# Patient Record
Sex: Female | Born: 1970
Health system: Southern US, Community
[De-identification: ages and names within clinical notes are randomized; demographics above are authoritative.]

## PROBLEM LIST (undated history)

## (undated) DIAGNOSIS — T7840XA Allergy, unspecified, initial encounter: Secondary | ICD-10-CM

## (undated) DIAGNOSIS — N3944 Nocturnal enuresis: Secondary | ICD-10-CM

## (undated) DIAGNOSIS — F99 Mental disorder, not otherwise specified: Secondary | ICD-10-CM

## (undated) DIAGNOSIS — Z5189 Encounter for other specified aftercare: Secondary | ICD-10-CM

## (undated) DIAGNOSIS — IMO0001 Reserved for inherently not codable concepts without codable children: Secondary | ICD-10-CM

## (undated) DIAGNOSIS — F319 Bipolar disorder, unspecified: Secondary | ICD-10-CM

## (undated) DIAGNOSIS — J45909 Unspecified asthma, uncomplicated: Secondary | ICD-10-CM

## (undated) DIAGNOSIS — M1990 Unspecified osteoarthritis, unspecified site: Secondary | ICD-10-CM

## (undated) DIAGNOSIS — F419 Anxiety disorder, unspecified: Secondary | ICD-10-CM

## (undated) DIAGNOSIS — K859 Acute pancreatitis without necrosis or infection, unspecified: Secondary | ICD-10-CM

## (undated) DIAGNOSIS — N390 Urinary tract infection, site not specified: Secondary | ICD-10-CM

## (undated) DIAGNOSIS — R51 Headache: Secondary | ICD-10-CM

## (undated) DIAGNOSIS — J40 Bronchitis, not specified as acute or chronic: Secondary | ICD-10-CM

## (undated) DIAGNOSIS — F32A Depression, unspecified: Secondary | ICD-10-CM

## (undated) DIAGNOSIS — G4730 Sleep apnea, unspecified: Secondary | ICD-10-CM

## (undated) DIAGNOSIS — A4902 Methicillin resistant Staphylococcus aureus infection, unspecified site: Secondary | ICD-10-CM

## (undated) DIAGNOSIS — J069 Acute upper respiratory infection, unspecified: Secondary | ICD-10-CM

## (undated) DIAGNOSIS — L509 Urticaria, unspecified: Secondary | ICD-10-CM

## (undated) DIAGNOSIS — K219 Gastro-esophageal reflux disease without esophagitis: Secondary | ICD-10-CM

## (undated) DIAGNOSIS — N12 Tubulo-interstitial nephritis, not specified as acute or chronic: Secondary | ICD-10-CM

## (undated) DIAGNOSIS — L309 Dermatitis, unspecified: Secondary | ICD-10-CM

## (undated) DIAGNOSIS — N189 Chronic kidney disease, unspecified: Secondary | ICD-10-CM

## (undated) DIAGNOSIS — R0602 Shortness of breath: Secondary | ICD-10-CM

## (undated) DIAGNOSIS — D571 Sickle-cell disease without crisis: Secondary | ICD-10-CM

## (undated) DIAGNOSIS — F329 Major depressive disorder, single episode, unspecified: Secondary | ICD-10-CM

## (undated) DIAGNOSIS — I10 Essential (primary) hypertension: Secondary | ICD-10-CM

## (undated) DIAGNOSIS — F1910 Other psychoactive substance abuse, uncomplicated: Secondary | ICD-10-CM

## (undated) DIAGNOSIS — F1020 Alcohol dependence, uncomplicated: Secondary | ICD-10-CM

## (undated) DIAGNOSIS — D573 Sickle-cell trait: Secondary | ICD-10-CM

## (undated) HISTORY — PX: TUBAL LIGATION: SHX77

## (undated) HISTORY — DX: Unspecified osteoarthritis, unspecified site: M19.90

## (undated) HISTORY — DX: Allergy, unspecified, initial encounter: T78.40XA

## (undated) HISTORY — DX: Essential (primary) hypertension: I10

## (undated) HISTORY — DX: Nocturnal enuresis: N39.44

## (undated) HISTORY — DX: Urticaria, unspecified: L50.9

## (undated) HISTORY — PX: UPPER GASTROINTESTINAL ENDOSCOPY: SHX188

## (undated) HISTORY — DX: Acute pancreatitis without necrosis or infection, unspecified: K85.90

## (undated) HISTORY — PX: COLONOSCOPY: SHX174

## (undated) HISTORY — DX: Sickle-cell disease without crisis: D57.1

## (undated) HISTORY — DX: Unspecified asthma, uncomplicated: J45.909

## (undated) HISTORY — DX: Alcohol dependence, uncomplicated: F10.20

## (undated) HISTORY — DX: Other psychoactive substance abuse, uncomplicated: F19.10

## (undated) HISTORY — DX: Dermatitis, unspecified: L30.9

---

## 1998-02-06 ENCOUNTER — Emergency Department (HOSPITAL_COMMUNITY): Admission: EM | Admit: 1998-02-06 | Discharge: 1998-02-06 | Payer: Self-pay | Admitting: Emergency Medicine

## 2000-05-30 ENCOUNTER — Encounter: Payer: Self-pay | Admitting: Emergency Medicine

## 2000-05-30 ENCOUNTER — Emergency Department (HOSPITAL_COMMUNITY): Admission: EM | Admit: 2000-05-30 | Discharge: 2000-05-30 | Payer: Self-pay | Admitting: Emergency Medicine

## 2002-03-10 ENCOUNTER — Emergency Department (HOSPITAL_COMMUNITY): Admission: EM | Admit: 2002-03-10 | Discharge: 2002-03-10 | Payer: Self-pay | Admitting: Emergency Medicine

## 2002-03-15 ENCOUNTER — Inpatient Hospital Stay (HOSPITAL_COMMUNITY): Admission: EM | Admit: 2002-03-15 | Discharge: 2002-03-18 | Payer: Self-pay | Admitting: Psychiatry

## 2002-11-19 ENCOUNTER — Emergency Department (HOSPITAL_COMMUNITY): Admission: EM | Admit: 2002-11-19 | Discharge: 2002-11-20 | Payer: Self-pay | Admitting: Emergency Medicine

## 2002-12-10 ENCOUNTER — Emergency Department (HOSPITAL_COMMUNITY): Admission: EM | Admit: 2002-12-10 | Discharge: 2002-12-10 | Payer: Self-pay | Admitting: Emergency Medicine

## 2003-02-13 ENCOUNTER — Emergency Department (HOSPITAL_COMMUNITY): Admission: EM | Admit: 2003-02-13 | Discharge: 2003-02-13 | Payer: Self-pay | Admitting: Emergency Medicine

## 2003-03-06 ENCOUNTER — Emergency Department (HOSPITAL_COMMUNITY): Admission: AC | Admit: 2003-03-06 | Discharge: 2003-03-06 | Payer: Self-pay

## 2004-05-16 ENCOUNTER — Emergency Department (HOSPITAL_COMMUNITY): Admission: EM | Admit: 2004-05-16 | Discharge: 2004-05-16 | Payer: Self-pay | Admitting: Emergency Medicine

## 2008-07-14 ENCOUNTER — Emergency Department (HOSPITAL_COMMUNITY): Admission: EM | Admit: 2008-07-14 | Discharge: 2008-07-15 | Payer: Self-pay | Admitting: Emergency Medicine

## 2008-07-29 ENCOUNTER — Emergency Department (HOSPITAL_COMMUNITY): Admission: EM | Admit: 2008-07-29 | Discharge: 2008-07-29 | Payer: Self-pay | Admitting: Emergency Medicine

## 2008-08-23 ENCOUNTER — Emergency Department (HOSPITAL_COMMUNITY): Admission: EM | Admit: 2008-08-23 | Discharge: 2008-08-23 | Payer: Self-pay | Admitting: Emergency Medicine

## 2008-09-25 ENCOUNTER — Emergency Department (HOSPITAL_COMMUNITY): Admission: EM | Admit: 2008-09-25 | Discharge: 2008-09-25 | Payer: Self-pay | Admitting: Emergency Medicine

## 2009-03-05 DIAGNOSIS — A4902 Methicillin resistant Staphylococcus aureus infection, unspecified site: Secondary | ICD-10-CM

## 2009-03-05 HISTORY — DX: Methicillin resistant Staphylococcus aureus infection, unspecified site: A49.02

## 2009-05-20 ENCOUNTER — Ambulatory Visit (HOSPITAL_BASED_OUTPATIENT_CLINIC_OR_DEPARTMENT_OTHER): Admission: RE | Admit: 2009-05-20 | Discharge: 2009-05-20 | Payer: Self-pay | Admitting: Internal Medicine

## 2009-05-31 ENCOUNTER — Ambulatory Visit: Payer: Self-pay | Admitting: Internal Medicine

## 2009-08-19 ENCOUNTER — Emergency Department (HOSPITAL_COMMUNITY): Admission: EM | Admit: 2009-08-19 | Discharge: 2009-08-19 | Payer: Self-pay | Admitting: Emergency Medicine

## 2010-02-08 ENCOUNTER — Encounter
Admission: RE | Admit: 2010-02-08 | Discharge: 2010-02-08 | Payer: Self-pay | Source: Home / Self Care | Admitting: Internal Medicine

## 2010-03-30 ENCOUNTER — Emergency Department (HOSPITAL_COMMUNITY)
Admission: EM | Admit: 2010-03-30 | Discharge: 2010-03-30 | Payer: Self-pay | Source: Home / Self Care | Admitting: Emergency Medicine

## 2010-03-30 LAB — RAPID STREP SCREEN (MED CTR MEBANE ONLY): Streptococcus, Group A Screen (Direct): NEGATIVE

## 2010-05-21 LAB — URINE MICROSCOPIC-ADD ON

## 2010-05-21 LAB — DIFFERENTIAL
Basophils Absolute: 0 10*3/uL (ref 0.0–0.1)
Basophils Relative: 0 % (ref 0–1)
Eosinophils Absolute: 0 10*3/uL (ref 0.0–0.7)
Eosinophils Relative: 0 % (ref 0–5)
Lymphocytes Relative: 10 % — ABNORMAL LOW (ref 12–46)
Lymphs Abs: 1.5 10*3/uL (ref 0.7–4.0)
Monocytes Absolute: 1.3 10*3/uL — ABNORMAL HIGH (ref 0.1–1.0)
Monocytes Relative: 9 % (ref 3–12)
Neutro Abs: 11.8 10*3/uL — ABNORMAL HIGH (ref 1.7–7.7)
Neutrophils Relative %: 81 % — ABNORMAL HIGH (ref 43–77)

## 2010-05-21 LAB — POCT I-STAT, CHEM 8
BUN: 5 mg/dL — ABNORMAL LOW (ref 6–23)
Calcium, Ion: 1.11 mmol/L — ABNORMAL LOW (ref 1.12–1.32)
Chloride: 104 mEq/L (ref 96–112)
Creatinine, Ser: 0.9 mg/dL (ref 0.4–1.2)
Glucose, Bld: 98 mg/dL (ref 70–99)
HCT: 42 % (ref 36.0–46.0)
Hemoglobin: 14.3 g/dL (ref 12.0–15.0)
Potassium: 3.6 mEq/L (ref 3.5–5.1)
Sodium: 136 mEq/L (ref 135–145)
TCO2: 24 mmol/L (ref 0–100)

## 2010-05-21 LAB — WET PREP, GENITAL
Trich, Wet Prep: NONE SEEN
Yeast Wet Prep HPF POC: NONE SEEN

## 2010-05-21 LAB — CBC
HCT: 40.2 % (ref 36.0–46.0)
Hemoglobin: 13.5 g/dL (ref 12.0–15.0)
MCHC: 33.5 g/dL (ref 30.0–36.0)
MCV: 89.2 fL (ref 78.0–100.0)
Platelets: 246 10*3/uL (ref 150–400)
RBC: 4.51 MIL/uL (ref 3.87–5.11)
RDW: 14.5 % (ref 11.5–15.5)
WBC: 14.6 10*3/uL — ABNORMAL HIGH (ref 4.0–10.5)

## 2010-05-21 LAB — URINALYSIS, ROUTINE W REFLEX MICROSCOPIC
Glucose, UA: NEGATIVE mg/dL
Ketones, ur: 15 mg/dL — AB
Nitrite: POSITIVE — AB
Protein, ur: 30 mg/dL — AB
Specific Gravity, Urine: 1.02 (ref 1.005–1.030)
Urobilinogen, UA: 4 mg/dL — ABNORMAL HIGH (ref 0.0–1.0)
pH: 6 (ref 5.0–8.0)

## 2010-05-21 LAB — GC/CHLAMYDIA PROBE AMP, GENITAL
Chlamydia, DNA Probe: NEGATIVE
GC Probe Amp, Genital: NEGATIVE

## 2010-05-21 LAB — POCT PREGNANCY, URINE: Preg Test, Ur: NEGATIVE

## 2010-06-11 LAB — WOUND CULTURE

## 2010-06-13 LAB — URINALYSIS, ROUTINE W REFLEX MICROSCOPIC
Bilirubin Urine: NEGATIVE
Glucose, UA: NEGATIVE mg/dL
Hgb urine dipstick: NEGATIVE
Ketones, ur: 15 mg/dL — AB
Nitrite: NEGATIVE
Protein, ur: NEGATIVE mg/dL
Specific Gravity, Urine: 1.03 (ref 1.005–1.030)
Urobilinogen, UA: 1 mg/dL (ref 0.0–1.0)
pH: 7 (ref 5.0–8.0)

## 2010-06-13 LAB — URINE CULTURE: Colony Count: 35000

## 2010-06-13 LAB — PREGNANCY, URINE: Preg Test, Ur: NEGATIVE

## 2010-06-13 LAB — URINE MICROSCOPIC-ADD ON

## 2010-07-21 NOTE — Discharge Summary (Signed)
NAME:  [name], [name] NO.:  [id number]   MEDICAL RECORD NO.:  [id number]                   PATIENT TYPE:  IPS   LOCATION:  0503                                 FACILITY:  [facility]   PHYSICIAN:  Geoffery Lyons, M.D.                   DATE OF BIRTH:  [birth year]   DATE OF ADMISSION:  03/15/2002  DATE OF DISCHARGE:  03/18/2002                                 DISCHARGE SUMMARY   HISTORY OF PRESENT ILLNESS:  This was the first admission to the [facility] for this 40 year old single [place]  American female. She was voluntarily admitted with a history of intentional  overdose taking 10 Strattera which were prescribed for her son. She  apparently was by herself and then changed her mind and called her cousin  who called her mother and took her to the emergency room. Her stressors were  having to move, pain, in debt and decreased sleep, irritability, she said  she was unable to cope.   PAST PSYCHIATRIC HISTORY:  First time in at [facility].  Currently at [facility] for outpatient treatment.   ALCOHOL AND DRUG HISTORY:  Denies the use or abuse of any substances. 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   PAST MEDICAL HISTORY:  Folliculitis.   MEDICATIONS:  Keflex 250 q.i.d.   PHYSICAL EXAMINATION:  No acute findings.   MENTAL STATUS EXAM:  This is an alert, disheveled, overweight African  American female, cooperative. Good eye contact. Speech clear. Goal directed.  Mood depressed, affect flat. Thought process coherent. No evidence of  delusional idea. No auditory or visual hallucinations. No suicidal or  homicidal ideations.   DIAGNOSES:   AXIS I:  Depressive disorder, not otherwise specified.   AXIS II:  No diagnosis.   AXIS III:  Folliculitis.   AXIS IV:  Moderate.   AXIS V:  Global assessment of functioning on admission 60.   HOSPITAL COURSE:  She admitted to me that she had been  feeling increasingly  more depressed and stressed out, and she had made an intake appointment with  the [facility]. Latest stressors, she was recently  released from prison and got her kids back. She says that she had intended  to  die. There was also something having to do with relations with a fiance.  Depressed, unemployed, little support, family conflict.   She was started on Lexapro and worked on Optician, dispensing. There was a family session with the boyfriend. It went well.   She continued to improve as we increased the medication and she continued to  talk and work on Pharmacologist. On January 14, she was in good contact with  reality, was better with the stressors, better coping skills, stress  management. The antidepressant was helping and she was willing and motivated  to pursue further outpatient treatment upon discharge. Her mood improved,  affect brighter, no suicidal or homicidal ideations.   DISCHARGE DIAGNOSES:   AXIS I:  Depressive disorder, not otherwise specified.   AXIS II:  No diagnosis.   AXIS III:  Folliculitis.   AXIS IV:  Moderate.   AXIS V:  Upon discharge 55, 60.   DISCHARGE MEDICATIONS:  1. Lexapro 10 mg q.d.  2. Ambien 10 q.h.s. for sleep.   FOLLOW UP:  She will follow up with [facility].                                               Geoffery Lyons, M.D.    IL/MEDQ  D:  04/16/2002  T:  04/16/2002  Job:  [id number]

## 2010-07-21 NOTE — H&P (Signed)
NAME:  [name], [name]                        ACCOUNT NO.:  [id number]   MEDICAL RECORD NO.:  [id number]                   PATIENT TYPE:  IPS   LOCATION:  0503                                 FACILITY:  [facility]   PHYSICIAN:  Geoffery Lyons, M.D.                   DATE OF BIRTH:  1971/02/03   DATE OF ADMISSION:  03/15/2002  DATE OF DISCHARGE:                         PSYCHIATRIC ADMISSION ASSESSMENT   IDENTIFYING INFORMATION:  A 41 year old single African-American female  voluntarily admitted on March 15, 2002.   HISTORY OF PRESENT ILLNESS:  The patient presents with a history of  intentional overdose, taking approximately 10 Strattera tablets.  It was her  son's medications.  Took it at home.  She states no one was there.  She did  it to make herself less stressed.  She states that it was not a suicide  attempt.  She did call her cousins who then called her mother, then the  patient was taken to the emergency department.  The patient stressed over  having to move.  She is now evicted and concerned about paying deposits for  upcoming housing.  Her sleep has been decreased.  Her appetite has been  fair.  She reports mood swings described as irritability, no evidence of  hypomania episodes.  Continues to feel very depressed.  Has an upcoming  appointment with mental health.  She currently denies any psychotic  symptoms.   PAST PSYCHIATRIC HISTORY:  First hospitalization [facility],  no history of a suicide attempt.  Has an appointment at [facility] on January 28.   SOCIAL HISTORY:  A 40 year old single African-American female with 4  children, 16, 15, 55, and 64.  Children are currently living with a cousin  while she is hospitalized.  The patient was released from prison on October 14, 2001, for forgery, was in prison for 1 year.  She is on probation for 3  years.   FAMILY HISTORY:  None.   ALCOHOL DRUG HISTORY:  The patient smokes.  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   PAST MEDICAL HISTORY:  Primary care Relena Ivancic is none.  Medical problems  folliculitis.   MEDICATIONS:  Keflex 250 mg q.i.d., on a 7 day course.   DRUG ALLERGIES:  PENICILLIN.   PHYSICAL EXAMINATION:  Performed at [facility].  The patient appears as an  overweight female in no acute distress.  Her vital signs 97.3, 95 heart  rate, blood pressure is 135/77.  She is 5 feet 7 inches tall, she is 214  pounds.   LABORATORY DATA:  Urine drug screen was negative.  Hemoglobin 16, hematocrit  47.  Her CMET is within normal limits.  Her acetaminophen level less than  10, salicylate level is less than 4.   MENTAL STATUS EXAM:  She is an alert,  disheveled, overweight African-  American female.  She is cooperative, with good eye contact.  Speech is  clear, mood is depressed, affect is flat.  Thought processes are coherent.  There is no evidence of psychosis, no auditory or visual hallucinations,  suicidal or homicidal ideation.  Cognitive function intact.  Memory is fair,  judgment and insight is fair.    ADMISSION DIAGNOSES:   AXIS I:  Depressive disorder not otherwise specified.   AXIS II:  Deferred.   AXIS III:  Folliculitis.   AXIS IV:  Problems with primary support group, housing, and other  psychosocial problems.   AXIS V:  Current is 30, this past year 67.   PLAN:  Voluntary admission for intentional overdose.  Contract for safety,  check every 15 minutes.  Will initiate an antidepressant.  Medication  compliance was discussed with the patient.  The patient is to increase  coping skills by attending groups.  Stabilize mood and thinking so the  patient can be safe, to follow up with mental health.   TENTATIVE LENGTH OF CARE:  3-4 days.      Landry Corporal, N.P.                       Geoffery Lyons, M.D.    JO/MEDQ  D:  03/18/2002  T:  03/18/2002  Job:  [id number]

## 2010-08-03 ENCOUNTER — Emergency Department (HOSPITAL_COMMUNITY)
Admission: EM | Admit: 2010-08-03 | Discharge: 2010-08-03 | Disposition: A | Discharge status: Home / Self Care | Payer: Self-pay | Attending: Emergency Medicine | Admitting: Emergency Medicine

## 2010-08-03 DIAGNOSIS — F17200 Nicotine dependence, unspecified, uncomplicated: Secondary | ICD-10-CM | POA: Insufficient documentation

## 2010-08-03 DIAGNOSIS — R599 Enlarged lymph nodes, unspecified: Secondary | ICD-10-CM | POA: Insufficient documentation

## 2010-08-03 DIAGNOSIS — IMO0001 Reserved for inherently not codable concepts without codable children: Secondary | ICD-10-CM | POA: Insufficient documentation

## 2010-08-03 DIAGNOSIS — R070 Pain in throat: Secondary | ICD-10-CM | POA: Insufficient documentation

## 2010-08-03 DIAGNOSIS — R6889 Other general symptoms and signs: Secondary | ICD-10-CM | POA: Insufficient documentation

## 2010-08-03 DIAGNOSIS — J40 Bronchitis, not specified as acute or chronic: Secondary | ICD-10-CM | POA: Insufficient documentation

## 2010-08-03 DIAGNOSIS — J3489 Other specified disorders of nose and nasal sinuses: Secondary | ICD-10-CM | POA: Insufficient documentation

## 2010-08-03 DIAGNOSIS — R05 Cough: Secondary | ICD-10-CM | POA: Insufficient documentation

## 2010-08-03 DIAGNOSIS — F319 Bipolar disorder, unspecified: Secondary | ICD-10-CM | POA: Insufficient documentation

## 2010-08-03 DIAGNOSIS — R059 Cough, unspecified: Secondary | ICD-10-CM | POA: Insufficient documentation

## 2010-08-13 ENCOUNTER — Emergency Department (HOSPITAL_COMMUNITY)
Admission: EM | Admit: 2010-08-13 | Discharge: 2010-08-14 | Discharge status: Against Medical Advice | Payer: Self-pay | Attending: Emergency Medicine | Admitting: Emergency Medicine

## 2010-08-13 DIAGNOSIS — R109 Unspecified abdominal pain: Secondary | ICD-10-CM | POA: Insufficient documentation

## 2010-08-13 DIAGNOSIS — M549 Dorsalgia, unspecified: Secondary | ICD-10-CM | POA: Insufficient documentation

## 2010-08-14 ENCOUNTER — Emergency Department (HOSPITAL_COMMUNITY)
Admission: EM | Admit: 2010-08-14 | Discharge: 2010-08-14 | Disposition: A | Discharge status: Home / Self Care | Payer: Self-pay | Attending: Emergency Medicine | Admitting: Emergency Medicine

## 2010-08-14 ENCOUNTER — Emergency Department (HOSPITAL_COMMUNITY): Payer: Self-pay

## 2010-08-14 DIAGNOSIS — N898 Other specified noninflammatory disorders of vagina: Secondary | ICD-10-CM | POA: Insufficient documentation

## 2010-08-14 DIAGNOSIS — Z79899 Other long term (current) drug therapy: Secondary | ICD-10-CM | POA: Insufficient documentation

## 2010-08-14 DIAGNOSIS — R1915 Other abnormal bowel sounds: Secondary | ICD-10-CM | POA: Insufficient documentation

## 2010-08-14 DIAGNOSIS — F3130 Bipolar disorder, current episode depressed, mild or moderate severity, unspecified: Secondary | ICD-10-CM | POA: Insufficient documentation

## 2010-08-14 DIAGNOSIS — R109 Unspecified abdominal pain: Secondary | ICD-10-CM | POA: Insufficient documentation

## 2010-08-14 DIAGNOSIS — R110 Nausea: Secondary | ICD-10-CM | POA: Insufficient documentation

## 2010-08-14 LAB — DIFFERENTIAL
Basophils Absolute: 0 10*3/uL (ref 0.0–0.1)
Basophils Relative: 1 % (ref 0–1)
Eosinophils Absolute: 0.2 10*3/uL (ref 0.0–0.7)
Eosinophils Relative: 2 % (ref 0–5)
Lymphocytes Relative: 48 % — ABNORMAL HIGH (ref 12–46)
Lymphs Abs: 3.3 10*3/uL (ref 0.7–4.0)
Monocytes Absolute: 0.4 10*3/uL (ref 0.1–1.0)
Monocytes Relative: 5 % (ref 3–12)
Neutro Abs: 3 10*3/uL (ref 1.7–7.7)
Neutrophils Relative %: 44 % (ref 43–77)

## 2010-08-14 LAB — CBC
HCT: 39.8 % (ref 36.0–46.0)
Hemoglobin: 13.6 g/dL (ref 12.0–15.0)
MCH: 28.9 pg (ref 26.0–34.0)
MCHC: 34.2 g/dL (ref 30.0–36.0)
MCV: 84.7 fL (ref 78.0–100.0)
Platelets: 302 10*3/uL (ref 150–400)
RBC: 4.7 MIL/uL (ref 3.87–5.11)
RDW: 13.1 % (ref 11.5–15.5)
WBC: 6.8 10*3/uL (ref 4.0–10.5)

## 2010-08-14 LAB — URINALYSIS, ROUTINE W REFLEX MICROSCOPIC
Bilirubin Urine: NEGATIVE
Glucose, UA: NEGATIVE mg/dL
Hgb urine dipstick: NEGATIVE
Ketones, ur: NEGATIVE mg/dL
Leukocytes, UA: NEGATIVE
Nitrite: NEGATIVE
Protein, ur: NEGATIVE mg/dL
Specific Gravity, Urine: 1.025 (ref 1.005–1.030)
Urobilinogen, UA: 1 mg/dL (ref 0.0–1.0)
pH: 5.5 (ref 5.0–8.0)

## 2010-08-14 LAB — BASIC METABOLIC PANEL
BUN: 7 mg/dL (ref 6–23)
CO2: 26 mEq/L (ref 19–32)
Calcium: 8.8 mg/dL (ref 8.4–10.5)
Chloride: 108 mEq/L (ref 96–112)
Creatinine, Ser: 0.76 mg/dL (ref 0.4–1.2)
GFR calc Af Amer: >60 mL/min (ref >60)
GFR calc non Af Amer: >60 mL/min (ref >60)
Glucose, Bld: 104 mg/dL — ABNORMAL HIGH (ref 70–99)
Potassium: 3.5 mEq/L (ref 3.5–5.1)
Sodium: 141 mEq/L (ref 135–145)

## 2010-08-14 LAB — WET PREP, GENITAL
Trich, Wet Prep: NONE SEEN
Yeast Wet Prep HPF POC: NONE SEEN

## 2010-08-14 LAB — POCT PREGNANCY, URINE: Preg Test, Ur: NEGATIVE

## 2010-08-15 LAB — GC/CHLAMYDIA PROBE AMP, GENITAL
Chlamydia, DNA Probe: NEGATIVE
GC Probe Amp, Genital: NEGATIVE

## 2010-10-03 ENCOUNTER — Emergency Department (HOSPITAL_COMMUNITY)
Admission: EM | Admit: 2010-10-03 | Discharge: 2010-10-03 | Disposition: A | Discharge status: Home / Self Care | Payer: Self-pay | Attending: Emergency Medicine | Admitting: Emergency Medicine

## 2010-10-03 DIAGNOSIS — F3130 Bipolar disorder, current episode depressed, mild or moderate severity, unspecified: Secondary | ICD-10-CM | POA: Insufficient documentation

## 2010-10-03 DIAGNOSIS — R3911 Hesitancy of micturition: Secondary | ICD-10-CM | POA: Insufficient documentation

## 2010-10-03 DIAGNOSIS — M545 Low back pain, unspecified: Secondary | ICD-10-CM | POA: Insufficient documentation

## 2010-10-03 DIAGNOSIS — R300 Dysuria: Secondary | ICD-10-CM | POA: Insufficient documentation

## 2010-10-03 DIAGNOSIS — Z79899 Other long term (current) drug therapy: Secondary | ICD-10-CM | POA: Insufficient documentation

## 2010-10-03 DIAGNOSIS — R3915 Urgency of urination: Secondary | ICD-10-CM | POA: Insufficient documentation

## 2010-10-03 DIAGNOSIS — R350 Frequency of micturition: Secondary | ICD-10-CM | POA: Insufficient documentation

## 2010-10-03 LAB — URINALYSIS, ROUTINE W REFLEX MICROSCOPIC
Bilirubin Urine: NEGATIVE
Glucose, UA: NEGATIVE mg/dL
Hgb urine dipstick: NEGATIVE
Ketones, ur: NEGATIVE mg/dL
Nitrite: NEGATIVE
Protein, ur: NEGATIVE mg/dL
Specific Gravity, Urine: 1.02 (ref 1.005–1.030)
Urobilinogen, UA: 0.2 mg/dL (ref 0.0–1.0)
pH: 5.5 (ref 5.0–8.0)

## 2010-10-03 LAB — URINE MICROSCOPIC-ADD ON

## 2010-10-03 LAB — PREGNANCY, URINE: Preg Test, Ur: NEGATIVE

## 2010-10-04 DIAGNOSIS — N12 Tubulo-interstitial nephritis, not specified as acute or chronic: Secondary | ICD-10-CM

## 2010-10-04 DIAGNOSIS — N390 Urinary tract infection, site not specified: Secondary | ICD-10-CM

## 2010-10-04 HISTORY — DX: Urinary tract infection, site not specified: N39.0

## 2010-10-04 HISTORY — DX: Tubulo-interstitial nephritis, not specified as acute or chronic: N12

## 2010-10-04 LAB — URINE CULTURE
Colony Count: NO GROWTH
Culture  Setup Time: 201207311434
Culture: NO GROWTH

## 2011-01-19 ENCOUNTER — Emergency Department (HOSPITAL_COMMUNITY)
Admission: EM | Admit: 2011-01-19 | Discharge: 2011-01-19 | Disposition: A | Discharge status: Home / Self Care | Payer: Self-pay | Attending: Emergency Medicine | Admitting: Emergency Medicine

## 2011-01-19 DIAGNOSIS — X58XXXA Exposure to other specified factors, initial encounter: Secondary | ICD-10-CM | POA: Insufficient documentation

## 2011-01-19 DIAGNOSIS — S0502XA Injury of conjunctiva and corneal abrasion without foreign body, left eye, initial encounter: Secondary | ICD-10-CM

## 2011-01-19 DIAGNOSIS — H5789 Other specified disorders of eye and adnexa: Secondary | ICD-10-CM | POA: Insufficient documentation

## 2011-01-19 DIAGNOSIS — S058X9A Other injuries of unspecified eye and orbit, initial encounter: Secondary | ICD-10-CM | POA: Insufficient documentation

## 2011-01-19 DIAGNOSIS — H5710 Ocular pain, unspecified eye: Secondary | ICD-10-CM | POA: Insufficient documentation

## 2011-01-19 HISTORY — DX: Bronchitis, not specified as acute or chronic: J40

## 2011-01-19 MED ORDER — ERYTHROMYCIN 2 % EX OINT: TOPICAL_OINTMENT | CUTANEOUS | Status: DC

## 2011-01-19 MED ORDER — HYDROCODONE-ACETAMINOPHEN 5-325 MG PO TABS: 1.0000 | ORAL_TABLET | ORAL | Status: AC | PRN

## 2011-01-19 MED ORDER — TETRACAINE HCL 0.5 % OP SOLN
1.0000 [drp] | Freq: Once | OPHTHALMIC | Status: AC
  Administered 2011-01-19: 1 [drp] via OPHTHALMIC
  Filled 2011-01-19: qty 2

## 2011-01-19 MED ORDER — FLUORESCEIN SODIUM 1 MG OP STRP
1.0000 | ORAL_STRIP | Freq: Once | OPHTHALMIC | Status: AC
  Administered 2011-01-19: 1 via OPHTHALMIC
  Filled 2011-01-19: qty 1

## 2011-01-19 NOTE — ED Notes (Signed)
Pt verbalized understanding of written  discharge instructions and follow up appointment. Given prescriptions. Left before e signature obtained.

## 2011-01-19 NOTE — ED Notes (Signed)
Pt discharged at 1445; discharge notes written later.

## 2011-01-19 NOTE — ED Provider Notes (Signed)
History     CSN: [id number] Arrival date & time: 01/19/2011  9:50 AM   First MD Initiated Contact with Patient 01/19/11 1109      Chief Complaint  Patient presents with  . Eye Pain   HPI Patient presents to emergency room with complaint of left eye redness and pain that started this morning. Patient reports that she does not wear contact lenses. Denies any trauma or injury to the eye. Patient denies any headaches. Denies any diplopia. Denies any other complaints.   Past Medical History  Diagnosis Date  . Bronchitis     Past Surgical History  Procedure Date  . Cesarean section     History reviewed. No pertinent family history.  History  Substance Use Topics  . Smoking status: Current Everyday Smoker  . Smokeless tobacco: Not on file  . Alcohol Use: Yes    OB History    Grav Para Term Preterm Abortions TAB SAB Ect Mult Living                  Review of Systems  Constitutional: Negative for fever, chills, diaphoresis and appetite change.  HENT: Negative for neck pain.   Eyes: Positive for photophobia, pain and redness. Negative for discharge, itching and visual disturbance.  Respiratory: Negative for cough, chest tightness and shortness of breath.   Cardiovascular: Negative for chest pain.  Gastrointestinal: Negative for nausea, vomiting and abdominal pain.  Genitourinary: Negative for flank pain.  Musculoskeletal: Negative for back pain.  Skin: Negative for rash.  Neurological: Negative for weakness and numbness.  All other systems reviewed and are negative.    Allergies  Aspirin; Penicillins; and Septra  Home Medications   Current Outpatient Rx  Name Route Sig Dispense Refill  . LAMOTRIGINE 100 MG PO TABS Oral Take 100 mg by mouth daily.      . TRAZODONE HCL 100 MG PO TABS Oral Take 50 mg by mouth at bedtime.      Marland Kitchen ZOLPIDEM TARTRATE 10 MG PO TABS Oral Take 10 mg by mouth at bedtime.        BP 123/60  Pulse 74  Temp(Src) 98.4 F (36.9 C) (Oral)   Resp 22  SpO2 98%  LMP 12/20/2010  Physical Exam  Nursing note and vitals reviewed. Constitutional: She appears well-developed and well-nourished. No distress.  HENT:  Head: Normocephalic and atraumatic.  Eyes: EOM are normal. Pupils are equal, round, and reactive to light. Right eye exhibits no discharge. Left eye exhibits no discharge. Right conjunctiva is not injected. Left conjunctiva is injected. Left conjunctiva has no hemorrhage. No scleral icterus. Right eye exhibits normal extraocular motion and no nystagmus. Left eye exhibits normal extraocular motion and no nystagmus. Right pupil is round and reactive. Left pupil is round and reactive. Pupils are equal.    Skin: She is not diaphoretic.    ED Course  Procedures (including critical care time)  Patient seen and evaluated.  VSS reviewed. . Nursing notes reviewed.  Initial testing ordered. Will monitor the patient closely. They agree with the treatment plan and diagnosis. Fluorescein and propocaine applied, with woods lamp. Showed corneal abrasion. No foreign body seen.  2:03 PM patient to be discharged. Stressed importance of following up with an opthalmologic and warning signs to return. Stated agreement and understanding. No consensual eye pain.  MDM  Left corneal abrasion        Demetrius Charity, PA 01/19/11 1404

## 2011-01-19 NOTE — ED Provider Notes (Signed)
Medical screening examination/treatment/procedure(s) were performed by non-physician practitioner and as supervising physician I was immediately available for consultation/collaboration.  Ethelda Chick, MD 01/19/11 [id number]

## 2011-01-19 NOTE — ED Provider Notes (Signed)
Patient is a 40 year old female who presents for evaluation of left eye redness and irritation and reporting that "it feels like something is in my". She will cut this morning with a red and injected left eye with some drainage, but no known trauma to the. She denies any other symptoms such as severe pain, nasal congestion, sore throat, cough, or fever. She denies any known sick contacts. On examination her left eye shows injected conjunctiva with some tearing. Grossly examining the eye there is no apparent foreign body. The patient will however need to be moved back to the eye exam for more detailed examination and evaluation of the.  Felisa Bonier, MD 01/19/11 1121

## 2011-01-19 NOTE — ED Notes (Signed)
Patient presents with redness and pain to left eye since this AM with swelling. Patient also reporting blurred vision.

## 2011-02-25 ENCOUNTER — Emergency Department (HOSPITAL_COMMUNITY)
Admission: EM | Admit: 2011-02-25 | Discharge: 2011-02-26 | Disposition: A | Discharge status: Home / Self Care | Payer: Self-pay | Attending: Emergency Medicine | Admitting: Emergency Medicine

## 2011-02-25 DIAGNOSIS — M25469 Effusion, unspecified knee: Secondary | ICD-10-CM | POA: Insufficient documentation

## 2011-02-25 DIAGNOSIS — Z79899 Other long term (current) drug therapy: Secondary | ICD-10-CM | POA: Insufficient documentation

## 2011-02-25 DIAGNOSIS — M25569 Pain in unspecified knee: Secondary | ICD-10-CM | POA: Insufficient documentation

## 2011-02-25 DIAGNOSIS — R000 Tachycardia, unspecified: Secondary | ICD-10-CM | POA: Insufficient documentation

## 2011-02-25 DIAGNOSIS — R269 Unspecified abnormalities of gait and mobility: Secondary | ICD-10-CM | POA: Insufficient documentation

## 2011-02-25 DIAGNOSIS — Y92009 Unspecified place in unspecified non-institutional (private) residence as the place of occurrence of the external cause: Secondary | ICD-10-CM | POA: Insufficient documentation

## 2011-02-25 DIAGNOSIS — W010XXA Fall on same level from slipping, tripping and stumbling without subsequent striking against object, initial encounter: Secondary | ICD-10-CM | POA: Insufficient documentation

## 2011-02-25 DIAGNOSIS — K219 Gastro-esophageal reflux disease without esophagitis: Secondary | ICD-10-CM | POA: Insufficient documentation

## 2011-02-25 DIAGNOSIS — F411 Generalized anxiety disorder: Secondary | ICD-10-CM | POA: Insufficient documentation

## 2011-02-25 DIAGNOSIS — S82143A Displaced bicondylar fracture of unspecified tibia, initial encounter for closed fracture: Secondary | ICD-10-CM

## 2011-02-25 DIAGNOSIS — S82109A Unspecified fracture of upper end of unspecified tibia, initial encounter for closed fracture: Secondary | ICD-10-CM | POA: Insufficient documentation

## 2011-02-25 DIAGNOSIS — F319 Bipolar disorder, unspecified: Secondary | ICD-10-CM | POA: Insufficient documentation

## 2011-02-25 HISTORY — DX: Bipolar disorder, unspecified: F31.9

## 2011-02-25 HISTORY — DX: Sleep apnea, unspecified: G47.30

## 2011-02-25 HISTORY — DX: Gastro-esophageal reflux disease without esophagitis: K21.9

## 2011-02-26 ENCOUNTER — Emergency Department (HOSPITAL_COMMUNITY): Payer: Self-pay

## 2011-02-26 ENCOUNTER — Encounter (HOSPITAL_COMMUNITY): Payer: Self-pay | Admitting: *Deleted

## 2011-02-26 MED ORDER — ONDANSETRON 4 MG PO TBDP
4.0000 mg | ORAL_TABLET | Freq: Once | ORAL | Status: AC
Start: 2011-02-26 — End: 2011-02-26
  Administered 2011-02-26: 4 mg via ORAL
  Filled 2011-02-26: qty 1

## 2011-02-26 MED ORDER — OXYCODONE-ACETAMINOPHEN 5-325 MG PO TABS: 1.0000 | ORAL_TABLET | ORAL | Status: AC | PRN

## 2011-02-26 MED ORDER — OXYCODONE-ACETAMINOPHEN 5-325 MG PO TABS
2.0000 | ORAL_TABLET | Freq: Once | ORAL | Status: AC
  Administered 2011-02-26: 2 via ORAL
  Filled 2011-02-26: qty 2

## 2011-02-26 MED ORDER — HYDROMORPHONE HCL PF 2 MG/ML IJ SOLN
2.0000 mg | Freq: Once | INTRAMUSCULAR | Status: AC
  Administered 2011-02-26: 2 mg via INTRAMUSCULAR
  Filled 2011-02-26 (×2): qty 1

## 2011-02-26 NOTE — ED Provider Notes (Signed)
Medical screening examination/treatment/procedure(s) were performed by non-physician practitioner and as supervising physician I was immediately available for consultation/collaboration.   Ruairi Stutsman, MD 02/26/11 0656 

## 2011-02-26 NOTE — ED Notes (Addendum)
C/o leg pain, fell at ~ 2330, "just slipped, leg went behind her", pinpoints pain to just above L knee and down to mid tib./fib, pt crying & guarding. Pain rated at 10/10. No meds PTA. CMS intact. No obvious deformity.

## 2011-02-26 NOTE — ED Provider Notes (Signed)
History     CSN: [id number]  Arrival date & time 02/25/11  2334   First MD Initiated Contact with Patient 02/26/11 0057      Chief Complaint  Patient presents with  . Leg Pain  . Fall    (Consider location/radiation/quality/duration/timing/severity/associated sxs/prior treatment) HPI Comments: Patient states she was wearing her bedroom slippers walking in her home.  That has hardwood floors slipped left leg twisted behind her, now she's having pain from mid tib-fib through the knee to distal femur.  States she's unable to bear weight or to move her legs on her own.  This happened just prior to arrival, she's taken no home medications  Patient is a 40 y.o. female presenting with leg pain and fall. The history is provided by the patient.  Leg Pain  The incident occurred less than 1 hour ago. The incident occurred at home. The injury mechanism was a fall. The pain is present in the left leg. The quality of the pain is described as throbbing. The pain is at a severity of 10/10. The pain is severe. The pain has been constant since onset. Associated symptoms include inability to bear weight. Pertinent negatives include no numbness. The symptoms are aggravated by activity. She has tried nothing for the symptoms.  Fall Pertinent negatives include no numbness.    Past Medical History  Diagnosis Date  . Bronchitis   . Chronic bipolar disorder   . GERD (gastroesophageal reflux disease)   . Sleep apnea     Past Surgical History  Procedure Date  . Cesarean section   . Cesarean section   . Tubal ligation     Family History  Problem Relation Age of Onset  . Thyroid disease Father   . Diabetes Other   . Cancer Other     History  Substance Use Topics  . Smoking status: Current Everyday Smoker -- 0.5 packs/day  . Smokeless tobacco: Not on file  . Alcohol Use: Yes    OB History    Grav Para Term Preterm Abortions TAB SAB Ect Mult Living                  Review of Systems    HENT: Negative.   Eyes: Negative.   Respiratory: Negative.   Cardiovascular: Negative.   Gastrointestinal: Negative.   Genitourinary: Negative.   Musculoskeletal: Positive for joint swelling and gait problem. Negative for back pain.  Neurological: Negative for numbness.  Psychiatric/Behavioral: Negative.     Allergies  Aspirin; Penicillins; and Septra  Home Medications   Current Outpatient Rx  Name Route Sig Dispense Refill  . LAMOTRIGINE 100 MG PO TABS Oral Take 100 mg by mouth daily.      . TRAZODONE HCL 100 MG PO TABS Oral Take 50 mg by mouth at bedtime.      Marland Kitchen ZOLPIDEM TARTRATE 10 MG PO TABS Oral Take 10 mg by mouth at bedtime.      . OXYCODONE-ACETAMINOPHEN 5-325 MG PO TABS Oral Take 1 tablet by mouth every 4 (four) hours as needed for pain. 30 tablet 0    BP 111/54  Pulse 77  Temp 97.3 F (36.3 C)  Resp 20  SpO2 100%  LMP 01/21/2011  Physical Exam  Constitutional: She is oriented to person, place, and time. She appears well-developed and well-nourished.  HENT:  Head: Normocephalic.  Neck: Normal range of motion.  Cardiovascular: Tachycardia present.   Musculoskeletal:       Left knee: She exhibits decreased range  of motion and swelling. She exhibits no erythema. tenderness found.  Neurological: She is oriented to person, place, and time.  Skin: Skin is warm and dry.  Psychiatric: Her mood appears anxious.    ED Course  Procedures (including critical care time)  Labs Reviewed - No data to display Dg Tibia/fibula Left  02/26/2011  *RADIOLOGY REPORT*  Clinical Data: Trauma, fall, pain and swelling at knee  LEFT TIBIA AND FIBULA - 2 VIEW  Comparison: None  Findings: Ankle joint alignment normal. Depressed intra-articular fracture lateral tibial plateau. No additional fracture or dislocation identified.  IMPRESSION: Depressed left lateral tibial plateau fracture.  Original Report Authenticated By: Lollie Marrow, M.D.   Dg Knee Complete 4 Views  Left  02/26/2011  *RADIOLOGY REPORT*  Clinical Data: Pain and swelling, fall  LEFT KNEE - COMPLETE 4+ VIEW  Comparison: None.  Findings: Bones appear mildly demineralized. Comminuted depressed and displaced fracture of lateral tibial plateau. Widening of lateral compartment. No additional fracture, dislocation or bone destruction. Joint effusion with fat fluid level.  IMPRESSION: Depressed and displaced lateral tibial plateau fracture. Associated lipohemarthrosis.  Original Report Authenticated By: Lollie Marrow, M.D.     1. Tibial plateau fracture      I spoke with Dr. Genella Mech, who reviewed.  Ms. Tozer x-ray, he is requesting a CT scan through the knee pain medication, immobilization and office followup on December 26 MDM  We'll x-ray to evaluate for potential fracture provided pain medication        Arman Filter, NP 02/26/11 0114  Arman Filter, NP 02/26/11 0304  Arman Filter, NP 02/26/11 [phone]

## 2011-03-02 ENCOUNTER — Emergency Department (HOSPITAL_COMMUNITY)
Admission: EM | Admit: 2011-03-02 | Discharge: 2011-03-02 | Disposition: A | Discharge status: Home / Self Care | Payer: Self-pay | Attending: Emergency Medicine | Admitting: Emergency Medicine

## 2011-03-02 ENCOUNTER — Encounter (HOSPITAL_COMMUNITY): Payer: Self-pay

## 2011-03-02 DIAGNOSIS — M79609 Pain in unspecified limb: Secondary | ICD-10-CM | POA: Insufficient documentation

## 2011-03-02 DIAGNOSIS — F17200 Nicotine dependence, unspecified, uncomplicated: Secondary | ICD-10-CM | POA: Insufficient documentation

## 2011-03-02 DIAGNOSIS — Z4789 Encounter for other orthopedic aftercare: Secondary | ICD-10-CM | POA: Insufficient documentation

## 2011-03-02 DIAGNOSIS — S82143A Displaced bicondylar fracture of unspecified tibia, initial encounter for closed fracture: Secondary | ICD-10-CM

## 2011-03-02 DIAGNOSIS — R609 Edema, unspecified: Secondary | ICD-10-CM | POA: Insufficient documentation

## 2011-03-02 DIAGNOSIS — R110 Nausea: Secondary | ICD-10-CM

## 2011-03-02 MED ORDER — ONDANSETRON 4 MG PO TBDP
8.0000 mg | ORAL_TABLET | Freq: Once | ORAL | Status: AC
  Administered 2011-03-02: 8 mg via ORAL
  Filled 2011-03-02: qty 2

## 2011-03-02 MED ORDER — OXYCODONE-ACETAMINOPHEN 5-325 MG PO TABS
2.0000 | ORAL_TABLET | Freq: Once | ORAL | Status: AC
  Administered 2011-03-02: 2 via ORAL
  Filled 2011-03-02: qty 2

## 2011-03-02 MED ORDER — ONDANSETRON HCL 4 MG PO TABS: 4.0000 mg | ORAL_TABLET | Freq: Three times a day (TID) | ORAL | Status: AC | PRN

## 2011-03-02 MED ORDER — OXYCODONE-ACETAMINOPHEN 7.5-325 MG PO TABS: ORAL_TABLET | ORAL | Status: DC

## 2011-03-02 NOTE — Discharge Instructions (Signed)
Narcotic and benzodiazepine use may cause drowsiness, slowed breathing or dependence.  Please use with caution and do not drive, operate machinery or watch young children alone while taking them.  Taking combinations of these medications or drinking alcohol will potentiate these effects.    

## 2011-03-02 NOTE — ED Notes (Signed)
Patient here for continued pain in left leg, dx with fx here on Sunday. sts has persistant pain from knee radiaiting to left hip, sts has bad sweats last several mins from time to time and also has problems using the crutches. Pt has immobilizer on left knee at present. Attempted to follow Elisha Headland but sts that his office called back and told her there was nothing they could do and she did not need to follow upwith them.

## 2011-03-02 NOTE — Progress Notes (Signed)
Paged by Marland Mcalpine to provide patient assistance in obtaining a wheelchair. I contacted Darion of [facility] who provided the patient with the wheelchair and instructions.

## 2011-03-02 NOTE — ED Notes (Signed)
Ortho Tech at bedside.  

## 2011-03-02 NOTE — ED Notes (Signed)
Pt. Was here on Tuesday and diagnosed with a rt. Broken tibula.  Pt. Has not been able to follow-up with orthopedic and she is having nausea, pain has not changed and also periods of sweating.

## 2011-03-02 NOTE — ED Provider Notes (Signed)
History     CSN: [id number]  Arrival date & time 03/02/11  4098   First MD Initiated Contact with Patient 03/02/11 1011      Chief Complaint  Patient presents with  . Extremity Pain    (Consider location/radiation/quality/duration/timing/severity/associated sxs/prior treatment) HPI Comments: Pt was seen last Sunday and was found to have a left knee fracture.  She was given crutches, knee immobilizer and told to follow up with Dr. Lestine Box with [facility].  She did call and then was told to cancel her appt by ortho and to contact Dr. Carola Frost instead.  She has called Dr. Carola Frost and has appt this Tuesday, in 4 days.  She reports continued intermittent nausea since her injury, also she is taking percocet, but pain is only partially helped.  She denies constipation.  She also has questions about her knee brace that she feels it is not fitting completely correctly.  And with her crutches, she endorses difficulty getting around attributing to generlized weakness to right leg which is not a new problem, but due to this, she is banging her left knee into things.  No new discoloration, numbness, or new injury.  She has some pain in left hip that seems to radiate up from her knee.  Denies new weakness or back pain.    Patient is a 40 y.o. female presenting with extremity pain. The history is provided by the patient.  Extremity Pain Pertinent negatives include no shortness of breath.    Past Medical History  Diagnosis Date  . Bronchitis   . Chronic bipolar disorder   . GERD (gastroesophageal reflux disease)   . Sleep apnea     Past Surgical History  Procedure Date  . Cesarean section   . Cesarean section   . Tubal ligation     Family History  Problem Relation Age of Onset  . Thyroid disease Father   . Diabetes Other   . Cancer Other     History  Substance Use Topics  . Smoking status: Current Everyday Smoker -- 0.5 packs/day  . Smokeless tobacco: Not on file  . Alcohol Use: Yes     OB History    Grav Para Term Preterm Abortions TAB SAB Ect Mult Living                  Review of Systems  Constitutional: Negative.   Respiratory: Negative for shortness of breath.   Gastrointestinal: Positive for nausea. Negative for vomiting.  Musculoskeletal: Positive for joint swelling and arthralgias.  Skin: Negative for rash and wound.  Neurological: Negative for weakness and numbness.    Allergies  Aspirin; Penicillins; and Septra  Home Medications   Current Outpatient Rx  Name Route Sig Dispense Refill  . LAMOTRIGINE 100 MG PO TABS Oral Take 100 mg by mouth daily.      . OXYCODONE-ACETAMINOPHEN 5-325 MG PO TABS Oral Take 1 tablet by mouth every 4 (four) hours as needed for pain. 30 tablet 0  . TRAZODONE HCL 100 MG PO TABS Oral Take 50 mg by mouth at bedtime.      Marland Kitchen ZOLPIDEM TARTRATE 10 MG PO TABS Oral Take 10 mg by mouth at bedtime.        BP 155/76  Pulse 81  Temp(Src) 98.4 F (36.9 C) (Oral)  Resp 22  Ht 5\' 7"  (1.702 m)  Wt 215 lb (97.523 kg)  BMI 33.67 kg/m2  SpO2 100%  LMP 01/21/2011  Physical Exam  Nursing note and vitals reviewed.  Constitutional: She appears well-developed and well-nourished.  HENT:  Head: Normocephalic and atraumatic.  Pulmonary/Chest: Effort normal.  Abdominal: Soft.  Musculoskeletal: She exhibits edema and tenderness.       Legs:      Pt is in sitting position with no sig discomfort noted to left hip - likely some referred pain to hip from knee fracture  Neurological: She is alert.       5/5 plantar flexion of left ankle  Skin: Skin is warm and dry. No rash noted.    ED Course  Procedures (including critical care time)  Labs Reviewed - No data to display No results found.   No diagnosis found.    MDM  Will prescribe nausea meds along with higher dose oxycodone.  Will prescribe wheelchair for pt.  Pt has appt with Dr. Carola Frost on Tuesday according to patient.  Ortho tech to see pt and re-address size of knee  immobilizer with patient.  I reviewed knee films from last week.  Reinforced need for ice and elevation while at home.         Gavin Pound. Suzi Hernan, MD 03/02/11 1027

## 2011-03-02 NOTE — ED Notes (Signed)
Returned a call to SW about pt request with help for receiving a wheelchair.

## 2011-03-05 ENCOUNTER — Encounter (HOSPITAL_COMMUNITY): Payer: Self-pay | Admitting: Pharmacy Technician

## 2011-03-07 ENCOUNTER — Encounter (HOSPITAL_COMMUNITY): Payer: Self-pay | Admitting: *Deleted

## 2011-03-07 MED ORDER — CHLORHEXIDINE GLUCONATE 4 % EX LIQD: 60.0000 mL | Freq: Once | CUTANEOUS | Status: DC

## 2011-03-07 MED ORDER — VANCOMYCIN HCL IN DEXTROSE 1-5 GM/200ML-% IV SOLN
1000.0000 mg | INTRAVENOUS | Status: AC
  Administered 2011-03-08: 1000 mg via INTRAVENOUS
  Filled 2011-03-07: qty 200

## 2011-03-07 NOTE — H&P (Signed)
Orthopaedic Trauma Service H&P Chief Complaint: L knee pain s/p fall HPI: Ms. [name] is a 41 y/o AA female who sustained a ground level fall on 02/26/2011 in her kitchen.  Pt reportedly slipped on water. Immediate onset of pain and inability to bear weight.  Went to ER for evaluation and was found to have a L tibial plateau fracture.  Pt was instructed to f/u with orthopaedics as an outpatient but was unable to get an appointment with the on call group.  As such pt followed up with OTS for evaluation.  She was found to have a fracture requiring operative intervention.  Past Medical History  Diagnosis Date  . Bronchitis   . Chronic bipolar disorder   . GERD (gastroesophageal reflux disease)   . Sleep apnea     Past Surgical History  Procedure Date  . Cesarean section   . Cesarean section   . Tubal ligation     Family History  Problem Relation Age of Onset  . Thyroid disease Father   . Diabetes Other   . Cancer Other    Social History:  reports that she has been smoking.  She does not have any smokeless tobacco history on file. She reports that she drinks alcohol. She reports that she does not use illicit drugs.  Allergies:  Allergies  Allergen Reactions  . Aspirin     "chilhood allergy"  . Penicillins Swelling  . Septra (Bactrim) Itching and Swelling    No current facility-administered medications on file as of .   Medications Prior to Admission  Medication Sig Dispense Refill  . lamoTRIgine (LAMICTAL) 100 MG tablet Take 100 mg by mouth daily.        . ondansetron (ZOFRAN) 4 MG tablet Take 1 tablet (4 mg total) by mouth every 8 (eight) hours as needed for nausea.  15 tablet  0  . oxyCODONE-acetaminophen (PERCOCET) 5-325 MG per tablet Take 1 tablet by mouth every 4 (four) hours as needed for pain.  30 tablet  0  . traZODone (DESYREL) 100 MG tablet Take 50 mg by mouth at bedtime.        Marland Kitchen zolpidem (AMBIEN) 10 MG tablet Take 10 mg by mouth at bedtime.          No results  found for this or any previous visit (from the past 48 hour(s)). No results found.  Review of Systems  Constitutional: Negative.   HENT: Negative.   Eyes: Negative.   Respiratory: Negative for cough and shortness of breath.   Cardiovascular: Negative for chest pain and palpitations.  Gastrointestinal: Negative for nausea, vomiting and abdominal pain.  Genitourinary: Negative for dysuria.  Skin: Negative.   Neurological: Positive for tingling.       Dorsum of L foot  Endo/Heme/Allergies: Negative.     Last menstrual period 01/21/2011. Physical Exam  Constitutional: She is oriented to person, place, and time. She appears well-developed and well-nourished. No distress.  HENT:  Head: Normocephalic and atraumatic.  Mouth/Throat: Oropharynx is clear and moist.  Eyes: EOM are normal.  Neck: Normal range of motion. Neck supple.  Cardiovascular: Normal rate and regular rhythm.   No murmur heard. Respiratory: Effort normal and breath sounds normal. She has no wheezes. She has no rales.  GI: Soft. Bowel sounds are normal. She exhibits no distension. There is no tenderness.  Musculoskeletal:       Left Lower Extremity       Hip and ankle without acute findings  TTP proximal L tibia       Stability not assessed for L knee secondary to known fracture       Distal motor and sensory functions intact        Extremity is warm       + DP pulse        Compartments are soft and nontender       No pain with passive stretch       Swelling well controlled  Neurological: She is alert and oriented to person, place, and time.  Skin: Skin is warm and dry.    Xray an CT scan      Split depressed fracture of L lateral tibial plateau  Assessment/Plan  41 y/o AA female s/p fall with shatzker II L tibial plateau fracture  OR for ORIF L tibial plateau Will admit after surgery for pain control and therapy Would expect 2-3 hospitalization post op NWB x 6 weeks, begin ROM L knee post op day 2 in  hinged brace   Nancy Latin, PA-C 03/07/2011, 12:03 PM

## 2011-03-08 ENCOUNTER — Encounter (HOSPITAL_COMMUNITY): Payer: Self-pay | Admitting: Anesthesiology

## 2011-03-08 ENCOUNTER — Inpatient Hospital Stay (HOSPITAL_COMMUNITY)
Admission: RE | Admit: 2011-03-08 | Discharge: 2011-03-10 | DRG: 488 | Disposition: A | Discharge status: Home Health | Payer: Self-pay | Source: Ambulatory Visit | Attending: Orthopedic Surgery | Admitting: Orthopedic Surgery

## 2011-03-08 ENCOUNTER — Ambulatory Visit (HOSPITAL_COMMUNITY): Payer: Self-pay | Admitting: Anesthesiology

## 2011-03-08 ENCOUNTER — Ambulatory Visit (HOSPITAL_COMMUNITY): Payer: Self-pay

## 2011-03-08 ENCOUNTER — Encounter (HOSPITAL_COMMUNITY)
Admission: RE | Disposition: A | Discharge status: Home / Self Care | Payer: Self-pay | Source: Ambulatory Visit | Attending: Orthopedic Surgery

## 2011-03-08 ENCOUNTER — Encounter (HOSPITAL_COMMUNITY): Payer: Self-pay | Admitting: *Deleted

## 2011-03-08 DIAGNOSIS — Z6833 Body mass index (BMI) 33.0-33.9, adult: Secondary | ICD-10-CM

## 2011-03-08 DIAGNOSIS — F17200 Nicotine dependence, unspecified, uncomplicated: Secondary | ICD-10-CM

## 2011-03-08 DIAGNOSIS — Y998 Other external cause status: Secondary | ICD-10-CM

## 2011-03-08 DIAGNOSIS — Z01818 Encounter for other preprocedural examination: Secondary | ICD-10-CM

## 2011-03-08 DIAGNOSIS — Y92009 Unspecified place in unspecified non-institutional (private) residence as the place of occurrence of the external cause: Secondary | ICD-10-CM

## 2011-03-08 DIAGNOSIS — F319 Bipolar disorder, unspecified: Secondary | ICD-10-CM

## 2011-03-08 DIAGNOSIS — G4730 Sleep apnea, unspecified: Secondary | ICD-10-CM | POA: Diagnosis present

## 2011-03-08 DIAGNOSIS — G4733 Obstructive sleep apnea (adult) (pediatric): Secondary | ICD-10-CM

## 2011-03-08 DIAGNOSIS — S82109A Unspecified fracture of upper end of unspecified tibia, initial encounter for closed fracture: Principal | ICD-10-CM | POA: Diagnosis present

## 2011-03-08 DIAGNOSIS — Z01810 Encounter for preprocedural cardiovascular examination: Secondary | ICD-10-CM

## 2011-03-08 DIAGNOSIS — Z23 Encounter for immunization: Secondary | ICD-10-CM

## 2011-03-08 DIAGNOSIS — S82143A Displaced bicondylar fracture of unspecified tibia, initial encounter for closed fracture: Secondary | ICD-10-CM

## 2011-03-08 DIAGNOSIS — K219 Gastro-esophageal reflux disease without esophagitis: Secondary | ICD-10-CM

## 2011-03-08 DIAGNOSIS — E669 Obesity, unspecified: Secondary | ICD-10-CM

## 2011-03-08 DIAGNOSIS — W010XXA Fall on same level from slipping, tripping and stumbling without subsequent striking against object, initial encounter: Secondary | ICD-10-CM | POA: Diagnosis present

## 2011-03-08 DIAGNOSIS — E871 Hypo-osmolality and hyponatremia: Secondary | ICD-10-CM | POA: Diagnosis not present

## 2011-03-08 DIAGNOSIS — S83289A Other tear of lateral meniscus, current injury, unspecified knee, initial encounter: Secondary | ICD-10-CM | POA: Diagnosis present

## 2011-03-08 HISTORY — DX: Chronic kidney disease, unspecified: N18.9

## 2011-03-08 HISTORY — PX: ORIF TIBIA PLATEAU: SHX2132

## 2011-03-08 HISTORY — DX: Sickle-cell trait: D57.3

## 2011-03-08 HISTORY — DX: Depression, unspecified: F32.A

## 2011-03-08 HISTORY — DX: Tubulo-interstitial nephritis, not specified as acute or chronic: N12

## 2011-03-08 HISTORY — DX: Urinary tract infection, site not specified: N39.0

## 2011-03-08 HISTORY — DX: Major depressive disorder, single episode, unspecified: F32.9

## 2011-03-08 HISTORY — DX: Mental disorder, not otherwise specified: F99

## 2011-03-08 HISTORY — DX: Anxiety disorder, unspecified: F41.9

## 2011-03-08 LAB — CBC
HCT: 37.8 % (ref 36.0–46.0)
Hemoglobin: 13 g/dL (ref 12.0–15.0)
MCH: 29.5 pg (ref 26.0–34.0)
MCHC: 34.4 g/dL (ref 30.0–36.0)
MCV: 85.9 fL (ref 78.0–100.0)
Platelets: 337 10*3/uL (ref 150–400)
RBC: 4.4 MIL/uL (ref 3.87–5.11)
RDW: 13.8 % (ref 11.5–15.5)
WBC: 6.7 10*3/uL (ref 4.0–10.5)

## 2011-03-08 LAB — DIFFERENTIAL
Basophils Absolute: 0 10*3/uL (ref 0.0–0.1)
Basophils Relative: 1 % (ref 0–1)
Eosinophils Absolute: 0.1 10*3/uL (ref 0.0–0.7)
Eosinophils Relative: 2 % (ref 0–5)
Lymphocytes Relative: 34 % (ref 12–46)
Lymphs Abs: 2.3 10*3/uL (ref 0.7–4.0)
Monocytes Absolute: 0.5 10*3/uL (ref 0.1–1.0)
Monocytes Relative: 7 % (ref 3–12)
Neutro Abs: 3.8 10*3/uL (ref 1.7–7.7)
Neutrophils Relative %: 57 % (ref 43–77)

## 2011-03-08 LAB — COMPREHENSIVE METABOLIC PANEL
ALT: 7 U/L (ref 0–35)
AST: 13 U/L (ref 0–37)
Albumin: 3.4 g/dL — ABNORMAL LOW (ref 3.5–5.2)
Alkaline Phosphatase: 86 U/L (ref 39–117)
BUN: 9 mg/dL (ref 6–23)
CO2: 22 mEq/L (ref 19–32)
Calcium: 9.2 mg/dL (ref 8.4–10.5)
Chloride: 104 mEq/L (ref 96–112)
Creatinine, Ser: 0.7 mg/dL (ref 0.50–1.10)
GFR calc Af Amer: >90 mL/min (ref >90)
GFR calc non Af Amer: >90 mL/min (ref >90)
Glucose, Bld: 93 mg/dL (ref 70–99)
Potassium: 4.2 mEq/L (ref 3.5–5.1)
Sodium: 136 mEq/L (ref 135–145)
Total Bilirubin: 0.3 mg/dL (ref 0.3–1.2)
Total Protein: 7.3 g/dL (ref 6.0–8.3)

## 2011-03-08 LAB — APTT: aPTT: 32 seconds (ref 24–37)

## 2011-03-08 LAB — SURGICAL PCR SCREEN
MRSA, PCR: POSITIVE — AB
Staphylococcus aureus: POSITIVE — AB

## 2011-03-08 LAB — PROTIME-INR
INR: 1.09 (ref 0.00–1.49)
Prothrombin Time: 14.3 seconds (ref 11.6–15.2)

## 2011-03-08 LAB — TYPE AND SCREEN
ABO/RH(D): O POS
Antibody Screen: NEGATIVE

## 2011-03-08 LAB — ABO/RH: ABO/RH(D): O POS

## 2011-03-08 SURGERY — OPEN REDUCTION INTERNAL FIXATION (ORIF) TIBIAL PLATEAU
Anesthesia: General | Site: Leg Lower | Laterality: Left | Wound class: Clean

## 2011-03-08 MED ORDER — ACETAMINOPHEN 10 MG/ML IV SOLN
1000.0000 mg | Freq: Four times a day (QID) | INTRAVENOUS | Status: DC
  Administered 2011-03-08 – 2011-03-09 (×3): 1000 mg via INTRAVENOUS
  Filled 2011-03-08 (×7): qty 100

## 2011-03-08 MED ORDER — ONDANSETRON HCL 4 MG/2ML IJ SOLN
INTRAMUSCULAR | Status: DC | PRN
  Administered 2011-03-08: 4 mg via INTRAVENOUS

## 2011-03-08 MED ORDER — HYDROMORPHONE HCL PF 1 MG/ML IJ SOLN
0.5000 mg | INTRAMUSCULAR | Status: DC | PRN
  Administered 2011-03-09 – 2011-03-10 (×4): 1 mg via INTRAVENOUS
  Filled 2011-03-08 (×4): qty 1

## 2011-03-08 MED ORDER — FENTANYL CITRATE 0.05 MG/ML IJ SOLN
100.0000 ug | Freq: Once | INTRAMUSCULAR | Status: AC
  Administered 2011-03-08: 100 ug via INTRAVENOUS

## 2011-03-08 MED ORDER — ONDANSETRON HCL 4 MG/2ML IJ SOLN: 4.0000 mg | Freq: Once | INTRAMUSCULAR | Status: DC | PRN

## 2011-03-08 MED ORDER — LACTATED RINGERS IV SOLN
INTRAVENOUS | Status: DC
  Administered 2011-03-08: 09:00:00 via INTRAVENOUS

## 2011-03-08 MED ORDER — DOCUSATE SODIUM 100 MG PO CAPS
100.0000 mg | ORAL_CAPSULE | Freq: Two times a day (BID) | ORAL | Status: DC
  Administered 2011-03-08 – 2011-03-10 (×4): 100 mg via ORAL
  Filled 2011-03-08 (×6): qty 1

## 2011-03-08 MED ORDER — MAGNESIUM CITRATE PO SOLN
1.0000 | Freq: Once | ORAL | Status: AC | PRN
  Filled 2011-03-08: qty 296

## 2011-03-08 MED ORDER — MIDAZOLAM HCL 2 MG/2ML IJ SOLN: 0.5000 mg | Freq: Once | INTRAMUSCULAR | Status: DC | PRN

## 2011-03-08 MED ORDER — CEFAZOLIN SODIUM 1-5 GM-% IV SOLN
1.0000 g | Freq: Four times a day (QID) | INTRAVENOUS | Status: AC
  Administered 2011-03-08 – 2011-03-09 (×3): 1 g via INTRAVENOUS
  Filled 2011-03-08 (×4): qty 50

## 2011-03-08 MED ORDER — TRAZODONE HCL 50 MG PO TABS
50.0000 mg | ORAL_TABLET | Freq: Every day | ORAL | Status: DC
  Administered 2011-03-08 – 2011-03-09 (×2): 50 mg via ORAL
  Filled 2011-03-08 (×4): qty 1

## 2011-03-08 MED ORDER — DIPHENHYDRAMINE HCL 12.5 MG/5ML PO ELIX
12.5000 mg | ORAL_SOLUTION | Freq: Four times a day (QID) | ORAL | Status: DC | PRN
  Filled 2011-03-08: qty 5

## 2011-03-08 MED ORDER — NALOXONE HCL 0.4 MG/ML IJ SOLN: 0.4000 mg | INTRAMUSCULAR | Status: DC | PRN

## 2011-03-08 MED ORDER — SODIUM CHLORIDE 0.9 % IJ SOLN: 9.0000 mL | INTRAMUSCULAR | Status: DC | PRN

## 2011-03-08 MED ORDER — LAMOTRIGINE 100 MG PO TABS
100.0000 mg | ORAL_TABLET | Freq: Every day | ORAL | Status: DC
  Administered 2011-03-09 – 2011-03-10 (×2): 100 mg via ORAL
  Filled 2011-03-08 (×3): qty 1

## 2011-03-08 MED ORDER — PROPOFOL 10 MG/ML IV EMUL
INTRAVENOUS | Status: DC | PRN
  Administered 2011-03-08: 160 mg via INTRAVENOUS

## 2011-03-08 MED ORDER — DIPHENHYDRAMINE HCL 50 MG/ML IJ SOLN: 12.5000 mg | Freq: Four times a day (QID) | INTRAMUSCULAR | Status: DC | PRN

## 2011-03-08 MED ORDER — ONDANSETRON HCL 4 MG PO TABS: 4.0000 mg | ORAL_TABLET | Freq: Four times a day (QID) | ORAL | Status: DC | PRN

## 2011-03-08 MED ORDER — SODIUM CHLORIDE 0.9 % IV SOLN
INTRAVENOUS | Status: DC | PRN
  Administered 2011-03-08: 11:00:00 via INTRAVENOUS

## 2011-03-08 MED ORDER — GLYCOPYRROLATE 0.2 MG/ML IJ SOLN
INTRAMUSCULAR | Status: DC | PRN
  Administered 2011-03-08: .4 mg via INTRAVENOUS

## 2011-03-08 MED ORDER — METHOCARBAMOL 100 MG/ML IJ SOLN
500.0000 mg | Freq: Once | INTRAVENOUS | Status: AC
  Administered 2011-03-08: 500 mg via INTRAVENOUS
  Filled 2011-03-08: qty 5

## 2011-03-08 MED ORDER — METHOCARBAMOL 500 MG PO TABS
500.0000 mg | ORAL_TABLET | Freq: Four times a day (QID) | ORAL | Status: DC
  Administered 2011-03-08 – 2011-03-09 (×2): 500 mg via ORAL
  Administered 2011-03-09 – 2011-03-10 (×3): 1000 mg via ORAL
  Administered 2011-03-10 (×2): 500 mg via ORAL
  Filled 2011-03-08 (×3): qty 2
  Filled 2011-03-08: qty 1
  Filled 2011-03-08 (×3): qty 2
  Filled 2011-03-08: qty 1
  Filled 2011-03-08 (×2): qty 2
  Filled 2011-03-08: qty 1
  Filled 2011-03-08 (×2): qty 2
  Filled 2011-03-08: qty 1
  Filled 2011-03-08 (×2): qty 2

## 2011-03-08 MED ORDER — ZOLPIDEM TARTRATE 10 MG PO TABS
10.0000 mg | ORAL_TABLET | Freq: Every day | ORAL | Status: DC
  Administered 2011-03-08 – 2011-03-09 (×2): 10 mg via ORAL
  Filled 2011-03-08 (×2): qty 1

## 2011-03-08 MED ORDER — ONDANSETRON HCL 4 MG/2ML IJ SOLN: 4.0000 mg | Freq: Four times a day (QID) | INTRAMUSCULAR | Status: DC | PRN

## 2011-03-08 MED ORDER — MIDAZOLAM HCL 2 MG/2ML IJ SOLN
INTRAMUSCULAR | Status: AC
  Filled 2011-03-08: qty 2

## 2011-03-08 MED ORDER — NEOSTIGMINE METHYLSULFATE 1 MG/ML IJ SOLN
INTRAMUSCULAR | Status: DC | PRN
  Administered 2011-03-08: 2 mg via INTRAVENOUS

## 2011-03-08 MED ORDER — ENOXAPARIN SODIUM 40 MG/0.4ML ~~LOC~~ SOLN
40.0000 mg | SUBCUTANEOUS | Status: DC
Start: 2011-03-08 — End: 2011-03-10
  Administered 2011-03-08 – 2011-03-09 (×2): 40 mg via SUBCUTANEOUS
  Filled 2011-03-08 (×3): qty 0.4

## 2011-03-08 MED ORDER — MIDAZOLAM HCL 2 MG/2ML IJ SOLN
2.0000 mg | Freq: Once | INTRAMUSCULAR | Status: AC
  Administered 2011-03-08: 2 mg via INTRAVENOUS

## 2011-03-08 MED ORDER — MUPIROCIN 2 % EX OINT
TOPICAL_OINTMENT | CUTANEOUS | Status: AC
  Administered 2011-03-08: 08:00:00 via NASAL
  Filled 2011-03-08: qty 22

## 2011-03-08 MED ORDER — POTASSIUM CHLORIDE IN NACL 20-0.9 MEQ/L-% IV SOLN
INTRAVENOUS | Status: DC
  Administered 2011-03-08 – 2011-03-09 (×2): via INTRAVENOUS
  Filled 2011-03-08 (×4): qty 1000

## 2011-03-08 MED ORDER — OXYCODONE HCL 5 MG PO TABS
5.0000 mg | ORAL_TABLET | ORAL | Status: DC | PRN
  Administered 2011-03-09 (×2): 20 mg via ORAL
  Administered 2011-03-09: 10 mg via ORAL
  Administered 2011-03-09: 20 mg via ORAL
  Administered 2011-03-10: 15 mg via ORAL
  Administered 2011-03-10: 20 mg via ORAL
  Filled 2011-03-08: qty 2
  Filled 2011-03-08: qty 3
  Filled 2011-03-08: qty 2
  Filled 2011-03-08 (×3): qty 4
  Filled 2011-03-08: qty 2

## 2011-03-08 MED ORDER — MAGNESIUM HYDROXIDE 400 MG/5ML PO SUSP: 30.0000 mL | Freq: Every day | ORAL | Status: DC | PRN

## 2011-03-08 MED ORDER — HYDROMORPHONE 0.3 MG/ML IV SOLN
INTRAVENOUS | Status: DC
  Administered 2011-03-08: 0.6 mg via INTRAVENOUS
  Administered 2011-03-08: 15:00:00 via INTRAVENOUS
  Administered 2011-03-08: 3 mg via INTRAVENOUS
  Administered 2011-03-09: 4.8 mg via INTRAVENOUS
  Administered 2011-03-09: 02:00:00 via INTRAVENOUS
  Administered 2011-03-09: 2.4 mg via INTRAVENOUS
  Administered 2011-03-09: 2.1 mg via INTRAVENOUS
  Filled 2011-03-08 (×2): qty 25

## 2011-03-08 MED ORDER — FENTANYL CITRATE 0.05 MG/ML IJ SOLN
INTRAMUSCULAR | Status: AC
  Filled 2011-03-08: qty 2

## 2011-03-08 MED ORDER — METOCLOPRAMIDE HCL 10 MG PO TABS: 5.0000 mg | ORAL_TABLET | Freq: Three times a day (TID) | ORAL | Status: DC | PRN

## 2011-03-08 MED ORDER — ROCURONIUM BROMIDE 100 MG/10ML IV SOLN
INTRAVENOUS | Status: DC | PRN
  Administered 2011-03-08: 50 mg via INTRAVENOUS

## 2011-03-08 MED ORDER — HYDROMORPHONE HCL PF 1 MG/ML IJ SOLN
0.2500 mg | INTRAMUSCULAR | Status: DC | PRN
  Administered 2011-03-08 (×2): 0.5 mg via INTRAVENOUS

## 2011-03-08 MED ORDER — HYDROXYZINE HCL 25 MG PO TABS
25.0000 mg | ORAL_TABLET | Freq: Three times a day (TID) | ORAL | Status: DC | PRN
  Filled 2011-03-08: qty 1

## 2011-03-08 MED ORDER — LACTATED RINGERS IV SOLN
INTRAVENOUS | Status: DC | PRN
  Administered 2011-03-08 (×2): via INTRAVENOUS

## 2011-03-08 MED ORDER — 0.9 % SODIUM CHLORIDE (POUR BTL) OPTIME
TOPICAL | Status: DC | PRN
  Administered 2011-03-08: 1000 mL

## 2011-03-08 MED ORDER — METHOCARBAMOL 100 MG/ML IJ SOLN
500.0000 mg | Freq: Four times a day (QID) | INTRAVENOUS | Status: DC
  Administered 2011-03-08: 500 mg via INTRAVENOUS
  Filled 2011-03-08 (×6): qty 5

## 2011-03-08 MED ORDER — METOCLOPRAMIDE HCL 5 MG/ML IJ SOLN
5.0000 mg | Freq: Three times a day (TID) | INTRAMUSCULAR | Status: DC | PRN
  Filled 2011-03-08: qty 2

## 2011-03-08 MED ORDER — FENTANYL CITRATE 0.05 MG/ML IJ SOLN
INTRAMUSCULAR | Status: DC | PRN
  Administered 2011-03-08 (×3): 100 ug via INTRAVENOUS
  Administered 2011-03-08: 200 ug via INTRAVENOUS
  Administered 2011-03-08: 100 ug via INTRAVENOUS

## 2011-03-08 MED ORDER — BISACODYL 10 MG RE SUPP: 10.0000 mg | Freq: Every day | RECTAL | Status: DC | PRN

## 2011-03-08 SURGICAL SUPPLY — 84 items
7hole left proximal tibial plate ×2 IMPLANT
BANDAGE ELASTIC 4 VELCRO ST LF (GAUZE/BANDAGES/DRESSINGS) ×2 IMPLANT
BANDAGE ELASTIC 6 VELCRO ST LF (GAUZE/BANDAGES/DRESSINGS) ×2 IMPLANT
BANDAGE GAUZE ELAST BULKY 4 IN (GAUZE/BANDAGES/DRESSINGS) ×2 IMPLANT
BIT DRILL 100X2.5XANTM LCK (BIT) ×1 IMPLANT
BIT DRILL CAL (BIT) ×1 IMPLANT
BIT DRL 100X2.5XANTM LCK (BIT) ×1
BLADE SURG 10 STRL SS (BLADE) IMPLANT
BLADE SURG 15 STRL LF DISP TIS (BLADE) ×1 IMPLANT
BLADE SURG 15 STRL SS (BLADE) ×1
BLADE SURG ROTATE 9660 (MISCELLANEOUS) IMPLANT
BRUSH SCRUB DISP (MISCELLANEOUS) ×4 IMPLANT
CEMENT CAL PHOSPHATE 5CC (Cement) ×2 IMPLANT
CLEANER TIP ELECTROSURG 2X2 (MISCELLANEOUS) ×2 IMPLANT
CLOTH BEACON ORANGE TIMEOUT ST (SAFETY) ×2 IMPLANT
COVER MAYO STAND STRL (DRAPES) ×2 IMPLANT
DRAPE C-ARM 42X72 X-RAY (DRAPES) ×2 IMPLANT
DRAPE C-ARMOR (DRAPES) ×2 IMPLANT
DRAPE INCISE IOBAN 66X45 STRL (DRAPES) IMPLANT
DRAPE ORTHO SPLIT 77X108 STRL (DRAPES)
DRAPE SURG ORHT 6 SPLT 77X108 (DRAPES) IMPLANT
DRAPE U-SHAPE 47X51 STRL (DRAPES) ×2 IMPLANT
DRILL BIT 2.5MM (BIT) ×1
DRILL BIT CAL (BIT) ×2
DRSG ADAPTIC 3X8 NADH LF (GAUZE/BANDAGES/DRESSINGS) ×2 IMPLANT
DRSG PAD ABDOMINAL 8X10 ST (GAUZE/BANDAGES/DRESSINGS) ×8 IMPLANT
ELECT REM PT RETURN 9FT ADLT (ELECTROSURGICAL) ×2
ELECTRODE REM PT RTRN 9FT ADLT (ELECTROSURGICAL) ×1 IMPLANT
EVACUATOR 1/8 PVC DRAIN (DRAIN) IMPLANT
EVACUATOR 3/16  PVC DRAIN (DRAIN)
EVACUATOR 3/16 PVC DRAIN (DRAIN) IMPLANT
GLOVE BIO SURGEON STRL SZ7.5 (GLOVE) ×2 IMPLANT
GLOVE BIO SURGEON STRL SZ8 (GLOVE) IMPLANT
GLOVE BIOGEL PI IND STRL 7.0 (GLOVE) ×1 IMPLANT
GLOVE BIOGEL PI IND STRL 7.5 (GLOVE) ×1 IMPLANT
GLOVE BIOGEL PI IND STRL 8 (GLOVE) ×1 IMPLANT
GLOVE BIOGEL PI INDICATOR 7.0 (GLOVE) ×1
GLOVE BIOGEL PI INDICATOR 7.5 (GLOVE) ×1
GLOVE BIOGEL PI INDICATOR 8 (GLOVE) ×1
GLOVE SURG SS PI 6.5 STRL IVOR (GLOVE) ×4 IMPLANT
GOWN PREVENTION PLUS XLARGE (GOWN DISPOSABLE) ×2 IMPLANT
GOWN STRL NON-REIN LRG LVL3 (GOWN DISPOSABLE) ×4 IMPLANT
IMMOBILIZER KNEE 22 UNIV (SOFTGOODS) ×2 IMPLANT
K-WIRE ACE 1.6X6 (WIRE) ×12
KIT BASIN OR (CUSTOM PROCEDURE TRAY) ×2 IMPLANT
KIT ROOM TURNOVER OR (KITS) ×2 IMPLANT
KWIRE ACE 1.6X6 (WIRE) ×6 IMPLANT
MANIFOLD NEPTUNE II (INSTRUMENTS) ×2 IMPLANT
NEEDLE 22X1 1/2 (OR ONLY) (NEEDLE) IMPLANT
NS IRRIG 1000ML POUR BTL (IV SOLUTION) ×2 IMPLANT
PACK ORTHO EXTREMITY (CUSTOM PROCEDURE TRAY) ×2 IMPLANT
PAD ARMBOARD 7.5X6 YLW CONV (MISCELLANEOUS) ×4 IMPLANT
PAD CAST 4YDX4 CTTN HI CHSV (CAST SUPPLIES) ×1 IMPLANT
PADDING CAST COTTON 4X4 STRL (CAST SUPPLIES) ×1
PADDING CAST COTTON 6X4 STRL (CAST SUPPLIES) ×2 IMPLANT
SCREW CORTICAL 3.5MM 36MM (Screw) ×2 IMPLANT
SCREW CORTICAL 3.5MM 38MM (Screw) ×4 IMPLANT
SCREW LOCK CORT STAR 3.5X34 (Screw) ×2 IMPLANT
SCREW LOCK CORT STAR 3.5X65 (Screw) ×6 IMPLANT
SCREW LOCK CORT STAR 3.5X70 (Screw) ×2 IMPLANT
SCREW LOCK CORT STAR 3.5X75 (Screw) ×2 IMPLANT
SCREW LOW PROF CORTICAL 3.5X80 (Screw) ×2 IMPLANT
SCREW LP 3.5X70MM (Screw) ×2 IMPLANT
SPONGE GAUZE 4X4 12PLY (GAUZE/BANDAGES/DRESSINGS) ×2 IMPLANT
SPONGE LAP 18X18 X RAY DECT (DISPOSABLE) ×4 IMPLANT
STAPLER VISISTAT 35W (STAPLE) ×2 IMPLANT
STOCKINETTE IMPERVIOUS LG (DRAPES) ×2 IMPLANT
SUCTION FRAZIER TIP 10 FR DISP (SUCTIONS) ×2 IMPLANT
SUT ETHILON 3 0 PS 1 (SUTURE) IMPLANT
SUT PROLENE 0 CT 2 (SUTURE) ×2 IMPLANT
SUT VIC AB 0 CT1 27 (SUTURE) ×1
SUT VIC AB 0 CT1 27XBRD ANBCTR (SUTURE) ×1 IMPLANT
SUT VIC AB 1 CT1 27 (SUTURE) ×1
SUT VIC AB 1 CT1 27XBRD ANBCTR (SUTURE) ×1 IMPLANT
SUT VIC AB 2-0 CT1 27 (SUTURE) ×2
SUT VIC AB 2-0 CT1 TAPERPNT 27 (SUTURE) ×2 IMPLANT
SYR 20ML ECCENTRIC (SYRINGE) IMPLANT
TOWEL OR 17X24 6PK STRL BLUE (TOWEL DISPOSABLE) ×2 IMPLANT
TOWEL OR 17X26 10 PK STRL BLUE (TOWEL DISPOSABLE) ×4 IMPLANT
TRAY FOLEY BAG SILVER LF 14FR (CATHETERS) IMPLANT
TRAY FOLEY CATH 14FR (SET/KITS/TRAYS/PACK) IMPLANT
TUBE CONNECTING 12X1/4 (SUCTIONS) ×2 IMPLANT
WATER STERILE IRR 1000ML POUR (IV SOLUTION) ×4 IMPLANT
YANKAUER SUCT BULB TIP NO VENT (SUCTIONS) ×4 IMPLANT

## 2011-03-08 NOTE — Anesthesia Postprocedure Evaluation (Signed)
  Anesthesia Post-op Note  Patient: [name]  Procedure(s) Performed:  OPEN REDUCTION INTERNAL FIXATION (ORIF) TIBIAL PLATEAU  Patient Location: PACU  Anesthesia Type: General  Level of Consciousness: awake, alert  and oriented  Airway and Oxygen Therapy: Patient Spontanous Breathing and Patient connected to nasal cannula oxygen  Post-op Pain: mild  Post-op Assessment: Post-op Vital signs reviewed, Patient's Cardiovascular Status Stable, Respiratory Function Stable, Patent Airway, No signs of Nausea or vomiting and Pain level controlled  Post-op Vital Signs: Reviewed and stable  Complications: No apparent anesthesia complications

## 2011-03-08 NOTE — H&P (Signed)
I have seen and examined the patient. I agree with the findings noted in [place] [facility]'s H&P on 03-07-11. I discussed with the patient the risks and benefits of surgery, including the possibility of infection, nerve injury, vessel injury, wound breakdown, malunion, nonunion, arthritis, symptomatic hardware, DVT/ PE, loss of motion, and need for further surgery among others. She understands these risks and wishes to proceed.  Richie Vadala H 03/08/2011 10:04 AM

## 2011-03-08 NOTE — Preoperative (Signed)
Beta Blockers   Reason not to administer Beta Blockers:Not Applicable 

## 2011-03-08 NOTE — Anesthesia Preprocedure Evaluation (Addendum)
Anesthesia Evaluation  Patient identified by MRN, date of birth, ID band Patient awake    Reviewed: Allergy & Precautions, H&P , NPO status   Airway Mallampati: II TM Distance: >3 FB Neck ROM: Full    Dental  (+) Teeth Intact   Pulmonary Current Smoker,  clear to auscultation        Cardiovascular Regular Normal    Neuro/Psych    GI/Hepatic   Endo/Other    Renal/GU      Musculoskeletal   Abdominal   Peds  Hematology   Anesthesia Other Findings   Reproductive/Obstetrics                           Anesthesia Physical Anesthesia Plan  ASA: II  Anesthesia Plan: General   Post-op Pain Management:    Induction: Intravenous  Airway Management Planned: Oral ETT  Additional Equipment:   Intra-op Plan:   Post-operative Plan: Extubation in OR  Informed Consent: I have reviewed the patients History and Physical, chart, labs and discussed the procedure including the risks, benefits and alternatives for the proposed anesthesia with the patient or authorized representative who has indicated his/her understanding and acceptance.   Dental advisory given  Plan Discussed with: CRNA and Surgeon  Anesthesia Plan Comments: (L. Tibial Plateau FX Bipolar disorder Obesity Smoker GERD  Plan GA with Fem Nerve Block.  Kipp Brood, MD)        Anesthesia Quick Evaluation

## 2011-03-08 NOTE — Transfer of Care (Signed)
Immediate Anesthesia Transfer of Care Note  Patient: [name]  Procedure(s) Performed:  OPEN REDUCTION INTERNAL FIXATION (ORIF) TIBIAL PLATEAU  Patient Location: PACU  Anesthesia Type: General  Level of Consciousness: awake and patient cooperative  Airway & Oxygen Therapy: Patient Spontanous Breathing and Patient connected to nasal cannula oxygen  Post-op Assessment: Report given to PACU RN, Post -op Vital signs reviewed and stable and Patient moving all extremities  Post vital signs: Reviewed and stable  Complications: No apparent anesthesia complications

## 2011-03-08 NOTE — Brief Op Note (Signed)
03/08/2011  1:10 PM  PATIENT:  [name]  41 y.o. female  PRE-OPERATIVE DIAGNOSIS:   1. Left Tibial plateau fracture 2. Tibial eminence fracture 3. Suspected lateral mensicus avulsion  POST-OPERATIVE DIAGNOSIS:  1. Left Tibial plateau fracture 2. Tibial eminence fracture 3. Suspected lateral mensicus avulsion   PROCEDURE:  Procedure(s): 1. OPEN REDUCTION INTERNAL FIXATION (ORIF) TIBIAL PLATEAU 2. ORIF tibial eminence 3. Open treatment lateral mensicus tear with partial excision 4. Anterior compartment fasciotomy  SURGEON:  Surgeon(s): Doralee Albino Rayanna Matusik  PHYSICIAN ASSISTANT: Montez Morita, [facility]  ANESTHESIA:   general  EBL:  Total I/O In: 1200 [I.V.:1200] Out: 375 [Urine:325; Blood:50]  BLOOD ADMINISTERED:none  DRAINS: none   LOCAL MEDICATIONS USED:  NONE  SPECIMEN:  No Specimen  DISPOSITION OF SPECIMEN:  N/A  COUNTS:  YES  TOURNIQUET:   Total Tourniquet Time Documented: Thigh (Left) - 118 minutes  DICTATION: .Other Dictation: Dictation Number [id number]  PLAN OF CARE: Admit to inpatient   PATIENT DISPOSITION:  PACU - hemodynamically stable.   Delay start of Pharmacological VTE agent (>24hrs) due to surgical blood loss or risk of bleeding: No

## 2011-03-08 NOTE — Anesthesia Procedure Notes (Addendum)
Anesthesia Regional Block:  Femoral nerve block  Pre-Anesthetic Checklist: ,, timeout performed, Correct Patient, Correct Site, Correct Laterality, Correct Procedure, Correct Position, site marked, Risks and benefits discussed,  Surgical consent,  Pre-op evaluation,  At surgeon's request and post-op pain management  Laterality: Left  Prep: Maximum Sterile Barrier Precautions used and chloraprep       Needles:  Injection technique: Single-shot  Needle Type: Stimulator Needle - 40      Needle Gauge: 22 and 22 G    Additional Needles:  Procedures: nerve stimulator Femoral nerve block  Nerve Stimulator or Paresthesia:  Response: 0.4 mA,   Additional Responses:   Narrative:  Start time: 03/08/2011 9:30 AM End time: 03/08/2011 9:40 AM  Performed by: Personally   Additional Notes: 30 cc 0.5% Bupivicaine injected in 5 cc increments without difficulty.  Kipp Brood, MD   Procedure Name: Intubation Date/Time: 03/08/2011 10:43 AM Performed by: Gwenyth Allegra Pre-anesthesia Checklist: Patient identified, Timeout performed, Emergency Drugs available, Suction available and Patient being monitored Patient Re-evaluated:Patient Re-evaluated prior to inductionOxygen Delivery Method: Circle System Utilized Preoxygenation: Pre-oxygenation with 100% oxygen Intubation Type: IV induction Ventilation: Mask ventilation without difficulty and Oral airway inserted - appropriate to patient size Laryngoscope Size: Mac and 3 Grade View: Grade I Tube type: Oral Tube size: 7.0 mm Number of attempts: 1 Placement Confirmation: ETT inserted through vocal cords under direct vision,  positive ETCO2 and breath sounds checked- equal and bilateral Secured at: 21 cm Tube secured with: Tape Dental Injury: Teeth and Oropharynx as per pre-operative assessment

## 2011-03-08 NOTE — Progress Notes (Signed)
Rn received patient from PACU. Pt alert and oriented to unit 5000. Rn introduced self and talked about her pain medications, diet and activity level. Pt had no questions at this time. Rn will continue to monitor for any needs.

## 2011-03-09 ENCOUNTER — Encounter (HOSPITAL_COMMUNITY): Payer: Self-pay | Admitting: Orthopedic Surgery

## 2011-03-09 LAB — CBC
HCT: 36.2 % (ref 36.0–46.0)
Hemoglobin: 12.1 g/dL (ref 12.0–15.0)
MCH: 28.8 pg (ref 26.0–34.0)
MCHC: 33.4 g/dL (ref 30.0–36.0)
MCV: 86.2 fL (ref 78.0–100.0)
Platelets: 346 10*3/uL (ref 150–400)
RBC: 4.2 MIL/uL (ref 3.87–5.11)
RDW: 13.9 % (ref 11.5–15.5)
WBC: 8.4 10*3/uL (ref 4.0–10.5)

## 2011-03-09 LAB — BASIC METABOLIC PANEL
BUN: 7 mg/dL (ref 6–23)
CO2: 27 mEq/L (ref 19–32)
Calcium: 8.9 mg/dL (ref 8.4–10.5)
Chloride: 101 mEq/L (ref 96–112)
Creatinine, Ser: 0.73 mg/dL (ref 0.50–1.10)
GFR calc Af Amer: >90 mL/min (ref >90)
GFR calc non Af Amer: >90 mL/min (ref >90)
Glucose, Bld: 110 mg/dL — ABNORMAL HIGH (ref 70–99)
Potassium: 3.9 mEq/L (ref 3.5–5.1)
Sodium: 134 mEq/L — ABNORMAL LOW (ref 135–145)

## 2011-03-09 MED ORDER — ENOXAPARIN SODIUM 40 MG/0.4ML ~~LOC~~ SOLN: 40.0000 mg | SUBCUTANEOUS | Status: DC

## 2011-03-09 MED ORDER — CHLORHEXIDINE GLUCONATE CLOTH 2 % EX PADS
6.0000 | MEDICATED_PAD | Freq: Every day | CUTANEOUS | Status: DC
  Administered 2011-03-09 – 2011-03-10 (×2): 6 via TOPICAL

## 2011-03-09 MED ORDER — METHOCARBAMOL 500 MG PO TABS: 500.0000 mg | ORAL_TABLET | Freq: Four times a day (QID) | ORAL | Status: AC | PRN

## 2011-03-09 MED ORDER — OXYCODONE-ACETAMINOPHEN 5-325 MG PO TABS: 1.0000 | ORAL_TABLET | Freq: Four times a day (QID) | ORAL | Status: AC | PRN

## 2011-03-09 MED ORDER — MENTHOL 3 MG MT LOZG
1.0000 | LOZENGE | OROMUCOSAL | Status: DC | PRN
  Filled 2011-03-09: qty 9

## 2011-03-09 MED ORDER — NICOTINE 21 MG/24HR TD PT24
21.0000 mg | MEDICATED_PATCH | Freq: Every day | TRANSDERMAL | Status: DC
  Administered 2011-03-09 – 2011-03-10 (×2): 21 mg via TRANSDERMAL
  Filled 2011-03-09 (×2): qty 1

## 2011-03-09 MED ORDER — OXYCODONE-ACETAMINOPHEN 5-325 MG PO TABS
1.0000 | ORAL_TABLET | Freq: Four times a day (QID) | ORAL | Status: DC | PRN
  Administered 2011-03-09 – 2011-03-10 (×2): 2 via ORAL
  Filled 2011-03-09 (×2): qty 2

## 2011-03-09 MED ORDER — DSS 100 MG PO CAPS: 100.0000 mg | ORAL_CAPSULE | Freq: Two times a day (BID) | ORAL | Status: AC

## 2011-03-09 MED ORDER — MUPIROCIN 2 % EX OINT
TOPICAL_OINTMENT | Freq: Two times a day (BID) | CUTANEOUS | Status: DC
  Administered 2011-03-09 – 2011-03-10 (×3): via NASAL
  Filled 2011-03-09: qty 22

## 2011-03-09 MED ORDER — OXYCODONE HCL 5 MG PO TABS: 5.0000 mg | ORAL_TABLET | ORAL | Status: AC | PRN

## 2011-03-09 MED FILL — Vancomycin HCl For IV Soln 1 GM (Base Equivalent): INTRAVENOUS | Qty: 1000 | Status: AC

## 2011-03-09 NOTE — Progress Notes (Signed)
Case Manager arranged for Home Health, rolling walker and 3in1.. Entered in TLC. [facility] will provide care.

## 2011-03-09 NOTE — Progress Notes (Signed)
Occupational Therapy Evaluation Patient Details Name: [name] MRN: [id number] DOB: 14-Jan-1971 Today's Date: 03/09/2011  Problem List:  Patient Active Problem List  Diagnoses  . Fracture of tibial plateau, closed  . GERD (gastroesophageal reflux disease)  . Bipolar disorder  . Sleep apnea  . Obesity  . Nicotine dependence    Past Medical History:  Past Medical History  Diagnosis Date  . Bronchitis   . Chronic bipolar disorder   . GERD (gastroesophageal reflux disease)   . Sleep apnea     CPAP  . Mental disorder     bipolar  . Anxiety   . Chronic kidney disease   . UTI (lower urinary tract infection) 10/2010  . Sickle cell trait   . Depression     bipolar  . Pyelonephritis 10/2010   Past Surgical History:  Past Surgical History  Procedure Date  . Cesarean section   . Cesarean section   . Tubal ligation   . Orif tibia plateau 03/08/2011    Procedure: OPEN REDUCTION INTERNAL FIXATION (ORIF) TIBIAL PLATEAU;  Surgeon: Budd Palmer;  Location: [facility] OR;  Service: Orthopedics;  Laterality: Left;    OT Assessment/Plan/Recommendation OT Assessment Clinical Impression Statement: Pt s/p ORIF of L tibial plateau. Pt demonstrates decreased I with ADLs and functional transfers.  Will benefit from acute OT to increase ADLs and functional mobility in prep for d/c home. OT Recommendation/Assessment: Patient will need skilled OT in the acute care venue OT Problem List: Decreased knowledge of use of DME or AE;Decreased activity tolerance;Pain OT Therapy Diagnosis : Acute pain OT Plan OT Frequency: Min 2X/week OT Treatment/Interventions: Self-care/ADL training;DME and/or AE instruction;Therapeutic activities;Patient/family education OT Recommendation Follow Up Recommendations: Home health OT Equipment Recommended: 3 in 1 bedside comode (still to determine if tub seat is needed) Individuals Consulted Consulted and Agree with Results and Recommendations: Patient OT Goals Acute  Rehab OT Goals OT Goal Formulation: With patient Time For Goal Achievement: 2 weeks ADL Goals Pt Will Perform Grooming: with modified independence;Standing at sink ADL Goal: Grooming - Progress: Not met Pt Will Perform Lower Body Bathing: with modified independence;Sit to stand from chair;Sit to stand from bed ADL Goal: Lower Body Bathing - Progress: Not met Pt Will Perform Lower Body Dressing: with modified independence;Sit to stand from chair;Sit to stand from bed ADL Goal: Lower Body Dressing - Progress: Not met Pt Will Transfer to Toilet: with modified independence;with DME;3-in-1;Stand pivot transfer ADL Goal: Toilet Transfer - Progress: Not met  OT Evaluation Precautions/Restrictions  Restrictions Weight Bearing Restrictions: Yes LLE Weight Bearing: Non weight bearing Prior Functioning Home Living Lives With: Significant other;Daughter Receives Help From: Family;Friend(s);Neighbor Type of Home: House Home Layout: One level Home Access: Stairs to enter Entrance Stairs-Rails: None Entrance Stairs-Number of Steps: 4 Bathroom Shower/Tub: Counselling psychologist: Yes How Accessible: Accessible via walker Home Adaptive Equipment: None Prior Function Level of Independence: Independent with basic ADLs;Independent with gait;Independent with transfers Able to Take Stairs?: Yes Driving: Yes Vocation: Student ADL ADL Grooming: Simulated;Set up Grooming Details (indicate cue type and reason): setup to gather grooming materials Where Assessed - Grooming: Sitting, chair Upper Body Bathing: Simulated;Set up Upper Body Bathing Details (indicate cue type and reason): Setup to gather bathing materials Where Assessed - Upper Body Bathing: Sitting, chair Lower Body Bathing: Simulated;Moderate assistance Lower Body Bathing Details (indicate cue type and reason): Mod assist for reaching L lower leg and for balance when standing Where  Assessed - Lower Body Bathing: Supported;Sit  to stand from bed Upper Body Dressing: Simulated;Set up Upper Body Dressing Details (indicate cue type and reason): setup to gather dressing materials Where Assessed - Upper Body Dressing: Sitting, chair Lower Body Dressing: Simulated;Moderate assistance Lower Body Dressing Details (indicate cue type and reason): Mod assist for threading LLE and balance when standing Where Assessed - Lower Body Dressing: Sit to stand from bed Toilet Transfer: Performed;Moderate assistance Toilet Transfer Details (indicate cue type and reason): Transferred from bed to 3n1; VC for hand placement and safe technique Toilet Transfer Method: Stand pivot Toilet Transfer Equipment: Bedside commode Toileting - Clothing Manipulation: Performed;Minimal assistance Toileting - Clothing Manipulation Details (indicate cue type and reason): Min assist for steadying when standing to perform clothing manuipulation. Where Assessed - Toileting Clothing Manipulation: Standing Toileting - Hygiene: Performed;Minimal assistance Toileting - Hygiene Details (indicate cue type and reason): Min assist for steadying when standing for hygiene Where Assessed - Toileting Hygiene: Standing Vision/Perception    Cognition Cognition Arousal/Alertness: Awake/alert Overall Cognitive Status: Appears within functional limits for tasks assessed Orientation Level: Oriented X4 Sensation/Coordination Sensation Light Touch: Appears Intact Extremity Assessment RUE Assessment RUE Assessment: Within Functional Limits LUE Assessment LUE Assessment: Within Functional Limits Mobility  Transfers Transfers: Yes Sit to Stand: 4: Min assist;3: Mod assist;From bed;From chair/3-in-1;With upper extremity assist;With armrests Sit to Stand Details (indicate cue type and reason): Mod assist from bed. Min assist from 3n1. VC for hand placement and sequencing. Stand to Sit: 4: Min assist;To chair/3-in-1;With upper  extremity assist;With armrests Stand to Sit Details: VC for hand placement and to control descent. Exercises   End of Session OT - End of Session Equipment Utilized During Treatment: Gait belt (L bledsoe brace) Activity Tolerance: Patient tolerated treatment well Patient left: in chair;with call bell in reach;with family/visitor present General Behavior During Session: [facility] for tasks performed (Pt anxious and labile during transfer)   Cipriano Mile 03/09/2011, 4:52 PM  03/09/2011 Cipriano Mile OTR/L Pager [phone] Office [phone]

## 2011-03-09 NOTE — Op Note (Signed)
[name], [name] NO.:  [id number]  MEDICAL RECORD NO.:  [id number]  LOCATION:  5014                         FACILITY:  [facility]  PHYSICIAN:  [name], M.D. DATE OF BIRTH:  20-Sep-1970  DATE OF PROCEDURE:  03/08/2011 DATE OF DISCHARGE:                              OPERATIVE REPORT   PREOPERATIVE DIAGNOSES: 1. Left tibial plateau fracture. 2. Tibial eminence fracture. 3. Suspected meniscal avulsion.  POSTOPERATIVE DIAGNOSES: 1. Left tibial plateau fracture. 2. Tibial eminence fracture. 3. Lateral meniscus tear.  PROCEDURES: 1. ORIF of lateral tibial plateau. 2. Open treatment of tibial eminence fracture. 3. Arthrotomy with partial lateral meniscectomy. 4. Anterior compartment fasciotomy.  SURGEON:  Nancy Albino. Carola Frost, MD  ASSISTING:  Nancy Latin, PA-C  ANESTHESIA:  General.  COMPLICATIONS:  None.  TOTAL TOURNIQUET TIME:  118 minutes.  DISPOSITION:  To PACU.  CONDITION:  Stable.  BRIEF SUMMARY OF INDICATION FOR PROCEDURE:  [name] is a 41 year old female who sustained a severely depressed left lateral tibial plateau fracture with involvement of the eminence.  I strongly suspected meniscus avulsion with displacement of the meniscus down into the metaphysis.  I discussed with her the risks and benefits of surgery including the possibility of infection, nerve injury, vessel injury, arthritis, loss of motion, DVT, PE, symptomatic hardware, and multiple others.  The patient did wish to proceed with recommended internal fixation and repair of the meniscus if feasible.  BRIEF SUMMARY OF PROCEDURE:  [name] was administered preoperative antibiotics, taken to the operating room where general anesthesia was induced.  Her left lower extremity was prepped and draped in usual sterile fashion.  Tourniquet was placed about the thigh, but not initially elevated.  Standard anterolateral approach over Gerdy tubercle was made.  I did incorporate her  old surgical scar, which was over Gerdy tubercle.  The IT band was split proximal to the joint and then the coronary ligament incised along the edge of the lateral plateau reflecting it proximally.  I was surprised to find that the lateral meniscus was in fact well anchored to the retinaculum; however, it had a large flap tear at the midbody, which was full-thickness and extended from the all white zone to the red-white zone.  It was not repairable. It was quite difficult to access to the limited arthrotomy because of its extensive nature and consequently, I chose to develop the anterior split and book open the fracture such that the meniscus could be addressed from the best angle with the smoothest edge and maintaining as much meniscus as absolutely possible.  My assistant, [name], provided varus distraction as well as held the meniscus proximally and the plateau externally rotated out of the field such that the partial meniscectomy to be performed.  This was tapered back to a smooth edged. The large pieces of articular cartilage were then disimpacted.  The tibial eminence was addressed first where there were several small articular fragments that were not reducible.  Other portions of the eminence were depressed.  These were elevated into an appropriate position, and then some of the surrounding metaphyseal bone was tamped and pushed up  to provide subchondral support for this portion of the  eminence.  Once the medial eminence side was reduced, we then placed the largest articular block of the lateral plateau and secured it with 2 K- wires that were passed through the medial side.  This then allowed the rim of the lateral plateau to be closed over top and secured with a pointed tenaculum initially, followed by K-wires.  A trapdoor was made in the metaphysis distally and used to access and provide further subchondral support, particularly beneath the eminence and then the lateral  cortex was closed.  Plate applied and a [employer] clamp used to secure this and compress across the articular surface with the plate elevated the appropriate level while my assistant held the knee in appropriate alignment for restoration of her anatomy.  Standard screws were used initially across the articular surface, then 1 distally in the shaft followed by additional lock screws at the surface and another standard screw distally with 1 locked and 1 kickstand screw.  Calcium phosphate cement using the [employer] product of beta DSM was then injected into the metaphyseal defect areas.  Final images showed appropriate restoration of alignment with articular congruity.  Again, meniscus was preserved.  There was no significant ligamentous instability on examination.  The vertical mattress sutures placed into the edge of the meniscus were used to repair the coronary ligament after thorough irrigation of the joint.  The IT band was then closed with figure-of-eight #1 Vicryl and 2-0 Vicryl for the subcu and staples for the skin.  Prior to closure, I did also develop the space immediately superficial and deep to the anterior compartment and performed a fasciotomy down to the ankle with the scissors curved away from the superficial peroneal nerve to reduce the potential for postoperative compartment syndrome.  The tourniquet was deflated at 118 minutes with no significant bleeding encountered.  Hemostasis was obtained with electrocautery on the way and the tourniquet was inflated immediately prior to the exposure of the bone to minimize metaphyseal bleeding. Again, Nancy Morita, PA-C, assisted me throughout the procedure from begin to end.  BRIEF SUMMARY OF PROGNOSIS:  The patient will have unrestricted range of motion of the knee to be nonweightbearing for the next 6-8 weeks with graduated weightbearing, most likely beginning at 6 weeks.  She will be on pharmacologic DVT prophylaxis and  remains at increased risk for loss of motion and eventual degenerative change, given the articular injury and partial lateral meniscectomy.     [name], M.D.     MHH/MEDQ  D:  03/08/2011  T:  03/09/2011  Job:  [id number]

## 2011-03-09 NOTE — Progress Notes (Signed)
Subjective: 1 Day Post-Op Procedure(s) (LRB): OPEN REDUCTION INTERNAL FIXATION (ORIF) TIBIAL PLATEAU (Left) Doing well Pain fairly well-controlled with IV pain medication Tolerating diet with good appetite Denies any chest pain or shortness of breath No nausea, vomiting, diarrhea  Objective: Current Vitals Blood pressure 120/58, pulse 81, temperature 98.7 F (37.1 C), temperature source Oral, resp. rate 20, height 5\' 7"  (1.702 m), weight 97.7 kg (215 lb 6.2 oz), last menstrual period 03/04/2011, SpO2 98.00%. Vital signs in last 24 hours: Temp:  [97.8 F (36.6 C)-98.9 F (37.2 C)] 98.7 F (37.1 C) (01/04 0442) Pulse Rate:  [62-90] 81  (01/04 0442) Resp:  [13-20] 20  (01/04 0744) BP: (120-155)/(58-98) 120/58 mmHg (01/04 0442) SpO2:  [97 %-100 %] 98 % (01/04 0744)  Intake/Output from previous day: 01/03 0701 - 01/04 0700 In: 2850 [I.V.:2650; IV Piggyback:200] Out: 2275 [Urine:2225; Blood:50]  LABS  Basename 03/09/11 0612 03/08/11 0758  HGB 12.1 13.0    Basename 03/09/11 0612 03/08/11 0758  WBC 8.4 6.7  RBC 4.20 4.40  HCT 36.2 37.8  PLT 346 337    Basename 03/09/11 0612 03/08/11 0758  NA 134* 136  K 3.9 4.2  CL 101 104  CO2 27 22  BUN 7 9  CREATININE 0.73 0.70  GLUCOSE 110* 93  CALCIUM 8.9 9.2    Basename 03/08/11 0758  LABPT --  INR 1.09     Physical Exam  Gen: No acute distress Lungs: Clear Cardiac: Regular Abd: Nontender with positive bowel sounds Ext: Left lower extremity  Hinged knee brace in place  Dressing clean dry and intact  Deep peroneal nerve, superficial peroneal nerve, tibial nerve sensory functions are intact  EHL, FHL, anterior tibialis, posterior tibialis, peroneals, gastrocsoleus complex are intact  Palpable dorsalis pedis pulse  Extremity is warm  No deep calf tenderness  No pain with passive stretching  Compartments are soft and nontender   Imaging Dg Tibia/fibula Left  03/08/2011  *RADIOLOGY REPORT*  Clinical Data: Left  tibial plateau ORIF  LEFT TIBIA AND FIBULA - 2 VIEW  Comparison: February 26, 2011  Findings: There is side plate and screw fixation of the lateral tibial plateau fracture which is anatomically aligned on the last images.  Fluoroscopy time:  34 seconds.  IMPRESSION: ORIF left tibial plateau fracture with anatomic alignment.  Original Report Authenticated By: Brandon Melnick, M.D.   Dg Knee Left Port  03/08/2011  *RADIOLOGY REPORT*  Clinical Data: Postop left tibial plateau plating  PORTABLE LEFT KNEE - 1-2 VIEW  Comparison: Earlier today at 12:36  Findings: There is side plate and screw fixation of the comminuted lateral tibial plateau fracture which is now anatomically aligned. Postoperative soft tissue gas is noted.  There is no peri hardware lucency or new fracture.  IMPRESSION: Anatomic alignment of left tibial plateau fracture after plating.  Original Report Authenticated By: Brandon Melnick, M.D.    Assessment/Plan: 1 Day Post-Op Procedure(s) (LRB): OPEN REDUCTION INTERNAL FIXATION (ORIF) TIBIAL PLATEAU (Left)  41 year old female status post ground level fall with left tibial plateau fracture  1. Left tibial plateau fracture status post ORIF  Nonweightbearing x6 weeks  Begin gentle knee range of motion  No pillows under knees  Maintain full extension when at rest  Ice and elevation  Dressing change tomorrow  PT/OT today 2. GERD  Prophylaxis 3. bipolar disorder  Home medication 4. sleep apnea  Patient declined CPAP 5. FEN  Diet as tolerated  Normal saline lock IV fluids  DC Foley 6. DVT/PE  prophylaxis  Lovenox x7-10 days 7. Pain  DC PCA today  Oral medication 8. Disposition  PT OT evaluation today  Probable DC home Saturday or Sunday   Mearl Latin, PA-C 03/09/2011, 8:56 AM

## 2011-03-09 NOTE — Progress Notes (Signed)
Utilization review completed. Ziyad Dyar, RN, BSN. 03/09/11  

## 2011-03-09 NOTE — Progress Notes (Signed)
Physical Therapy Evaluation Patient Details Name: [name] MRN: [id number] DOB: April 28, 1970 Today's Date: 03/09/2011  Problem List:  Patient Active Problem List  Diagnoses  . Fracture of tibial plateau, closed  . GERD (gastroesophageal reflux disease)  . Bipolar disorder  . Sleep apnea  . Obesity  . Nicotine dependence    Past Medical History:  Past Medical History  Diagnosis Date  . Bronchitis   . Chronic bipolar disorder   . GERD (gastroesophageal reflux disease)   . Sleep apnea     CPAP  . Mental disorder     bipolar  . Anxiety   . Chronic kidney disease   . UTI (lower urinary tract infection) 10/2010  . Sickle cell trait   . Depression     bipolar  . Pyelonephritis 10/2010   Past Surgical History:  Past Surgical History  Procedure Date  . Cesarean section   . Cesarean section   . Tubal ligation   . Orif tibia plateau 03/08/2011    Procedure: OPEN REDUCTION INTERNAL FIXATION (ORIF) TIBIAL PLATEAU;  Surgeon: Budd Palmer;  Location: [facility] OR;  Service: Orthopedics;  Laterality: Left;    PT Assessment/Plan/Recommendation PT Assessment Clinical Impression Statement: Pt presents with a medical diagnosis of left tibial plateau fx along with the following impairments/deficits and therapy diagnosis listed below. Pt will benefit from skilled PT in the acute care setting in order to maximize functional mobility for a safe d/c home PT Recommendation/Assessment: Patient will need skilled PT in the acute care venue PT Problem List: Decreased strength;Decreased range of motion;Decreased activity tolerance;Decreased mobility;Decreased knowledge of use of DME;Pain;Decreased knowledge of precautions PT Therapy Diagnosis : Difficulty walking;Acute pain PT Plan PT Frequency: Min 5X/week PT Treatment/Interventions: DME instruction;Gait training;Stair training;Functional mobility training;Therapeutic activities;Therapeutic exercise;Patient/family education PT  Recommendation Follow Up Recommendations: Home health PT;24 hour supervision/assistance Equipment Recommended: 3 in 1 bedside comode (still to determine if tub seat is needed) PT Goals  Acute Rehab PT Goals PT Goal Formulation: With patient Time For Goal Achievement: 7 days Pt will go Sit to Supine/Side: with modified independence PT Goal: Sit to Supine/Side - Progress: Progressing toward goal Pt will go Sit to Stand: with modified independence PT Goal: Sit to Stand - Progress: Progressing toward goal Pt will go Stand to Sit: with modified independence PT Goal: Stand to Sit - Progress: Progressing toward goal Pt will Transfer Bed to Chair/Chair to Bed: with supervision PT Transfer Goal: Bed to Chair/Chair to Bed - Progress: Progressing toward goal Pt will Ambulate: 51 - 150 feet;with supervision;with least restrictive assistive device PT Goal: Ambulate - Progress: Progressing toward goal Pt will Go Up / Down Stairs: 3-5 stairs;with min assist;with rolling walker PT Goal: Up/Down Stairs - Progress: Not met Pt will Perform Home Exercise Program: Independently PT Goal: Perform Home Exercise Program - Progress: Progressing toward goal  PT Evaluation Precautions/Restrictions  Restrictions Weight Bearing Restrictions: Yes LLE Weight Bearing: Non weight bearing Prior Functioning  Home Living Lives With: Significant other;Daughter Receives Help From: Family;Friend(s);Neighbor Type of Home: House Home Layout: One level Home Access: Stairs to enter Entrance Stairs-Rails: None Entrance Stairs-Number of Steps: 4 Bathroom Shower/Tub: Counselling psychologist: Yes How Accessible: Accessible via walker Home Adaptive Equipment: None Prior Function Level of Independence: Independent with basic ADLs;Independent with gait;Independent with transfers Able to Take Stairs?: Yes Driving: Yes Vocation:  Student Cognition Cognition Arousal/Alertness: Awake/alert Overall Cognitive Status: Appears within functional limits for tasks assessed Orientation Level: Oriented X4 Sensation/Coordination Sensation  Light Touch: Appears Intact Extremity Assessment RUE Assessment RUE Assessment: Within Functional Limits LUE Assessment LUE Assessment: Within Functional Limits RLE Assessment RLE Assessment: Within Functional Limits LLE Assessment LLE Assessment: Exceptions to WFL LLE AROM (degrees) Overall AROM Left Lower Extremity: Deficits;Due to precautions;Due to pain (Hip and Ankle WFL; Knee 0-35 PROM) LLE Strength LLE Overall Strength: Deficits;Due to pain;Due to precautions (Hip and Ankle WFL; UTA knee secondary to pain) Mobility (including Balance) Bed Mobility Bed Mobility: Yes Supine to Sit: 4: Min assist Supine to Sit Details (indicate cue type and reason): Assist with LLE secondary to pain. VC for sequencing Sitting - Scoot to Edge of Bed: 5: Supervision Sitting - Scoot to [place] of Bed Details (indicate cue type and reason):  VC for weight shifting Transfers Transfers: Yes Sit to Stand: 4: Min assist;3: Mod assist;From bed;From chair/3-in-1;With upper extremity assist;With armrests Sit to Stand Details (indicate cue type and reason): Mod assist from bed. Min assist from 3n1. VC for hand placement and sequencing. Stand to Sit: 4: Min assist;To chair/3-in-1;With upper extremity assist;With armrests Stand to Sit Details: VC for hand placement and to control descent. Ambulation/Gait Ambulation/Gait: Yes Ambulation/Gait Assistance: 4: Min assist Ambulation/Gait Assistance Details (indicate cue type and reason): VC for proper sequencing and technique with NWB on LLE. Pt able to complete hops with min assist for stability. Ambulation Distance (Feet): 20 Feet (distance limited by pain) Assistive device: Rolling walker Gait Pattern: Step-to pattern;Decreased step length - right Gait  velocity: Decreased gait speed    Exercise  General Exercises - Lower Extremity Heel Slides: AAROM;Left;10 reps;Supine End of Session PT - End of Session Equipment Utilized During Treatment: Gait belt Activity Tolerance: Patient limited by pain Patient left: in chair;with call bell in reach;with family/visitor present Nurse Communication: Mobility status for transfers;Mobility status for ambulation General Behavior During Session: [facility] for tasks performed (Pt anxious and labile during transfer) Cognition: WFL for tasks performed  Milana Kidney 03/09/2011, 5:00 PM  03/09/2011 Milana Kidney DPT PAGER: [phone] OFFICE: [phone]

## 2011-03-09 NOTE — Progress Notes (Signed)
I have seen and examined the patient. I agree with the findings above.  [name] H 03/09/2011 12:42 PM   

## 2011-03-10 LAB — CBC
HCT: 34.6 % — ABNORMAL LOW (ref 36.0–46.0)
Hemoglobin: 11.7 g/dL — ABNORMAL LOW (ref 12.0–15.0)
MCH: 29.1 pg (ref 26.0–34.0)
MCHC: 33.8 g/dL (ref 30.0–36.0)
MCV: 86.1 fL (ref 78.0–100.0)
Platelets: 331 10*3/uL (ref 150–400)
RBC: 4.02 MIL/uL (ref 3.87–5.11)
RDW: 13.6 % (ref 11.5–15.5)
WBC: 9.3 10*3/uL (ref 4.0–10.5)

## 2011-03-10 LAB — BASIC METABOLIC PANEL
BUN: 6 mg/dL (ref 6–23)
CO2: 25 mEq/L (ref 19–32)
Calcium: 9 mg/dL (ref 8.4–10.5)
Chloride: 100 mEq/L (ref 96–112)
Creatinine, Ser: 0.69 mg/dL (ref 0.50–1.10)
GFR calc Af Amer: >90 mL/min (ref >90)
GFR calc non Af Amer: >90 mL/min (ref >90)
Glucose, Bld: 128 mg/dL — ABNORMAL HIGH (ref 70–99)
Potassium: 3.7 mEq/L (ref 3.5–5.1)
Sodium: 133 mEq/L — ABNORMAL LOW (ref 135–145)

## 2011-03-10 MED ORDER — NICOTINE 21 MG/24HR TD PT24: 1.0000 | MEDICATED_PATCH | TRANSDERMAL | Status: DC

## 2011-03-10 NOTE — Progress Notes (Signed)
Physical Therapy Treatment Patient Details Name: [name] MRN: [id number] DOB: 03/26/1970 Today's Date: 03/10/2011  PT Assessment/Plan  PT - Assessment/Plan Comments on Treatment Session: Pt able to complete stairs, although still unsure of self. Recommend additional therapy session before d/c, although pt is safe to go home if discharged.  PT Plan: Discharge plan remains appropriate PT Frequency: Min 5X/week Follow Up Recommendations: Home health PT;24 hour supervision/assistance Equipment Recommended: 3 in 1 bedside comode;Rolling walker with 5" wheels PT Goals  Acute Rehab PT Goals PT Goal Formulation: With patient PT Goal: Sit to Supine/Side - Progress: Progressing toward goal PT Goal: Sit to Stand - Progress: Progressing toward goal PT Goal: Stand to Sit - Progress: Progressing toward goal PT Transfer Goal: Bed to Chair/Chair to Bed - Progress: Progressing toward goal PT Goal: Ambulate - Progress: Progressing toward goal PT Goal: Up/Down Stairs - Progress: Met PT Goal: Perform Home Exercise Program - Progress: Not met  PT Treatment Precautions/Restrictions  Restrictions Weight Bearing Restrictions: Yes LLE Weight Bearing: Non weight bearing Mobility (including Balance) Bed Mobility Bed Mobility: Yes Supine to Sit: 4: Min assist Supine to Sit Details (indicate cue type and reason): Min assist with LLE secondary to pain with knee flexion. VC for sequencing Sitting - Scoot to Edge of Bed: 5: Supervision Sitting - Scoot to [place] of Bed Details (indicate cue type and reason): VC for hand placement Transfers Transfers: Yes Sit to Stand: 4: Min assist;With upper extremity assist;From bed Sit to Stand Details (indicate cue type and reason): VC for hand placement for safety. Min assist with stability Stand to Sit: 5: Supervision;With upper extremity assist;To chair/3-in-1 Stand to Sit Details: VC for hand placement. Pt able to control descent Stand Pivot Transfers: 4: Min  assist Stand Pivot Transfer Details (indicate cue type and reason): VC for sequencing and technique from bed to 3-1 Ambulation/Gait Ambulation/Gait: Yes Ambulation/Gait Assistance: 4: Min assist Ambulation/Gait Assistance Details (indicate cue type and reason): VC for hopping sequencing. Ambulation distance limited by fatigue following stairs Ambulation Distance (Feet): 10 Feet (two trials) Assistive device: Rolling walker Gait Pattern: Step-to pattern;Decreased step length - right Gait velocity: Decreased gait speed Stairs: Yes Stairs Assistance: 4: Min assist Stairs Assistance Details (indicate cue type and reason): VC for proper stair sequencing. Min assist for support on stairs. Pt required increased encouragement throughout (Pts daughter educated and participated on technique) Stair Management Technique: No rails;Backwards;With walker Number of Stairs: 3     Exercise    End of Session PT - End of Session Equipment Utilized During Treatment: Gait belt;Other (comment) ([place]soe brace) Activity Tolerance: Patient limited by pain Patient left: in chair;with call bell in reach;with family/visitor present Nurse Communication: Mobility status for transfers;Mobility status for ambulation General Behavior During Session: [facility] for tasks performed Cognition: [facility] for tasks performed  Milana Kidney 03/10/2011, 1:50 PM  03/10/2011 Milana Kidney DPT PAGER: [phone] OFFICE: [phone]

## 2011-03-10 NOTE — Progress Notes (Signed)
Pt. Taught self-injection of Lovenox. Returned demonstration done well. No ques. Lovenox ed. Sheet given.

## 2011-03-10 NOTE — Progress Notes (Signed)
Patient ID: [name], female   DOB: [birth year], 41 y.o.   MRN: [id number] PATIENT ID:      [name]  MRN:     [id number] DOB/AGE:    07/27/1970 / 41 y.o.    PROGRESS NOTE Subjective:  negative for Chest Pain  negative for Shortness of Breath  negative for Nausea/Vomiting   negative for Calf Pain  negative for Bowel Movement   Tolerating Diet: yes         Patient reports pain as 7 on 0-10 scale.    Objective: Vital signs in last 24 hours:  Patient Vitals for the past 24 hrs:  BP Temp Temp src Pulse Resp SpO2  03/10/11 0541 142/68 mmHg 99.2 F (37.3 C) Oral 103  18  100 %  03/09/11 2107 141/71 mmHg 99.4 F (37.4 C) Oral 93  18  98 %  03/09/11 1400 152/79 mmHg 98.4 F (36.9 C) Oral 95  18  100 %      Intake/Output from previous day:   01/04 0701 - 01/05 0700 In: 1320 [P.O.:1320] Out: -    Intake/Output this shift:   01/05 0701 - 01/05 1900 In: 200 [P.O.:200] Out: -    Intake/Output      01/04 0701 - 01/05 0700 01/05 0701 - 01/06 0700   P.O. 1320 200   I.V. (mL/kg)     IV Piggyback     Total Intake(mL/kg) 1320 (13.5) 200 (2)   Urine (mL/kg/hr)     Blood     Total Output     Net +1320 +200        Urine Occurrence 3 x       LABORATORY DATA:  Basename 03/10/11 0600 03/09/11 0612 03/08/11 0758  WBC 9.3 8.4 6.7  HGB 11.7* 12.1 13.0  HCT 34.6* 36.2 37.8  PLT 331 346 337    Basename 03/10/11 0600 03/09/11 0612 03/08/11 0758  NA 133* 134* 136  K 3.7 3.9 4.2  CL 100 101 104  CO2 25 27 22   BUN 6 7 9   CREATININE 0.69 0.73 0.70  GLUCOSE 128* 110* 93  CALCIUM 9.0 8.9 9.2   Lab Results  Component Value Date   INR 1.09 03/08/2011    Examination:  General appearance: alert and mild distress  Wound Exam: clean, dry, intact   Drainage:  None: wound tissue dry  Motor Exam Posterior Interseus, FHL, Anterior Tibial and Posterior Tibial Intact  Sensory Exam Superficial Peroneal, Deep Peroneal and Tibial normal  Assessment:    2 Days Post-Op   Procedure(s) (LRB): OPEN REDUCTION INTERNAL FIXATION (ORIF) TIBIAL PLATEAU (Left)  ADDITIONAL DIAGNOSIS:  Principal Problem:  *Fracture of tibial plateau, closed Active Problems:  GERD (gastroesophageal reflux disease)  Bipolar disorder  Sleep apnea  Obesity  Nicotine dependence  Hyponatremia and Sleep Apnea   Plan: Physical Therapy as ordered Non Weight Bearing (NWB)  DVT Prophylaxis:  Lovenox  DISCHARGE PLAN: Home  DISCHARGE NEEDS: HHPT, Walker and 3-in-1 comode seat         Nakhia Levitan W 03/10/2011, 10:09 AM

## 2011-03-11 NOTE — Progress Notes (Signed)
   CARE MANAGEMENT NOTE 03/11/2011  Patient:  [name], [name]   Account Number:  [id number]  Date Initiated:  03/11/2011  Documentation initiated by:  Tera Mater  Subjective/Objective Assessment:   DX:  Left knee pain. S/P fall.  Same day surgery for left tibial plateau.     Action/Plan:   discharge planning   Anticipated DC Date:  03/10/2011   Anticipated DC Plan:  HOME W HOME HEALTH SERVICES      DC Planning Services  CM consult      Choice offered to / List presented to:     DME arranged  3-N-1  WALKER - Lavone Nian      DME agency  [facility]     HH arranged  HH-2 PT  HH-6 SOCIAL WORKER      HH agency  [facility]   Status of service:  Completed, signed off Medicare Important Message given?  NO (If response is "NO", the following Medicare IM given date fields will be blank) Date Medicare IM given:   Date Additional Medicare IM given:    Discharge Disposition:  HOME W HOME HEALTH SERVICES  Per UR Regulation:    Comments:  03/11/11 1000--TC to pt. at home to inquire of [facility] PT services.  Pt. stated she did receive a 3-N-1 and rolling walker prior to discharge from [facility].  Pt. stated she was interested in [facility] PT and that she needed assistance with obtaining her medications.  TC to [place] with [facility] to make referral for CSW for assistance with community resources for pt. Tera Mater, RN, BSN Nurse Case Manager [phone]

## 2011-03-13 NOTE — Discharge Summary (Signed)
ORTHOPAEDIC TRAUMA SERVICE DISCHARGE SUMMARY  Patient ID: [name] MRN: [id number] DOB/AGE: [birth year] 41 y.o.  Admit date: 03/08/2011 Discharge date: 03/10/2011  Admission Diagnoses: Closed left tibial plateau fracture GERD Bipolar disorder Nicotine dependence Sleep apnea Obesity  Discharge Diagnoses:  Principal Problem:  *Fracture of tibial plateau, closed Active Problems:  GERD (gastroesophageal reflux disease)  Bipolar disorder  Sleep apnea  Obesity  Nicotine dependence  Procedures performed: ORIF left tibial plateau on 03/08/2011  Discharged Condition: stable  Hospital Course: [name] is a 41 year old female who sustained a ground-level fall on Christmas Eve in her kitchen resulting in a left tibial plateau fracture. She had immediate onset of pain and inability to bear weight and went to the emergency room for evaluation. She was found to have a left tibial plateau fracture and the patient was informed to followup with orthopedics as an outpatient. She attempted to do this however was unable to obtain an appointment with the on-call group and was referred to the orthopedic, specialists for evaluation. Patient followup with [place] as an outpatient and she was found to have a fracture that was in need of surgical intervention. We discussed all risks and benefits associated with surgery and the patient was agreeable to proceed with open reduction and internal fixation. She underwent procedure noted above on 03/08/2011. She tolerated the procedure very well did not have any perioperative issues. After surgery she was transported to the PACU for recovery from anesthesia and was transported back to the orthopedic floor for continued observation, pain control as well as physical therapy. Patient's hospital stay again was uncomplicated. On postoperative day #1 patient was doing very well however her pain was still requiring IV narcotics for adequate control. She is tolerating her diet  with a very good appetite. She denies any chest pain or shortness of breath denied any nausea vomiting or diarrhea and was feeling well overall. Vital signs and labs for the patient were unremarkable on postoperative day #1 as well. She started working with physical therapy on postoperative day #1 and was able to mobilize very well. She was also fitted for a hinged knee brace to begin gentle range of motion of her left knee as well. Patient was started on Lovenox for DVT and PE prophylaxis as well. On postoperative day #2 patient was mobilizing well enough, her pain was controlled on oral medications and she was feeling well enough to go home with home health assistance. Therefore on postoperative day #2 patient was discharged to home with home health assistance.  Consults: none  Significant Diagnostic Studies: None  Treatments: IV hydration, antibiotics: Ancef, analgesia: Dilaudid and IV Tylenol, Percocet, oxycodone, anticoagulation: LMW heparin, therapies: PT, OT and RN and surgery: As above  Discharge Exam: (The patient was discharged weekend before her discharge examination was performed by Dr. Cleophas Dunker, the on call orthopedist.)  Subjective:  negative for Chest Pain  negative for Shortness of Breath  negative for Nausea/Vomiting  negative for Calf Pain  negative for Bowel Movement  Tolerating Diet: yes  Patient reports pain as 7 on 0-10 scale.   Objective:  Vital signs in last 24 hours: Patient Vitals for the past 24 hrs:   BP  Temp  Temp src  Pulse  Resp  SpO2   03/10/11 0541  142/68 mmHg  99.2 F (37.3 C)  Oral  103  18  100 %   03/09/11 2107  141/71 mmHg  99.4 F (37.4 C)  Oral  93  18  98 %  03/09/11 1400  152/79 mmHg  98.4 F (36.9 C)  Oral  95  18  100 %    Intake/Output from previous day:  01/04 0701 - 01/05 0700  In: 1320 [P.O.:1320]  Out: -  Intake/Output this shift:  01/05 0701 - 01/05 1900  In: 200 [P.O.:200]  Out: -  Intake/Output  01/04 0701 - 01/05 0700  01/05 0701 - 01/06 0700  P.O. 1320 200  I.V. (mL/kg)  IV Piggyback  Total Intake(mL/kg) 1320 (13.5) 200 (2)  Urine (mL/kg/hr)  Blood  Total Output  Net +1320 +200  Urine Occurrence 3 x   LABORATORY DATA:   Basename  03/10/11 0600  03/09/11 0612  03/08/11 0758   WBC  9.3  8.4  6.7   HGB  11.7*  12.1  13.0   HCT  34.6*  36.2  37.8   PLT  331  346  337     Basename  03/10/11 0600  03/09/11 0612  03/08/11 0758   NA  133*  134*  136   K  3.7  3.9  4.2   CL  100  101  104   CO2  25  27  22    BUN  6  7  9    CREATININE  0.69  0.73  0.70   GLUCOSE  128*  110*  93   CALCIUM  9.0  8.9  9.2    Lab Results   Component  Value  Date    INR  1.09  03/08/2011    Examination:  General appearance: alert and mild distress  Wound Exam: clean, dry, intact  Drainage: None: wound tissue dry  Motor Exam Posterior Interseus, FHL, Anterior Tibial and Posterior Tibial Intact  Sensory Exam Superficial Peroneal, Deep Peroneal and Tibial normal   Assessment:  2 Days Post-Op Procedure(s) (LRB):  OPEN REDUCTION INTERNAL FIXATION (ORIF) TIBIAL PLATEAU (Left)  ADDITIONAL DIAGNOSIS:  Principal Problem:  *Fracture of tibial plateau, closed  Active Problems:  GERD (gastroesophageal reflux disease)  Bipolar disorder  Sleep apnea  Obesity  Nicotine dependence   Hyponatremia and Sleep Apnea   Plan:  Physical Therapy as ordered Non Weight Bearing (NWB)  DVT Prophylaxis: Lovenox  DISCHARGE PLAN: Home  DISCHARGE NEEDS: HHPT, Walker and 3-in-1 comode seat   WHITFIELD, PETER W  03/10/2011, 10:09 AM   Disposition: Home or Self Care  Discharge Orders    Future Orders Please Complete By Expires   Diet - low sodium heart healthy      Call MD / Call 911      Comments:   If you experience chest pain or shortness of breath, CALL 911 and be transported to the hospital emergency room.  If you develope a fever above 101 F, pus (white drainage) or increased drainage or redness at the wound, or calf pain,  call your surgeon's office.   Constipation Prevention      Comments:   Drink plenty of fluids.  Prune juice may be helpful.  You may use a stool softener, such as Colace (over the counter) 100 mg twice a day.  Use MiraLax (over the counter) for constipation as needed.   Increase activity slowly as tolerated      Weight Bearing as taught in Physical Therapy      Comments:   Use a walker or crutches as instructed.   Driving restrictions      Comments:   No driving for 6 weeks   Lifting restrictions  Comments:   No lifting for 6 weeks     Discharge Medication List as of 03/10/2011 11:23 AM    START taking these medications   Details  docusate sodium 100 MG CAPS Take 100 mg by mouth 2 (two) times daily., Starting 03/09/2011, Until Mon 03/19/11, Print    enoxaparin (LOVENOX) 40 MG/0.4ML SOLN Inject 0.4 mLs (40 mg total) into the skin daily., Starting 03/09/2011, Until Discontinued, Print    methocarbamol (ROBAXIN) 500 MG tablet Take 1-2 tablets (500-1,000 mg total) by mouth 4 (four) times daily as needed (spasm)., Starting 03/09/2011, Until Mon 03/19/11, Print         oxyCODONE (OXY IR/ROXICODONE) 5 MG immediate release tablet Take 1-3 tablets (5-15 mg total) by mouth every 3 (three) hours as needed., Starting 03/09/2011, Until Mon 03/19/11, Print      CONTINUE these medications which have CHANGED   Details  oxyCODONE-acetaminophen (PERCOCET) 5-325 MG per tablet Take 1-2 tablets by mouth every 6 (six) hours as needed., Starting 03/09/2011, Until Mon 03/19/11, Print      CONTINUE these medications which have NOT CHANGED   Details  lamoTRIgine (LAMICTAL) 100 MG tablet Take 100 mg by mouth daily.  , Until Discontinued, Historical Med    traZODone (DESYREL) 100 MG tablet Take 50 mg by mouth at bedtime.  , Until Discontinued, Historical Med    zolpidem (AMBIEN) 10 MG tablet Take 10 mg by mouth at bedtime.  , Until Discontinued, Historical Med      STOP taking these medications     traMADol  (ULTRAM) 50 MG tablet      ondansetron (ZOFRAN) 4 MG tablet        Follow-up Information    Follow up with HANDY,MICHAEL H in 10 days. (call for appointment)    Contact information:   [address] [place] [address] [phone]         Discharge instructions and plan: Ms. Hasley has sustained a severe injury to her left tibial plateau. We were able to achieve excellent fixation with plate osteosynthesis and restore alignment, stability, repair her meniscus as well as restore joint surface congruity. We are hopeful that she will go on and heal uneventfully and have full preinjury function left leg. Ms. Eatherly will be nonweightbearing for the next 6-8 weeks however will have unrestricted range of motion of her left knee and should perform physical therapy on a regular basis. Patient can perform active, active assistive and passive range of motion of her left knee as well as have unrestricted range of motion of her left hip and ankle. Patient will be in a hinged knee brace to protect from the varus and valgus stresses while performing range of motion. Patient was started on Lovenox in the hospital for DVT and PE prophylaxis and will continue this for 10 days as well as an outpatient. She can resume a regular diet she was on before hospitalization. I also included and reviewed with the patient and care instructions with respect to her operative site. She should perform daily dressing changes evaluating her soft tissue for any signs of infection should she be concerned she will contact the office immediately. Otherwise patient can wash her wound with soap and water and apply dry dressing and her hinged knee brace. She'll use an Ace wrap or TED hose to help with swelling control as well. She is to not put on any ointments lotions or solution to her wound as these may cause them to breakdown  and need to dehiscent. This can then exposure to the possibility of a deep  infection. We will see patient back in 10-14 days for reevaluation, followup x-rays and removal of her staples. Should the patient have a question she is encouraged that the office prior to followup if need be.  Signed: Mearl Latin, PA-C 03/13/2011, 9:08 AM

## 2011-06-07 ENCOUNTER — Encounter (HOSPITAL_COMMUNITY): Payer: Self-pay | Admitting: Pharmacy Technician

## 2011-06-13 ENCOUNTER — Encounter (HOSPITAL_COMMUNITY): Payer: Self-pay

## 2011-06-13 ENCOUNTER — Encounter (HOSPITAL_COMMUNITY)
Admission: RE | Admit: 2011-06-13 | Discharge: 2011-06-13 | Disposition: A | Discharge status: Home / Self Care | Payer: Self-pay | Source: Ambulatory Visit | Attending: Orthopedic Surgery | Admitting: Orthopedic Surgery

## 2011-06-13 ENCOUNTER — Ambulatory Visit (HOSPITAL_COMMUNITY)
Admission: RE | Admit: 2011-06-13 | Discharge: 2011-06-13 | Disposition: A | Discharge status: Home / Self Care | Payer: Self-pay | Source: Ambulatory Visit | Attending: Anesthesiology | Admitting: Anesthesiology

## 2011-06-13 DIAGNOSIS — M439 Deforming dorsopathy, unspecified: Secondary | ICD-10-CM | POA: Insufficient documentation

## 2011-06-13 DIAGNOSIS — Z01812 Encounter for preprocedural laboratory examination: Secondary | ICD-10-CM | POA: Insufficient documentation

## 2011-06-13 DIAGNOSIS — D573 Sickle-cell trait: Secondary | ICD-10-CM | POA: Insufficient documentation

## 2011-06-13 DIAGNOSIS — Z01818 Encounter for other preprocedural examination: Secondary | ICD-10-CM | POA: Insufficient documentation

## 2011-06-13 DIAGNOSIS — F17200 Nicotine dependence, unspecified, uncomplicated: Secondary | ICD-10-CM | POA: Insufficient documentation

## 2011-06-13 HISTORY — DX: Headache: R51

## 2011-06-13 HISTORY — DX: Reserved for inherently not codable concepts without codable children: IMO0001

## 2011-06-13 HISTORY — DX: Encounter for other specified aftercare: Z51.89

## 2011-06-13 HISTORY — DX: Acute upper respiratory infection, unspecified: J06.9

## 2011-06-13 HISTORY — DX: Shortness of breath: R06.02

## 2011-06-13 LAB — CBC
HCT: 40.1 % (ref 36.0–46.0)
Hemoglobin: 13.5 g/dL (ref 12.0–15.0)
MCH: 28.5 pg (ref 26.0–34.0)
MCHC: 33.7 g/dL (ref 30.0–36.0)
MCV: 84.6 fL (ref 78.0–100.0)
Platelets: 298 10*3/uL (ref 150–400)
RBC: 4.74 MIL/uL (ref 3.87–5.11)
RDW: 13.8 % (ref 11.5–15.5)
WBC: 7 10*3/uL (ref 4.0–10.5)

## 2011-06-13 LAB — BASIC METABOLIC PANEL
BUN: 8 mg/dL (ref 6–23)
CO2: 24 mEq/L (ref 19–32)
Calcium: 9.4 mg/dL (ref 8.4–10.5)
Chloride: 105 mEq/L (ref 96–112)
Creatinine, Ser: 0.66 mg/dL (ref 0.50–1.10)
GFR calc Af Amer: >90 mL/min (ref >90)
GFR calc non Af Amer: >90 mL/min (ref >90)
Glucose, Bld: 81 mg/dL (ref 70–99)
Potassium: 4.2 mEq/L (ref 3.5–5.1)
Sodium: 139 mEq/L (ref 135–145)

## 2011-06-13 LAB — SURGICAL PCR SCREEN
MRSA, PCR: NEGATIVE
Staphylococcus aureus: NEGATIVE

## 2011-06-13 LAB — HCG, SERUM, QUALITATIVE: Preg, Serum: NEGATIVE

## 2011-06-13 NOTE — Pre-Procedure Instructions (Signed)
20 [name]  06/13/2011   Your procedure is scheduled on:  Friday April 12  Report to [facility] at 5:30 AM.  Call this number if you have problems the morning of surgery: [phone]   Remember:   Do not eat food:After Midnight.  May have clear liquids: up to 4 Hours before arrival.  Clear liquids include soda, tea, black coffee, apple or grape juice, broth.  Take these medicines the morning of surgery with A SIP OF WATER: Lamictal, Percocet if needed   Do not wear jewelry, make-up or nail polish.  Do not wear lotions, powders, or perfumes. You may wear deodorant.  Do not shave 48 hours prior to surgery.  Do not bring valuables to the hospital.  Contacts, dentures or bridgework may not be worn into surgery.  Leave suitcase in the car. After surgery it may be brought to your room.  For patients admitted to the hospital, checkout time is 11:00 AM the day of discharge.   Patients discharged the day of surgery will not be allowed to drive home.  Name and phone number of your driver: NA  Special Instructions: CHG Shower Use Special Wash: 1/2 bottle night before surgery and 1/2 bottle morning of surgery.   Please read over the following fact sheets that you were given: Pain Booklet, Coughing and Deep Breathing and Surgical Site Infection Prevention

## 2011-06-14 MED ORDER — VANCOMYCIN HCL IN DEXTROSE 1-5 GM/200ML-% IV SOLN
1000.0000 mg | INTRAVENOUS | Status: DC
  Filled 2011-06-14: qty 200

## 2011-06-15 ENCOUNTER — Ambulatory Visit (HOSPITAL_COMMUNITY): Payer: Self-pay | Admitting: Anesthesiology

## 2011-06-15 ENCOUNTER — Encounter (HOSPITAL_COMMUNITY): Payer: Self-pay | Admitting: *Deleted

## 2011-06-15 ENCOUNTER — Ambulatory Visit (HOSPITAL_COMMUNITY)
Admission: RE | Admit: 2011-06-15 | Discharge: 2011-06-15 | Disposition: A | Discharge status: Home / Self Care | Payer: Self-pay | Source: Ambulatory Visit | Attending: Orthopedic Surgery | Admitting: Orthopedic Surgery

## 2011-06-15 ENCOUNTER — Encounter (HOSPITAL_COMMUNITY): Payer: Self-pay | Admitting: Anesthesiology

## 2011-06-15 ENCOUNTER — Encounter (HOSPITAL_COMMUNITY)
Admission: RE | Disposition: A | Discharge status: Home / Self Care | Payer: Self-pay | Source: Ambulatory Visit | Attending: Orthopedic Surgery

## 2011-06-15 DIAGNOSIS — M24569 Contracture, unspecified knee: Secondary | ICD-10-CM | POA: Insufficient documentation

## 2011-06-15 DIAGNOSIS — R51 Headache: Secondary | ICD-10-CM | POA: Insufficient documentation

## 2011-06-15 DIAGNOSIS — R0602 Shortness of breath: Secondary | ICD-10-CM | POA: Insufficient documentation

## 2011-06-15 DIAGNOSIS — S8290XS Unspecified fracture of unspecified lower leg, sequela: Secondary | ICD-10-CM | POA: Insufficient documentation

## 2011-06-15 DIAGNOSIS — K219 Gastro-esophageal reflux disease without esophagitis: Secondary | ICD-10-CM | POA: Insufficient documentation

## 2011-06-15 DIAGNOSIS — X58XXXS Exposure to other specified factors, sequela: Secondary | ICD-10-CM | POA: Insufficient documentation

## 2011-06-15 DIAGNOSIS — F17200 Nicotine dependence, unspecified, uncomplicated: Secondary | ICD-10-CM | POA: Insufficient documentation

## 2011-06-15 HISTORY — PX: KNEE ARTHROSCOPY: SHX127

## 2011-06-15 SURGERY — ARTHROSCOPY, KNEE
Anesthesia: General | Site: Knee | Laterality: Left | Wound class: Clean

## 2011-06-15 MED ORDER — OXYCODONE-ACETAMINOPHEN 5-325 MG PO TABS: 1.0000 | ORAL_TABLET | Freq: Four times a day (QID) | ORAL | Status: DC | PRN

## 2011-06-15 MED ORDER — ONDANSETRON HCL 4 MG/2ML IJ SOLN
INTRAMUSCULAR | Status: DC | PRN
  Administered 2011-06-15: 4 mg via INTRAVENOUS

## 2011-06-15 MED ORDER — METHOCARBAMOL 500 MG PO TABS
500.0000 mg | ORAL_TABLET | Freq: Four times a day (QID) | ORAL | Status: DC | PRN
  Administered 2011-06-15: 500 mg via ORAL
  Filled 2011-06-15: qty 1

## 2011-06-15 MED ORDER — BUPIVACAINE HCL (PF) 0.25 % IJ SOLN
INTRAMUSCULAR | Status: DC | PRN
  Administered 2011-06-15: 15 mL via INTRA_ARTICULAR

## 2011-06-15 MED ORDER — FENTANYL CITRATE 0.05 MG/ML IJ SOLN
INTRAMUSCULAR | Status: DC | PRN
  Administered 2011-06-15 (×4): 50 ug via INTRAVENOUS
  Administered 2011-06-15: 100 ug via INTRAVENOUS
  Administered 2011-06-15 (×2): 50 ug via INTRAVENOUS

## 2011-06-15 MED ORDER — OXYCODONE HCL 5 MG PO TABS: 5.0000 mg | ORAL_TABLET | ORAL | Status: AC | PRN

## 2011-06-15 MED ORDER — MORPHINE SULFATE 4 MG/ML IJ SOLN
INTRAMUSCULAR | Status: DC | PRN
  Administered 2011-06-15: 4 mg via INTRAMUSCULAR

## 2011-06-15 MED ORDER — OXYCODONE HCL 5 MG PO TABS
5.0000 mg | ORAL_TABLET | ORAL | Status: AC | PRN
Start: 2011-06-15 — End: 2011-06-25

## 2011-06-15 MED ORDER — LACTATED RINGERS IV SOLN
INTRAVENOUS | Status: DC | PRN
  Administered 2011-06-15 (×2): via INTRAVENOUS

## 2011-06-15 MED ORDER — MIDAZOLAM HCL 5 MG/5ML IJ SOLN
INTRAMUSCULAR | Status: DC | PRN
  Administered 2011-06-15: 2 mg via INTRAVENOUS

## 2011-06-15 MED ORDER — LIDOCAINE HCL (CARDIAC) 20 MG/ML IV SOLN
INTRAVENOUS | Status: DC | PRN
  Administered 2011-06-15: 80 mg via INTRAVENOUS

## 2011-06-15 MED ORDER — HYDROMORPHONE HCL PF 1 MG/ML IJ SOLN: 0.2500 mg | INTRAMUSCULAR | Status: DC | PRN

## 2011-06-15 MED ORDER — LACTATED RINGERS IV SOLN: INTRAVENOUS | Status: DC

## 2011-06-15 MED ORDER — OXYCODONE HCL 5 MG PO TABS
5.0000 mg | ORAL_TABLET | ORAL | Status: DC | PRN
  Administered 2011-06-15: 5 mg via ORAL

## 2011-06-15 MED ORDER — PROPOFOL 10 MG/ML IV EMUL
INTRAVENOUS | Status: DC | PRN
  Administered 2011-06-15: 200 mg via INTRAVENOUS

## 2011-06-15 MED ORDER — METHOCARBAMOL 100 MG/ML IJ SOLN
500.0000 mg | Freq: Four times a day (QID) | INTRAVENOUS | Status: DC | PRN
  Filled 2011-06-15: qty 5

## 2011-06-15 MED ORDER — SODIUM CHLORIDE 0.9 % IR SOLN
Status: DC | PRN
  Administered 2011-06-15: 1000 mL

## 2011-06-15 MED ORDER — CHLORHEXIDINE GLUCONATE 4 % EX LIQD: 60.0000 mL | Freq: Once | CUTANEOUS | Status: DC

## 2011-06-15 MED ORDER — VANCOMYCIN HCL 1000 MG IV SOLR
1000.0000 mg | INTRAVENOUS | Status: DC | PRN
  Administered 2011-06-15: 1000 mg via INTRAVENOUS

## 2011-06-15 SURGICAL SUPPLY — 41 items
BANDAGE ELASTIC 6 VELCRO ST LF (GAUZE/BANDAGES/DRESSINGS) ×2 IMPLANT
BANDAGE ESMARK 6X9 LF (GAUZE/BANDAGES/DRESSINGS) ×1 IMPLANT
BANDAGE GAUZE ELAST BULKY 4 IN (GAUZE/BANDAGES/DRESSINGS) ×2 IMPLANT
BLADE CUDA 5.5 (BLADE) IMPLANT
BLADE GREAT WHITE 4.2 (BLADE) ×2 IMPLANT
BLADE SURG 11 STRL SS (BLADE) ×2 IMPLANT
BNDG ESMARK 6X9 LF (GAUZE/BANDAGES/DRESSINGS) ×2
BRUSH SCRUB DISP (MISCELLANEOUS) ×4 IMPLANT
CLOTH BEACON ORANGE TIMEOUT ST (SAFETY) ×2 IMPLANT
COVER SURGICAL LIGHT HANDLE (MISCELLANEOUS) ×4 IMPLANT
CUFF TOURNIQUET SINGLE 34IN LL (TOURNIQUET CUFF) ×2 IMPLANT
CUFF TOURNIQUET SINGLE 44IN (TOURNIQUET CUFF) IMPLANT
DRAPE ARTHROSCOPY W/POUCH 114 (DRAPES) ×2 IMPLANT
DRAPE C-ARMOR (DRAPES) IMPLANT
DRAPE U-SHAPE 47X51 STRL (DRAPES) ×2 IMPLANT
DRSG EMULSION OIL 3X3 NADH (GAUZE/BANDAGES/DRESSINGS) ×2 IMPLANT
DRSG PAD ABDOMINAL 8X10 ST (GAUZE/BANDAGES/DRESSINGS) ×2 IMPLANT
GAUZE SPONGE 4X4 16PLY XRAY LF (GAUZE/BANDAGES/DRESSINGS) IMPLANT
GLOVE BIO SURGEON STRL SZ7.5 (GLOVE) IMPLANT
GLOVE BIO SURGEON STRL SZ8 (GLOVE) IMPLANT
GLOVE BIOGEL PI IND STRL 7.5 (GLOVE) ×1 IMPLANT
GLOVE BIOGEL PI IND STRL 8 (GLOVE) ×1 IMPLANT
GLOVE BIOGEL PI INDICATOR 7.5 (GLOVE) ×1
GLOVE BIOGEL PI INDICATOR 8 (GLOVE) ×1
GOWN PREVENTION PLUS XLARGE (GOWN DISPOSABLE) ×2 IMPLANT
GOWN STRL NON-REIN LRG LVL3 (GOWN DISPOSABLE) ×4 IMPLANT
KIT BASIN OR (CUSTOM PROCEDURE TRAY) ×2 IMPLANT
KIT ROOM TURNOVER OR (KITS) ×2 IMPLANT
MANIFOLD NEPTUNE II (INSTRUMENTS) ×2 IMPLANT
PACK ARTHROSCOPY DSU (CUSTOM PROCEDURE TRAY) ×2 IMPLANT
PAD ARMBOARD 7.5X6 YLW CONV (MISCELLANEOUS) ×4 IMPLANT
PADDING CAST COTTON 6X4 STRL (CAST SUPPLIES) ×4 IMPLANT
SET ARTHROSCOPY TUBING (MISCELLANEOUS) ×1
SET ARTHROSCOPY TUBING LN (MISCELLANEOUS) ×1 IMPLANT
SPONGE GAUZE 4X4 12PLY (GAUZE/BANDAGES/DRESSINGS) ×2 IMPLANT
SPONGE LAP 18X18 X RAY DECT (DISPOSABLE) ×2 IMPLANT
SUT ETHILON 4 0 PS 2 18 (SUTURE) ×2 IMPLANT
TOWEL OR 17X24 6PK STRL BLUE (TOWEL DISPOSABLE) ×4 IMPLANT
TUBE CONNECTING 12X1/4 (SUCTIONS) ×2 IMPLANT
WAND 90 DEG TURBOVAC W/CORD (SURGICAL WAND) ×2 IMPLANT
WATER STERILE IRR 1000ML POUR (IV SOLUTION) ×2 IMPLANT

## 2011-06-15 NOTE — Op Note (Signed)
NAMEJESSENIA, [name]              ACCOUNT NO.:  [id number]  MEDICAL RECORD NO.:  [id number]  LOCATION:  [facility]                         FACILITY:  [facility]  PHYSICIAN:  Doralee Albino. Carola Frost, M.D. DATE OF BIRTH:  August 23, 1970  DATE OF PROCEDURE:  06/15/2011 DATE OF DISCHARGE:  06/15/2011                              OPERATIVE REPORT   PREOPERATIVE DIAGNOSIS:  Left knee posttraumatic contracture.  POSTOPERATIVE DIAGNOSIS:  Left knee posttraumatic contracture.  PROCEDURE: 1. Extensive arthroscopic synovectomy and lysis of adhesions. 2. Chondroplasty patella and trochlea 3. Manipulation under anesthesia.  SURGEON:  Doralee Albino. Carola Frost, MD  ASSISTANT:  None.  ANESTHESIA:  General.  COMPLICATIONS:  None.  ESTIMATED BLOOD LOSS:  Minimal.  TOTAL TOURNIQUET TIME:  52 minutes.  DISPOSITION:  To PACU.  CONDITION:  Stable.  BRIEF SUMMARY OF INDICATION FOR PROCEDURE:  [name] is a very pleasant 41 year old female, status post ORIF of severely depressed lateral plateau fracture that went on to unite, but has had persistent inability to obtain acceptable range of motion.  I did discuss with her the risks and benefits of arthroscopic debridement as well as other conservative measures.  The patient understood those risks to include infection, nerve injury, vessel injury, DVT, PE, multiple others including recurrence of contraction again did wish to proceed.  BRIEF SUMMARY OF PROCEDURE:  [name] was administered preoperative antibiotics and taken to the operating room where general anesthesia was induced.  Her left lower extremity was prepped and draped in usual sterile fashion.  Tourniquet was placed around the thigh and the leg exsanguinated with an Esmarch bandage, and tourniquet inflated to 300 mmHg.  Standard inferolateral and medial working portal were then established.  Diagnostic arthroscopy performed of the knee.  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  The arthroscopic wand was used to ablate much of the pathologic synovium back to a healthy tissue.  The arthroscopic shaver assisted in this. Evaluation of the medial compartment revealed a pristine medial compartment and cartilage.  The evaluation of the lateral compartment similarly revealed pristine articular cartilage and in fact the entire femur including the trochlea appeared to be in outstanding condition. There were some grade 3 changes of the patella, particularly distally. The articular surface of the tibial plateau appeared well reduced without any significant step off.  There were no cartilage changes, however, with some areas appearing very well preserved, transitioning into a few spots of fibrocartilage.  There were no uneven chondral or unstable chondral flaps there.  I did debride the synovitis along the medial and lateral gutters, particular that extending into the anterior aspect of the joint where could be pinched and produced pain and symptoms.  Following the debridement with the wand and shaver, the knee was manipulated after deflation of the tourniquet, going from about 85 degrees to 130 degrees plus.  This was a gentle manipulation that occurred quite easily following the lysis.  A sterile gently compressive dressing was then applied after aspiration and injection of the joint using a 10 mL of Marcaine mixed  with 4 of morphine.  The patient tolerated the procedure with that well and was taken to PACU in stable condition.  PROGNOSIS:  [name] will be in a CPM device with plans for discharge in the morning or later today if pain is well controlled, with ensuing aggressive physical therapy on Monday.  She is at increase risk for recurrence as well as arthritis, given her severely depressed joint surface but at this  time, her alignment looks excellent. Her lateral meniscus has healed and appears to be in good condition and these factors should mitigate against early rapid progression.  She will receive DVT prophylaxis mechanically while in the hospital and discharge on an aspirin daily.     Doralee Albino. Carola Frost, M.D.     [facility]MEDQ  D:  06/15/2011  T:  06/15/2011  Job:  [id number]

## 2011-06-15 NOTE — OR Nursing (Signed)
Plan now is for pt to go home after discussion between pt/family member and dr handy

## 2011-06-15 NOTE — Anesthesia Procedure Notes (Signed)
Procedure Name: LMA Insertion Date/Time: 06/15/2011 8:19 AM Performed by: Glendora Score A Pre-anesthesia Checklist: Patient identified, Emergency Drugs available, Suction available and Patient being monitored Patient Re-evaluated:Patient Re-evaluated prior to inductionOxygen Delivery Method: Circle system utilized Preoxygenation: Pre-oxygenation with 100% oxygen Intubation Type: IV induction LMA: LMA with gastric port inserted LMA Size: 4.0 Number of attempts: 1 Tube secured with: Tape Dental Injury: Teeth and Oropharynx as per pre-operative assessment

## 2011-06-15 NOTE — Anesthesia Postprocedure Evaluation (Signed)
  Anesthesia Post-op Note  Patient: [name]  Procedure(s) Performed: Procedure(s) (LRB): ARTHROSCOPY KNEE (Left)  Patient Location: PACU  Anesthesia Type: General  Level of Consciousness: awake  Airway and Oxygen Therapy: Patient Spontanous Breathing and Patient connected to nasal cannula oxygen  Post-op Pain: mild  Post-op Assessment: Post-op Vital signs reviewed, Patient's Cardiovascular Status Stable, Respiratory Function Stable, Patent Airway and No signs of Nausea or vomiting  Post-op Vital Signs: Reviewed and stable  Complications: No apparent anesthesia complications

## 2011-06-15 NOTE — Preoperative (Signed)
Beta Blockers   Reason not to administer Beta Blockers:Not Applicable 

## 2011-06-15 NOTE — H&P (Addendum)
[name] is an 41 y.o. female.   Chief Complaint: L knee stiffness  HPI: s/p ORIF lat plateau  Past Medical History  Diagnosis Date  . Bronchitis   . Chronic bipolar disorder   . GERD (gastroesophageal reflux disease)   . Sleep apnea     CPAP  . Mental disorder     bipolar  . Anxiety   . Chronic kidney disease   . UTI (lower urinary tract infection) 10/2010  . Sickle cell trait   . Depression     bipolar  . Pyelonephritis 10/2010  . Shortness of breath     sometimes with exertion  . Recurrent upper respiratory infection (URI)     states she has not been to MD and feels like she has  bronchitis now- greenish sputum  . Blood transfusion     1989 at [place]  . Headache     occasional    Past Surgical History  Procedure Date  . Cesarean section   . Cesarean section   . Tubal ligation   . Orif tibia plateau 03/08/2011    Procedure: OPEN REDUCTION INTERNAL FIXATION (ORIF) TIBIAL PLATEAU;  Surgeon: Budd Palmer;  Location: [facility] OR;  Service: Orthopedics;  Laterality: Left;    Family History  Problem Relation Age of Onset  . Thyroid disease Father   . Diabetes Other   . Cancer Other   . Anesthesia problems Neg Hx    Social History:  reports that she has been smoking Cigarettes.  She has a 12 pack-year smoking history. She has never used smokeless tobacco. She reports that she drinks alcohol. She reports that she uses illicit drugs ("Crack" cocaine).  Allergies:  Allergies  Allergen Reactions  . Aspirin     "chilhood allergy"  . Banana Hives  . Latex Hives  . Penicillins Swelling  . Septra (Bactrim) Itching and Swelling  . Shellfish Allergy Other (See Comments)    No "reaction" tested positive  . Strawberry Hives    Medications Prior to Admission  Medication Dose Route Frequency Provider Last Rate Last Dose  . chlorhexidine (HIBICLENS) 4 % liquid 4 application  60 mL Topical Once Mearl Latin, PA      . lactated ringers infusion   Intravenous  Continuous Mearl Latin, PA      . vancomycin (VANCOCIN) IVPB 1000 mg/200 mL premix  1,000 mg Intravenous 60 min Pre-Op Mearl Latin, PA       Medications Prior to Admission  Medication Sig Dispense Refill  . lamoTRIgine (LAMICTAL) 100 MG tablet Take 50 mg by mouth daily.       . traZODone (DESYREL) 100 MG tablet Take 50 mg by mouth at bedtime.         Results for orders placed during the hospital encounter of 06/13/11 (from the past 48 hour(s))  CBC     Status: Normal   Collection Time   06/13/11  1:51 PM      Component Value Range Comment   WBC 7.0  4.0 - 10.5 (K/uL)    RBC 4.74  3.87 - 5.11 (MIL/uL)    Hemoglobin 13.5  12.0 - 15.0 (g/dL)    HCT 16.1  09.6 - 04.5 (%)    MCV 84.6  78.0 - 100.0 (fL)    MCH 28.5  26.0 - 34.0 (pg)    MCHC 33.7  30.0 - 36.0 (g/dL)    RDW 40.9  81.1 - 91.4 (%)    Platelets  298  150 - 400 (K/uL)   HCG, SERUM, QUALITATIVE     Status: Normal   Collection Time   06/13/11  1:51 PM      Component Value Range Comment   Preg, Serum NEGATIVE  NEGATIVE    BASIC METABOLIC PANEL     Status: Normal   Collection Time   06/13/11  1:51 PM      Component Value Range Comment   Sodium 139  135 - 145 (mEq/L)    Potassium 4.2  3.5 - 5.1 (mEq/L)    Chloride 105  96 - 112 (mEq/L)    CO2 24  19 - 32 (mEq/L)    Glucose, Bld 81  70 - 99 (mg/dL)    BUN 8  6 - 23 (mg/dL)    Creatinine, Ser 4.09  0.50 - 1.10 (mg/dL)    Calcium 9.4  8.4 - 10.5 (mg/dL)    GFR calc non Af Amer >90  >90 (mL/min)    GFR calc Af Amer >90  >90 (mL/min)   SURGICAL PCR SCREEN     Status: Normal   Collection Time   06/13/11  1:54 PM      Component Value Range Comment   MRSA, PCR NEGATIVE  NEGATIVE     Staphylococcus aureus NEGATIVE  NEGATIVE     Dg Chest 2 View  06/13/2011  *RADIOLOGY REPORT*  Clinical Data: Preop for left tibia/fibular repair.  Occasional chest pain when coughing.  Smoker.  Sickle cell trait.  CHEST - 2 VIEW  Comparison: 03/30/2010  Findings: Lateral view degraded by patient arm  position.  Minimal S-shaped thoracolumbar spine curvature.  Midline trachea.  Normal heart size and mediastinal contours. No pleural effusion or pneumothorax.  Clear lungs.  Artifact projects over the right suprahilar region on both frontal.  IMPRESSION: Normal chest.  Original Report Authenticated By: Consuello Bossier, M.D.    No recent fever  Blood pressure 112/69, pulse 69, temperature 98.3 F (36.8 C), temperature source Oral, resp. rate 18, SpO2 100.00%. RRR, CTA, S/NT/ND, knee 0-70  Assessment/Plan L knee post traumatic contracture for scope LOA and manipulation  06/15/2011, 8:04 AM  I discussed with the patient the risks and benefits of surgery, including the possibility of infection, nerve injury, vessel injury, wound breakdown, arthritis, symptomatic hardware, DVT/ PE, recurrent loss of motion, and need for further surgery among others.  She understood these risks and wished to proceed.  Budd Palmer, MD 06/15/2011 8:07 AM

## 2011-06-15 NOTE — Transfer of Care (Signed)
Immediate Anesthesia Transfer of Care Note  Patient: [name]  Procedure(s) Performed: Procedure(s) (LRB): ARTHROSCOPY KNEE (Left)  Patient Location: PACU  Anesthesia Type: General  Level of Consciousness: awake, alert  and patient cooperative  Airway & Oxygen Therapy: Patient Spontanous Breathing and Patient connected to face mask oxygen  Post-op Assessment: Report given to PACU RN  Post vital signs: Reviewed and stable  Complications: No apparent anesthesia complications

## 2011-06-15 NOTE — Anesthesia Preprocedure Evaluation (Addendum)
Anesthesia Evaluation  Patient identified by MRN, date of birth, ID band Patient awake    Reviewed: Allergy & Precautions, H&P , NPO status , Patient's Chart, lab work & pertinent test results  Airway Mallampati: I TM Distance: >3 FB Neck ROM: Full    Dental No notable dental hx. (+) Teeth Intact   Pulmonary shortness of breath and at rest, sleep apnea and Continuous Positive Airway Pressure Ventilation , Recent URI , Current Smoker,  breath sounds clear to auscultation  Pulmonary exam normal       Cardiovascular negative cardio ROS  Rhythm:Regular Rate:Normal     Neuro/Psych  Headaches, PSYCHIATRIC DISORDERS    GI/Hepatic Neg liver ROS, GERD-  Poorly Controlled,  Endo/Other  negative endocrine ROS  Renal/GU negative Renal ROS  negative genitourinary   Musculoskeletal   Abdominal (+) + obese,   Peds  Hematology negative hematology ROS (+)   Anesthesia Other Findings   Reproductive/Obstetrics negative OB ROS                           Anesthesia Physical Anesthesia Plan  ASA: III  Anesthesia Plan: General   Post-op Pain Management:    Induction: Intravenous  Airway Management Planned: Oral ETT  Additional Equipment:   Intra-op Plan:   Post-operative Plan: Extubation in OR  Informed Consent: I have reviewed the patients History and Physical, chart, labs and discussed the procedure including the risks, benefits and alternatives for the proposed anesthesia with the patient or authorized representative who has indicated his/her understanding and acceptance.     Plan Discussed with: CRNA  Anesthesia Plan Comments:         Anesthesia Quick Evaluation

## 2011-06-15 NOTE — Brief Op Note (Signed)
06/15/2011  10:09 AM  PATIENT:  [name]  41 y.o. female  PRE-OPERATIVE DIAGNOSIS:  LEFT KNEE CONTRACTURE  POST-OPERATIVE DIAGNOSIS:  LEFT KNEE CONTRACTURE  PROCEDURE:  Procedure(s) (LRB): ARTHROSCOPY KNEE (Left)  SURGEON:  Surgeon(s) and Role:    * Budd Palmer, MD - Primary  ANESTHESIA:   general  EBL:  Total I/O In: 1500 [I.V.:1500] Out: -   BLOOD ADMINISTERED:none  DRAINS: none   LOCAL MEDICATIONS USED:  OTHER Marcaine and morphine  SPECIMEN:  No Specimen  DISPOSITION OF SPECIMEN:  N/A  COUNTS:  YES  TOURNIQUET:   Total Tourniquet Time Documented: Thigh (Left) - 53 minutes  DICTATION: .Other Dictation: Dictation Number [id number]  PLAN OF CARE: Admit to inpatient   PATIENT DISPOSITION:  PACU - hemodynamically stable.   Delay start of Pharmacological VTE agent (>24hrs) due to surgical blood loss or risk of bleeding: no

## 2011-06-15 NOTE — Discharge Instructions (Signed)
Weight bear as tolerated May leave dressing until Sunday, change, shower and replace ACE wrap ICE and elevate above heart

## 2011-06-18 ENCOUNTER — Encounter (HOSPITAL_COMMUNITY): Payer: Self-pay | Admitting: Orthopedic Surgery

## 2011-06-18 MED FILL — Morphine Sulfate Inj 4 MG/ML: INTRAMUSCULAR | Qty: 1 | Status: AC

## 2011-06-28 ENCOUNTER — Ambulatory Visit: Payer: Self-pay | Attending: Orthopedic Surgery

## 2011-06-28 DIAGNOSIS — IMO0001 Reserved for inherently not codable concepts without codable children: Secondary | ICD-10-CM | POA: Insufficient documentation

## 2011-06-28 DIAGNOSIS — R262 Difficulty in walking, not elsewhere classified: Secondary | ICD-10-CM | POA: Insufficient documentation

## 2011-06-28 DIAGNOSIS — R5381 Other malaise: Secondary | ICD-10-CM | POA: Insufficient documentation

## 2011-06-28 DIAGNOSIS — M6281 Muscle weakness (generalized): Secondary | ICD-10-CM | POA: Insufficient documentation

## 2011-06-28 DIAGNOSIS — M25669 Stiffness of unspecified knee, not elsewhere classified: Secondary | ICD-10-CM | POA: Insufficient documentation

## 2011-07-05 ENCOUNTER — Ambulatory Visit: Payer: Self-pay | Attending: Orthopedic Surgery | Admitting: Physical Therapy

## 2011-07-05 DIAGNOSIS — M25669 Stiffness of unspecified knee, not elsewhere classified: Secondary | ICD-10-CM | POA: Insufficient documentation

## 2011-07-05 DIAGNOSIS — IMO0001 Reserved for inherently not codable concepts without codable children: Secondary | ICD-10-CM | POA: Insufficient documentation

## 2011-07-05 DIAGNOSIS — R262 Difficulty in walking, not elsewhere classified: Secondary | ICD-10-CM | POA: Insufficient documentation

## 2011-07-05 DIAGNOSIS — M6281 Muscle weakness (generalized): Secondary | ICD-10-CM | POA: Insufficient documentation

## 2011-07-05 DIAGNOSIS — R5381 Other malaise: Secondary | ICD-10-CM | POA: Insufficient documentation

## 2011-07-31 ENCOUNTER — Ambulatory Visit: Payer: Self-pay | Admitting: Rehabilitative and Restorative Service Providers"

## 2011-08-06 ENCOUNTER — Ambulatory Visit: Payer: Self-pay | Attending: Orthopedic Surgery | Admitting: Rehabilitative and Restorative Service Providers"

## 2011-08-06 DIAGNOSIS — M25669 Stiffness of unspecified knee, not elsewhere classified: Secondary | ICD-10-CM | POA: Insufficient documentation

## 2011-08-06 DIAGNOSIS — IMO0001 Reserved for inherently not codable concepts without codable children: Secondary | ICD-10-CM | POA: Insufficient documentation

## 2011-08-06 DIAGNOSIS — R262 Difficulty in walking, not elsewhere classified: Secondary | ICD-10-CM | POA: Insufficient documentation

## 2011-08-06 DIAGNOSIS — R5381 Other malaise: Secondary | ICD-10-CM | POA: Insufficient documentation

## 2011-08-06 DIAGNOSIS — M6281 Muscle weakness (generalized): Secondary | ICD-10-CM | POA: Insufficient documentation

## 2011-08-08 ENCOUNTER — Ambulatory Visit: Payer: Self-pay | Admitting: Rehabilitative and Restorative Service Providers"

## 2011-08-13 ENCOUNTER — Encounter: Payer: Self-pay | Admitting: Rehabilitative and Restorative Service Providers"

## 2011-08-15 ENCOUNTER — Ambulatory Visit: Payer: Self-pay | Admitting: Rehabilitative and Restorative Service Providers"

## 2011-08-18 ENCOUNTER — Emergency Department (HOSPITAL_COMMUNITY)
Admission: EM | Admit: 2011-08-18 | Discharge: 2011-08-18 | Disposition: A | Discharge status: Home / Self Care | Payer: Self-pay | Attending: Emergency Medicine | Admitting: Emergency Medicine

## 2011-08-18 DIAGNOSIS — Z91040 Latex allergy status: Secondary | ICD-10-CM | POA: Insufficient documentation

## 2011-08-18 DIAGNOSIS — R110 Nausea: Secondary | ICD-10-CM | POA: Insufficient documentation

## 2011-08-18 DIAGNOSIS — Z880 Allergy status to penicillin: Secondary | ICD-10-CM | POA: Insufficient documentation

## 2011-08-18 DIAGNOSIS — Z91013 Allergy to seafood: Secondary | ICD-10-CM | POA: Insufficient documentation

## 2011-08-18 DIAGNOSIS — F17200 Nicotine dependence, unspecified, uncomplicated: Secondary | ICD-10-CM | POA: Insufficient documentation

## 2011-08-18 DIAGNOSIS — R51 Headache: Secondary | ICD-10-CM | POA: Insufficient documentation

## 2011-08-18 DIAGNOSIS — K219 Gastro-esophageal reflux disease without esophagitis: Secondary | ICD-10-CM | POA: Insufficient documentation

## 2011-08-18 DIAGNOSIS — F319 Bipolar disorder, unspecified: Secondary | ICD-10-CM | POA: Insufficient documentation

## 2011-08-18 MED ORDER — METOCLOPRAMIDE HCL 5 MG/ML IJ SOLN
10.0000 mg | Freq: Once | INTRAMUSCULAR | Status: AC
  Administered 2011-08-18: 10 mg via INTRAVENOUS
  Filled 2011-08-18: qty 2

## 2011-08-18 MED ORDER — KETOROLAC TROMETHAMINE 30 MG/ML IJ SOLN
30.0000 mg | Freq: Once | INTRAMUSCULAR | Status: AC
  Administered 2011-08-18: 30 mg via INTRAVENOUS
  Filled 2011-08-18: qty 1

## 2011-08-18 MED ORDER — DIPHENHYDRAMINE HCL 50 MG/ML IJ SOLN
25.0000 mg | Freq: Once | INTRAMUSCULAR | Status: AC
  Administered 2011-08-18: 25 mg via INTRAVENOUS
  Filled 2011-08-18: qty 1

## 2011-08-18 MED ORDER — SODIUM CHLORIDE 0.9 % IV BOLUS (SEPSIS)
1000.0000 mL | Freq: Once | INTRAVENOUS | Status: AC
  Administered 2011-08-18: 1000 mL via INTRAVENOUS

## 2011-08-18 NOTE — ED Provider Notes (Signed)
History     CSN: [id number]  Arrival date & time 08/18/11  4098   First MD Initiated Contact with Patient 08/18/11 0945      Chief Complaint  Patient presents with  . Headache    (Consider location/radiation/quality/duration/timing/severity/associated sxs/prior treatment) HPI Pt presents with c/o headache.  She states the headache is frontal and throbbing in nature.  No vomiting but has been nauseated.  Light is hurting her eyes.  No neck pain or fever.  Headache has been ongoing x 4 days.  She has tried tylenol and BC powders which have helped somewhat but headache recurs.  She has had similar headaches in the past, but this one is lasting longer.  Was gradual in onset.  There have been no other alleviating or modifying factors, there are no other associated systemic symptoms  Past Medical History  Diagnosis Date  . Bronchitis   . Chronic bipolar disorder   . GERD (gastroesophageal reflux disease)   . Sleep apnea     CPAP  . Mental disorder     bipolar  . Anxiety   . Chronic kidney disease   . UTI (lower urinary tract infection) 10/2010  . Sickle cell trait   . Depression     bipolar  . Pyelonephritis 10/2010  . Shortness of breath     sometimes with exertion  . Recurrent upper respiratory infection (URI)     states she has not been to MD and feels like she has  bronchitis now- greenish sputum  . Blood transfusion     1989 at [place]  . Headache     occasional    Past Surgical History  Procedure Date  . Cesarean section   . Cesarean section   . Tubal ligation   . Orif tibia plateau 03/08/2011    Procedure: OPEN REDUCTION INTERNAL FIXATION (ORIF) TIBIAL PLATEAU;  Surgeon: Budd Palmer;  Location: [facility] OR;  Service: Orthopedics;  Laterality: Left;  . Knee arthroscopy 06/15/2011    Procedure: ARTHROSCOPY KNEE;  Surgeon: Budd Palmer, MD;  Location: [facility] OR;  Service: Orthopedics;  Laterality: Left;  LEFT KNEE SCOPE WITH LYSIS OF ADHESIONS AND MANIPULATION     Family History  Problem Relation Age of Onset  . Thyroid disease Father   . Diabetes Other   . Cancer Other   . Anesthesia problems Neg Hx     History  Substance Use Topics  . Smoking status: Current Everyday Smoker -- 0.5 packs/day for 24 years    Types: Cigarettes  . Smokeless tobacco: Never Used  . Alcohol Use: Yes     ocassion social    OB History    Grav Para Term Preterm Abortions TAB SAB Ect Mult Living                  Review of Systems ROS reviewed and all otherwise negative except for mentioned in HPI  Allergies  Aspirin; Banana; Latex; Penicillins; Septra; Shellfish allergy; and Strawberry  Home Medications   Current Outpatient Rx  Name Route Sig Dispense Refill  . LAMOTRIGINE 100 MG PO TABS Oral Take 50 mg by mouth daily.     Marland Kitchen METHOCARBAMOL 500 MG PO TABS Oral Take 500 mg by mouth 2 (two) times daily as needed. For muscle spasms/pain.    . OXYCODONE-ACETAMINOPHEN 5-325 MG PO TABS Oral Take 1 tablet by mouth every 4 (four) hours as needed. For pain.    . TRAZODONE HCL 100 MG PO TABS Oral Take  50 mg by mouth at bedtime.       BP 117/70  Pulse 65  Temp 98.2 F (36.8 C) (Oral)  Resp 19  SpO2 100%  LMP 08/16/2011 Vitals reviewed Physical Exam Physical Examination: General appearance - alert, well appearing, and in no distress Mental status - alert, oriented to person, place, and time Eyes - pupils equal and reactive, extraocular eye movements intact Mouth - mucous membranes moist, pharynx normal without lesions Neck - supple, no significant adenopathy Heart - normal rate, regular rhythm, normal S1, S2, no murmurs, rubs, clicks or gallops Abdomen - soft, nontender, nondistended, no masses or organomegaly Neurological - alert, oriented, normal speech, no focal findings, strength 5/5 in extremities x 4, sensation intact, cranial nerves tested and intact Extremities - peripheral pulses normal, no pedal edema, no clubbing or cyanosis Skin - normal  coloration and turgor, no rashes  ED Course  Procedures (including critical care time)  11:44 AM headache resolved, pt is smiling and eating crackers.    Labs Reviewed - No data to display No results found.   1. Headache       MDM  Pt presenting with throbbing headache that was gradual in onset and similar to her prior headaches. No signs or symptoms concerning for [facility] or other emergent condition at this time.  Headache completely relieved after meds and she is tolerating po in the ED.  Pt discharged with strict return precautions, she is agreeable with this plan.         Ethelda Chick, MD 08/19/11 [phone]

## 2011-08-18 NOTE — ED Notes (Signed)
Snack of crackers and sprite given.

## 2011-08-18 NOTE — ED Notes (Signed)
Room darkened for pt comfort 

## 2011-08-18 NOTE — ED Notes (Signed)
Pt c/o headache x 4 days.  Worse today. Describes painas constant and sharp in frontal lobe with pressure in ears

## 2011-08-18 NOTE — Discharge Instructions (Signed)
Return to the ED with any concerns including vomiting, weakness of arms or legs, changes in vision, decreased level of alertness/lethargy, or any other alarming symptoms

## 2011-08-20 ENCOUNTER — Encounter: Payer: Self-pay | Admitting: Rehabilitative and Restorative Service Providers"

## 2011-08-22 ENCOUNTER — Encounter: Payer: Self-pay | Admitting: Physical Therapy

## 2011-08-27 ENCOUNTER — Encounter: Payer: Self-pay | Admitting: Physical Therapy

## 2011-08-29 ENCOUNTER — Ambulatory Visit: Payer: Self-pay | Admitting: Physical Therapy

## 2011-08-30 ENCOUNTER — Encounter: Payer: Self-pay | Admitting: Physical Therapy

## 2011-09-10 ENCOUNTER — Ambulatory Visit: Payer: Self-pay | Attending: Orthopedic Surgery | Admitting: Rehabilitative and Restorative Service Providers"

## 2011-09-10 DIAGNOSIS — R5381 Other malaise: Secondary | ICD-10-CM | POA: Insufficient documentation

## 2011-09-10 DIAGNOSIS — IMO0001 Reserved for inherently not codable concepts without codable children: Secondary | ICD-10-CM | POA: Insufficient documentation

## 2011-09-10 DIAGNOSIS — R262 Difficulty in walking, not elsewhere classified: Secondary | ICD-10-CM | POA: Insufficient documentation

## 2011-09-10 DIAGNOSIS — M25669 Stiffness of unspecified knee, not elsewhere classified: Secondary | ICD-10-CM | POA: Insufficient documentation

## 2011-09-10 DIAGNOSIS — M6281 Muscle weakness (generalized): Secondary | ICD-10-CM | POA: Insufficient documentation

## 2011-09-12 ENCOUNTER — Ambulatory Visit: Payer: Self-pay | Admitting: Rehabilitative and Restorative Service Providers"

## 2011-09-17 ENCOUNTER — Ambulatory Visit: Payer: Self-pay | Admitting: Rehabilitative and Restorative Service Providers"

## 2011-09-19 ENCOUNTER — Ambulatory Visit: Payer: Self-pay | Admitting: Rehabilitative and Restorative Service Providers"

## 2011-09-20 ENCOUNTER — Ambulatory Visit: Payer: Self-pay | Admitting: Rehabilitative and Restorative Service Providers"

## 2011-09-24 ENCOUNTER — Encounter: Payer: Self-pay | Admitting: Rehabilitative and Restorative Service Providers"

## 2011-09-24 ENCOUNTER — Ambulatory Visit: Payer: Self-pay | Admitting: Rehabilitative and Restorative Service Providers"

## 2011-09-26 ENCOUNTER — Encounter: Payer: Self-pay | Admitting: Rehabilitative and Restorative Service Providers"

## 2011-09-27 ENCOUNTER — Ambulatory Visit: Payer: Self-pay | Admitting: Physical Therapy

## 2011-10-08 ENCOUNTER — Encounter: Payer: Self-pay | Admitting: Rehabilitative and Restorative Service Providers"

## 2011-10-11 ENCOUNTER — Ambulatory Visit: Payer: Self-pay | Attending: Orthopedic Surgery | Admitting: Rehabilitative and Restorative Service Providers"

## 2011-10-11 DIAGNOSIS — M25669 Stiffness of unspecified knee, not elsewhere classified: Secondary | ICD-10-CM | POA: Insufficient documentation

## 2011-10-11 DIAGNOSIS — M6281 Muscle weakness (generalized): Secondary | ICD-10-CM | POA: Insufficient documentation

## 2011-10-11 DIAGNOSIS — R5381 Other malaise: Secondary | ICD-10-CM | POA: Insufficient documentation

## 2011-10-11 DIAGNOSIS — IMO0001 Reserved for inherently not codable concepts without codable children: Secondary | ICD-10-CM | POA: Insufficient documentation

## 2011-10-11 DIAGNOSIS — R262 Difficulty in walking, not elsewhere classified: Secondary | ICD-10-CM | POA: Insufficient documentation

## 2011-10-15 ENCOUNTER — Encounter: Payer: Self-pay | Admitting: Rehabilitative and Restorative Service Providers"

## 2011-10-18 ENCOUNTER — Encounter: Payer: Self-pay | Admitting: Rehabilitative and Restorative Service Providers"

## 2011-10-27 ENCOUNTER — Emergency Department (HOSPITAL_COMMUNITY)
Admission: EM | Admit: 2011-10-27 | Discharge: 2011-10-27 | Disposition: A | Discharge status: Home / Self Care | Payer: Self-pay | Attending: Emergency Medicine | Admitting: Emergency Medicine

## 2011-10-27 DIAGNOSIS — D573 Sickle-cell trait: Secondary | ICD-10-CM | POA: Insufficient documentation

## 2011-10-27 DIAGNOSIS — F319 Bipolar disorder, unspecified: Secondary | ICD-10-CM | POA: Insufficient documentation

## 2011-10-27 DIAGNOSIS — A5901 Trichomonal vulvovaginitis: Secondary | ICD-10-CM | POA: Insufficient documentation

## 2011-10-27 DIAGNOSIS — F17200 Nicotine dependence, unspecified, uncomplicated: Secondary | ICD-10-CM | POA: Insufficient documentation

## 2011-10-27 DIAGNOSIS — Z91013 Allergy to seafood: Secondary | ICD-10-CM | POA: Insufficient documentation

## 2011-10-27 DIAGNOSIS — K219 Gastro-esophageal reflux disease without esophagitis: Secondary | ICD-10-CM | POA: Insufficient documentation

## 2011-10-27 DIAGNOSIS — Z91040 Latex allergy status: Secondary | ICD-10-CM | POA: Insufficient documentation

## 2011-10-27 DIAGNOSIS — F411 Generalized anxiety disorder: Secondary | ICD-10-CM | POA: Insufficient documentation

## 2011-10-27 DIAGNOSIS — G4730 Sleep apnea, unspecified: Secondary | ICD-10-CM | POA: Insufficient documentation

## 2011-10-27 DIAGNOSIS — Z880 Allergy status to penicillin: Secondary | ICD-10-CM | POA: Insufficient documentation

## 2011-10-27 LAB — WET PREP, GENITAL: Yeast Wet Prep HPF POC: NONE SEEN

## 2011-10-27 LAB — POCT PREGNANCY, URINE: Preg Test, Ur: NEGATIVE

## 2011-10-27 MED ORDER — METRONIDAZOLE 500 MG PO TABS
2000.0000 mg | ORAL_TABLET | Freq: Once | ORAL | Status: AC
  Administered 2011-10-27: 2000 mg via ORAL
  Filled 2011-10-27: qty 4

## 2011-10-27 NOTE — ED Provider Notes (Signed)
History     CSN: [id number]  Arrival date & time 10/27/11  1424   First MD Initiated Contact with Patient 10/27/11 1614      Chief Complaint  Patient presents with  . Vaginitis    (Consider location/radiation/quality/duration/timing/severity/associated sxs/prior treatment) HPI NATHALEE SMARR is a 41 y.o. female presenting with lower abdominal pain and pelvic pain which started 2-3 days ago described as achy and crampy, moderate, intermittent, with sharp pangs, non-radiating, associated with yellowish vaginal DC and pruritic vulva.  She denies dysuria, fever, chills, nausea, vomiting or diarrhea and has been able to eat and drink normally.  Past Medical History  Diagnosis Date  . Bronchitis   . Chronic bipolar disorder   . GERD (gastroesophageal reflux disease)   . Sleep apnea     CPAP  . Mental disorder     bipolar  . Anxiety   . Chronic kidney disease   . UTI (lower urinary tract infection) 10/2010  . Sickle cell trait   . Depression     bipolar  . Pyelonephritis 10/2010  . Shortness of breath     sometimes with exertion  . Recurrent upper respiratory infection (URI)     states she has not been to MD and feels like she has  bronchitis now- greenish sputum  . Blood transfusion     1989 at   . Headache     occasional    Past Surgical History  Procedure Date  . Cesarean section   . Cesarean section   . Tubal ligation   . Orif tibia plateau 03/08/2011    Procedure: OPEN REDUCTION INTERNAL FIXATION (ORIF) TIBIAL PLATEAU;  Surgeon: Budd Palmer;  Location: [facility] OR;  Service: Orthopedics;  Laterality: Left;  . Knee arthroscopy 06/15/2011    Procedure: ARTHROSCOPY KNEE;  Surgeon: Budd Palmer, MD;  Location: [facility] OR;  Service: Orthopedics;  Laterality: Left;  LEFT KNEE SCOPE WITH LYSIS OF ADHESIONS AND MANIPULATION    Family History  Problem Relation Age of Onset  . Thyroid disease Father   . Diabetes Other   . Cancer Other   . Anesthesia problems Neg  Hx     History  Substance Use Topics  . Smoking status: Current Everyday Smoker -- 0.5 packs/day for 24 years    Types: Cigarettes  . Smokeless tobacco: Never Used  . Alcohol Use: Yes     ocassion social    OB History    Grav Para Term Preterm Abortions TAB SAB Ect Mult Living                  Review of Systems  Positive for vaginal DC/itch, lower abdominal pain.  VITAL SIGNS:   Filed Vitals:   10/27/11 1433  BP: 117/75  Pulse: 64  Temp: 99 F (37.2 C)  Resp: 20   CONSTITUTIONAL: Awake, oriented, appears non-toxic HENT: Atraumatic, normocephalic, oral mucosa pink and moist, airway patent. Nares patent without drainage. External ears normal. EYES: Conjunctiva clear, EOMI, PERRLA NECK: Trachea midline, non-tender, supple CARDIOVASCULAR: Normal heart rate, Normal rhythm, No murmurs, rubs, gallops PULMONARY/CHEST: Clear to auscultation, no rhonchi, wheezes, or rales. Symmetrical breath sounds. Non-tender. ABDOMINAL: Non-distended, soft, non-tender - no rebound or guarding.  BS normal. NEUROLOGIC: Non-focal, moving all four extremities, no gross sensory or motor deficits. EXTREMITIES: No clubbing, cyanosis, or edema SKIN: Warm, Dry, No erythema, No rashPatient denies any fevers or chills, changes in vision, earache, sore throat, neck pain or stiffness, chest pain or pressure,  palpitations, syncope, dyspnea, cough, wheezing,  nausea, vomiting, diarrhea, melena, red bloody stools, frequency, dysuria, myalgias, arthralgias, back pain, recent trauma, rash, skin lesions, easy bruising or bleeding, headache, seizures, numbness, tingling or weakness and denies depression, and anxiety.   Pelvic exam: normal external genitalia, mild vulvar erythema, vagina has some yellowish DC - otherwise normal, cervix mildly erythematous, no CMT, uterus and adnexa WNL.   Allergies  Aspirin; Banana; Latex; Penicillins; Septra; Shellfish allergy; and Strawberry  Home Medications   Current  Outpatient Rx  Name Route Sig Dispense Refill  . HYDROCODONE-ACETAMINOPHEN 5-325 MG PO TABS Oral Take 1 tablet by mouth every 6 (six) hours as needed. For pain    . LAMOTRIGINE 100 MG PO TABS Oral Take 50 mg by mouth daily.     Marland Kitchen METHOCARBAMOL 500 MG PO TABS Oral Take 500 mg by mouth 2 (two) times daily as needed. For muscle spasms/pain.    . TRAZODONE HCL 100 MG PO TABS Oral Take 50 mg by mouth at bedtime.       BP 117/75  Pulse 64  Temp 99 F (37.2 C) (Oral)  Resp 20  SpO2 99%  Physical Exam  ED Course  Procedures (including critical care time)  Labs Reviewed  WET PREP, GENITAL - Abnormal; Notable for the following:    Trich, Wet Prep MANY (*)     Clue Cells Wet Prep HPF POC FEW (*)     WBC, Wet Prep HPF POC MANY (*)     All other components within normal limits  GC/CHLAMYDIA PROBE AMP, GENITAL   No results found.   No diagnosis found.    MDM  ZULEIKA GALLUS is a 41 y.o. female with a h/o pyelo, presents with lower abdominal pain and vaginal DC that is very itchy c/w  Trich.  LAbs support this diagnosis, no UTI/Pyelo present by UA, patient is non-toxic and afebrile, doubt TOA, pain is mild mod and ongoing for days likewise doubt ovarian torsion or appendicitis (no N/V - inconsistent with H&P) patient is not pregnant.  Will treat Trich with precautions for not drinking EtOH (though she denies regular use.) Precautions given to return to ED if worsening symptoms - do not suspect any other STI at this time  -have collected GC/Chlamydia DNA probe and advised she seek further [facility]ph[facility]  I explained the diagnosis in detail and have given standard ER return precautions. The patient understands and accepts the medical plan as it's been dictated and I have answered all questions. Discharge instructions concerning home care and prescriptions have been given.  The patient is STABLE and is discharged to home in good condition.         Jones Skene,  MD 10/31/11 1226

## 2011-10-27 NOTE — ED Notes (Signed)
Peri-umbilical pain. Mod. Vaginal d/c- yellowish. Sexually active.

## 2011-10-30 LAB — GC/CHLAMYDIA PROBE AMP, GENITAL
Chlamydia, DNA Probe: NEGATIVE
GC Probe Amp, Genital: NEGATIVE

## 2012-03-01 ENCOUNTER — Encounter (HOSPITAL_COMMUNITY): Payer: Self-pay

## 2012-03-01 ENCOUNTER — Emergency Department (HOSPITAL_COMMUNITY): Payer: Self-pay

## 2012-03-01 ENCOUNTER — Emergency Department (HOSPITAL_COMMUNITY)
Admission: EM | Admit: 2012-03-01 | Discharge: 2012-03-01 | Disposition: A | Discharge status: Home / Self Care | Payer: Self-pay | Attending: Emergency Medicine | Admitting: Emergency Medicine

## 2012-03-01 DIAGNOSIS — R063 Periodic breathing: Secondary | ICD-10-CM | POA: Insufficient documentation

## 2012-03-01 DIAGNOSIS — N189 Chronic kidney disease, unspecified: Secondary | ICD-10-CM | POA: Insufficient documentation

## 2012-03-01 DIAGNOSIS — Z8719 Personal history of other diseases of the digestive system: Secondary | ICD-10-CM | POA: Insufficient documentation

## 2012-03-01 DIAGNOSIS — Z8659 Personal history of other mental and behavioral disorders: Secondary | ICD-10-CM | POA: Insufficient documentation

## 2012-03-01 DIAGNOSIS — J3489 Other specified disorders of nose and nasal sinuses: Secondary | ICD-10-CM | POA: Insufficient documentation

## 2012-03-01 DIAGNOSIS — F319 Bipolar disorder, unspecified: Secondary | ICD-10-CM | POA: Insufficient documentation

## 2012-03-01 DIAGNOSIS — Z8669 Personal history of other diseases of the nervous system and sense organs: Secondary | ICD-10-CM | POA: Insufficient documentation

## 2012-03-01 DIAGNOSIS — R6889 Other general symptoms and signs: Secondary | ICD-10-CM

## 2012-03-01 DIAGNOSIS — R52 Pain, unspecified: Secondary | ICD-10-CM | POA: Insufficient documentation

## 2012-03-01 DIAGNOSIS — Z87440 Personal history of urinary (tract) infections: Secondary | ICD-10-CM | POA: Insufficient documentation

## 2012-03-01 DIAGNOSIS — Z79899 Other long term (current) drug therapy: Secondary | ICD-10-CM | POA: Insufficient documentation

## 2012-03-01 DIAGNOSIS — R5381 Other malaise: Secondary | ICD-10-CM | POA: Insufficient documentation

## 2012-03-01 DIAGNOSIS — Z8709 Personal history of other diseases of the respiratory system: Secondary | ICD-10-CM | POA: Insufficient documentation

## 2012-03-01 DIAGNOSIS — R059 Cough, unspecified: Secondary | ICD-10-CM | POA: Insufficient documentation

## 2012-03-01 DIAGNOSIS — D573 Sickle-cell trait: Secondary | ICD-10-CM | POA: Insufficient documentation

## 2012-03-01 DIAGNOSIS — R05 Cough: Secondary | ICD-10-CM | POA: Insufficient documentation

## 2012-03-01 DIAGNOSIS — F17200 Nicotine dependence, unspecified, uncomplicated: Secondary | ICD-10-CM | POA: Insufficient documentation

## 2012-03-01 MED ORDER — ALBUTEROL SULFATE HFA 108 (90 BASE) MCG/ACT IN AERS: 2.0000 | INHALATION_SPRAY | RESPIRATORY_TRACT | Status: DC | PRN

## 2012-03-01 NOTE — ED Notes (Signed)
Pt returned from xray

## 2012-03-01 NOTE — ED Notes (Signed)
Patient transported to X-ray 

## 2012-03-01 NOTE — ED Notes (Signed)
Pt states she's had a cough, stuffy nose, and a feeling of "clogged ears" for a week.  Pt also states there is a sore in her left nare.  Pt states she has not taken her temperature, but states she has episodes of sweating and chills.

## 2012-03-01 NOTE — ED Provider Notes (Signed)
History     CSN: [id number]  Arrival date & time 03/01/12  0719   First MD Initiated Contact with Patient 03/01/12 [phone]      Chief Complaint  Patient presents with  . Cough  . Nasal Congestion    (Consider location/radiation/quality/duration/timing/severity/associated sxs/prior treatment) HPI This 41 year old female has several days of cough with nasal congestion chills generalized body aches and fatigue with no treatment prior to arrival. She is a tender spot inside her left nostril. She has no shortness breath no abdominal pain no vomiting no diarrhea no rash no confusion no stiff neck. There is no treatment prior to arrival. She has been coughing up some sputum. Her chest only hurts when she coughs. She is no dysuria and no vaginal discharge or bleeding. Past Medical History  Diagnosis Date  . Bronchitis   . Chronic bipolar disorder   . GERD (gastroesophageal reflux disease)   . Sleep apnea     CPAP  . Mental disorder     bipolar  . Anxiety   . Chronic kidney disease   . UTI (lower urinary tract infection) 10/2010  . Sickle cell trait   . Depression     bipolar  . Pyelonephritis 10/2010  . Shortness of breath     sometimes with exertion  . Recurrent upper respiratory infection (URI)     states she has not been to MD and feels like she has  bronchitis now- greenish sputum  . Blood transfusion     1989 at [place]  . Headache     occasional    Past Surgical History  Procedure Date  . Cesarean section   . Cesarean section   . Tubal ligation   . Orif tibia plateau 03/08/2011    Procedure: OPEN REDUCTION INTERNAL FIXATION (ORIF) TIBIAL PLATEAU;  Surgeon: Budd Palmer;  Location: [facility] OR;  Service: Orthopedics;  Laterality: Left;  . Knee arthroscopy 06/15/2011    Procedure: ARTHROSCOPY KNEE;  Surgeon: Budd Palmer, MD;  Location: [facility] OR;  Service: Orthopedics;  Laterality: Left;  LEFT KNEE SCOPE WITH LYSIS OF ADHESIONS AND MANIPULATION    Family History    Problem Relation Age of Onset  . Thyroid disease Father   . Diabetes Other   . Cancer Other   . Anesthesia problems Neg Hx     History  Substance Use Topics  . Smoking status: Current Every Day Smoker -- 0.5 packs/day for 24 years    Types: Cigarettes  . Smokeless tobacco: Never Used  . Alcohol Use: Yes     Comment: ocassion social    OB History    Grav Para Term Preterm Abortions TAB SAB Ect Mult Living                  Review of Systems 10 Systems reviewed and are negative for acute change except as noted in the HPI. Allergies  Aspirin; Banana; Latex; Penicillins; Septra; Shellfish allergy; and Strawberry  Home Medications   Current Outpatient Rx  Name  Route  Sig  Dispense  Refill  . TRAZODONE HCL 100 MG PO TABS   Oral   Take 50 mg by mouth at bedtime as needed. For sleep         . ALBUTEROL SULFATE HFA 108 (90 BASE) MCG/ACT IN AERS   Inhalation   Inhale 2 puffs into the lungs every 4 (four) hours as needed for wheezing or shortness of breath (cough).   1 Inhaler   0  BP 131/68  Pulse 75  Temp 97.9 F (36.6 C) (Oral)  Resp 15  SpO2 100%  LMP 03/01/2012  Physical Exam  Nursing note and vitals reviewed. Constitutional:       Awake, alert, nontoxic appearance.  HENT:  Head: Atraumatic.       No nasal abscess noted  Eyes: Right eye exhibits no discharge. Left eye exhibits no discharge.  Neck: Neck supple.  Cardiovascular: Normal rate and regular rhythm.   No murmur heard. Pulmonary/Chest: Effort normal and breath sounds normal. No respiratory distress. She has no wheezes. She has no rales. She exhibits tenderness.       Minimal diffuse chest wall tenderness  Abdominal: Soft. Bowel sounds are normal. She exhibits no distension. There is no tenderness. There is no rebound.  Musculoskeletal: She exhibits tenderness. She exhibits no edema.       Baseline ROM, no obvious new focal weakness. Minimal diffuse tenderness to all 4 extremities and back.   Neurological: She is alert.       Mental status and motor strength appears baseline for patient and situation.  Skin: No rash noted.  Psychiatric: She has a normal mood and affect.    ED Course  Procedures (including critical care time)  Labs Reviewed - No data to display Dg Chest 2 View  03/01/2012  *RADIOLOGY REPORT*  Clinical Data: Cough, congestion.  CHEST - 2 VIEW  Comparison: 06/13/2011  Findings: Heart and mediastinal contours are within normal limits. No focal opacities or effusions.  No acute bony abnormality.  IMPRESSION: No active cardiopulmonary disease.   Original Report Authenticated By: Charlett Nose, M.D.      1. Flu-like symptoms       MDM  Patient / Family / Caregiver informed of clinical course, understand medical decision-making process, and agree with plan.I doubt any other EMC precluding discharge at this time including, but not necessarily limited to the following:SBI.        Hurman Horn, MD 03/01/12 2100

## 2012-05-02 ENCOUNTER — Emergency Department (HOSPITAL_COMMUNITY): Payer: Self-pay

## 2012-05-02 ENCOUNTER — Emergency Department (HOSPITAL_COMMUNITY)
Admission: EM | Admit: 2012-05-02 | Discharge: 2012-05-03 | Disposition: A | Discharge status: Home / Self Care | Payer: Self-pay | Attending: Emergency Medicine | Admitting: Emergency Medicine

## 2012-05-02 ENCOUNTER — Encounter (HOSPITAL_COMMUNITY): Payer: Self-pay | Admitting: Emergency Medicine

## 2012-05-02 DIAGNOSIS — Z8719 Personal history of other diseases of the digestive system: Secondary | ICD-10-CM | POA: Insufficient documentation

## 2012-05-02 DIAGNOSIS — Z8709 Personal history of other diseases of the respiratory system: Secondary | ICD-10-CM | POA: Insufficient documentation

## 2012-05-02 DIAGNOSIS — J3489 Other specified disorders of nose and nasal sinuses: Secondary | ICD-10-CM | POA: Insufficient documentation

## 2012-05-02 DIAGNOSIS — F319 Bipolar disorder, unspecified: Secondary | ICD-10-CM | POA: Insufficient documentation

## 2012-05-02 DIAGNOSIS — H6500 Acute serous otitis media, unspecified ear: Secondary | ICD-10-CM | POA: Insufficient documentation

## 2012-05-02 DIAGNOSIS — R0602 Shortness of breath: Secondary | ICD-10-CM | POA: Insufficient documentation

## 2012-05-02 DIAGNOSIS — N189 Chronic kidney disease, unspecified: Secondary | ICD-10-CM | POA: Insufficient documentation

## 2012-05-02 DIAGNOSIS — F17200 Nicotine dependence, unspecified, uncomplicated: Secondary | ICD-10-CM | POA: Insufficient documentation

## 2012-05-02 DIAGNOSIS — Z87448 Personal history of other diseases of urinary system: Secondary | ICD-10-CM | POA: Insufficient documentation

## 2012-05-02 DIAGNOSIS — G4733 Obstructive sleep apnea (adult) (pediatric): Secondary | ICD-10-CM | POA: Insufficient documentation

## 2012-05-02 DIAGNOSIS — J00 Acute nasopharyngitis [common cold]: Secondary | ICD-10-CM | POA: Insufficient documentation

## 2012-05-02 DIAGNOSIS — J310 Chronic rhinitis: Secondary | ICD-10-CM

## 2012-05-02 DIAGNOSIS — D571 Sickle-cell disease without crisis: Secondary | ICD-10-CM | POA: Insufficient documentation

## 2012-05-02 DIAGNOSIS — Z87440 Personal history of urinary (tract) infections: Secondary | ICD-10-CM | POA: Insufficient documentation

## 2012-05-02 DIAGNOSIS — J40 Bronchitis, not specified as acute or chronic: Secondary | ICD-10-CM | POA: Insufficient documentation

## 2012-05-02 DIAGNOSIS — Z8659 Personal history of other mental and behavioral disorders: Secondary | ICD-10-CM | POA: Insufficient documentation

## 2012-05-02 DIAGNOSIS — R51 Headache: Secondary | ICD-10-CM | POA: Insufficient documentation

## 2012-05-02 NOTE — ED Notes (Signed)
PT. REPORTS PRODUCTIVE COUGH , NASAL CONGESTION , UPPER BACK PAIN AND CHEST CONGESTION FOR SEVERAL DAYS , STATES HISTORY OF BRONCHITIS / ASTHMA.

## 2012-05-03 MED ORDER — AMOXICILLIN 500 MG PO CAPS: 500.0000 mg | ORAL_CAPSULE | Freq: Three times a day (TID) | ORAL | Status: DC

## 2012-05-03 NOTE — ED Provider Notes (Signed)
History     CSN: [id number]  Arrival date & time 05/02/12  2223   First MD Initiated Contact with Patient 05/02/12 2353      Chief Complaint  Patient presents with  . Cough  . Nasal Congestion    (Consider location/radiation/quality/duration/timing/severity/associated sxs/prior treatment) HPI  Patient reports for the past week she's had an itchiness in her throat, some frontal headache, itching in her ears, and chest pain when she breathes. She indicates the right anterior chest and her back. She's had a cough but it sometimes produces light green mucus. She has hot and cold flashes but denies chills or documented fever. She states she sometimes feels short of breath but she does not have dyspnea on exertion or wheezing. She denies nausea, vomiting or diarrhea. She states she has chronic constipation her last bowel movement was the one and a half weeks ago.  PCP none  Past Medical History  Diagnosis Date  . Bronchitis   . Chronic bipolar disorder   . GERD (gastroesophageal reflux disease)   . Sleep apnea     CPAP  . Mental disorder     bipolar  . Anxiety   . Chronic kidney disease   . UTI (lower urinary tract infection) 10/2010  . Sickle cell trait   . Depression     bipolar  . Pyelonephritis 10/2010  . Shortness of breath     sometimes with exertion  . Recurrent upper respiratory infection (URI)     states she has not been to MD and feels like she has  bronchitis now- greenish sputum  . Blood transfusion     1989 at [place]  . Headache     occasional    Past Surgical History  Procedure Laterality Date  . Cesarean section    . Cesarean section    . Tubal ligation    . Orif tibia plateau  03/08/2011    Procedure: OPEN REDUCTION INTERNAL FIXATION (ORIF) TIBIAL PLATEAU;  Surgeon: Budd Palmer;  Location: [facility] OR;  Service: Orthopedics;  Laterality: Left;  . Knee arthroscopy  06/15/2011    Procedure: ARTHROSCOPY KNEE;  Surgeon: Budd Palmer, MD;  Location[facility]  OR;  Service: Orthopedics;  Laterality: Left;  LEFT KNEE SCOPE WITH LYSIS OF ADHESIONS AND MANIPULATION    Family History  Problem Relation Age of Onset  . Thyroid disease Father   . Diabetes Other   . Cancer Other   . Anesthesia problems Neg Hx     History  Substance Use Topics  . Smoking status: Current Every Day Smoker -- 0.50 packs/day for 24 years    Types: Cigarettes  . Smokeless tobacco: Never Used  . Alcohol Use: Yes     Comment: ocassion social  unemployed  OB History   Grav Para Term Preterm Abortions TAB SAB Ect Mult Living                  Review of Systems  All other systems reviewed and are negative.    Allergies  Aspirin; Banana; Latex; Penicillins; Septra; Shellfish allergy; and Strawberry  Home Medications   Current Outpatient Rx  Name  Route  Sig  Dispense  Refill  . Pseudoeph-CPM-DM-APAP (TYLENOL COLD) 30-2-15-325 MG TABS   Oral   Take 1 tablet by mouth every 4 (four) hours as needed (for cough).           BP 125/76  Pulse 89  Temp(Src) 97.7 F (36.5 C) (Oral)  Resp 16  SpO2 100%  LMP 04/25/2012  Vital signs normal    Physical Exam  Nursing note and vitals reviewed. Constitutional: She is oriented to person, place, and time. She appears well-developed and well-nourished.  Non-toxic appearance. She does not appear ill. No distress.  HENT:  Head: Normocephalic and atraumatic.  Right Ear: External ear normal.  Left Ear: External ear normal.  Nose: Nose normal. No mucosal edema or rhinorrhea.  Mouth/Throat: Oropharynx is clear and moist and mucous membranes are normal. No dental abscesses or edematous.  Pt has mild bogginess in the right nares, the left nares shows moderate swelling with minimal air flow, nontender frontal and maxillary sinuses, left TM has faint redness and has clear fluid behind the TM, Right TM in normal  Eyes: Conjunctivae and EOM are normal. Pupils are equal, round, and reactive to light.  Neck: Normal range of  motion and full passive range of motion without pain. Neck supple.  Cardiovascular: Normal rate, regular rhythm and normal heart sounds.  Exam reveals no gallop and no friction rub.   No murmur heard. Pulmonary/Chest: Effort normal and breath sounds normal. No respiratory distress. She has no wheezes. She has no rhonchi. She has no rales. She exhibits no tenderness and no crepitus.  Abdominal: Soft. Normal appearance and bowel sounds are normal. She exhibits no distension. There is no tenderness. There is no rebound and no guarding.  Musculoskeletal: Normal range of motion. She exhibits no edema and no tenderness.  Moves all extremities well.   Neurological: She is alert and oriented to person, place, and time. She has normal strength. No cranial nerve deficit.  Skin: Skin is warm, dry and intact. No rash noted. No erythema. No pallor.  Psychiatric: She has a normal mood and affect. Her speech is normal and behavior is normal. Her mood appears not anxious.    ED Course  Procedures (including critical care time)  Pt states she is only allergic to PCN, she can take amoxil/augmentin   Dg Chest 2 View  05/02/2012  *RADIOLOGY REPORT*  Clinical Data: 42 year old female chest pain and productive cough.  CHEST - 2 VIEW  Comparison: 03/01/2012 and earlier.  Findings: Stable and normal lung volumes. Normal cardiac size and mediastinal contours.  Visualized tracheal air column is within normal limits.  Lung parenchyma stable and clear. No acute osseous abnormality identified.  IMPRESSION: Negative, no acute cardiopulmonary abnormality.   Original Report Authenticated By: Erskine Speed, M.D.      1. Serous otitis media, left   2. Bronchitis   3. Rhinitis    New Prescriptions   AMOXICILLIN (AMOXIL) 500 MG CAPSULE    Take 1 capsule (500 mg total) by mouth 3 (three) times daily.  afrin claritin  Plan discharge  Devoria Albe, MD, FACEP     MDM          Ward Givens, MD 05/03/12 [phone]

## 2012-06-23 ENCOUNTER — Emergency Department (HOSPITAL_COMMUNITY)
Admission: EM | Admit: 2012-06-23 | Discharge: 2012-06-23 | Disposition: A | Discharge status: Home / Self Care | Payer: Self-pay

## 2012-06-23 NOTE — ED Notes (Signed)
No response in waiting room.

## 2012-06-24 ENCOUNTER — Emergency Department (HOSPITAL_COMMUNITY)
Admission: EM | Admit: 2012-06-24 | Discharge: 2012-06-24 | Disposition: A | Discharge status: Home / Self Care | Payer: Self-pay | Attending: Emergency Medicine | Admitting: Emergency Medicine

## 2012-06-24 ENCOUNTER — Emergency Department (HOSPITAL_COMMUNITY): Payer: Self-pay

## 2012-06-24 ENCOUNTER — Encounter (HOSPITAL_COMMUNITY): Payer: Self-pay

## 2012-06-24 DIAGNOSIS — Z862 Personal history of diseases of the blood and blood-forming organs and certain disorders involving the immune mechanism: Secondary | ICD-10-CM | POA: Insufficient documentation

## 2012-06-24 DIAGNOSIS — Z87440 Personal history of urinary (tract) infections: Secondary | ICD-10-CM | POA: Insufficient documentation

## 2012-06-24 DIAGNOSIS — Z3202 Encounter for pregnancy test, result negative: Secondary | ICD-10-CM | POA: Insufficient documentation

## 2012-06-24 DIAGNOSIS — Z8669 Personal history of other diseases of the nervous system and sense organs: Secondary | ICD-10-CM | POA: Insufficient documentation

## 2012-06-24 DIAGNOSIS — Z9981 Dependence on supplemental oxygen: Secondary | ICD-10-CM | POA: Insufficient documentation

## 2012-06-24 DIAGNOSIS — R110 Nausea: Secondary | ICD-10-CM | POA: Insufficient documentation

## 2012-06-24 DIAGNOSIS — Z8709 Personal history of other diseases of the respiratory system: Secondary | ICD-10-CM | POA: Insufficient documentation

## 2012-06-24 DIAGNOSIS — Z87448 Personal history of other diseases of urinary system: Secondary | ICD-10-CM | POA: Insufficient documentation

## 2012-06-24 DIAGNOSIS — F17200 Nicotine dependence, unspecified, uncomplicated: Secondary | ICD-10-CM | POA: Insufficient documentation

## 2012-06-24 DIAGNOSIS — Z79899 Other long term (current) drug therapy: Secondary | ICD-10-CM | POA: Insufficient documentation

## 2012-06-24 DIAGNOSIS — G4730 Sleep apnea, unspecified: Secondary | ICD-10-CM | POA: Insufficient documentation

## 2012-06-24 DIAGNOSIS — K219 Gastro-esophageal reflux disease without esophagitis: Secondary | ICD-10-CM | POA: Insufficient documentation

## 2012-06-24 DIAGNOSIS — R1013 Epigastric pain: Secondary | ICD-10-CM | POA: Insufficient documentation

## 2012-06-24 DIAGNOSIS — Z9851 Tubal ligation status: Secondary | ICD-10-CM | POA: Insufficient documentation

## 2012-06-24 DIAGNOSIS — F319 Bipolar disorder, unspecified: Secondary | ICD-10-CM | POA: Insufficient documentation

## 2012-06-24 DIAGNOSIS — R109 Unspecified abdominal pain: Secondary | ICD-10-CM

## 2012-06-24 LAB — URINALYSIS, ROUTINE W REFLEX MICROSCOPIC
Bilirubin Urine: NEGATIVE
Glucose, UA: NEGATIVE mg/dL
Hgb urine dipstick: NEGATIVE
Ketones, ur: NEGATIVE mg/dL
Leukocytes, UA: NEGATIVE
Nitrite: NEGATIVE
Protein, ur: NEGATIVE mg/dL
Specific Gravity, Urine: 1.026 (ref 1.005–1.030)
Urobilinogen, UA: 0.2 mg/dL (ref 0.0–1.0)
pH: 6 (ref 5.0–8.0)

## 2012-06-24 LAB — COMPREHENSIVE METABOLIC PANEL
ALT: 18 U/L (ref 0–35)
AST: 29 U/L (ref 0–37)
Albumin: 3.3 g/dL — ABNORMAL LOW (ref 3.5–5.2)
Alkaline Phosphatase: 105 U/L (ref 39–117)
BUN: 11 mg/dL (ref 6–23)
CO2: 26 mEq/L (ref 19–32)
Calcium: 8.8 mg/dL (ref 8.4–10.5)
Chloride: 105 mEq/L (ref 96–112)
Creatinine, Ser: 0.78 mg/dL (ref 0.50–1.10)
GFR calc Af Amer: >90 mL/min (ref >90)
GFR calc non Af Amer: >90 mL/min (ref >90)
Glucose, Bld: 107 mg/dL — ABNORMAL HIGH (ref 70–99)
Potassium: 4 mEq/L (ref 3.5–5.1)
Sodium: 138 mEq/L (ref 135–145)
Total Bilirubin: 0.2 mg/dL — ABNORMAL LOW (ref 0.3–1.2)
Total Protein: 6.8 g/dL (ref 6.0–8.3)

## 2012-06-24 LAB — CBC WITH DIFFERENTIAL/PLATELET
Basophils Absolute: 0.1 10*3/uL (ref 0.0–0.1)
Basophils Relative: 1 % (ref 0–1)
Eosinophils Absolute: 0.2 10*3/uL (ref 0.0–0.7)
Eosinophils Relative: 2 % (ref 0–5)
HCT: 35.7 % — ABNORMAL LOW (ref 36.0–46.0)
Hemoglobin: 12.7 g/dL (ref 12.0–15.0)
Lymphocytes Relative: 54 % — ABNORMAL HIGH (ref 12–46)
Lymphs Abs: 3.9 10*3/uL (ref 0.7–4.0)
MCH: 29.9 pg (ref 26.0–34.0)
MCHC: 35.6 g/dL (ref 30.0–36.0)
MCV: 84 fL (ref 78.0–100.0)
Monocytes Absolute: 0.4 10*3/uL (ref 0.1–1.0)
Monocytes Relative: 5 % (ref 3–12)
Neutro Abs: 2.7 10*3/uL (ref 1.7–7.7)
Neutrophils Relative %: 37 % — ABNORMAL LOW (ref 43–77)
Platelets: 312 10*3/uL (ref 150–400)
RBC: 4.25 MIL/uL (ref 3.87–5.11)
RDW: 14 % (ref 11.5–15.5)
WBC: 7.1 10*3/uL (ref 4.0–10.5)

## 2012-06-24 LAB — LIPASE, BLOOD: Lipase: 12 U/L (ref 11–59)

## 2012-06-24 LAB — POCT PREGNANCY, URINE: Preg Test, Ur: NEGATIVE

## 2012-06-24 MED ORDER — HYDROCODONE-ACETAMINOPHEN 5-325 MG PO TABS: 1.0000 | ORAL_TABLET | ORAL | Status: DC | PRN

## 2012-06-24 MED ORDER — SODIUM CHLORIDE 0.9 % IV BOLUS (SEPSIS)
1000.0000 mL | Freq: Once | INTRAVENOUS | Status: AC
  Administered 2012-06-24: 1000 mL via INTRAVENOUS

## 2012-06-24 MED ORDER — ONDANSETRON HCL 4 MG PO TABS: 4.0000 mg | ORAL_TABLET | Freq: Four times a day (QID) | ORAL | Status: DC

## 2012-06-24 MED ORDER — MORPHINE SULFATE 4 MG/ML IJ SOLN
2.0000 mg | Freq: Once | INTRAMUSCULAR | Status: AC
  Administered 2012-06-24: 2 mg via INTRAVENOUS
  Filled 2012-06-24: qty 1

## 2012-06-24 MED ORDER — ONDANSETRON HCL 4 MG/2ML IJ SOLN
4.0000 mg | INTRAMUSCULAR | Status: AC
  Administered 2012-06-24: 4 mg via INTRAVENOUS
  Filled 2012-06-24: qty 2

## 2012-06-24 MED ORDER — MORPHINE SULFATE 4 MG/ML IJ SOLN
4.0000 mg | Freq: Once | INTRAMUSCULAR | Status: AC
  Administered 2012-06-24: 4 mg via INTRAVENOUS
  Filled 2012-06-24: qty 1

## 2012-06-24 MED ORDER — GI COCKTAIL ~~LOC~~
30.0000 mL | Freq: Once | ORAL | Status: AC
  Administered 2012-06-24: 30 mL via ORAL
  Filled 2012-06-24: qty 30

## 2012-06-24 NOTE — ED Notes (Signed)
PA at bedside.

## 2012-06-24 NOTE — ED Notes (Signed)
Pt. C/o mid epigastric abdominal pain x1 month. Report nausea, no vomiting or diarrhea.

## 2012-06-24 NOTE — ED Notes (Signed)
Pt. C/o intermittent sharp mid epigastric pain x1 month. States "I thought it was just gas but it continues to get worse. I tried taking Prilosec but it didn't help".  Pt. Reports nausea, no vomiting or diarrhea. Pt. Denies chest pain/SOB or urinary symptoms. Pt. Tender to palpation. States pain is worse after eating.

## 2012-06-24 NOTE — ED Notes (Signed)
Pt ambulated to restroom to obtain urine specimen.

## 2012-06-24 NOTE — ED Provider Notes (Signed)
History     CSN: [id number]  Arrival date & time 06/24/12  0544   First MD Initiated Contact with Patient 06/24/12 0602      Chief Complaint  Patient presents with  . Abdominal Pain    (Consider location/radiation/quality/duration/timing/severity/associated sxs/prior treatment) HPI Comments: Patient is a 42 year old female who presents for pain to her epigastric region times one month. Patient states the pain is intermittent and waxing and waning in severity. Patient describes the pain as cramping with intermittent sharp sensations; pain is relieved when applying pressure to her epigastric region and worsened with eating. Patient states she has been taking Prilosec OTC for the last few weeks without relief of symptoms. She admits to associated nausea without emesis and admits to experiencing bouts of constipation. Denies fever, chest pain, shortness of breath, diarrhea, melena, hematochezia, urinary symptoms, vaginal discharge, numbness or tingling in her extremities. Patient states last BM was 2 days ago and was normal in color and consistency. She denies any hx of abdominal surgery.  Patient is a 42 y.o. female presenting with abdominal pain. The history is provided by the patient. No language interpreter was used.  Abdominal Pain Associated symptoms: nausea   Associated symptoms: no chest pain, no diarrhea, no dysuria, no fever, no hematuria, no shortness of breath, no vaginal discharge and no vomiting     Past Medical History  Diagnosis Date  . Bronchitis   . Chronic bipolar disorder   . GERD (gastroesophageal reflux disease)   . Sleep apnea     CPAP  . Mental disorder     bipolar  . Anxiety   . Chronic kidney disease   . UTI (lower urinary tract infection) 10/2010  . Sickle cell trait   . Depression     bipolar  . Pyelonephritis 10/2010  . Shortness of breath     sometimes with exertion  . Recurrent upper respiratory infection (URI)     states she has not been to MD and  feels like she has  bronchitis now- greenish sputum  . Blood transfusion     1989 at [place]  . Headache     occasional    Past Surgical History  Procedure Laterality Date  . Cesarean section    . Cesarean section    . Tubal ligation    . Orif tibia plateau  03/08/2011    Procedure: OPEN REDUCTION INTERNAL FIXATION (ORIF) TIBIAL PLATEAU;  Surgeon: Budd Palmer;  Location: [facility] OR;  Service: Orthopedics;  Laterality: Left;  . Knee arthroscopy  06/15/2011    Procedure: ARTHROSCOPY KNEE;  Surgeon: Budd Palmer, MD;  Location: [facility] OR;  Service: Orthopedics;  Laterality: Left;  LEFT KNEE SCOPE WITH LYSIS OF ADHESIONS AND MANIPULATION    Family History  Problem Relation Age of Onset  . Thyroid disease Father   . Diabetes Other   . Cancer Other   . Anesthesia problems Neg Hx     History  Substance Use Topics  . Smoking status: Current Every Day Smoker -- 0.50 packs/day for 24 years    Types: Cigarettes  . Smokeless tobacco: Never Used  . Alcohol Use: Yes     Comment: ocassion social    OB History   Grav Para Term Preterm Abortions TAB SAB Ect Mult Living                  Review of Systems  Constitutional: Negative for fever.  HENT: Negative for trouble swallowing and voice change.  Respiratory: Negative for chest tightness and shortness of breath.   Cardiovascular: Negative for chest pain.  Gastrointestinal: Positive for nausea and abdominal pain. Negative for vomiting, diarrhea and blood in stool.  Genitourinary: Negative for dysuria, hematuria and vaginal discharge.  Skin: Negative for color change and rash.  Neurological: Negative for syncope and numbness.  All other systems reviewed and are negative.    Allergies  Aspirin; Banana; Latex; Penicillins; Septra; Shellfish allergy; and Strawberry  Home Medications   Current Outpatient Rx  Name  Route  Sig  Dispense  Refill  . omeprazole (PRILOSEC OTC) 20 MG tablet   Oral   Take 20 mg by mouth daily as  needed (for heartburn).         Marland Kitchen HYDROcodone-acetaminophen (NORCO/VICODIN) 5-325 MG per tablet   Oral   Take 1 tablet by mouth every 4 (four) hours as needed for pain.   8 tablet   0   . ondansetron (ZOFRAN) 4 MG tablet   Oral   Take 1 tablet (4 mg total) by mouth every 6 (six) hours. For nausea or vomiting   12 tablet   0     BP 145/86  Pulse 71  Temp(Src) 98.6 F (37 C) (Oral)  Resp 16  SpO2 100%  LMP 06/23/2012  Physical Exam  Nursing note and vitals reviewed. Constitutional: She is oriented to person, place, and time. She appears well-developed and well-nourished. No distress.  Patient is an obese female, well-appearing, in no acute distress.  HENT:  Head: Normocephalic and atraumatic.  Mouth/Throat: Oropharynx is clear and moist. No oropharyngeal exudate.  Eyes: Conjunctivae are normal. Pupils are equal, round, and reactive to light. No scleral icterus.  Neck: Normal range of motion. Neck supple.  Cardiovascular: Normal rate, regular rhythm, normal heart sounds and intact distal pulses.   Pulmonary/Chest: Effort normal and breath sounds normal. No respiratory distress. She has no wheezes. She has no rales.  Abdominal: Soft. Bowel sounds are normal. She exhibits no pulsatile midline mass and no mass. There is tenderness in the right upper quadrant and epigastric area. There is no rebound, no guarding, no tenderness at McBurney's point and negative Murphy's sign.    No peritoneal signs  Musculoskeletal: Normal range of motion.  Lymphadenopathy:    She has no cervical adenopathy.  Neurological: She is alert and oriented to person, place, and time. She has normal reflexes.  Skin: Skin is warm and dry. No rash noted. She is not diaphoretic. No erythema.  Psychiatric: She has a normal mood and affect. Her behavior is normal.    ED Course  Procedures (including critical care time)  Labs Reviewed  CBC WITH DIFFERENTIAL - Abnormal; Notable for the following:    HCT  35.7 (*)    Neutrophils Relative 37 (*)    Lymphocytes Relative 54 (*)    All other components within normal limits  COMPREHENSIVE METABOLIC PANEL - Abnormal; Notable for the following:    Glucose, Bld 107 (*)    Albumin 3.3 (*)    Total Bilirubin 0.2 (*)    All other components within normal limits  URINALYSIS, ROUTINE W REFLEX MICROSCOPIC - Abnormal; Notable for the following:    APPearance HAZY (*)    All other components within normal limits  LIPASE, BLOOD  POCT PREGNANCY, URINE   US Abdomen Complete  06/24/2012  *RADIOLOGY REPORT*  Clinical Data:  Right upper quadrant pain.  COMPLETE ABDOMINAL ULTRASOUND  Comparison:  None.  Findings:  Gallbladder:  No gallstones,  gallbladder wall thickening, or pericholecystic fluid.  Common bile duct:  Normal measuring 3.7 mm.  Liver:  No focal lesion identified.  Within normal limits in parenchymal echogenicity.  IVC:  Appears normal.  Pancreas:  No focal abnormality seen.  Spleen:  Normal measuring 5.3 cm.  Right Kidney:  No mass or hydronephrosis.  No calculi.  Length 11.7 cm.  Left Kidney:  No mass or hydronephrosis.  No calculi.  Length 11.4 cm.  Abdominal aorta:  Not visualized distally.  Maximal diameter 2 cm.  IMPRESSION: Negative abdominal ultrasound.   Original Report Authenticated By: Davonna Belling, M.D.     1. Abdominal pain      MDM  Patient presents for pain to her epigastric region x 1 month that is intermittent, waxing and waning in severity, aching in nature with intermittent sharp pains, alleviated with pressure to the epigastric region and worse with eating. Patient's exam significant for tenderness in the epigastric region and RUQ without +murphy's sign or peritoneal signs. No palpable abdominal masses appreciated. W/u to include CBC, CMP, lipase, UA, and urine pregnancy; GI cocktail ordered for symptoms.  Patient states no improvement of symptoms with GI cocktail; 4mg  IV morphine and 4mg  IV zofran ordered. Patient well and nontoxic  appearing; hemodynamically stable. U/S gallbladder ordered for evaluation of gallbladder stones.  Patient's ultrasound without evidence of cholelithiasis or cholecystitis. Labs consistent with prior workup without evidence of leukocytosis, anemia, electrolyte imbalance, or abnormal liver or kidney functions. Patient is tolerating food and fluids by mouth and endorses improvement of her abdominal pain with morphine and Zofran. Patient afebrile with stable vital signs. Appropriate for discharge with pain medicine and GI followup for further evaluation of symptoms. Indications for ED return discussed. Patient states comfort and understanding with this d/c plan with no unaddressed concerns. Patient work up and management discussed with Dr. Adriana Simas who is in agreement.     Antony Madura, [place] 06/25/12 [phone]

## 2012-06-26 NOTE — ED Provider Notes (Signed)
Medical screening examination/treatment/procedure(s) were conducted as a shared visit with non-physician practitioner(s) and myself.  I personally evaluated the patient during the encounter.  No acute abdomen. Ultrasound negative for gallstones  Donnetta Hutching, MD 06/26/12 1126

## 2013-04-19 ENCOUNTER — Encounter (HOSPITAL_COMMUNITY): Payer: Self-pay | Admitting: Emergency Medicine

## 2013-04-19 ENCOUNTER — Emergency Department (HOSPITAL_COMMUNITY): Payer: Self-pay

## 2013-04-19 ENCOUNTER — Emergency Department (HOSPITAL_COMMUNITY)
Admission: EM | Admit: 2013-04-19 | Discharge: 2013-04-19 | Disposition: A | Discharge status: Home / Self Care | Payer: Self-pay | Attending: Emergency Medicine | Admitting: Emergency Medicine

## 2013-04-19 DIAGNOSIS — Z8719 Personal history of other diseases of the digestive system: Secondary | ICD-10-CM | POA: Insufficient documentation

## 2013-04-19 DIAGNOSIS — R079 Chest pain, unspecified: Secondary | ICD-10-CM

## 2013-04-19 DIAGNOSIS — M940 Chondrocostal junction syndrome [Tietze]: Secondary | ICD-10-CM | POA: Insufficient documentation

## 2013-04-19 DIAGNOSIS — Z8659 Personal history of other mental and behavioral disorders: Secondary | ICD-10-CM | POA: Insufficient documentation

## 2013-04-19 DIAGNOSIS — Z862 Personal history of diseases of the blood and blood-forming organs and certain disorders involving the immune mechanism: Secondary | ICD-10-CM | POA: Insufficient documentation

## 2013-04-19 DIAGNOSIS — Z91040 Latex allergy status: Secondary | ICD-10-CM | POA: Insufficient documentation

## 2013-04-19 DIAGNOSIS — Z8709 Personal history of other diseases of the respiratory system: Secondary | ICD-10-CM | POA: Insufficient documentation

## 2013-04-19 DIAGNOSIS — Z87440 Personal history of urinary (tract) infections: Secondary | ICD-10-CM | POA: Insufficient documentation

## 2013-04-19 DIAGNOSIS — G4730 Sleep apnea, unspecified: Secondary | ICD-10-CM | POA: Insufficient documentation

## 2013-04-19 DIAGNOSIS — E669 Obesity, unspecified: Secondary | ICD-10-CM | POA: Insufficient documentation

## 2013-04-19 DIAGNOSIS — N189 Chronic kidney disease, unspecified: Secondary | ICD-10-CM | POA: Insufficient documentation

## 2013-04-19 DIAGNOSIS — Z880 Allergy status to penicillin: Secondary | ICD-10-CM | POA: Insufficient documentation

## 2013-04-19 DIAGNOSIS — F17200 Nicotine dependence, unspecified, uncomplicated: Secondary | ICD-10-CM | POA: Insufficient documentation

## 2013-04-19 LAB — BASIC METABOLIC PANEL
BUN: 9 mg/dL (ref 6–23)
CO2: 22 mEq/L (ref 19–32)
Calcium: 9.1 mg/dL (ref 8.4–10.5)
Chloride: 103 mEq/L (ref 96–112)
Creatinine, Ser: 1.02 mg/dL (ref 0.50–1.10)
GFR calc Af Amer: 77 mL/min — ABNORMAL LOW (ref >90)
GFR calc non Af Amer: 66 mL/min — ABNORMAL LOW (ref >90)
Glucose, Bld: 106 mg/dL — ABNORMAL HIGH (ref 70–99)
Potassium: 4 mEq/L (ref 3.7–5.3)
Sodium: 138 mEq/L (ref 137–147)

## 2013-04-19 LAB — CBC
HCT: 40.6 % (ref 36.0–46.0)
Hemoglobin: 13.7 g/dL (ref 12.0–15.0)
MCH: 29.9 pg (ref 26.0–34.0)
MCHC: 33.7 g/dL (ref 30.0–36.0)
MCV: 88.6 fL (ref 78.0–100.0)
Platelets: 316 10*3/uL (ref 150–400)
RBC: 4.58 MIL/uL (ref 3.87–5.11)
RDW: 14 % (ref 11.5–15.5)
WBC: 7.8 10*3/uL (ref 4.0–10.5)

## 2013-04-19 LAB — POCT I-STAT TROPONIN I: Troponin i, poc: 0 ng/mL (ref 0.00–0.08)

## 2013-04-19 MED ORDER — IBUPROFEN 800 MG PO TABS: 800.0000 mg | ORAL_TABLET | Freq: Three times a day (TID) | ORAL | Status: DC

## 2013-04-19 MED ORDER — KETOROLAC TROMETHAMINE 60 MG/2ML IM SOLN
60.0000 mg | Freq: Once | INTRAMUSCULAR | Status: AC
  Administered 2013-04-19: 60 mg via INTRAMUSCULAR
  Filled 2013-04-19: qty 2

## 2013-04-19 NOTE — ED Provider Notes (Signed)
CSN: [id number]     Arrival date & time 04/19/13  1345 History   First MD Initiated Contact with Patient 04/19/13 1505     Chief Complaint  Patient presents with  . Chest Pain     (Consider location/radiation/quality/duration/timing/severity/associated sxs/prior Treatment) HPI Comments: 43 year old female with a past medical history of GERD, CKD, depression and anxiety who presents to the emergency department complaining of sudden onset midsternal chest pain radiating across the left side of her chest and below her breast beginning while she was resting last night, worsening into today. Pain is constant, not positional, worse with breathing. Denies associated shortness of breath, cough, fever, chills, nausea, vomiting or diaphoresis. She tried taking vinegar, baking soda and an antacid without relief. Denies ever having pain like this in the past. Denies personal or family history of early heart disease. No history of blood clots.  Patient is a 43 y.o. female presenting with chest pain. The history is provided by the patient.  Chest Pain   Past Medical History  Diagnosis Date  . Bronchitis   . Chronic bipolar disorder   . GERD (gastroesophageal reflux disease)   . Sleep apnea     CPAP  . Mental disorder     bipolar  . Anxiety   . Chronic kidney disease   . UTI (lower urinary tract infection) 10/2010  . Sickle cell trait   . Depression     bipolar  . Pyelonephritis 10/2010  . Shortness of breath     sometimes with exertion  . Recurrent upper respiratory infection (URI)     states she has not been to MD and feels like she has  bronchitis now- greenish sputum  . Blood transfusion     1989 at [place]  . Headache(784.0)     occasional   Past Surgical History  Procedure Laterality Date  . Cesarean section    . Cesarean section    . Tubal ligation    . Orif tibia plateau  03/08/2011    Procedure: OPEN REDUCTION INTERNAL FIXATION (ORIF) TIBIAL PLATEAU;  Surgeon: Rozanna Box;  Location: [facility];  Service: Orthopedics;  Laterality: Left;  . Knee arthroscopy  06/15/2011    Procedure: ARTHROSCOPY KNEE;  Surgeon: Rozanna Box, MD;  Location: [facility];  Service: Orthopedics;  Laterality: Left;  LEFT KNEE SCOPE WITH LYSIS OF ADHESIONS AND MANIPULATION   Family History  Problem Relation Age of Onset  . Thyroid disease Father   . Diabetes Other   . Cancer Other   . Anesthesia problems Neg Hx    History  Substance Use Topics  . Smoking status: Current Every Day Smoker -- 0.50 packs/day for 24 years    Types: Cigarettes  . Smokeless tobacco: Never Used  . Alcohol Use: Yes     Comment: ocassion social   OB History   Grav Para Term Preterm Abortions TAB SAB Ect Mult Living                 Review of Systems  Cardiovascular: Positive for chest pain.  All other systems reviewed and are negative.      Allergies  Aspirin; Banana; Latex; Penicillins; Septra; Shellfish allergy; Strawberry; and Tape  Home Medications   Current Outpatient Rx  Name  Route  Sig  Dispense  Refill  . ibuprofen (ADVIL,MOTRIN) 800 MG tablet   Oral   Take 1 tablet (800 mg total) by mouth 3 (three) times daily.   21 tablet  0    BP 132/72  Pulse 88  Temp(Src) 98.3 F (36.8 C) (Oral)  Resp 16  Ht 5\' 7"  (1.702 m)  Wt 233 lb 4.8 oz (105.824 kg)  BMI 36.53 kg/m2  SpO2 98%  LMP 04/07/2013 Physical Exam  Nursing note and vitals reviewed. Constitutional: She is oriented to person, place, and time. She appears well-developed and well-nourished. No distress.  Obese.  HENT:  Head: Normocephalic and atraumatic.  Mouth/Throat: Oropharynx is clear and moist.  Eyes: Conjunctivae and EOM are normal. Pupils are equal, round, and reactive to light.  Neck: Normal range of motion. Neck supple. No JVD present.  Cardiovascular: Normal rate, regular rhythm, normal heart sounds and intact distal pulses.   No extremity edema. Equal distal pulses.  Pulmonary/Chest: Effort normal and  breath sounds normal. She exhibits tenderness.    Abdominal: Soft. Bowel sounds are normal. There is no tenderness.  Musculoskeletal: Normal range of motion. She exhibits no edema.  Neurological: She is alert and oriented to person, place, and time. She has normal strength. No sensory deficit.  Moves limbs without ataxia. Speech fluent, goal oriented. Equal grip strength.  Skin: Skin is warm and dry. No rash noted. She is not diaphoretic.  Psychiatric: She has a normal mood and affect. Her behavior is normal.    ED Course  Procedures (including critical care time) Labs Review Labs Reviewed  BASIC METABOLIC PANEL - Abnormal; Notable for the following:    Glucose, Bld 106 (*)    GFR calc non Af Amer 66 (*)    GFR calc Af Amer 77 (*)    All other components within normal limits  CBC  POCT I-STAT TROPONIN I   Imaging Review Dg Chest 2 View  04/19/2013   CLINICAL DATA:  Left-sided chest pain 1 day.  EXAM: CHEST  2 VIEW  COMPARISON:  05/02/2012  FINDINGS: The heart size and mediastinal contours are within normal limits. Both lungs are clear. The visualized skeletal structures are unremarkable.  IMPRESSION: No active cardiopulmonary disease.   Electronically Signed   By: Marin Olp M.D.   On: 04/19/2013 14:54    EKG Interpretation   None      Date: 04/19/2013  Rate: 86  Rhythm: normal sinus rhythm  QRS Axis: normal  Intervals: normal  ST/T Wave abnormalities: normal  Conduction Disutrbances:none  Narrative Interpretation: NSR, normal EKG  Old EKG Reviewed: none available    MDM   Final diagnoses:  Costochondritis  Chest pain   Patient presenting with reproducible chest pain. She is well appearing and in no apparent distress. Afebrile with normal vital signs. PERC negative. Low risk heart score of 1. EKG normal. Labs obtained in triage prior to patient being seen, labs normal, troponin negative. Chest x-ray clear. Plan to give Toradol and reassess. 4:21 PM Patient  reports some improvement of symptoms after receiving Toradol. Stable for discharge. NSAIDs, rest. Resources given for followup. .Return precautions given. Patient states understanding of treatment care plan and is agreeable.   Illene Labrador, PA-C 04/19/13 1622

## 2013-04-19 NOTE — Discharge Instructions (Signed)
Take ibuprofen as prescribed as needed for pain. Rest, avoid heavy lifting or hard physical activity. Apply ice.  RESOURCE GUIDE  Chronic Pain Problems: Contact [facility]  [phone] Patients need to be referred by their primary care doctor.  Insufficient Money for Medicine: Contact [employer] Way:  call "211."   No Primary Care Doctor: - Call [facility]  [phone] - can help you locate a primary care doctor that  accepts your insurance, provides certain services, etc. - Physician Referral Service- [phone]  Agencies that provide inexpensive medical care: - [facility]  [phone] - [facility]  [phone] - [facility]  [phone] - [facility] Clinic  [phone] - [facility]  [phone] - [facility]  [phone]  Medicaid-accepting [facility] Providers: - [facility]- [address]  [phone], Mon-Fri 9am-7pm, Sat 9am-1pm - [facility]- [address] [place], [address] [place]  [phone] - [facility]- [address] [place]  [phone] [facility]- [address]  [phone] - Renaye Rakers- [address] [place], [address], [phone]  Only accepts [place] patients after they have their name  applied to their card  Self Pay (no insurance) in [place]: - Sickle Cell Patients: Dr Willey Blade, [facility]  [address] [place], [phone] - [facility]- [address] [place]  [phone]       [facility] [place]- [address]       -     [facility]- see information above (Speak to [employer] if you do not have insurance)       -  [facility]- [address],  [phone]       -  [facility]- [address] [phone]       -  Dr Julio Sicks-  [address], [place], [phone]       -  [facility] - [address] [phone]       -  [facility]- [address] [phone], also [address], [phone]       -    [facility]- [address] [place], [phone], 1st & 3rd Saturday        every month, 10am-1pm  1) Find a Doctor and Pay Out of Pocket Although you won't have to find out who is covered by your insurance plan, it is a good idea to ask around and get recommendations. You will then need to call the office and see if the doctor you have chosen will accept you as a new patient and what types of options they offer for patients who are self-pay. Some doctors offer discounts or will set up payment plans for their patients who do not have insurance, but you will need to ask so you aren't surprised when you get to your appointment.  2) Contact Your Local Health Department Not all health departments have doctors that can see patients for sick visits, but many do, so it is worth a call to see if yours does. If you don't know where your local health department is, you can check in your phone book. The CDC also has a tool to help you locate your state's health department, and  many state websites also have listings of all of their local health departments.  3) Find a [facility] If your illness is not likely to be very severe or complicated, you may want to try a walk in clinic. These are popping up all over the country in pharmacies, drugstores, and shopping centers. They're usually staffed by nurse practitioners or physician assistants that have been trained to treat common illnesses and complaints. They're usually fairly quick and inexpensive. However, if you have serious medical issues or chronic medical problems, these are probably not your best option    Costochondritis Costochondritis, sometimes called Tietze syndrome, is a swelling and irritation (inflammation) of the tissue (cartilage) that connects your ribs with your  breastbone (sternum). It causes pain in the chest and rib area. Costochondritis usually goes away on its own over time. It can take up to 6 weeks or longer to get better, especially if you are unable to limit your activities. CAUSES  Some cases of costochondritis have no known cause. Possible causes include:  Injury (trauma).  Exercise or activity such as lifting.  Severe coughing. SIGNS AND SYMPTOMS  Pain and tenderness in the chest and rib area.  Pain that gets worse when coughing or taking deep breaths.  Pain that gets worse with specific movements. DIAGNOSIS  Your health care provider will do a physical exam and ask about your symptoms. Chest X-rays or other tests may be done to rule out other problems. TREATMENT  Costochondritis usually goes away on its own over time. Your health care provider may prescribe medicine to help relieve pain. HOME CARE INSTRUCTIONS   Avoid exhausting physical activity. Try not to strain your ribs during normal activity. This would include any activities using chest, abdominal, and side muscles, especially if heavy weights are used.  Apply ice to the affected area for the first 2 days after the pain begins.  Put ice in a plastic bag.  Place a towel between your skin and the bag.  Leave the ice on for 20 minutes, 2 3 times a day.  Only take over-the-counter or prescription medicines as directed by your health care provider. SEEK MEDICAL CARE IF:  You have redness or swelling at the rib joints. These are signs of infection.  Your pain does not go away despite rest or medicine. SEEK IMMEDIATE MEDICAL CARE IF:   Your pain increases or you are very uncomfortable.  You have shortness of breath or difficulty breathing.  You cough up blood.  You have worse chest pains, sweating, or vomiting.  You have a fever or persistent symptoms for more than 2 3 days.  You have a fever and your symptoms suddenly get worse. MAKE SURE YOU:   Understand  these instructions.  Will watch your condition.  Will get help right away if you are not doing well or get worse. Document Released: 11/29/2004 Document Revised: 12/10/2012 Document Reviewed: 09/23/2012 [facility] Patient Information 2014 [place].  Chest Pain (Nonspecific) It is often hard to give a specific diagnosis for the cause of chest pain. There is always a chance that your pain could be related to something serious, such as a heart attack or a blood clot in the lungs. You need to follow up with your caregiver for further evaluation. CAUSES   Heartburn.  Pneumonia or bronchitis.  Anxiety or stress.  Inflammation around your heart (pericarditis) or lung (pleuritis or pleurisy).  A blood clot in the lung.  A collapsed lung (pneumothorax). It can develop suddenly  on its own (spontaneous pneumothorax) or from injury (trauma) to the chest.  Shingles infection (herpes zoster virus). The chest wall is composed of bones, muscles, and cartilage. Any of these can be the source of the pain.  The bones can be bruised by injury.  The muscles or cartilage can be strained by coughing or overwork.  The cartilage can be affected by inflammation and become sore (costochondritis). DIAGNOSIS  Lab tests or other studies, such as X-rays, electrocardiography, stress testing, or cardiac imaging, may be needed to find the cause of your pain.  TREATMENT   Treatment depends on what may be causing your chest pain. Treatment may include:  Acid blockers for heartburn.  Anti-inflammatory medicine.  Pain medicine for inflammatory conditions.  Antibiotics if an infection is present.  You may be advised to change lifestyle habits. This includes stopping smoking and avoiding alcohol, caffeine, and chocolate.  You may be advised to keep your head raised (elevated) when sleeping. This reduces the chance of acid going backward from your stomach into your esophagus.  Most of the time,  nonspecific chest pain will improve within 2 to 3 days with rest and mild pain medicine. HOME CARE INSTRUCTIONS   If antibiotics were prescribed, take your antibiotics as directed. Finish them even if you start to feel better.  For the next few days, avoid physical activities that bring on chest pain. Continue physical activities as directed.  Do not smoke.  Avoid drinking alcohol.  Only take over-the-counter or prescription medicine for pain, discomfort, or fever as directed by your caregiver.  Follow your caregiver's suggestions for further testing if your chest pain does not go away.  Keep any follow-up appointments you made. If you do not go to an appointment, you could develop lasting (chronic) problems with pain. If there is any problem keeping an appointment, you must call to reschedule. SEEK MEDICAL CARE IF:   You think you are having problems from the medicine you are taking. Read your medicine instructions carefully.  Your chest pain does not go away, even after treatment.  You develop a rash with blisters on your chest. SEEK IMMEDIATE MEDICAL CARE IF:   You have increased chest pain or pain that spreads to your arm, neck, jaw, back, or abdomen.  You develop shortness of breath, an increasing cough, or you are coughing up blood.  You have severe back or abdominal pain, feel nauseous, or vomit.  You develop severe weakness, fainting, or chills.  You have a fever. THIS IS AN EMERGENCY. Do not wait to see if the pain will go away. Get medical help at once. Call your local emergency services (911 in [place] Do not drive yourself to the hospital. MAKE SURE YOU:   Understand these instructions.  Will watch your condition.  Will get help right away if you are not doing well or get worse. Document Released: 11/29/2004 Document Revised: 05/14/2011 Document Reviewed: 09/25/2007 [facility] Patient Information 2014 [place].

## 2013-04-19 NOTE — ED Notes (Signed)
C/o midsternal cp radiating into L side of her body onset while at rest last night. Pain has been constant since. Hurts to breathe

## 2013-04-20 ENCOUNTER — Emergency Department (HOSPITAL_COMMUNITY)
Admission: EM | Admit: 2013-04-20 | Discharge: 2013-04-20 | Discharge status: Against Medical Advice | Payer: Self-pay | Attending: Emergency Medicine | Admitting: Emergency Medicine

## 2013-04-20 ENCOUNTER — Encounter (HOSPITAL_COMMUNITY): Payer: Self-pay | Admitting: Emergency Medicine

## 2013-04-20 DIAGNOSIS — N189 Chronic kidney disease, unspecified: Secondary | ICD-10-CM | POA: Insufficient documentation

## 2013-04-20 DIAGNOSIS — R079 Chest pain, unspecified: Secondary | ICD-10-CM | POA: Insufficient documentation

## 2013-04-20 DIAGNOSIS — F17200 Nicotine dependence, unspecified, uncomplicated: Secondary | ICD-10-CM | POA: Insufficient documentation

## 2013-04-20 NOTE — ED Notes (Signed)
Pt is here with mid upper abdominal pain and pain radiates into chest.  Pt states food sits in mid upper abdominal pain after eating and feels like it does not move.  Seen here yesterday

## 2013-04-20 NOTE — ED Provider Notes (Signed)
Medical screening examination/treatment/procedure(s) were performed by non-physician practitioner and as supervising physician I was immediately available for consultation/collaboration.  EKG Interpretation   None         Delice Bison Haili Donofrio, DO 04/20/13 0236

## 2013-05-19 ENCOUNTER — Encounter (HOSPITAL_COMMUNITY): Payer: Self-pay | Admitting: Emergency Medicine

## 2013-05-19 DIAGNOSIS — R109 Unspecified abdominal pain: Secondary | ICD-10-CM | POA: Insufficient documentation

## 2013-05-19 DIAGNOSIS — N189 Chronic kidney disease, unspecified: Secondary | ICD-10-CM | POA: Insufficient documentation

## 2013-05-19 NOTE — ED Notes (Signed)
Pt. Reports chronic mid/upper abdominal pain with nausea for several months worse these past several days , denies fever or chills. No emesis or diarrhea.

## 2013-05-20 ENCOUNTER — Emergency Department (HOSPITAL_COMMUNITY)
Admission: EM | Admit: 2013-05-20 | Discharge: 2013-05-20 | Discharge status: Against Medical Advice | Payer: Self-pay | Attending: Emergency Medicine | Admitting: Emergency Medicine

## 2013-05-20 ENCOUNTER — Emergency Department (HOSPITAL_COMMUNITY)
Admission: EM | Admit: 2013-05-20 | Discharge: 2013-05-20 | Disposition: A | Discharge status: Home / Self Care | Payer: Self-pay | Attending: Emergency Medicine | Admitting: Emergency Medicine

## 2013-05-20 ENCOUNTER — Encounter (HOSPITAL_COMMUNITY): Payer: Self-pay | Admitting: Emergency Medicine

## 2013-05-20 ENCOUNTER — Emergency Department (HOSPITAL_COMMUNITY): Payer: Self-pay

## 2013-05-20 DIAGNOSIS — Z880 Allergy status to penicillin: Secondary | ICD-10-CM | POA: Insufficient documentation

## 2013-05-20 DIAGNOSIS — R1013 Epigastric pain: Secondary | ICD-10-CM | POA: Insufficient documentation

## 2013-05-20 DIAGNOSIS — F17200 Nicotine dependence, unspecified, uncomplicated: Secondary | ICD-10-CM | POA: Insufficient documentation

## 2013-05-20 DIAGNOSIS — Z8742 Personal history of other diseases of the female genital tract: Secondary | ICD-10-CM | POA: Insufficient documentation

## 2013-05-20 DIAGNOSIS — Z91040 Latex allergy status: Secondary | ICD-10-CM | POA: Insufficient documentation

## 2013-05-20 DIAGNOSIS — Z8659 Personal history of other mental and behavioral disorders: Secondary | ICD-10-CM | POA: Insufficient documentation

## 2013-05-20 DIAGNOSIS — Z862 Personal history of diseases of the blood and blood-forming organs and certain disorders involving the immune mechanism: Secondary | ICD-10-CM | POA: Insufficient documentation

## 2013-05-20 DIAGNOSIS — G4730 Sleep apnea, unspecified: Secondary | ICD-10-CM | POA: Insufficient documentation

## 2013-05-20 DIAGNOSIS — R109 Unspecified abdominal pain: Secondary | ICD-10-CM

## 2013-05-20 DIAGNOSIS — N189 Chronic kidney disease, unspecified: Secondary | ICD-10-CM | POA: Insufficient documentation

## 2013-05-20 DIAGNOSIS — Z9981 Dependence on supplemental oxygen: Secondary | ICD-10-CM | POA: Insufficient documentation

## 2013-05-20 DIAGNOSIS — R110 Nausea: Secondary | ICD-10-CM | POA: Insufficient documentation

## 2013-05-20 DIAGNOSIS — Z8709 Personal history of other diseases of the respiratory system: Secondary | ICD-10-CM | POA: Insufficient documentation

## 2013-05-20 DIAGNOSIS — Z8719 Personal history of other diseases of the digestive system: Secondary | ICD-10-CM | POA: Insufficient documentation

## 2013-05-20 LAB — COMPREHENSIVE METABOLIC PANEL
ALT: 19 U/L (ref 0–35)
ALT: 20 U/L (ref 0–35)
AST: 36 U/L (ref 0–37)
AST: 39 U/L — ABNORMAL HIGH (ref 0–37)
Albumin: 3.6 g/dL (ref 3.5–5.2)
Albumin: 4 g/dL (ref 3.5–5.2)
Alkaline Phosphatase: 109 U/L (ref 39–117)
Alkaline Phosphatase: 120 U/L — ABNORMAL HIGH (ref 39–117)
BUN: 10 mg/dL (ref 6–23)
BUN: 9 mg/dL (ref 6–23)
CO2: 22 mEq/L (ref 19–32)
CO2: 23 mEq/L (ref 19–32)
Calcium: 8.9 mg/dL (ref 8.4–10.5)
Calcium: 9 mg/dL (ref 8.4–10.5)
Chloride: 102 mEq/L (ref 96–112)
Chloride: 103 mEq/L (ref 96–112)
Creatinine, Ser: 0.69 mg/dL (ref 0.50–1.10)
Creatinine, Ser: 0.75 mg/dL (ref 0.50–1.10)
GFR calc Af Amer: >90 mL/min (ref >90)
GFR calc Af Amer: >90 mL/min (ref >90)
GFR calc non Af Amer: >90 mL/min (ref >90)
GFR calc non Af Amer: >90 mL/min (ref >90)
Glucose, Bld: 100 mg/dL — ABNORMAL HIGH (ref 70–99)
Glucose, Bld: 88 mg/dL (ref 70–99)
Potassium: 3.7 mEq/L (ref 3.7–5.3)
Potassium: 3.9 mEq/L (ref 3.7–5.3)
Sodium: 139 mEq/L (ref 137–147)
Sodium: 140 mEq/L (ref 137–147)
Total Bilirubin: 0.3 mg/dL (ref 0.3–1.2)
Total Bilirubin: <0.2 mg/dL — ABNORMAL LOW (ref 0.3–1.2)
Total Protein: 7.1 g/dL (ref 6.0–8.3)
Total Protein: 7.8 g/dL (ref 6.0–8.3)

## 2013-05-20 LAB — URINALYSIS, ROUTINE W REFLEX MICROSCOPIC
Bilirubin Urine: NEGATIVE
Glucose, UA: NEGATIVE mg/dL
Glucose, UA: NEGATIVE mg/dL
Hgb urine dipstick: NEGATIVE
Hgb urine dipstick: NEGATIVE
Ketones, ur: NEGATIVE mg/dL
Ketones, ur: NEGATIVE mg/dL
Nitrite: NEGATIVE
Nitrite: NEGATIVE
Protein, ur: NEGATIVE mg/dL
Protein, ur: NEGATIVE mg/dL
Specific Gravity, Urine: 1.014 (ref 1.005–1.030)
Specific Gravity, Urine: 1.034 — ABNORMAL HIGH (ref 1.005–1.030)
Urobilinogen, UA: 0.2 mg/dL (ref 0.0–1.0)
Urobilinogen, UA: 1 mg/dL (ref 0.0–1.0)
pH: 6 (ref 5.0–8.0)
pH: 6 (ref 5.0–8.0)

## 2013-05-20 LAB — CBC WITH DIFFERENTIAL/PLATELET
Basophils Absolute: 0 10*3/uL (ref 0.0–0.1)
Basophils Absolute: 0 10*3/uL (ref 0.0–0.1)
Basophils Relative: 0 % (ref 0–1)
Basophils Relative: 1 % (ref 0–1)
Eosinophils Absolute: 0.2 10*3/uL (ref 0.0–0.7)
Eosinophils Absolute: 0.2 10*3/uL (ref 0.0–0.7)
Eosinophils Relative: 2 % (ref 0–5)
Eosinophils Relative: 2 % (ref 0–5)
HCT: 38.1 % (ref 36.0–46.0)
HCT: 40.3 % (ref 36.0–46.0)
Hemoglobin: 13 g/dL (ref 12.0–15.0)
Hemoglobin: 13.8 g/dL (ref 12.0–15.0)
Lymphocytes Relative: 35 % (ref 12–46)
Lymphocytes Relative: 53 % — ABNORMAL HIGH (ref 12–46)
Lymphs Abs: 2.9 10*3/uL (ref 0.7–4.0)
Lymphs Abs: 4.7 10*3/uL — ABNORMAL HIGH (ref 0.7–4.0)
MCH: 29.9 pg (ref 26.0–34.0)
MCH: 30.1 pg (ref 26.0–34.0)
MCHC: 34.1 g/dL (ref 30.0–36.0)
MCHC: 34.2 g/dL (ref 30.0–36.0)
MCV: 87.6 fL (ref 78.0–100.0)
MCV: 88 fL (ref 78.0–100.0)
Monocytes Absolute: 0.6 10*3/uL (ref 0.1–1.0)
Monocytes Absolute: 0.6 10*3/uL (ref 0.1–1.0)
Monocytes Relative: 6 % (ref 3–12)
Monocytes Relative: 7 % (ref 3–12)
Neutro Abs: 3.4 10*3/uL (ref 1.7–7.7)
Neutro Abs: 4.5 10*3/uL (ref 1.7–7.7)
Neutrophils Relative %: 39 % — ABNORMAL LOW (ref 43–77)
Neutrophils Relative %: 55 % (ref 43–77)
Platelets: 307 10*3/uL (ref 150–400)
Platelets: 334 10*3/uL (ref 150–400)
RBC: 4.35 MIL/uL (ref 3.87–5.11)
RBC: 4.58 MIL/uL (ref 3.87–5.11)
RDW: 14.3 % (ref 11.5–15.5)
RDW: 14.4 % (ref 11.5–15.5)
WBC: 8.1 10*3/uL (ref 4.0–10.5)
WBC: 8.8 10*3/uL (ref 4.0–10.5)

## 2013-05-20 LAB — URINE MICROSCOPIC-ADD ON

## 2013-05-20 LAB — POC URINE PREG, ED: Preg Test, Ur: NEGATIVE

## 2013-05-20 LAB — LIPASE, BLOOD: Lipase: 13 U/L (ref 11–59)

## 2013-05-20 MED ORDER — PROMETHAZINE HCL 25 MG PO TABS: 25.0000 mg | ORAL_TABLET | Freq: Four times a day (QID) | ORAL | Status: DC | PRN

## 2013-05-20 MED ORDER — PANTOPRAZOLE SODIUM 20 MG PO TBEC
20.0000 mg | DELAYED_RELEASE_TABLET | Freq: Once | ORAL | Status: AC
  Administered 2013-05-20: 20 mg via ORAL
  Filled 2013-05-20: qty 1

## 2013-05-20 MED ORDER — OMEPRAZOLE 20 MG PO CPDR: 20.0000 mg | DELAYED_RELEASE_CAPSULE | Freq: Every day | ORAL | Status: DC

## 2013-05-20 MED ORDER — ONDANSETRON 4 MG PO TBDP
4.0000 mg | ORAL_TABLET | Freq: Once | ORAL | Status: AC
  Administered 2013-05-20: 4 mg via ORAL
  Filled 2013-05-20: qty 1

## 2013-05-20 MED ORDER — MORPHINE SULFATE 4 MG/ML IJ SOLN
4.0000 mg | Freq: Once | INTRAMUSCULAR | Status: AC
  Administered 2013-05-20: 4 mg via INTRAVENOUS
  Filled 2013-05-20: qty 1

## 2013-05-20 NOTE — ED Provider Notes (Signed)
CSN: [id number]     Arrival date & time 05/20/13  0417 History   First MD Initiated Contact with Patient 05/20/13 [phone]     Chief Complaint  Patient presents with  . Abdominal Pain     (Consider location/radiation/quality/duration/timing/severity/associated sxs/prior Treatment) HPI Comments: Patient is a 43 year old female with a past medical history of GERD, bipolar disorder, CKD who presents with a 2 month history of abdominal pain. The pain is located in the epigastrium and does not radiate. The pain is described as cramping and severe. The pain started gradually and progressively worsened since the onset. No alleviating/aggravating factors. The patient has tried nothing for symptoms without relief. Associated symptoms include nausea. Patient denies fever, headache, vomiting, diarrhea, chest pain, SOB, dysuria, constipation, abnormal vaginal bleeding/discharge.      Past Medical History  Diagnosis Date  . Bronchitis   . Chronic bipolar disorder   . GERD (gastroesophageal reflux disease)   . Sleep apnea     CPAP  . Mental disorder     bipolar  . Anxiety   . Chronic kidney disease   . UTI (lower urinary tract infection) 10/2010  . Sickle cell trait   . Depression     bipolar  . Pyelonephritis 10/2010  . Shortness of breath     sometimes with exertion  . Recurrent upper respiratory infection (URI)     states she has not been to MD and feels like she has  bronchitis now- greenish sputum  . Blood transfusion     1989 at [place]  . Headache(784.0)     occasional   Past Surgical History  Procedure Laterality Date  . Cesarean section    . Cesarean section    . Tubal ligation    . Orif tibia plateau  03/08/2011    Procedure: OPEN REDUCTION INTERNAL FIXATION (ORIF) TIBIAL PLATEAU;  Surgeon: Rozanna Box;  Location: [facility];  Service: Orthopedics;  Laterality: Left;  . Knee arthroscopy  06/15/2011    Procedure: ARTHROSCOPY KNEE;  Surgeon: Rozanna Box, MD;  Location: [place];  Service: Orthopedics;  Laterality: Left;  LEFT KNEE SCOPE WITH LYSIS OF ADHESIONS AND MANIPULATION   Family History  Problem Relation Age of Onset  . Thyroid disease Father   . Diabetes Other   . Cancer Other   . Anesthesia problems Neg Hx    History  Substance Use Topics  . Smoking status: Current Every Day Smoker -- 0.50 packs/day for 24 years    Types: Cigarettes  . Smokeless tobacco: Never Used  . Alcohol Use: Yes     Comment: ocassion social   OB History   Grav Para Term Preterm Abortions TAB SAB Ect Mult Living                 Review of Systems  Constitutional: Negative for fever, chills and fatigue.  HENT: Negative for trouble swallowing.   Eyes: Negative for visual disturbance.  Respiratory: Negative for shortness of breath.   Cardiovascular: Negative for chest pain and palpitations.  Gastrointestinal: Positive for nausea and abdominal pain. Negative for vomiting and diarrhea.  Genitourinary: Negative for dysuria and difficulty urinating.  Musculoskeletal: Negative for arthralgias and neck pain.  Skin: Negative for color change.  Neurological: Negative for dizziness and weakness.  Psychiatric/Behavioral: Negative for dysphoric mood.      Allergies  Aspirin; Banana; Latex; Penicillins; Septra; Shellfish allergy; Strawberry; and Tape  Home Medications  No current outpatient prescriptions on file. BP  119/59  Pulse 75  Temp(Src) 97.8 F (36.6 C) (Oral)  Resp 18  SpO2 100%  LMP 04/24/2013 Physical Exam  Nursing note and vitals reviewed. Constitutional: She is oriented to person, place, and time. She appears well-developed and well-nourished. No distress.  HENT:  Head: Normocephalic and atraumatic.  Eyes: Conjunctivae and EOM are normal.  Neck: Normal range of motion.  Cardiovascular: Normal rate and regular rhythm.  Exam reveals no gallop and no friction rub.   No murmur heard. Pulmonary/Chest: Effort normal and breath sounds normal. She has no  wheezes. She has no rales. She exhibits no tenderness.  Abdominal: Soft. She exhibits no distension. There is tenderness. There is no rebound and no guarding.  Epigastric and RUQ tenderness to palpation. No peritoneal signs or other focal tenderness.   Musculoskeletal: Normal range of motion.  Neurological: She is alert and oriented to person, place, and time. Coordination normal.  Speech is goal-oriented. Moves limbs without ataxia.   Skin: Skin is warm and dry.  Psychiatric: She has a normal mood and affect. Her behavior is normal.    ED Course  Procedures (including critical care time) Labs Review Labs Reviewed  COMPREHENSIVE METABOLIC PANEL - Abnormal; Notable for the following:    Glucose, Bld 100 (*)    All other components within normal limits  URINALYSIS, ROUTINE W REFLEX MICROSCOPIC - Abnormal; Notable for the following:    APPearance CLOUDY (*)    Specific Gravity, Urine 1.034 (*)    Bilirubin Urine SMALL (*)    Leukocytes, UA SMALL (*)    All other components within normal limits  URINE MICROSCOPIC-ADD ON - Abnormal; Notable for the following:    Squamous Epithelial / LPF MANY (*)    Bacteria, UA FEW (*)    All other components within normal limits  CBC WITH DIFFERENTIAL  LIPASE, BLOOD   Imaging Review US Abdomen Complete  05/20/2013   CLINICAL DATA:  Abdominal pain. Evaluate for cholelithiasis/ cholecystitis.  EXAM: ULTRASOUND ABDOMEN COMPLETE  COMPARISON:  Abdominal ultrasound 06/24/2012  FINDINGS: Gallbladder:  No gallstones or wall thickening visualized. No sonographic Murphy sign noted.  Common bile duct:  Diameter: 3 mm  Liver:  Mildly echogenic diffusely without focal lesion identified.  IVC:  No abnormality visualized.  Pancreas:  Not well seen due to overlying bowel gas.  Spleen:  Size and appearance within normal limits.  Right Kidney:  Length: 11.5 cm. Echogenicity within normal limits. No mass or hydronephrosis visualized.  Left Kidney:  Length: 11.2 cm.  Echogenicity within normal limits. No mass or hydronephrosis visualized.  Abdominal aorta:  2.2 cm maximal diameter proximally. Not well visualized distally due to bowel gas.  Other findings:  None.  IMPRESSION: 1. Unremarkable appearance of the gallbladder. No biliary dilatation. 2. Mildly echogenic liver, nonspecific but can be seen with hepatic steatosis.   Electronically Signed   By: Logan Bores   On: 05/20/2013 11:51     EKG Interpretation None      MDM   Final diagnoses:  Abdominal pain    7:31 AM Labs and urinalysis pending. Vitals stable and patient afebrile. Patient given zofran and protonix PO. Patient continues to complain of pain and will have morphine IV.   9:42 AM Patient continues to have pain. I will order more pain medication and RUQ [place].   12:23 PM RUQ shows no gallbladder abnormality. Patient will be discharged with omeprazole to take daily and phenergan for nausea. Vitals stable and patient afebrile. Patient advised to  follow up with PCP.   Alvina Chou, PA-C 05/20/13 1230

## 2013-05-20 NOTE — Discharge Instructions (Signed)
Take Omeprazole daily to reduce stomach acid production. Take Phenergan as needed for nausea. Refer to attached documents for more information. Follow up with a primary care physician from the resource guide for further evaluation and management.    Emergency Department Resource Guide 1) Find a Doctor and Pay Out of Pocket Although you won't have to find out who is covered by your insurance plan, it is a good idea to ask around and get recommendations. You will then need to call the office and see if the doctor you have chosen will accept you as a new patient and what types of options they offer for patients who are self-pay. Some doctors offer discounts or will set up payment plans for their patients who do not have insurance, but you will need to ask so you aren't surprised when you get to your appointment.  2) Contact Your Local Health Department Not all health departments have doctors that can see patients for sick visits, but many do, so it is worth a call to see if yours does. If you don't know where your local health department is, you can check in your phone book. The CDC also has a tool to help you locate your state's health department, and many state websites also have listings of all of their local health departments.  3) Find a [facility] If your illness is not likely to be very severe or complicated, you may want to try a walk in clinic. These are popping up all over the country in pharmacies, drugstores, and shopping centers. They're usually staffed by nurse practitioners or physician assistants that have been trained to treat common illnesses and complaints. They're usually fairly quick and inexpensive. However, if you have serious medical issues or chronic medical problems, these are probably not your best option.  No Primary Care Doctor: - Call Health Connect at  [phone] - they can help you locate a primary care doctor that  accepts your insurance, provides certain services,  etc. - Physician Referral Service- [phone]  Chronic Pain Problems: Organization         Address  Phone   Notes  [facility]  [phone] Patients need to be referred by their primary care doctor.   Medication Assistance: Organization         Address  Phone   Notes  [facility] Medication [facility] [address] [place], [place] [address] [phone] --Must be a resident of [facility] -- Must have NO insurance coverage whatsoever (no Medicaid/ Medicare, etc.) -- The pt. MUST have a primary care doctor that directs their care regularly and follows them in the community   Med[facility]ist  [phone]   [employer]  [phone]    Agencies that provide inexpensive medical care: Organization         Address  Phone   Notes  [place]  [phone]   [facility]    [phone]   [facility] [place], [place] [address] [phone]   [place] [address] [place] [phone]   Planned Parenthood    [phone]   [facility]    [phone]   [place] and [place] [address],  Phone:  [phone], Fax:  [phone] Hours of Operation:  9 am - 6 pm, M-F.  Also accepts Medicaid/Medicare and self-pay.  [facility] [address], [address], [place] Phone: [phone], Fax: [phone]. Hours of Operation:  8:30 am - 5:30 pm, M-F.  Also accepts Medicaid and self-pay.  [facility] [address] [place] Phone: [phone]   [place], [place], [place] [phone], Ext. [phone] Mondays & Thursdays: 7-9 AM.  First 15 patients are seen on a first come, first serve basis.    [place] Providers:  Organization         Address  Phone   Notes  [facility] [address], [place] [phone] Also accepts self-pay patients.  [facility] [address], [place]  [phone]   [place], [address], [place] [phone]   [facility] [address], [place] [phone]   Lucianne Lei [address], [place]   [phone] Only accepts [place] Access [place] patients after they have their name applied to their card.   Self-Pay (no insurance) in [facility] [place]:  Organization         Address  Phone   Notes  Sickle Cell Patients, [facility] [place] [phone]   [facility] [place] [phone]   [facility] [place]  [place], [place], [place] [phone]   [facility]/Dr. Osei-Bonsu  [address] [place] or [address], [place] [phone] Phone number for both [place] and [place] locations is the same.  Urgent Medical and [facility] [address], [place] [phone]   [facility] [address], [place] or [address] [phone]   [facility] [address] [place] [phone], phone; [phone], fax Sees patients 1st and 3rd Saturday of every month.  Must not qualify for public or private insurance (i.e. Medicaid, Medicare, [place] Health Choice, Veterans' Benefits)  Household income should be no more than 200% of the poverty level The clinic cannot treat you if you are pregnant or think you are pregnant  Sexually transmitted diseases are not treated at the clinic.    Dental Care: Organization         Address  Phone  Notes  [facility] Department of [facility] [place] [phone] Accepts children up to  age 78 who are enrolled in [place] or [place]; pregnant women with a Medicaid card; and children who have applied for Medicaid or [place] Health Choice, but were declined, whose parents can pay a reduced fee at time of service.  [facility] Department of [facility]  [address], [place] [phone] Accepts children up to age 39 who are enrolled in [place] or [place]; pregnant women with a Medicaid card; and children who have applied for Medicaid or [place] Health Choice, but were declined, whose parents can pay a reduced fee at time of service.  [facility] Adult Dental Access PROGRAM  [place] [phone] Patients are seen by appointment only. Walk-ins are not accepted. [place] will see patients 78 years of age and older. Monday - Tuesday (8am-5pm) Most Wednesdays (8:30-5pm) $30 per  visit, cash only  [facility] Adult [employer] PROGRAM  [address], [facility] [phone] Patients are seen by appointment only. Walk-ins are not accepted. [place] will see patients 25 years of age and older. One Wednesday Evening (Monthly: Volunteer Based).  $30 per visit, cash only  [place]  [phone] for adults; Children under age 88, call Graduate Pediatric Dentistry at [phone]. Children aged 53-14, please call [phone] to request a pediatric application.  Dental services are provided in all areas of dental care including fillings, crowns and bridges, complete and partial dentures, implants, gum treatment, root canals, and extractions. Preventive care is also provided. Treatment is provided to both adults and children. Patients are selected via a lottery and there is often a waiting list.   [facility] [address] [place]  [phone] [email or web address]   [facility] [address] [place], [place] [phone], Ext. [phone] Second and Fourth Thursday of  each month, opens at 6:30 AM; Clinic ends at 9 AM.  Patients are seen on a first-come first-served basis, and a limited number are seen during each clinic.   [facility]  [address] Hillard Danker [place], [place] [phone]   Eligibility Requirements You must have lived in [place], [place], or [place] counties for at least the last three months.   You cannot be eligible for state or federal sponsored [employer], including [employer], [place], or [employer].   You generally cannot be eligible for healthcare insurance through your employer.    How to apply: Eligibility screenings are held every Tuesday and Wednesday afternoon from 1:00 pm until 4:00 pm. You do not need an appointment for the interview!  [facility] [address], [place], [place]   [place]  [place] Department  [place]  [phone]    Behavioral Health Resources in the Community: Intensive Outpatient Programs Organization         Address  Phone  Notes  [place]. [address] [place], [place] [phone]   [facility] [address] [place], [place]   ADS: Alcohol & Drug Svcs [address] [place], [place]   [place] [address],  [place], [place] or [phone]   Substance Abuse Resources Organization         Address  Phone  Notes  Alcohol and Drug Services  [phone]   [place]  [phone]   [facility] [place]   Chinita Pester  [phone]   Residential & Outpatient Substance Abuse Program  [phone]   Psychological Services Organization         Address  Phone  Notes  [facility]  [place]  [phone]   [place] [address],  [place] or [phone]    Mobile Crisis Teams Organization         Address  Phone  Notes  [facility], Mobile Crisis Care Unit  [phone]   [facility]  [address] [place], [place]   [place] [address], [place] [phone]    Self-Help/Support Groups Organization         Address  Phone  Notes  [facility]ental [facility]oc[facility] of [place] - variety of support groups  [place] Call for more information  Narcotics Anonymous (NA), [facility] [address], [employer] [place]  2 meetings at this location   Special educational needs teacher         Address  Phone  Notes  [facility]idential [facility] [place],    [place]  [phone]   [facility]  [address], [place] [phone], [place], [place]   [place], [place] [phone] Admissions: 8am-3pm M-F  Incentives Substance [place] [address],    [place], [place] [phone]   [facility] [address] [place], [place], [place]   [facility] [address]  [place], [place]   [facility] - Intensive Outpatient [address], [place], [place]   [facility] ([place].,  [place], [place] [phone] or [phone]   [facility] ([facility]) [address] [place], [place] Accepts Medicaid  [facility] [place] [address],  [place] [phone] Substance Abuse/Addiction Treatment   [facility] Organization         Address  Phone  Notes  [facility]  [phone]   Domenic Schwab, PhD [address] Arlis Porta [place], [place]   [phone] or [phone]   [place], [place] [phone]     [facility] [address], [place], [place] [phone] Insurance/Medicaid/sponsorship through [facility] and Families [address], [address]                                    [place], [place] [phone] [place] [address], [place] [phone]    Dr. Adele Schilder  [phone]   [facility] Dept. 1) [address], [place] 2) [place] 3)  [place] [address], [place] [phone]  [phone]   [place] [phone] or [phone] (After Hours)

## 2013-05-20 NOTE — ED Notes (Signed)
Called x's 3 without response 

## 2013-05-20 NOTE — ED Notes (Signed)
Pt reports intermittent upper mid abdominal pain x a few months, has been seen here previously for same complaint. Reports nausea but denies V/D. Denies blood in urine or stool. Denies chest pain or SOB.

## 2013-05-20 NOTE — ED Provider Notes (Signed)
Medical screening examination/treatment/procedure(s) were performed by non-physician practitioner and as supervising physician I was immediately available for consultation/collaboration.   EKG Interpretation None        Jonet Mathies B. Karle Starch, MD 05/20/13 1233

## 2013-05-20 NOTE — Discharge Planning (Signed)
P3AS Felicia E, Community Liaison  Spoke to patient about primary care resources and establishing care with a provider. Orange card application and instructions given. Resource guide and my contact information was also provided for any future questions or concerns.

## 2013-08-03 ENCOUNTER — Emergency Department (HOSPITAL_COMMUNITY)
Admission: EM | Admit: 2013-08-03 | Discharge: 2013-08-03 | Disposition: A | Discharge status: Home / Self Care | Payer: Self-pay | Attending: Emergency Medicine | Admitting: Emergency Medicine

## 2013-08-03 ENCOUNTER — Encounter (HOSPITAL_COMMUNITY): Payer: Self-pay | Admitting: Emergency Medicine

## 2013-08-03 DIAGNOSIS — F17200 Nicotine dependence, unspecified, uncomplicated: Secondary | ICD-10-CM | POA: Insufficient documentation

## 2013-08-03 DIAGNOSIS — Z79899 Other long term (current) drug therapy: Secondary | ICD-10-CM | POA: Insufficient documentation

## 2013-08-03 DIAGNOSIS — F319 Bipolar disorder, unspecified: Secondary | ICD-10-CM | POA: Insufficient documentation

## 2013-08-03 DIAGNOSIS — M25569 Pain in unspecified knee: Secondary | ICD-10-CM | POA: Insufficient documentation

## 2013-08-03 DIAGNOSIS — M25562 Pain in left knee: Secondary | ICD-10-CM

## 2013-08-03 DIAGNOSIS — Z8719 Personal history of other diseases of the digestive system: Secondary | ICD-10-CM | POA: Insufficient documentation

## 2013-08-03 DIAGNOSIS — Z862 Personal history of diseases of the blood and blood-forming organs and certain disorders involving the immune mechanism: Secondary | ICD-10-CM | POA: Insufficient documentation

## 2013-08-03 DIAGNOSIS — Z9889 Other specified postprocedural states: Secondary | ICD-10-CM | POA: Insufficient documentation

## 2013-08-03 DIAGNOSIS — M25561 Pain in right knee: Secondary | ICD-10-CM

## 2013-08-03 DIAGNOSIS — Z8709 Personal history of other diseases of the respiratory system: Secondary | ICD-10-CM | POA: Insufficient documentation

## 2013-08-03 DIAGNOSIS — Z880 Allergy status to penicillin: Secondary | ICD-10-CM | POA: Insufficient documentation

## 2013-08-03 DIAGNOSIS — G4730 Sleep apnea, unspecified: Secondary | ICD-10-CM | POA: Insufficient documentation

## 2013-08-03 DIAGNOSIS — Z87440 Personal history of urinary (tract) infections: Secondary | ICD-10-CM | POA: Insufficient documentation

## 2013-08-03 DIAGNOSIS — F411 Generalized anxiety disorder: Secondary | ICD-10-CM | POA: Insufficient documentation

## 2013-08-03 DIAGNOSIS — Z9981 Dependence on supplemental oxygen: Secondary | ICD-10-CM | POA: Insufficient documentation

## 2013-08-03 DIAGNOSIS — Z91040 Latex allergy status: Secondary | ICD-10-CM | POA: Insufficient documentation

## 2013-08-03 MED ORDER — METHOCARBAMOL 500 MG PO TABS: 500.0000 mg | ORAL_TABLET | Freq: Two times a day (BID) | ORAL | Status: DC

## 2013-08-03 MED ORDER — IBUPROFEN 200 MG PO TABS: 800.0000 mg | ORAL_TABLET | Freq: Four times a day (QID) | ORAL | Status: DC | PRN

## 2013-08-03 NOTE — ED Notes (Signed)
C.o bi-lateral knee pain for 3-4 weeks

## 2013-08-03 NOTE — ED Provider Notes (Signed)
CSN: [id number]     Arrival date & time 08/03/13  0604 History   First MD Initiated Contact with Patient 08/03/13 [phone]     Chief Complaint  Patient presents with  . Knee Pain     (Consider location/radiation/quality/duration/timing/severity/associated sxs/prior Treatment) HPI  43 year old female presents c/o bilateral knee pain.  Patient reports she injured her left knee approximately 3 years ago which required screws and plating, done by Dr. Marcelino Scot. Since then she has been having recurrent pain to her left knee that radiates throughout her leg. For the past several months she is having pain to both left knee and right knee. Pain is described as a sharp and aching sensation, intermittent, worse at night. Pain initially improved with taking ibuprofen but now ibuprofen has not helped. She reports some shooting pain throughout her left leg and right leg. She reports occasional swelling to her left knee. She denies any recent injury. She denies any fever, chills, rash. She denies urinary or bowel incontinence, saddle paresthesia, new numbness. Reports occasional low back pain but no significant back pain at this time. Aside from ibuprofen she has not tried anything else.    Past Medical History  Diagnosis Date  . Bronchitis   . Chronic bipolar disorder   . GERD (gastroesophageal reflux disease)   . Sleep apnea     CPAP  . Mental disorder     bipolar  . Anxiety   . Chronic kidney disease   . UTI (lower urinary tract infection) 10/2010  . Sickle cell trait   . Depression     bipolar  . Pyelonephritis 10/2010  . Shortness of breath     sometimes with exertion  . Recurrent upper respiratory infection (URI)     states she has not been to MD and feels like she has  bronchitis now- greenish sputum  . Blood transfusion     1989 at [facility]  . Headache(784.0)     occasional   Past Surgical History  Procedure Laterality Date  . Cesarean section    . Cesarean section    . Tubal ligation     . Orif tibia plateau  03/08/2011    Procedure: OPEN REDUCTION INTERNAL FIXATION (ORIF) TIBIAL PLATEAU;  Surgeon: Rozanna Box;  Location: [facility];  Service: Orthopedics;  Laterality: Left;  . Knee arthroscopy  06/15/2011    Procedure: ARTHROSCOPY KNEE;  Surgeon: Rozanna Box, MD;  Location: [facility];  Service: Orthopedics;  Laterality: Left;  LEFT KNEE SCOPE WITH LYSIS OF ADHESIONS AND MANIPULATION   Family History  Problem Relation Age of Onset  . Thyroid disease Father   . Diabetes Other   . Cancer Other   . Anesthesia problems Neg Hx    History  Substance Use Topics  . Smoking status: Current Every Day Smoker -- 0.50 packs/day for 24 years    Types: Cigarettes  . Smokeless tobacco: Never Used  . Alcohol Use: Yes     Comment: ocassion social   OB History   Grav Para Term Preterm Abortions TAB SAB Ect Mult Living                 Review of Systems  Constitutional: Negative for fever.  Musculoskeletal: Positive for joint swelling. Negative for back pain.  Skin: Negative for rash and wound.  Neurological: Negative for numbness.      Allergies  Aspirin; Banana; Latex; Penicillins; Septra; Shellfish allergy; Strawberry; and Tape  Home Medications   Prior  to Admission medications   Medication Sig Start Date End Date Taking? Authorizing Provider  carbamazepine (TEGRETOL XR) 400 MG 12 hr tablet Take 400 mg by mouth at bedtime.   Yes Historical Provider, MD  ibuprofen (ADVIL,MOTRIN) 200 MG tablet Take 800 mg by mouth every 6 (six) hours as needed for moderate pain.   Yes Historical Provider, MD   BP 141/88  Pulse 81  Temp(Src) 98.1 F (36.7 C) (Oral)  Resp 18  SpO2 99%  LMP 07/22/2013 Physical Exam  Nursing note and vitals reviewed. Constitutional: She appears well-developed and well-nourished. No distress.  HENT:  Head: Atraumatic.  Eyes: Conjunctivae are normal.  Neck: Neck supple.  Cardiovascular: Intact distal pulses.   Musculoskeletal: She exhibits tenderness  (mild tenderness to anterior patella region to bilateral knees without any overlying skin changes, deformity, or swelling noted. Left knee with decreasing flexion but normal extension. Negative anterior and posterior drawer testing bilaterally knee. ).  Bilateral lower extremities without palpable cords, erythema, edema, negative Homans sign.  Patient able to ambulate without difficulty.  Neurological: She is alert.  Skin: No rash noted.  Psychiatric: She has a normal mood and affect.    ED Course  Procedures (including critical care time)  7:13 AM Patient is here with recurrent progressive pain to bilateral knee. No symptoms is worrisome for septic arthritis, no radicular pain. No signs of infection. She is neurovascularly intact and able to cannulate without difficulty. I suspect her pain is from arthralgia secondary to prior L knee surgery.    Labs Review Labs Reviewed - No data to display  Imaging Review No results found.   EKG Interpretation None      MDM   Final diagnoses:  Bilateral anterior knee pain    BP 141/88  Pulse 81  Temp(Src) 98.1 F (36.7 C) (Oral)  Resp 18  SpO2 99%  LMP 07/22/2013     Domenic Moras, PA-C 08/03/13 8338

## 2013-08-03 NOTE — Discharge Instructions (Signed)
Arthralgia  Your caregiver has diagnosed you as suffering from an arthralgia. Arthralgia means there is pain in a joint. This can come from many reasons including:  · Bruising the joint which causes soreness (inflammation) in the joint.  · Wear and tear on the joints which occur as we grow older (osteoarthritis).  · Overusing the joint.  · Various forms of arthritis.  · Infections of the joint.  Regardless of the cause of pain in your joint, most of these different pains respond to anti-inflammatory drugs and rest. The exception to this is when a joint is infected, and these cases are treated with antibiotics, if it is a bacterial infection.  HOME CARE INSTRUCTIONS   · Rest the injured area for as long as directed by your caregiver. Then slowly start using the joint as directed by your caregiver and as the pain allows. Crutches as directed may be useful if the ankles, knees or hips are involved. If the knee was splinted or casted, continue use and care as directed. If an stretchy or elastic wrapping bandage has been applied today, it should be removed and re-applied every 3 to 4 hours. It should not be applied tightly, but firmly enough to keep swelling down. Watch toes and feet for swelling, bluish discoloration, coldness, numbness or excessive pain. If any of these problems (symptoms) occur, remove the ace bandage and re-apply more loosely. If these symptoms persist, contact your caregiver or return to this location.  · For the first 24 hours, keep the injured extremity elevated on pillows while lying down.  · Apply ice for 15-20 minutes to the sore joint every couple hours while awake for the first half day. Then 03-04 times per day for the first 48 hours. Put the ice in a plastic bag and place a towel between the bag of ice and your skin.  · Wear any splinting, casting, elastic bandage applications, or slings as instructed.  · Only take over-the-counter or prescription medicines for pain, discomfort, or fever as  directed by your caregiver. Do not use aspirin immediately after the injury unless instructed by your physician. Aspirin can cause increased bleeding and bruising of the tissues.  · If you were given crutches, continue to use them as instructed and do not resume weight bearing on the sore joint until instructed.  Persistent pain and inability to use the sore joint as directed for more than 2 to 3 days are warning signs indicating that you should see a caregiver for a follow-up visit as soon as possible. Initially, a hairline fracture (break in bone) may not be evident on X-rays. Persistent pain and swelling indicate that further evaluation, non-weight bearing or use of the joint (use of crutches or slings as instructed), or further X-rays are indicated. X-rays may sometimes not show a small fracture until a week or 10 days later. Make a follow-up appointment with your own caregiver or one to whom we have referred you. A radiologist (specialist in reading X-rays) may read your X-rays. Make sure you know how you are to obtain your X-ray results. Do not assume everything is normal if you do not hear from us.  SEEK MEDICAL CARE IF:  Bruising, swelling, or pain increases.  SEEK IMMEDIATE MEDICAL CARE IF:   · Your fingers or toes are numb or blue.  · The pain is not responding to medications and continues to stay the same or get worse.  · The pain in your joint becomes severe.  · You   develop a fever over 102° F (38.9° C).  · It becomes impossible to move or use the joint.  MAKE SURE YOU:   · Understand these instructions.  · Will watch your condition.  · Will get help right away if you are not doing well or get worse.  Document Released: 02/19/2005 Document Revised: 05/14/2011 Document Reviewed: 10/08/2007  ExitCare® Patient Information ©2014 [employer].

## 2013-08-10 NOTE — ED Provider Notes (Signed)
Medical screening examination/treatment/procedure(s) were performed by non-physician practitioner and as supervising physician I was immediately available for consultation/collaboration.   EKG Interpretation None       Ariauna Farabee K Britzy Graul-Rasch, MD 08/10/13 2311

## 2013-08-28 ENCOUNTER — Encounter (HOSPITAL_COMMUNITY): Payer: Self-pay | Admitting: Emergency Medicine

## 2013-08-28 ENCOUNTER — Emergency Department (HOSPITAL_COMMUNITY)
Admission: EM | Admit: 2013-08-28 | Discharge: 2013-08-28 | Disposition: A | Discharge status: Home / Self Care | Payer: Self-pay | Attending: Emergency Medicine | Admitting: Emergency Medicine

## 2013-08-28 DIAGNOSIS — Z880 Allergy status to penicillin: Secondary | ICD-10-CM | POA: Insufficient documentation

## 2013-08-28 DIAGNOSIS — Z79899 Other long term (current) drug therapy: Secondary | ICD-10-CM | POA: Insufficient documentation

## 2013-08-28 DIAGNOSIS — H109 Unspecified conjunctivitis: Secondary | ICD-10-CM | POA: Insufficient documentation

## 2013-08-28 DIAGNOSIS — Z91040 Latex allergy status: Secondary | ICD-10-CM | POA: Insufficient documentation

## 2013-08-28 DIAGNOSIS — Z8719 Personal history of other diseases of the digestive system: Secondary | ICD-10-CM | POA: Insufficient documentation

## 2013-08-28 DIAGNOSIS — G4730 Sleep apnea, unspecified: Secondary | ICD-10-CM | POA: Insufficient documentation

## 2013-08-28 DIAGNOSIS — F319 Bipolar disorder, unspecified: Secondary | ICD-10-CM | POA: Insufficient documentation

## 2013-08-28 DIAGNOSIS — N189 Chronic kidney disease, unspecified: Secondary | ICD-10-CM | POA: Insufficient documentation

## 2013-08-28 DIAGNOSIS — Z9981 Dependence on supplemental oxygen: Secondary | ICD-10-CM | POA: Insufficient documentation

## 2013-08-28 DIAGNOSIS — Z862 Personal history of diseases of the blood and blood-forming organs and certain disorders involving the immune mechanism: Secondary | ICD-10-CM | POA: Insufficient documentation

## 2013-08-28 DIAGNOSIS — Z8709 Personal history of other diseases of the respiratory system: Secondary | ICD-10-CM | POA: Insufficient documentation

## 2013-08-28 DIAGNOSIS — Z87440 Personal history of urinary (tract) infections: Secondary | ICD-10-CM | POA: Insufficient documentation

## 2013-08-28 DIAGNOSIS — Z8742 Personal history of other diseases of the female genital tract: Secondary | ICD-10-CM | POA: Insufficient documentation

## 2013-08-28 DIAGNOSIS — F17200 Nicotine dependence, unspecified, uncomplicated: Secondary | ICD-10-CM | POA: Insufficient documentation

## 2013-08-28 MED ORDER — TETRACAINE HCL 0.5 % OP SOLN
2.0000 [drp] | Freq: Once | OPHTHALMIC | Status: AC
  Administered 2013-08-28: 2 [drp] via OPHTHALMIC
  Filled 2013-08-28: qty 2

## 2013-08-28 MED ORDER — LEVOFLOXACIN 0.5 % OP SOLN: 1.0000 [drp] | OPHTHALMIC | Status: DC

## 2013-08-28 MED ORDER — HYDROCODONE-ACETAMINOPHEN 5-325 MG PO TABS: 1.0000 | ORAL_TABLET | ORAL | Status: DC | PRN

## 2013-08-28 MED ORDER — FLUORESCEIN SODIUM 1 MG OP STRP
1.0000 | ORAL_STRIP | Freq: Once | OPHTHALMIC | Status: AC
  Administered 2013-08-28: 1 via OPHTHALMIC
  Filled 2013-08-28: qty 1

## 2013-08-28 NOTE — ED Notes (Signed)
Declined W/C at D/C and was escorted to lobby by RN. 

## 2013-08-28 NOTE — ED Notes (Signed)
Pt given crackers and peanut butter.

## 2013-08-28 NOTE — ED Notes (Signed)
Pt. woke up with left eye redness , pain and  teary . No blurred vision .

## 2013-08-28 NOTE — ED Provider Notes (Signed)
CSN: [id number]     Arrival date & time 08/28/13  0240 History   First MD Initiated Contact with Patient 08/28/13 [id number]     Chief Complaint  Patient presents with  . Eye Injury     (Consider location/radiation/quality/duration/timing/severity/associated sxs/prior Treatment) HPI  Patient presents with left eye irritation. States that around 3:00 in the morning she woke up feeling as if there was something in her left eye. She has discomfort and pain whenever she links, describes it as feeling as if something is scratching her eye. Associated clear discharge. She washed her eye out and used eye drops without relief earlier today. Denies fevers, pain around her eye, difficulty moving her eye, change in vision.  Denies any sick contacts or contact with anybody who has had conjunctivitis. She does not work with metal or wood. Denies any trauma to her eye or any known foreign body to her eye.  Past Medical History  Diagnosis Date  . Bronchitis   . Chronic bipolar disorder   . GERD (gastroesophageal reflux disease)   . Sleep apnea     CPAP  . Mental disorder     bipolar  . Anxiety   . Chronic kidney disease   . UTI (lower urinary tract infection) 10/2010  . Sickle cell trait   . Depression     bipolar  . Pyelonephritis 10/2010  . Shortness of breath     sometimes with exertion  . Recurrent upper respiratory infection (URI)     states she has not been to MD and feels like she has  bronchitis now- greenish sputum  . Blood transfusion     1989 at [facility]  . Headache(784.0)     occasional   Past Surgical History  Procedure Laterality Date  . Cesarean section    . Cesarean section    . Tubal ligation    . Orif tibia plateau  03/08/2011    Procedure: OPEN REDUCTION INTERNAL FIXATION (ORIF) TIBIAL PLATEAU;  Surgeon: Rozanna Box;  Location: [facility];  Service: Orthopedics;  Laterality: Left;  . Knee arthroscopy  06/15/2011    Procedure: ARTHROSCOPY KNEE;  Surgeon: Rozanna Box,  MD;  Location: [facility];  Service: Orthopedics;  Laterality: Left;  LEFT KNEE SCOPE WITH LYSIS OF ADHESIONS AND MANIPULATION   Family History  Problem Relation Age of Onset  . Thyroid disease Father   . Diabetes Other   . Cancer Other   . Anesthesia problems Neg Hx    History  Substance Use Topics  . Smoking status: Current Every Day Smoker -- 0.50 packs/day for 24 years    Types: Cigarettes  . Smokeless tobacco: Never Used  . Alcohol Use: Yes     Comment: ocassion social   OB History   Grav Para Term Preterm Abortions TAB SAB Ect Mult Living                 Review of Systems  All other systems reviewed and are negative.     Allergies  Aspirin; Banana; Latex; Penicillins; Septra; Shellfish allergy; Strawberry; and Tape  Home Medications   Prior to Admission medications   Medication Sig Start Date End Date Taking? Authorizing Provider  carbamazepine (TEGRETOL XR) 400 MG 12 hr tablet Take 400 mg by mouth at bedtime.    Historical Provider, MD  ibuprofen (ADVIL,MOTRIN) 200 MG tablet Take 4 tablets (800 mg total) by mouth every 6 (six) hours as needed for moderate pain. 08/03/13   Domenic Moras,  PA-C  methocarbamol (ROBAXIN) 500 MG tablet Take 1 tablet (500 mg total) by mouth 2 (two) times daily. 08/03/13   Domenic Moras, PA-C   BP 147/91  Pulse 85  Temp(Src) 98.1 F (36.7 C) (Oral)  Resp 14  Ht 5\' 7"  (1.702 m)  Wt 232 lb (105.235 kg)  BMI 36.33 kg/m2  SpO2 100%  LMP 07/23/2013 Physical Exam  Nursing note and vitals reviewed. Constitutional: She appears well-developed and well-nourished. No distress.  HENT:  Head: Normocephalic and atraumatic.  Eyes: EOM are normal. Pupils are equal, round, and reactive to light. Lids are everted and swept, no foreign bodies found. Right eye exhibits no discharge. Left eye exhibits no discharge. Left conjunctiva is injected. Left conjunctiva has no hemorrhage. No scleral icterus.  Slit lamp exam:      The left eye shows no corneal abrasion, no  corneal ulcer and no foreign body.  Mild edema of the left lid without erythema, warmth, tenderness.  Neck: Neck supple.  Pulmonary/Chest: Effort normal.  Neurological: She is alert.  Skin: She is not diaphoretic.    ED Course  Procedures (including critical care time) Labs Review Labs Reviewed - No data to display  Imaging Review No results found.   EKG Interpretation None      MDM   Final diagnoses:  Conjunctivitis of left eye    Pt with irritation and injection of left eye without e/o FB, or corneal abrasion.  No e/o cellulitis.  D/C home with antibiotic drops, pain medication, ophthalmology referral for no improvement/worsening symptoms.  Discussed findings, treatment, and follow up  with patient.  Pt given return precautions.  Pt verbalizes understanding and agrees with plan.        Clayton Bibles, PA-C 08/28/13 0908  Medical screening examination/treatment/procedure(s) were performed by non-physician practitioner and as supervising physician I was immediately available for consultation/collaboration.   EKG Interpretation None       Nat Christen, MD 09/06/13 2027

## 2013-08-28 NOTE — Discharge Instructions (Signed)
Read the information below.  You may return to the Emergency Department at any time for worsening condition or any new symptoms that concern you.  If you develop worsening eye pain, change in your vision, fevers, swelling or pain around your eye, or difficulty moving your eye, return to the ER for a recheck.     Conjunctivitis Conjunctivitis is commonly called "pink eye." Conjunctivitis can be caused by bacterial or viral infection, allergies, or injuries. There is usually redness of the lining of the eye, itching, discomfort, and sometimes discharge. There may be deposits of matter along the eyelids. A viral infection usually causes a watery discharge, while a bacterial infection causes a yellowish, thick discharge. Pink eye is very contagious and spreads by direct contact. You may be given antibiotic eyedrops as part of your treatment. Before using your eye medicine, remove all drainage from the eye by washing gently with warm water and cotton balls. Continue to use the medication until you have awakened 2 mornings in a row without discharge from the eye. Do not rub your eye. This increases the irritation and helps spread infection. Use separate towels from other household members. Wash your hands with soap and water before and after touching your eyes. Use cold compresses to reduce pain and sunglasses to relieve irritation from light. Do not wear contact lenses or wear eye makeup until the infection is gone. SEEK MEDICAL CARE IF:   Your symptoms are not better after 3 days of treatment.  You have increased pain or trouble seeing.  The outer eyelids become very red or swollen. Document Released: 03/29/2004 Document Revised: 05/14/2011 Document Reviewed: 02/19/2005 [facility] Patient Information 2015 [place], [place]. This information is not intended to replace advice given to you by your health care provider. Make sure you discuss any questions you have with your health care provider.

## 2013-09-26 ENCOUNTER — Emergency Department (HOSPITAL_COMMUNITY)
Admission: EM | Admit: 2013-09-26 | Discharge: 2013-09-26 | Disposition: A | Discharge status: Home / Self Care | Payer: Self-pay | Attending: Emergency Medicine | Admitting: Emergency Medicine

## 2013-09-26 ENCOUNTER — Encounter (HOSPITAL_COMMUNITY): Payer: Self-pay | Admitting: Emergency Medicine

## 2013-09-26 DIAGNOSIS — Z9981 Dependence on supplemental oxygen: Secondary | ICD-10-CM | POA: Insufficient documentation

## 2013-09-26 DIAGNOSIS — Z79899 Other long term (current) drug therapy: Secondary | ICD-10-CM | POA: Insufficient documentation

## 2013-09-26 DIAGNOSIS — H9319 Tinnitus, unspecified ear: Secondary | ICD-10-CM | POA: Insufficient documentation

## 2013-09-26 DIAGNOSIS — Z8719 Personal history of other diseases of the digestive system: Secondary | ICD-10-CM | POA: Insufficient documentation

## 2013-09-26 DIAGNOSIS — F17200 Nicotine dependence, unspecified, uncomplicated: Secondary | ICD-10-CM | POA: Insufficient documentation

## 2013-09-26 DIAGNOSIS — Z8709 Personal history of other diseases of the respiratory system: Secondary | ICD-10-CM | POA: Insufficient documentation

## 2013-09-26 DIAGNOSIS — H6640 Suppurative otitis media, unspecified, unspecified ear: Secondary | ICD-10-CM | POA: Insufficient documentation

## 2013-09-26 DIAGNOSIS — H6642 Suppurative otitis media, unspecified, left ear: Secondary | ICD-10-CM

## 2013-09-26 DIAGNOSIS — N189 Chronic kidney disease, unspecified: Secondary | ICD-10-CM | POA: Insufficient documentation

## 2013-09-26 DIAGNOSIS — Z87440 Personal history of urinary (tract) infections: Secondary | ICD-10-CM | POA: Insufficient documentation

## 2013-09-26 DIAGNOSIS — Z862 Personal history of diseases of the blood and blood-forming organs and certain disorders involving the immune mechanism: Secondary | ICD-10-CM | POA: Insufficient documentation

## 2013-09-26 DIAGNOSIS — Z91040 Latex allergy status: Secondary | ICD-10-CM | POA: Insufficient documentation

## 2013-09-26 DIAGNOSIS — H9210 Otorrhea, unspecified ear: Secondary | ICD-10-CM | POA: Insufficient documentation

## 2013-09-26 DIAGNOSIS — F319 Bipolar disorder, unspecified: Secondary | ICD-10-CM | POA: Insufficient documentation

## 2013-09-26 DIAGNOSIS — H8110 Benign paroxysmal vertigo, unspecified ear: Secondary | ICD-10-CM | POA: Insufficient documentation

## 2013-09-26 DIAGNOSIS — Z880 Allergy status to penicillin: Secondary | ICD-10-CM | POA: Insufficient documentation

## 2013-09-26 DIAGNOSIS — G4730 Sleep apnea, unspecified: Secondary | ICD-10-CM | POA: Insufficient documentation

## 2013-09-26 DIAGNOSIS — R42 Dizziness and giddiness: Secondary | ICD-10-CM | POA: Insufficient documentation

## 2013-09-26 DIAGNOSIS — F411 Generalized anxiety disorder: Secondary | ICD-10-CM | POA: Insufficient documentation

## 2013-09-26 MED ORDER — AZITHROMYCIN 250 MG PO TABS: 250.0000 mg | ORAL_TABLET | Freq: Every day | ORAL | Status: DC

## 2013-09-26 MED ORDER — MECLIZINE HCL 25 MG PO TABS
25.0000 mg | ORAL_TABLET | Freq: Once | ORAL | Status: AC
  Administered 2013-09-26: 25 mg via ORAL
  Filled 2013-09-26: qty 1

## 2013-09-26 MED ORDER — MECLIZINE HCL 50 MG PO TABS: 50.0000 mg | ORAL_TABLET | Freq: Three times a day (TID) | ORAL | Status: DC | PRN

## 2013-09-26 MED ORDER — ONDANSETRON HCL 4 MG PO TABS: 4.0000 mg | ORAL_TABLET | Freq: Three times a day (TID) | ORAL | Status: DC | PRN

## 2013-09-26 MED ORDER — ONDANSETRON 4 MG PO TBDP
4.0000 mg | ORAL_TABLET | Freq: Once | ORAL | Status: AC
  Administered 2013-09-26: 4 mg via ORAL
  Filled 2013-09-26: qty 1

## 2013-09-26 NOTE — ED Notes (Signed)
Pt. Stated, I've been having ringing in my ears for 4 days and causes me to be dizzy.

## 2013-09-26 NOTE — Discharge Instructions (Signed)
Benign Positional Vertigo Vertigo means you feel like you or your surroundings are moving when they are not. Benign positional vertigo is the most common form of vertigo. Benign means that the cause of your condition is not serious. Benign positional vertigo is more common in older adults. CAUSES  Benign positional vertigo is the result of an upset in the labyrinth system. This is an area in the middle ear that helps control your balance. This may be caused by a viral infection, head injury, or repetitive motion. However, often no specific cause is found. SYMPTOMS  Symptoms of benign positional vertigo occur when you move your head or eyes in different directions. Some of the symptoms may include:  Loss of balance and falls.  Vomiting.  Blurred vision.  Dizziness.  Nausea.  Involuntary eye movements (nystagmus). DIAGNOSIS  Benign positional vertigo is usually diagnosed by physical exam. If the specific cause of your benign positional vertigo is unknown, your caregiver may perform imaging tests, such as magnetic resonance imaging (MRI) or computed tomography (CT). TREATMENT  Your caregiver may recommend movements or procedures to correct the benign positional vertigo. Medicines such as meclizine, benzodiazepines, and medicines for nausea may be used to treat your symptoms. In rare cases, if your symptoms are caused by certain conditions that affect the inner ear, you may need surgery. HOME CARE INSTRUCTIONS   Follow your caregiver's instructions.  Move slowly. Do not make sudden body or head movements.  Avoid driving.  Avoid operating heavy machinery.  Avoid performing any tasks that would be dangerous to you or others during a vertigo episode.  Drink enough fluids to keep your urine clear or pale yellow. SEEK IMMEDIATE MEDICAL CARE IF:   You develop problems with walking, weakness, numbness, or using your arms, hands, or legs.  You have difficulty speaking.  You develop  severe headaches.  Your nausea or vomiting continues or gets worse.  You develop visual changes.  Your family or friends notice any behavioral changes.  Your condition gets worse.  You have a fever.  You develop a stiff neck or sensitivity to light. MAKE SURE YOU:   Understand these instructions.  Will watch your condition.  Will get help right away if you are not doing well or get worse. Document Released: 11/27/2005 Document Revised: 05/14/2011 Document Reviewed: 11/09/2010 [facility] Patient Information 2015 [place], [place]. This information is not intended to replace advice given to you by your health care provider. Make sure you discuss any questions you have with your health care provider.  Otitis Media Otitis media is redness, soreness, and inflammation of the middle ear. Otitis media may be caused by allergies or, most commonly, by infection. Often it occurs as a complication of the common cold. SIGNS AND SYMPTOMS Symptoms of otitis media may include:  Earache.  Fever.  Ringing in your ear.  Headache.  Leakage of fluid from the ear. DIAGNOSIS To diagnose otitis media, your health care provider will examine your ear with an otoscope. This is an instrument that allows your health care provider to see into your ear in order to examine your eardrum. Your health care provider also will ask you questions about your symptoms. TREATMENT  Typically, otitis media resolves on its own within 3-5 days. Your health care provider may prescribe medicine to ease your symptoms of pain. If otitis media does not resolve within 5 days or is recurrent, your health care provider may prescribe antibiotic medicines if he or she suspects that a bacterial infection is  the cause. HOME CARE INSTRUCTIONS   If you were prescribed an antibiotic medicine, finish it all even if you start to feel better.  Take medicines only as directed by your health care provider.  Keep all follow-up visits as  directed by your health care provider. SEEK MEDICAL CARE IF:  You have otitis media only in one ear, or bleeding from your nose, or both.  You notice a lump on your neck.  You are not getting better in 3-5 days.  You feel worse instead of better. SEEK IMMEDIATE MEDICAL CARE IF:   You have pain that is not controlled with medicine.  You have swelling, redness, or pain around your ear or stiffness in your neck.  You notice that part of your face is paralyzed.  You notice that the bone behind your ear (mastoid) is tender when you touch it. MAKE SURE YOU:   Understand these instructions.  Will watch your condition.  Will get help right away if you are not doing well or get worse. Document Released: 11/25/2003 Document Revised: 07/06/2013 Document Reviewed: 09/16/2012 [facility] [facility] Patient Information 2015 [place], [place]. This information is not intended to replace advice given to you by your health care provider. Make sure you discuss any questions you have with your health care provider.

## 2013-09-26 NOTE — ED Provider Notes (Signed)
Medical screening examination/treatment/procedure(s) were performed by non-physician practitioner and as supervising physician I was immediately available for consultation/collaboration.   EKG Interpretation None        Sharyon Cable, MD 09/26/13 1546

## 2013-09-26 NOTE — ED Provider Notes (Signed)
CSN: [id number]     Arrival date & time 09/26/13  8270 History   First MD Initiated Contact with Patient 09/26/13 0801     Chief Complaint  Patient presents with  . Tinnitus  . Dizziness     (Consider location/radiation/quality/duration/timing/severity/associated sxs/prior Treatment) HPI  Patient to the ER for evaluation of dizziness, tinnitus and left ear pain. It has been intermittent over the past month. She reports that sometimes she has a ringing in her ear; it is worse on the left than the right. Then she is also noticing that sometimes when she goes from laying, to sitting, to standing she gets dizzy for a few seconds then it resolves. She also notices she has the dizziness when she has the tinnitus. She denies having had any falls or headaches. Her pain is localized to the left ear, she has not noticed any discharge. She has had no headaches, weakness (generalized or focal), fevers, neck pain, confusion, change of vision, CP, SOB, lower extremity swelling, or sore throat.   Past Medical History  Diagnosis Date  . Bronchitis   . Chronic bipolar disorder   . GERD (gastroesophageal reflux disease)   . Sleep apnea     CPAP  . Mental disorder     bipolar  . Anxiety   . Chronic kidney disease   . UTI (lower urinary tract infection) 10/2010  . Sickle cell trait   . Depression     bipolar  . Pyelonephritis 10/2010  . Shortness of breath     sometimes with exertion  . Recurrent upper respiratory infection (URI)     states she has not been to MD and feels like she has  bronchitis now- greenish sputum  . Blood transfusion     1989 at   . Headache(784.0)     occasional   Past Surgical History  Procedure Laterality Date  . Cesarean section    . Cesarean section    . Tubal ligation    . Orif tibia plateau  03/08/2011    Procedure: OPEN REDUCTION INTERNAL FIXATION (ORIF) TIBIAL PLATEAU;  Surgeon: Rozanna Box;  Location: [facility];  Service: Orthopedics;  Laterality:  Left;  . Knee arthroscopy  06/15/2011    Procedure: ARTHROSCOPY KNEE;  Surgeon: Rozanna Box, MD;  Location: [place];  Service: Orthopedics;  Laterality: Left;  LEFT KNEE SCOPE WITH LYSIS OF ADHESIONS AND MANIPULATION   Family History  Problem Relation Age of Onset  . Thyroid disease Father   . Diabetes Other   . Cancer Other   . Anesthesia problems Neg Hx    History  Substance Use Topics  . Smoking status: Current Every Day Smoker -- 0.50 packs/day for 24 years    Types: Cigarettes  . Smokeless tobacco: Never Used  . Alcohol Use: Yes     Comment: ocassion social   OB History   Grav Para Term Preterm Abortions TAB SAB Ect Mult Living                 Review of Systems   Review of Systems  Gen: no weight loss, fevers, chills, night sweats  Eyes: no discharge or drainage, no occular pain or visual changes  Ear:+ tinnitus and ear pain Nose: no epistaxis or rhinorrhea  Mouth: no dental pain, no sore throat  Neck: no neck pain  Lungs:No wheezing, coughing or hemoptysis CV: no chest pain, palpitations, dependent edema or orthopnea  Abd: no abdominal pain, nausea, vomiting, diarrhea GU: no  dysuria or gross hematuria  MSK:  No muscle weakness or pain Neuro: no headache, no focal neurologic deficits + dizziness Skin: no rash or wounds Psyche: no complaints    Allergies  Aspirin; Banana; Latex; Penicillins; Septra; Shellfish allergy; Strawberry; and Tape  Home Medications   Prior to Admission medications   Medication Sig Start Date End Date Taking? Authorizing Provider  carbamazepine (TEGRETOL) 200 MG tablet Take 200 mg by mouth daily.   Yes Historical Provider, MD  prazosin (MINIPRESS) 1 MG capsule Take 1 mg by mouth at bedtime.   Yes Historical Provider, MD  traZODone (DESYREL) 150 MG tablet Take 150 mg by mouth at bedtime.   Yes Historical Provider, MD  azithromycin (ZITHROMAX) 250 MG tablet Take 1 tablet (250 mg total) by mouth daily. Take first 2 tablets together,  then 1 every day until finished. 09/26/13   Ulrich Soules Marilu Favre, PA-C  meclizine (ANTIVERT) 50 MG tablet Take 1 tablet (50 mg total) by mouth 3 (three) times daily as needed. 09/26/13   Darenda Fike Marilu Favre, PA-C  ondansetron (ZOFRAN) 4 MG tablet Take 1 tablet (4 mg total) by mouth every 8 (eight) hours as needed for nausea or vomiting. 09/26/13   Linus Mako, PA-C   BP 125/72  Pulse 73  Temp(Src) 98.4 F (36.9 C)  Resp 20  SpO2 97%  LMP 09/20/2013 Physical Exam  Nursing note and vitals reviewed. Constitutional: She is oriented to person, place, and time. She appears well-developed and well-nourished. No distress.  HENT:  Head: Normocephalic and atraumatic.  Right Ear: Tympanic membrane and ear canal normal.  Left Ear: Ear canal normal. There is drainage, swelling and tenderness. Tympanic membrane is injected.  Eyes: Conjunctivae, EOM and lids are normal. Pupils are equal, round, and reactive to light.  Neck: Normal range of motion. Neck supple. No Brudzinski's sign and no Kernig's sign noted.  Cardiovascular: Normal rate and regular rhythm.   Pulmonary/Chest: Effort normal.  Abdominal: Soft.  Neurological: She is alert and oriented to person, place, and time. She has normal strength. No cranial nerve deficit or sensory deficit. She displays a negative Romberg sign. GCS eye subscore is 4. GCS verbal subscore is 5. GCS motor subscore is 6.  Dix-Hallpike test is positve.   Skin: Skin is warm and dry.    ED Course  Procedures (including critical care time) Labs Review Labs Reviewed - No data to display  Imaging Review No results found.   EKG Interpretation None      MDM   Final diagnoses:  Benign paroxysmal vertigo, unspecified laterality  Suppurative otitis media of left ear without spontaneous rupture of tympanic membrane, recurrence not specified, unspecified chronicity    The patients symptoms are consistent with vertigo and she has an ear infection as well. No weakness or  dizziness and her symptoms  revolve around movement but very quickly resolves. No symptoms at rest. She was given Antivert 25 mg an Zofran in the ED and reported feeling significantly better. Reports she can now sit up and stand up without feeling dizziness. Her tinnitus was not present during this visit. She has been given a referral to [facility] for follow-up. Pt able to ambulate with no difficulty. Denies having headache.  43 y.o.Penni Homans Plair's evaluation in the Emergency Department is complete. It has been determined that no acute conditions requiring further emergency intervention are present at this time. The patient/guardian have been advised of the diagnosis and plan. We have discussed signs and symptoms that  warrant return to the ED, such as changes or worsening in symptoms.  Vital signs are stable at discharge. Filed Vitals:   09/26/13 1030  BP: 125/72  Pulse: 73  Temp:   Resp: 20    Patient/guardian has voiced understanding and agreed to follow-up with the PCP or specialist.     Linus Mako, PA-C 09/26/13 1442

## 2013-10-07 ENCOUNTER — Ambulatory Visit: Payer: Self-pay | Admitting: Internal Medicine

## 2013-10-28 ENCOUNTER — Ambulatory Visit: Payer: Self-pay | Attending: Internal Medicine

## 2014-03-12 ENCOUNTER — Ambulatory Visit: Payer: Self-pay

## 2014-11-02 ENCOUNTER — Ambulatory Visit: Payer: Self-pay

## 2014-12-06 ENCOUNTER — Encounter (HOSPITAL_COMMUNITY): Payer: Self-pay | Admitting: Emergency Medicine

## 2014-12-06 ENCOUNTER — Emergency Department (HOSPITAL_COMMUNITY)
Admission: EM | Admit: 2014-12-06 | Discharge: 2014-12-06 | Disposition: A | Discharge status: Home / Self Care | Payer: Self-pay | Attending: Emergency Medicine | Admitting: Emergency Medicine

## 2014-12-06 DIAGNOSIS — Z720 Tobacco use: Secondary | ICD-10-CM | POA: Insufficient documentation

## 2014-12-06 DIAGNOSIS — Z8709 Personal history of other diseases of the respiratory system: Secondary | ICD-10-CM | POA: Insufficient documentation

## 2014-12-06 DIAGNOSIS — N189 Chronic kidney disease, unspecified: Secondary | ICD-10-CM | POA: Insufficient documentation

## 2014-12-06 DIAGNOSIS — Z87440 Personal history of urinary (tract) infections: Secondary | ICD-10-CM | POA: Insufficient documentation

## 2014-12-06 DIAGNOSIS — Z79899 Other long term (current) drug therapy: Secondary | ICD-10-CM | POA: Insufficient documentation

## 2014-12-06 DIAGNOSIS — Z91040 Latex allergy status: Secondary | ICD-10-CM | POA: Insufficient documentation

## 2014-12-06 DIAGNOSIS — F419 Anxiety disorder, unspecified: Secondary | ICD-10-CM | POA: Insufficient documentation

## 2014-12-06 DIAGNOSIS — Z87442 Personal history of urinary calculi: Secondary | ICD-10-CM | POA: Insufficient documentation

## 2014-12-06 DIAGNOSIS — R112 Nausea with vomiting, unspecified: Secondary | ICD-10-CM | POA: Insufficient documentation

## 2014-12-06 DIAGNOSIS — F319 Bipolar disorder, unspecified: Secondary | ICD-10-CM | POA: Insufficient documentation

## 2014-12-06 DIAGNOSIS — G4730 Sleep apnea, unspecified: Secondary | ICD-10-CM | POA: Insufficient documentation

## 2014-12-06 DIAGNOSIS — Z880 Allergy status to penicillin: Secondary | ICD-10-CM | POA: Insufficient documentation

## 2014-12-06 DIAGNOSIS — Z862 Personal history of diseases of the blood and blood-forming organs and certain disorders involving the immune mechanism: Secondary | ICD-10-CM | POA: Insufficient documentation

## 2014-12-06 LAB — COMPREHENSIVE METABOLIC PANEL
ALT: 38 U/L (ref 14–54)
AST: 72 U/L — ABNORMAL HIGH (ref 15–41)
Albumin: 4 g/dL (ref 3.5–5.0)
Alkaline Phosphatase: 110 U/L (ref 38–126)
Anion gap: 9 (ref 5–15)
BUN: 7 mg/dL (ref 6–20)
CO2: 23 mmol/L (ref 22–32)
Calcium: 8.9 mg/dL (ref 8.9–10.3)
Chloride: 104 mmol/L (ref 101–111)
Creatinine, Ser: 0.78 mg/dL (ref 0.44–1.00)
GFR calc Af Amer: >60 mL/min (ref >60)
GFR calc non Af Amer: >60 mL/min (ref >60)
Glucose, Bld: 109 mg/dL — ABNORMAL HIGH (ref 65–99)
Potassium: 4 mmol/L (ref 3.5–5.1)
Sodium: 136 mmol/L (ref 135–145)
Total Bilirubin: 0.8 mg/dL (ref 0.3–1.2)
Total Protein: 7.5 g/dL (ref 6.5–8.1)

## 2014-12-06 LAB — CBC WITH DIFFERENTIAL/PLATELET
Basophils Absolute: 0.1 10*3/uL (ref 0.0–0.1)
Basophils Relative: 1 %
Eosinophils Absolute: 0.1 10*3/uL (ref 0.0–0.7)
Eosinophils Relative: 1 %
HCT: 40.7 % (ref 36.0–46.0)
Hemoglobin: 13.7 g/dL (ref 12.0–15.0)
Lymphocytes Relative: 27 %
Lymphs Abs: 2.5 10*3/uL (ref 0.7–4.0)
MCH: 29.7 pg (ref 26.0–34.0)
MCHC: 33.7 g/dL (ref 30.0–36.0)
MCV: 88.1 fL (ref 78.0–100.0)
Monocytes Absolute: 0.6 10*3/uL (ref 0.1–1.0)
Monocytes Relative: 7 %
Neutro Abs: 6 10*3/uL (ref 1.7–7.7)
Neutrophils Relative %: 64 %
Platelets: 325 10*3/uL (ref 150–400)
RBC: 4.62 MIL/uL (ref 3.87–5.11)
RDW: 15.1 % (ref 11.5–15.5)
WBC: 9.3 10*3/uL (ref 4.0–10.5)

## 2014-12-06 LAB — LIPASE, BLOOD: Lipase: 13 U/L — ABNORMAL LOW (ref 22–51)

## 2014-12-06 MED ORDER — SODIUM CHLORIDE 0.9 % IV BOLUS (SEPSIS)
1000.0000 mL | Freq: Once | INTRAVENOUS | Status: AC
  Administered 2014-12-06: 1000 mL via INTRAVENOUS

## 2014-12-06 MED ORDER — ONDANSETRON HCL 4 MG/2ML IJ SOLN
4.0000 mg | Freq: Once | INTRAMUSCULAR | Status: AC
  Administered 2014-12-06: 4 mg via INTRAVENOUS
  Filled 2014-12-06: qty 2

## 2014-12-06 MED ORDER — OMEPRAZOLE 20 MG PO CPDR: 20.0000 mg | DELAYED_RELEASE_CAPSULE | Freq: Every day | ORAL | Status: DC

## 2014-12-06 MED ORDER — GI COCKTAIL ~~LOC~~
30.0000 mL | Freq: Once | ORAL | Status: AC
  Administered 2014-12-06: 30 mL via ORAL
  Filled 2014-12-06: qty 30

## 2014-12-06 MED ORDER — SUCRALFATE 1 GM/10ML PO SUSP
1.0000 g | Freq: Four times a day (QID) | ORAL | Status: DC
Start: 2014-12-06 — End: 2017-03-08

## 2014-12-06 NOTE — ED Provider Notes (Signed)
CSN: [id number]     Arrival date & time 12/06/14  0712 History   First MD Initiated Contact with Patient 12/06/14 [phone]     Chief Complaint  Patient presents with  . Nausea  . Emesis     (Consider location/radiation/quality/duration/timing/severity/associated sxs/prior Treatment) HPI Patient presents with concern of nausea, vomiting. Symptoms mild until 2 days ago, since that time have been persistent. No recent medication changes, diet changes, activity changes. Over the past 2 days patient has had persistent symptoms, with no relief from anything, but also without taking any medication. Patient notes that beyond baseline nausea, she has vomiting immediately after trying to drink anything. No attempts to eat solid food. No concurrent fever, lightheadedness, syncope, abdominal pain.  Patient smokes, drinks.   Smoking cessation provided, particularly in light of this patient's evaluation in the ED.  Past Medical History  Diagnosis Date  . Bronchitis   . Chronic bipolar disorder ([place])   . GERD (gastroesophageal reflux disease)   . Sleep apnea     CPAP  . Mental disorder     bipolar  . Anxiety   . Chronic kidney disease   . UTI (lower urinary tract infection) 10/2010  . Sickle cell trait ([place])   . Depression     bipolar  . Pyelonephritis 10/2010  . Shortness of breath     sometimes with exertion  . Recurrent upper respiratory infection (URI)     states she has not been to MD and feels like she has  bronchitis now- greenish sputum  . Blood transfusion     1989 at [place]  . Headache(784.0)     occasional   Past Surgical History  Procedure Laterality Date  . Cesarean section    . Cesarean section    . Tubal ligation    . Orif tibia plateau  03/08/2011    Procedure: OPEN REDUCTION INTERNAL FIXATION (ORIF) TIBIAL PLATEAU;  Surgeon: Rozanna Box;  Location: [facility];  Service: Orthopedics;  Laterality: Left;  . Knee arthroscopy  06/15/2011    Procedure: ARTHROSCOPY  KNEE;  Surgeon: Rozanna Box, MD;  Location: [facility];  Service: Orthopedics;  Laterality: Left;  LEFT KNEE SCOPE WITH LYSIS OF ADHESIONS AND MANIPULATION   Family History  Problem Relation Age of Onset  . Thyroid disease Father   . Diabetes Other   . Cancer Other   . Anesthesia problems Neg Hx    Social History  Substance Use Topics  . Smoking status: Current Every Day Smoker -- 0.50 packs/day for 24 years    Types: Cigarettes  . Smokeless tobacco: Never Used  . Alcohol Use: Yes     Comment: ocassion social   OB History    No data available     Review of Systems  Constitutional:       Per HPI, otherwise negative  HENT:       Per HPI, otherwise negative  Respiratory:       Per HPI, otherwise negative  Cardiovascular:       Per HPI, otherwise negative  Gastrointestinal: Positive for nausea and vomiting.  Endocrine:       Negative aside from HPI  Genitourinary:       Neg aside from HPI   Musculoskeletal:       Per HPI, otherwise negative  Skin: Negative.   Neurological: Negative for syncope.      Allergies  Aspirin; Banana; Latex; Penicillins; Septra; Shellfish allergy; Strawberry; and Tape  Home Medications  Prior to Admission medications   Medication Sig Start Date End Date Taking? Authorizing Provider  azithromycin (ZITHROMAX) 250 MG tablet Take 1 tablet (250 mg total) by mouth daily. Take first 2 tablets together, then 1 every day until finished. 09/26/13   Tiffany Carlota Raspberry, PA-C  carbamazepine (TEGRETOL) 200 MG tablet Take 200 mg by mouth daily.    Historical Provider, MD  meclizine (ANTIVERT) 50 MG tablet Take 1 tablet (50 mg total) by mouth 3 (three) times daily as needed. 09/26/13   Tiffany Carlota Raspberry, PA-C  ondansetron (ZOFRAN) 4 MG tablet Take 1 tablet (4 mg total) by mouth every 8 (eight) hours as needed for nausea or vomiting. 09/26/13   Delos Haring, PA-C  prazosin (MINIPRESS) 1 MG capsule Take 1 mg by mouth at bedtime.    Historical Provider, MD  traZODone  (DESYREL) 150 MG tablet Take 150 mg by mouth at bedtime.    Historical Provider, MD   BP 160/82 mmHg  Pulse 87  Temp(Src) 98.8 F (37.1 C) (Oral)  Resp 17  Ht 5\' 7"  (1.702 m)  Wt 229 lb (103.874 kg)  BMI 35.86 kg/m2  SpO2 100%  LMP 12/06/2014 Physical Exam  Constitutional: She is oriented to person, place, and time. She appears well-developed and well-nourished. No distress.  HENT:  Head: Normocephalic and atraumatic.  Eyes: Conjunctivae and EOM are normal.  Cardiovascular: Normal rate and regular rhythm.   Pulmonary/Chest: Effort normal and breath sounds normal. No stridor. No respiratory distress.  Abdominal: She exhibits no distension. There is no tenderness.  Musculoskeletal: She exhibits no edema.  Neurological: She is alert and oriented to person, place, and time. No cranial nerve deficit.  Skin: Skin is warm and dry.  Psychiatric: She has a normal mood and affect.  Nursing note and vitals reviewed.   ED Course  Procedures (including critical care time) Labs Review Labs Reviewed  COMPREHENSIVE METABOLIC PANEL - Abnormal; Notable for the following:    Glucose, Bld 109 (*)    AST 72 (*)    All other components within normal limits  LIPASE, BLOOD - Abnormal; Notable for the following:    Lipase 13 (*)    All other components within normal limits  CBC WITH DIFFERENTIAL/PLATELET    I have personally reviewed and evaluated these images and lab results as part of my medical decision-making.  Pulse oximetry 100% room air normal  9:30 AM Patient in no distress on repeat exam, states that symptoms have resolved. We discussed all findings at length. Patient will follow-up with primary care, initiate medication for likely gastroesophageal irritation. MDM   Final diagnoses:  Non-intractable vomiting with nausea, vomiting of unspecified type   well-appearing female presents with postprandial emesis, persistent nausea. On exam patient is awake and alert, in no  distress. No evidence for perforation, acute abdominal processes. Patient's symptoms improved substantially with topical medication, there suspicion for gastroesophageal etiology. Patient started on medication, will follow up with primary care.    Carmin Muskrat, MD 12/06/14 [phone]

## 2014-12-06 NOTE — Discharge Instructions (Signed)
As discussed, your evaluation today has been largely reassuring.  But, it is important that you monitor your condition carefully, and do not hesitate to return to the ED if you develop new, or concerning changes in your condition.  Your discomfort is likely due to irritation of the stomach and esophagus lining.  Please take all medication as directed.  Otherwise, please follow-up with your physician for appropriate ongoing care.

## 2014-12-06 NOTE — ED Notes (Signed)
Pt to ED with complaint of nausea/vomiting x2 days. Reports being unable to keep po fluids down. Denies pain. VSS. A/O x4.

## 2014-12-07 ENCOUNTER — Emergency Department (HOSPITAL_COMMUNITY)
Admission: EM | Admit: 2014-12-07 | Discharge: 2014-12-07 | Disposition: A | Discharge status: Home / Self Care | Payer: Self-pay | Attending: Emergency Medicine | Admitting: Emergency Medicine

## 2014-12-07 ENCOUNTER — Encounter (HOSPITAL_COMMUNITY): Payer: Self-pay | Admitting: Emergency Medicine

## 2014-12-07 DIAGNOSIS — Z880 Allergy status to penicillin: Secondary | ICD-10-CM | POA: Insufficient documentation

## 2014-12-07 DIAGNOSIS — N189 Chronic kidney disease, unspecified: Secondary | ICD-10-CM | POA: Insufficient documentation

## 2014-12-07 DIAGNOSIS — Z862 Personal history of diseases of the blood and blood-forming organs and certain disorders involving the immune mechanism: Secondary | ICD-10-CM | POA: Insufficient documentation

## 2014-12-07 DIAGNOSIS — R002 Palpitations: Secondary | ICD-10-CM | POA: Insufficient documentation

## 2014-12-07 DIAGNOSIS — Z9981 Dependence on supplemental oxygen: Secondary | ICD-10-CM | POA: Insufficient documentation

## 2014-12-07 DIAGNOSIS — J029 Acute pharyngitis, unspecified: Secondary | ICD-10-CM | POA: Insufficient documentation

## 2014-12-07 DIAGNOSIS — R0602 Shortness of breath: Secondary | ICD-10-CM | POA: Insufficient documentation

## 2014-12-07 DIAGNOSIS — Z91040 Latex allergy status: Secondary | ICD-10-CM | POA: Insufficient documentation

## 2014-12-07 DIAGNOSIS — Z720 Tobacco use: Secondary | ICD-10-CM | POA: Insufficient documentation

## 2014-12-07 DIAGNOSIS — Z8719 Personal history of other diseases of the digestive system: Secondary | ICD-10-CM | POA: Insufficient documentation

## 2014-12-07 DIAGNOSIS — R61 Generalized hyperhidrosis: Secondary | ICD-10-CM | POA: Insufficient documentation

## 2014-12-07 DIAGNOSIS — Z87448 Personal history of other diseases of urinary system: Secondary | ICD-10-CM | POA: Insufficient documentation

## 2014-12-07 DIAGNOSIS — R000 Tachycardia, unspecified: Secondary | ICD-10-CM | POA: Insufficient documentation

## 2014-12-07 DIAGNOSIS — R05 Cough: Secondary | ICD-10-CM | POA: Insufficient documentation

## 2014-12-07 DIAGNOSIS — Z87440 Personal history of urinary (tract) infections: Secondary | ICD-10-CM | POA: Insufficient documentation

## 2014-12-07 DIAGNOSIS — F419 Anxiety disorder, unspecified: Secondary | ICD-10-CM | POA: Insufficient documentation

## 2014-12-07 DIAGNOSIS — Z79899 Other long term (current) drug therapy: Secondary | ICD-10-CM | POA: Insufficient documentation

## 2014-12-07 DIAGNOSIS — G4730 Sleep apnea, unspecified: Secondary | ICD-10-CM | POA: Insufficient documentation

## 2014-12-07 LAB — COMPREHENSIVE METABOLIC PANEL
ALT: 26 U/L (ref 14–54)
AST: 35 U/L (ref 15–41)
Albumin: 3.6 g/dL (ref 3.5–5.0)
Alkaline Phosphatase: 100 U/L (ref 38–126)
Anion gap: 9 (ref 5–15)
BUN: 5 mg/dL — ABNORMAL LOW (ref 6–20)
CO2: 23 mmol/L (ref 22–32)
Calcium: 8.4 mg/dL — ABNORMAL LOW (ref 8.9–10.3)
Chloride: 102 mmol/L (ref 101–111)
Creatinine, Ser: 0.77 mg/dL (ref 0.44–1.00)
GFR calc Af Amer: >60 mL/min (ref >60)
GFR calc non Af Amer: >60 mL/min (ref >60)
Glucose, Bld: 102 mg/dL — ABNORMAL HIGH (ref 65–99)
Potassium: 3.6 mmol/L (ref 3.5–5.1)
Sodium: 134 mmol/L — ABNORMAL LOW (ref 135–145)
Total Bilirubin: 0.9 mg/dL (ref 0.3–1.2)
Total Protein: 6.8 g/dL (ref 6.5–8.1)

## 2014-12-07 LAB — CBC WITH DIFFERENTIAL/PLATELET
Basophils Absolute: 0 10*3/uL (ref 0.0–0.1)
Basophils Relative: 0 %
Eosinophils Absolute: 0.1 10*3/uL (ref 0.0–0.7)
Eosinophils Relative: 1 %
HCT: 37.6 % (ref 36.0–46.0)
Hemoglobin: 12.6 g/dL (ref 12.0–15.0)
Lymphocytes Relative: 22 %
Lymphs Abs: 2.1 10*3/uL (ref 0.7–4.0)
MCH: 29.2 pg (ref 26.0–34.0)
MCHC: 33.5 g/dL (ref 30.0–36.0)
MCV: 87.2 fL (ref 78.0–100.0)
Monocytes Absolute: 0.8 10*3/uL (ref 0.1–1.0)
Monocytes Relative: 8 %
Neutro Abs: 6.4 10*3/uL (ref 1.7–7.7)
Neutrophils Relative %: 69 %
Platelets: 289 10*3/uL (ref 150–400)
RBC: 4.31 MIL/uL (ref 3.87–5.11)
RDW: 14.7 % (ref 11.5–15.5)
WBC: 9.3 10*3/uL (ref 4.0–10.5)

## 2014-12-07 LAB — RAPID STREP SCREEN (MED CTR MEBANE ONLY): Streptococcus, Group A Screen (Direct): NEGATIVE

## 2014-12-07 NOTE — Discharge Instructions (Signed)
We saw you in the ER for the palpitations and sore throat. All the results in the ER are normal, labs and imaging.  The workup in the ER is not complete, and is limited to screening for life threatening and emergent conditions only, so please see a primary care doctor for further evaluation.  Please return to the ER if you have worsening chest pain, shortness of breath, pain radiating to your jaw, shoulder, or back, sweats or fainting. Otherwise see the Cardiologist or your primary care doctor as requested.    Palpitations A palpitation is the feeling that your heartbeat is irregular or is faster than normal. It may feel like your heart is fluttering or skipping a beat. Palpitations are usually not a serious problem. However, in some cases, you may need further medical evaluation. CAUSES  Palpitations can be caused by:  Smoking.  Caffeine or other stimulants, such as diet pills or energy drinks.  Alcohol.  Stress and anxiety.  Strenuous physical activity.  Fatigue.  Certain medicines.  Heart disease, especially if you have a history of irregular heart rhythms (arrhythmias), such as atrial fibrillation, atrial flutter, or supraventricular tachycardia.  An improperly working pacemaker or defibrillator. DIAGNOSIS  To find the cause of your palpitations, your health care provider will take your medical history and perform a physical exam. Your health care provider may also have you take a test called an ambulatory electrocardiogram (ECG). An ECG records your heartbeat patterns over a 24-hour period. You may also have other tests, such as:  Transthoracic echocardiogram (TTE). During echocardiography, sound waves are used to evaluate how blood flows through your heart.  Transesophageal echocardiogram (TEE).  Cardiac monitoring. This allows your health care provider to monitor your heart rate and rhythm in real time.  Holter monitor. This is a portable device that records your  heartbeat and can help diagnose heart arrhythmias. It allows your health care provider to track your heart activity for several days, if needed.  Stress tests by exercise or by giving medicine that makes the heart beat faster. TREATMENT  Treatment of palpitations depends on the cause of your symptoms and can vary greatly. Most cases of palpitations do not require any treatment other than time, relaxation, and monitoring your symptoms. Other causes, such as atrial fibrillation, atrial flutter, or supraventricular tachycardia, usually require further treatment. HOME CARE INSTRUCTIONS   Avoid:  Caffeinated coffee, tea, soft drinks, diet pills, and energy drinks.  Chocolate.  Alcohol.  Stop smoking if you smoke.  Reduce your stress and anxiety. Things that can help you relax include:  A method of controlling things in your body, such as your heartbeats, with your mind (biofeedback).  Yoga.  Meditation.  Physical activity such as swimming, jogging, or walking.  Get plenty of rest and sleep. SEEK MEDICAL CARE IF:   You continue to have a fast or irregular heartbeat beyond 24 hours.  Your palpitations occur more often. SEEK IMMEDIATE MEDICAL CARE IF:  You have chest pain or shortness of breath.  You have a severe headache.  You feel dizzy or you faint. MAKE SURE YOU:  Understand these instructions.  Will watch your condition.  Will get help right away if you are not doing well or get worse. Document Released: 02/17/2000 Document Revised: 02/24/2013 Document Reviewed: 04/20/2011 [facility] Patient Information 2015 [place], [place]. This information is not intended to replace advice given to you by your health care provider. Make sure you discuss any questions you have with your health care provider.

## 2014-12-07 NOTE — ED Provider Notes (Signed)
CSN: [id number]   Arrival date & time 12/07/14 0000  History  By signing my name below, I, Altamease Oiler, attest that this documentation has been prepared under the direction and in the presence of Varney Biles, MD. Electronically Signed: Altamease Oiler, ED Scribe. 12/07/2014. 4:02 AM.  Chief Complaint  Patient presents with  . Palpitations    HPI The history is provided by the patient. No language interpreter was used.   [name] is a 44 y.o. female with PMHx of bronchitis who presents to the Emergency Department complaining of increasing throat pain with onset yesterday after discharge from the hospital. The pain is described as a burning/"scratchy" sensation that is worse with swallowing. Associated symptoms include feeling hot, sweating, drooling and mild dry cough. This feels similar to the strep throat that she had in the past. Pt also complains of intermittent racing palpitations that she was seen for yesterday. With the episodes she has SOB.  She does not have the sensation at present.  Pt denies dizziness. No history of DVT/PE or thyroid disease. No recent long travel by car or plane, surgery, bone fracture, history of cancer, birth control, or hormonal therapy.    Past Medical History  Diagnosis Date  . Bronchitis   . Chronic bipolar disorder ([place])   . GERD (gastroesophageal reflux disease)   . Sleep apnea     CPAP  . Mental disorder     bipolar  . Anxiety   . Chronic kidney disease   . UTI (lower urinary tract infection) 10/2010  . Sickle cell trait ([place])   . Depression     bipolar  . Pyelonephritis 10/2010  . Shortness of breath     sometimes with exertion  . Recurrent upper respiratory infection (URI)     states she has not been to MD and feels like she has  bronchitis now- greenish sputum  . Blood transfusion     1989 at [place]  . Headache(784.0)     occasional    Past Surgical History  Procedure Laterality Date  . Cesarean section    .  Cesarean section    . Tubal ligation    . Orif tibia plateau  03/08/2011    Procedure: OPEN REDUCTION INTERNAL FIXATION (ORIF) TIBIAL PLATEAU;  Surgeon: Rozanna Box;  Location: [facility];  Service: Orthopedics;  Laterality: Left;  . Knee arthroscopy  06/15/2011    Procedure: ARTHROSCOPY KNEE;  Surgeon: Rozanna Box, MD;  Location: [facility];  Service: Orthopedics;  Laterality: Left;  LEFT KNEE SCOPE WITH LYSIS OF ADHESIONS AND MANIPULATION    Family History  Problem Relation Age of Onset  . Thyroid disease Father   . Diabetes Other   . Cancer Other   . Anesthesia problems Neg Hx     Social History  Substance Use Topics  . Smoking status: Current Every Day Smoker -- 0.00 packs/day for 24 years    Types: Cigarettes  . Smokeless tobacco: Never Used  . Alcohol Use: Yes     Comment: ocassion social     Review of Systems  Constitutional: Negative for fever and chills.       Feeling hot  HENT: Positive for drooling and sore throat.   Respiratory: Positive for cough and shortness of breath.   Cardiovascular: Positive for palpitations.  Gastrointestinal: Negative for nausea and vomiting.  Neurological: Negative for weakness.  All other systems reviewed and are negative.  Home Medications   Prior to Admission medications  Medication Sig Start Date End Date Taking? Authorizing Provider  omeprazole (PRILOSEC) 20 MG capsule Take 1 capsule (20 mg total) by mouth daily. 12/06/14  Yes Carmin Muskrat, MD  prazosin (MINIPRESS) 2 MG capsule Take 2 mg by mouth at bedtime.   Yes Historical Provider, MD  sucralfate (CARAFATE) 1 GM/10ML suspension Take 10 mLs (1 g total) by mouth 4 (four) times daily. 12/06/14 12/13/14 Yes Carmin Muskrat, MD    Allergies  Aspirin; Banana; Latex; Penicillins; Septra; Shellfish allergy; Strawberry; and Tape  Triage Vitals: BP 141/82 mmHg  Pulse 90  Temp(Src) 99.7 F (37.6 C) (Oral)  Resp 21  SpO2 100%  LMP 12/06/2014  Physical Exam  Constitutional: She is  oriented to person, place, and time. She appears well-developed and well-nourished. No distress.  HENT:  Head: Normocephalic and atraumatic.  Left sided tonsillar enlargement with exudates Tonsillar erythema  Neck: Normal range of motion.  Cardiovascular: Regular rhythm.  Tachycardia present.   No murmur heard. 2+ and equal radial pulse bilaterally  Pulmonary/Chest: Effort normal. No respiratory distress.  CTAB  Musculoskeletal: Normal range of motion.  Lymphadenopathy:    She has cervical adenopathy.  Neurological: She is alert and oriented to person, place, and time.  Skin: Skin is warm and dry. She is not diaphoretic.  Psychiatric: She has a normal mood and affect.  Nursing note and vitals reviewed.   ED Course  Procedures   DIAGNOSTIC STUDIES: Oxygen Saturation is 100% on RA, normal by my interpretation.    COORDINATION OF CARE: 3:40 AM Discussed treatment plan which includes lab work and EKG with pt at bedside and pt agreed to plan.  Labs Reviewed  COMPREHENSIVE METABOLIC PANEL - Abnormal; Notable for the following:    Sodium 134 (*)    Glucose, Bld 102 (*)    BUN 5 (*)    Calcium 8.4 (*)    All other components within normal limits  RAPID STREP SCREEN (NOT AT [facility])  CULTURE, GROUP A STREP  CBC WITH DIFFERENTIAL/PLATELET    Imaging Review No results found.  EKG Interpretation  Date/Time:  Tuesday December 07 2014 00:08:09 EDT Ventricular Rate:  96 PR Interval:  118 QRS Duration: 70 QT Interval:  356 QTC Calculation: 449 R Axis:   74 Text Interpretation:  Normal sinus rhythm Normal ECG No significant change since last tracing Confirmed by Asante Ritacco, MD, Corby Villasenor [phone]) on 12/07/2014 3:03:02 AM    MDM   Final diagnoses:  Intermittent palpitations  Acute pharyngitis, unspecified pharyngitis type     I personally performed the services described in this documentation, which was scribed in my presence. The recorded information has been reviewed and is  accurate.  Pt comes in with palpitations. No near syncope or syncopw with it. She has no cardiac hx, and her social hx is reassuring. EKG here looks unremarkable, there is no family hx of premature CAD or sudden deaths.  Pt also has exudates on her throat exam. Rapid strep ordered, if neg, will culture the swab.    Varney Biles, MD 12/07/14 2330

## 2014-12-07 NOTE — ED Notes (Signed)
Pt. reports intermittent palpitations with mild SOB onset last week , denies chest pain / no nausea or diaphoresis .

## 2014-12-08 ENCOUNTER — Emergency Department (HOSPITAL_COMMUNITY)
Admission: EM | Admit: 2014-12-08 | Discharge: 2014-12-08 | Disposition: A | Discharge status: Home / Self Care | Payer: Self-pay | Attending: Emergency Medicine | Admitting: Emergency Medicine

## 2014-12-08 ENCOUNTER — Encounter (HOSPITAL_COMMUNITY): Payer: Self-pay | Admitting: Emergency Medicine

## 2014-12-08 DIAGNOSIS — Z87440 Personal history of urinary (tract) infections: Secondary | ICD-10-CM | POA: Insufficient documentation

## 2014-12-08 DIAGNOSIS — Z91040 Latex allergy status: Secondary | ICD-10-CM | POA: Insufficient documentation

## 2014-12-08 DIAGNOSIS — Z862 Personal history of diseases of the blood and blood-forming organs and certain disorders involving the immune mechanism: Secondary | ICD-10-CM | POA: Insufficient documentation

## 2014-12-08 DIAGNOSIS — F319 Bipolar disorder, unspecified: Secondary | ICD-10-CM | POA: Insufficient documentation

## 2014-12-08 DIAGNOSIS — J029 Acute pharyngitis, unspecified: Secondary | ICD-10-CM

## 2014-12-08 DIAGNOSIS — N189 Chronic kidney disease, unspecified: Secondary | ICD-10-CM | POA: Insufficient documentation

## 2014-12-08 DIAGNOSIS — F419 Anxiety disorder, unspecified: Secondary | ICD-10-CM | POA: Insufficient documentation

## 2014-12-08 DIAGNOSIS — Z720 Tobacco use: Secondary | ICD-10-CM | POA: Insufficient documentation

## 2014-12-08 DIAGNOSIS — G4730 Sleep apnea, unspecified: Secondary | ICD-10-CM | POA: Insufficient documentation

## 2014-12-08 DIAGNOSIS — Z79899 Other long term (current) drug therapy: Secondary | ICD-10-CM | POA: Insufficient documentation

## 2014-12-08 DIAGNOSIS — Z8709 Personal history of other diseases of the respiratory system: Secondary | ICD-10-CM | POA: Insufficient documentation

## 2014-12-08 DIAGNOSIS — Z880 Allergy status to penicillin: Secondary | ICD-10-CM | POA: Insufficient documentation

## 2014-12-08 DIAGNOSIS — Z9981 Dependence on supplemental oxygen: Secondary | ICD-10-CM | POA: Insufficient documentation

## 2014-12-08 MED ORDER — CLINDAMYCIN HCL 150 MG PO CAPS: 300.0000 mg | ORAL_CAPSULE | Freq: Three times a day (TID) | ORAL | Status: DC

## 2014-12-08 NOTE — ED Provider Notes (Signed)
CSN: [id number]     Arrival date & time 12/08/14  1835 History  By signing my name below, I, Soijett Blue, attest that this documentation has been prepared under the direction and in the presence of Quincy Carnes, PA-C Electronically Signed: Soijett Blue, ED Scribe. 12/08/2014. 6:57 PM.   Chief Complaint  Patient presents with  . Sore Throat      The history is provided by the patient. No language interpreter was used.    [name] is a 44 y.o. female who presents to the Emergency Department complaining of sore throat onset 2 days. She notes that she has been to [facility] twice and they informed her that nothing was wrong and she was not Rx any medications. She reports that she had a strep test completed that returned negative. She states that she catches strep throat every year around this time. She states that she is having associated symptoms of appetite change, painful swallowing, and intermittent undocumented fever. She states that she has tried hot tea without medications with no relief for her symptoms. She denies chills, color change, rash, wound, and any other symptoms. She notes that she is allergic to penicillin and septra.  VSS.  Past Medical History  Diagnosis Date  . Bronchitis   . Chronic bipolar disorder ([place])   . GERD (gastroesophageal reflux disease)   . Sleep apnea     CPAP  . Mental disorder     bipolar  . Anxiety   . Chronic kidney disease   . UTI (lower urinary tract infection) 10/2010  . Sickle cell trait ([place])   . Depression     bipolar  . Pyelonephritis 10/2010  . Shortness of breath     sometimes with exertion  . Recurrent upper respiratory infection (URI)     states she has not been to [facility] and feels like she has  bronchitis now- greenish sputum  . Blood transfusion     1989 at [place]  . Headache(784.0)     occasional   Past Surgical History  Procedure Laterality Date  . Cesarean section    . Cesarean section    . Tubal ligation    . Orif  tibia plateau  03/08/2011    Procedure: OPEN REDUCTION INTERNAL FIXATION (ORIF) TIBIAL PLATEAU;  Surgeon: Rozanna Box;  Location: [facility];  Service: Orthopedics;  Laterality: Left;  . Knee arthroscopy  06/15/2011    Procedure: ARTHROSCOPY KNEE;  Surgeon: Rozanna Box, MD;  Location: [facility];  Service: Orthopedics;  Laterality: Left;  LEFT KNEE SCOPE WITH LYSIS OF ADHESIONS AND MANIPULATION   Family History  Problem Relation Age of Onset  . Thyroid disease Father   . Diabetes Other   . Cancer Other   . Anesthesia problems Neg Hx    Social History  Substance Use Topics  . Smoking status: Current Every Day Smoker -- 0.00 packs/day for 24 years    Types: Cigarettes  . Smokeless tobacco: Never Used  . Alcohol Use: Yes     Comment: ocassion social   OB History    No data available     Review of Systems  HENT: Positive for sore throat.   Skin: Negative for color change, rash and wound.      Allergies  Aspirin; Banana; Latex; Penicillins; Septra; Shellfish allergy; Strawberry; and Tape  Home Medications   Prior to Admission medications   Medication Sig Start Date End Date Taking? Authorizing Provider  omeprazole (PRILOSEC) 20 MG capsule  Take 1 capsule (20 mg total) by mouth daily. 12/06/14   Carmin Muskrat, MD  prazosin (MINIPRESS) 2 MG capsule Take 2 mg by mouth at bedtime.    Historical Provider, MD  sucralfate (CARAFATE) 1 GM/10ML suspension Take 10 mLs (1 g total) by mouth 4 (four) times daily. 12/06/14 12/13/14  Carmin Muskrat, MD   BP 158/86 mmHg  Pulse 98  Temp(Src) 98.7 F (37.1 C) (Oral)  Resp 18  Ht 5\' 7"  (1.702 m)  Wt 229 lb (103.874 kg)  BMI 35.86 kg/m2  SpO2 100%  LMP 12/06/2014   Physical Exam  Constitutional: She is oriented to person, place, and time. She appears well-developed and well-nourished. No distress.  HENT:  Head: Normocephalic and atraumatic.  Tonsils 2+ bilaterally with exudates present; uvula midline without peritonsillar abscess; handling  secretions appropriately; no difficulty swallowing or speaking; normal phonation, no stridor  Eyes: EOM are normal.  Neck: Neck supple.  Cardiovascular: Normal rate.   Pulmonary/Chest: Effort normal. No respiratory distress. She has no wheezes.  Abdominal: Soft. There is no tenderness.  Musculoskeletal: Normal range of motion.  Lymphadenopathy:    She has cervical adenopathy.  Neurological: She is alert and oriented to person, place, and time.  Skin: Skin is warm and dry.  Psychiatric: She has a normal mood and affect. Her behavior is normal.  Nursing note and vitals reviewed.   ED Course  Procedures (including critical care time) DIAGNOSTIC STUDIES: Oxygen Saturation is 100% on RA, nl by my interpretation.    COORDINATION OF CARE: 6:56 PM Discussed treatment plan with pt at bedside which includes clindamycin Rx, saltwater gargles and pt agreed to plan.    Labs Review Labs Reviewed - No data to display  Imaging Review No results found.    EKG Interpretation None      MDM   Final diagnoses:  Sore throat   44 year old female here with sore throat for 2 days. States history of strep throat every year around this time. Patient is afebrile, nontoxic. She has tonsillar edema bilaterally with exudates present. Uvula remains midline without evidence of peritonsillar abscess. She is handling her secretions well, no difficulty swallowing or speaking. Clinically patient appears to have strep pharyngitis. She did have a rapid strep performed yesterday which was negative, culture still pending at this time. Will start treatment with clindamycin given her penicillin allergy. Discussed supportive care including Chloraseptic spray and/or salt water gargles.  Discussed plan with patient, he/she acknowledged understanding and agreed with plan of care.  Return precautions given for new or worsening symptoms.  I personally performed the services described in this documentation, which was  scribed in my presence. The recorded information has been reviewed and is accurate.  Larene Pickett, PA-C 12/08/14 1912  Merrily Pew, MD 12/09/14 [phone]

## 2014-12-08 NOTE — Discharge Instructions (Signed)
Take the prescribed medication as directed. May use over the counter chloraseptic spray if needed to help with throat pain.  Salt water gargles, warm tea, popsicles, etc may help as well. Follow-up with your primary care physician. Return to the ED for new or worsening symptoms.

## 2014-12-08 NOTE — ED Notes (Signed)
Sore throat x2 days. difficulty eating and drinking. Has been seen at [facility] yesterday was not prescribed anything.

## 2014-12-09 LAB — CULTURE, GROUP A STREP: Strep A Culture: POSITIVE — AB

## 2014-12-10 ENCOUNTER — Telehealth (HOSPITAL_COMMUNITY): Payer: Self-pay

## 2014-12-10 NOTE — Telephone Encounter (Signed)
Post ED Visit - Positive Culture Follow-up  Culture report reviewed by antimicrobial stewardship pharmacist:  [x]  Heide Guile, Pharm.D., BCPS []  Alycia Rossetti, Pharm.D., BCPS []  [place], Pharm.D., BCPS, AAHIVP []  Legrand Como, Pharm.D., BCPS, AAHIVP []  [place], Pharm.D. []  Cassie Pena, [place].D.  Positive throat culture, Group A Strep Treated with Clindamycin,tx OK, no further patient follow-up is required at this time.  Dortha Kern 12/10/2014, 10:03 AM

## 2016-09-14 ENCOUNTER — Ambulatory Visit: Payer: Self-pay

## 2016-11-06 ENCOUNTER — Ambulatory Visit (INDEPENDENT_AMBULATORY_CARE_PROVIDER_SITE_OTHER): Payer: Self-pay | Admitting: Physician Assistant

## 2016-11-06 ENCOUNTER — Encounter (INDEPENDENT_AMBULATORY_CARE_PROVIDER_SITE_OTHER): Payer: Self-pay | Admitting: Physician Assistant

## 2016-11-06 VITALS — BP 136/84 | HR 80 | Temp 98.4°F | Wt 254.0 lb

## 2016-11-06 DIAGNOSIS — M25562 Pain in left knee: Secondary | ICD-10-CM

## 2016-11-06 DIAGNOSIS — M5442 Lumbago with sciatica, left side: Secondary | ICD-10-CM

## 2016-11-06 DIAGNOSIS — M5441 Lumbago with sciatica, right side: Secondary | ICD-10-CM

## 2016-11-06 DIAGNOSIS — G8929 Other chronic pain: Secondary | ICD-10-CM

## 2016-11-06 DIAGNOSIS — Z131 Encounter for screening for diabetes mellitus: Secondary | ICD-10-CM

## 2016-11-06 DIAGNOSIS — G4730 Sleep apnea, unspecified: Secondary | ICD-10-CM

## 2016-11-06 DIAGNOSIS — R1013 Epigastric pain: Secondary | ICD-10-CM

## 2016-11-06 DIAGNOSIS — Z23 Encounter for immunization: Secondary | ICD-10-CM

## 2016-11-06 DIAGNOSIS — K219 Gastro-esophageal reflux disease without esophagitis: Secondary | ICD-10-CM

## 2016-11-06 DIAGNOSIS — M25561 Pain in right knee: Secondary | ICD-10-CM

## 2016-11-06 LAB — POCT GLYCOSYLATED HEMOGLOBIN (HGB A1C): Hemoglobin A1C: 5.3

## 2016-11-06 MED ORDER — ACETAMINOPHEN-CODEINE #3 300-30 MG PO TABS: 1.0000 | ORAL_TABLET | ORAL | 0 refills | Status: AC | PRN

## 2016-11-06 MED ORDER — ESOMEPRAZOLE MAGNESIUM 40 MG PO CPDR: 40.0000 mg | DELAYED_RELEASE_CAPSULE | Freq: Every day | ORAL | 3 refills | Status: DC

## 2016-11-06 MED FILL — ESOMEPRAZOLE MAG DR 40 MG C: 40 | 30 days supply | Qty: 30 | Fill #0

## 2016-11-06 MED FILL — ACETAMINOPHEN/COD #3 TABLET: 300-30 | 7 days supply | Qty: 42 | Fill #0

## 2016-11-06 NOTE — Patient Instructions (Signed)
Food Choices for Gastroesophageal Reflux Disease, Adult When you have gastroesophageal reflux disease (GERD), the foods you eat and your eating habits are very important. Choosing the right foods can help ease your discomfort. What guidelines do I need to follow?  Choose fruits, vegetables, whole grains, and low-fat dairy products.  Choose low-fat meat, fish, and poultry.  Limit fats such as oils, salad dressings, butter, nuts, and avocado.  Keep a food diary. This helps you identify foods that cause symptoms.  Avoid foods that cause symptoms. These may be different for everyone.  Eat small meals often instead of 3 large meals a day.  Eat your meals slowly, in a place where you are relaxed.  Limit fried foods.  Cook foods using methods other than frying.  Avoid drinking alcohol.  Avoid drinking large amounts of liquids with your meals.  Avoid bending over or lying down until 2-3 hours after eating. What foods are not recommended? These are some foods and drinks that may make your symptoms worse: Vegetables  Tomatoes. Tomato juice. Tomato and spaghetti sauce. Chili peppers. Onion and garlic. Horseradish. Fruits  Oranges, grapefruit, and lemon (fruit and juice). Meats  High-fat meats, fish, and poultry. This includes hot dogs, ribs, ham, sausage, salami, and bacon. Dairy  Whole milk and chocolate milk. Sour cream. Cream. Butter. Ice cream. Cream cheese. Drinks  Coffee and tea. Bubbly (carbonated) drinks or energy drinks. Condiments  Hot sauce. Barbecue sauce. Sweets/Desserts  Chocolate and cocoa. Donuts. Peppermint and spearmint. Fats and Oils  High-fat foods. This includes French fries and potato chips. Other  Vinegar. Strong spices. This includes black pepper, white pepper, red pepper, cayenne, curry powder, cloves, ginger, and chili powder. The items listed above may not be a complete list of foods and drinks to avoid. Contact your dietitian for more information.    This information is not intended to replace advice given to you by your health care provider. Make sure you discuss any questions you have with your health care provider. Document Released: 08/21/2011 Document Revised: 07/28/2015 Document Reviewed: 12/24/2012 Elsevier Interactive Patient Education  2017 [employer]  
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
## 2016-11-07 LAB — COMPREHENSIVE METABOLIC PANEL
ALT: 23 IU/L (ref 0–32)
AST: 32 IU/L (ref 0–40)
Albumin/Globulin Ratio: 1.4 (ref 1.2–2.2)
Albumin: 4.2 g/dL (ref 3.5–5.5)
Alkaline Phosphatase: 124 IU/L — ABNORMAL HIGH (ref 39–117)
BUN/Creatinine Ratio: 10 (ref 9–23)
BUN: 8 mg/dL (ref 6–24)
Bilirubin Total: 0.4 mg/dL (ref 0.0–1.2)
CO2: 20 mmol/L (ref 20–29)
Calcium: 9.5 mg/dL (ref 8.7–10.2)
Chloride: 105 mmol/L (ref 96–106)
Creatinine, Ser: 0.81 mg/dL (ref 0.57–1.00)
GFR calc Af Amer: 101 mL/min/{1.73_m2} (ref >59)
GFR calc non Af Amer: 87 mL/min/{1.73_m2} (ref >59)
Globulin, Total: 3.1 g/dL (ref 1.5–4.5)
Glucose: 102 mg/dL — ABNORMAL HIGH (ref 65–99)
Potassium: 4.5 mmol/L (ref 3.5–5.2)
Sodium: 137 mmol/L (ref 134–144)
Total Protein: 7.3 g/dL (ref 6.0–8.5)

## 2016-11-07 LAB — CBC WITH DIFFERENTIAL/PLATELET
Basophils Absolute: 0 10*3/uL (ref 0.0–0.2)
Basos: 1 %
EOS (ABSOLUTE): 0.1 10*3/uL (ref 0.0–0.4)
Eos: 1 %
Hematocrit: 38.5 % (ref 34.0–46.6)
Hemoglobin: 12.1 g/dL (ref 11.1–15.9)
Immature Grans (Abs): 0 10*3/uL (ref 0.0–0.1)
Immature Granulocytes: 0 %
Lymphocytes Absolute: 2.9 10*3/uL (ref 0.7–3.1)
Lymphs: 47 %
MCH: 26.9 pg (ref 26.6–33.0)
MCHC: 31.4 g/dL — ABNORMAL LOW (ref 31.5–35.7)
MCV: 86 fL (ref 79–97)
Monocytes Absolute: 0.4 10*3/uL (ref 0.1–0.9)
Monocytes: 6 %
Neutrophils Absolute: 2.8 10*3/uL (ref 1.4–7.0)
Neutrophils: 45 %
Platelets: 395 10*3/uL — ABNORMAL HIGH (ref 150–379)
RBC: 4.5 x10E6/uL (ref 3.77–5.28)
RDW: 17.4 % — ABNORMAL HIGH (ref 12.3–15.4)
WBC: 6.3 10*3/uL (ref 3.4–10.8)

## 2016-11-07 LAB — H. PYLORI ANTIBODY, IGG: H. pylori, IgG AbS: <0.8 Index Value (ref 0.00–0.79)

## 2016-11-07 LAB — LIPASE: Lipase: 12 U/L — ABNORMAL LOW (ref 14–72)

## 2016-11-12 ENCOUNTER — Other Ambulatory Visit (INDEPENDENT_AMBULATORY_CARE_PROVIDER_SITE_OTHER): Payer: Self-pay | Admitting: Physician Assistant

## 2016-11-12 DIAGNOSIS — R400 Somnolence: Secondary | ICD-10-CM

## 2016-11-13 ENCOUNTER — Telehealth (INDEPENDENT_AMBULATORY_CARE_PROVIDER_SITE_OTHER): Payer: Self-pay | Admitting: *Deleted

## 2016-11-13 NOTE — Telephone Encounter (Signed)
-----   Message from Clent Demark, PA-C sent at 11/12/2016 12:32 PM EDT ----- Negative for stomach bacteria. Rest of labs are unremarkable.

## 2016-11-15 NOTE — Telephone Encounter (Signed)
-----   Message from Clent Demark, PA-C sent at 11/12/2016 12:32 PM EDT ----- Negative for stomach bacteria. Rest of labs are unremarkable.

## 2016-11-15 NOTE — Telephone Encounter (Signed)
Medical Assistant left message on patient's home and cell voicemail. Voicemail states to give a call back to [place] with [facility] at [phone]. !!!Please inform patient of stomach bacteria being negative and all other labs are normal!!

## 2016-11-20 ENCOUNTER — Ambulatory Visit (HOSPITAL_COMMUNITY)
Admission: RE | Admit: 2016-11-20 | Discharge: 2016-11-20 | Disposition: A | Discharge status: Home / Self Care | Payer: Self-pay | Source: Ambulatory Visit | Attending: Physician Assistant | Admitting: Physician Assistant

## 2016-11-20 ENCOUNTER — Ambulatory Visit (INDEPENDENT_AMBULATORY_CARE_PROVIDER_SITE_OTHER): Payer: Self-pay | Admitting: Physician Assistant

## 2016-11-20 ENCOUNTER — Telehealth (INDEPENDENT_AMBULATORY_CARE_PROVIDER_SITE_OTHER): Payer: Self-pay

## 2016-11-20 ENCOUNTER — Other Ambulatory Visit (INDEPENDENT_AMBULATORY_CARE_PROVIDER_SITE_OTHER): Payer: Self-pay | Admitting: Physician Assistant

## 2016-11-20 DIAGNOSIS — M170 Bilateral primary osteoarthritis of knee: Secondary | ICD-10-CM | POA: Insufficient documentation

## 2016-11-20 DIAGNOSIS — G8929 Other chronic pain: Secondary | ICD-10-CM | POA: Insufficient documentation

## 2016-11-20 DIAGNOSIS — M545 Low back pain, unspecified: Secondary | ICD-10-CM

## 2016-11-20 DIAGNOSIS — M5442 Lumbago with sciatica, left side: Secondary | ICD-10-CM | POA: Insufficient documentation

## 2016-11-20 DIAGNOSIS — M5441 Lumbago with sciatica, right side: Secondary | ICD-10-CM | POA: Insufficient documentation

## 2016-11-20 DIAGNOSIS — M25561 Pain in right knee: Principal | ICD-10-CM

## 2016-11-20 DIAGNOSIS — M25562 Pain in left knee: Secondary | ICD-10-CM | POA: Insufficient documentation

## 2016-11-20 DIAGNOSIS — Z9889 Other specified postprocedural states: Secondary | ICD-10-CM | POA: Insufficient documentation

## 2016-11-20 NOTE — Telephone Encounter (Signed)
-----   Message from Clent Demark, PA-C sent at 11/20/2016  2:19 PM EDT ----- Left knee XR- degenerative changes of the knee with some progression of degeneration of the medial joint space compared to previous XR. I will send patient to physical therapy if she is agreeable to being sent. Please let me know.

## 2016-11-20 NOTE — Telephone Encounter (Signed)
Patient has been provided with all Xray findings, patient does want to be referred to PT. Explained to patient the process of the referral, she expressed understanding. Nat Christen, CMA

## 2016-11-20 NOTE — Telephone Encounter (Signed)
Physical therapy has been ordered for her knees and back.

## 2016-11-28 ENCOUNTER — Encounter (INDEPENDENT_AMBULATORY_CARE_PROVIDER_SITE_OTHER): Payer: Self-pay | Admitting: Physician Assistant

## 2016-11-28 ENCOUNTER — Ambulatory Visit (INDEPENDENT_AMBULATORY_CARE_PROVIDER_SITE_OTHER): Payer: Self-pay | Admitting: Physician Assistant

## 2016-11-28 VITALS — BP 120/80 | HR 80 | Temp 98.2°F | Ht 67.5 in | Wt 250.4 lb

## 2016-11-28 DIAGNOSIS — Z114 Encounter for screening for human immunodeficiency virus [HIV]: Secondary | ICD-10-CM

## 2016-11-28 DIAGNOSIS — Z23 Encounter for immunization: Secondary | ICD-10-CM

## 2016-11-28 DIAGNOSIS — K5900 Constipation, unspecified: Secondary | ICD-10-CM

## 2016-11-28 DIAGNOSIS — R1013 Epigastric pain: Secondary | ICD-10-CM

## 2016-11-28 MED ORDER — LINACLOTIDE 290 MCG PO CAPS: 290.0000 ug | ORAL_CAPSULE | Freq: Every day | ORAL | 2 refills | Status: DC

## 2016-11-28 NOTE — Progress Notes (Signed)
Subjective:  Patient ID: [name], female    DOB: [birth year]  Age: 46 y.o. MRN: [id number]  CC: annual exam  HPI  [name] is a 46 y.o. female with a medical history of Anxiety, Bipolar disorder, depression, GERD, and sleep apnea presents for an annual exam. Has continued epigastric pain. Lipase, CBC, and CMP normal. Has associated constipation (2 BMs per week), early satiety, occasional nausea. A1c 5.3% 22 days ago. Does not endorse other symptoms or complaints.       Outpatient Medications Prior to Visit  Medication Sig Dispense Refill  . esomeprazole (NEXIUM) 40 MG capsule Take 1 capsule (40 mg total) by mouth daily. 30 capsule 3  . prazosin (MINIPRESS) 2 MG capsule Take 2 mg by mouth at bedtime.    . sucralfate (CARAFATE) 1 GM/10ML suspension Take 10 mLs (1 g total) by mouth 4 (four) times daily. 420 mL 0   No facility-administered medications prior to visit.      ROS Review of Systems  Constitutional: Negative for chills, fever and malaise/fatigue.  Eyes: Negative for blurred vision.  Respiratory: Negative for shortness of breath.   Cardiovascular: Negative for chest pain and palpitations.  Gastrointestinal: Positive for abdominal pain, constipation and nausea. Negative for vomiting.  Genitourinary: Negative for dysuria and hematuria.  Musculoskeletal: Negative for joint pain and myalgias.  Skin: Negative for rash.  Neurological: Negative for tingling and headaches.  Psychiatric/Behavioral: Negative for depression. The patient is not nervous/anxious.     Objective:  BP 120/80 (BP Location: Left Arm, Patient Position: Sitting, Cuff Size: Large)   Pulse 80   Temp 98.2 F (36.8 C) (Oral)   Ht 5' 7.5" (1.715 m)   Wt 250 lb 6.4 oz (113.6 kg)   LMP 11/13/2016 (Approximate)   SpO2 99%   BMI 38.64 kg/m   BP/Weight 11/28/2016 11/06/2016 04/11/3783  Systolic BP 885 027 741  Diastolic BP 80 84 86  Wt. (Lbs) 250.4 254 229  BMI 38.64 39.78 35.86       Physical Exam  Constitutional: She is oriented to person, place, and time.  Well developed, obese, NAD, polite  HENT:  Head: Normocephalic and atraumatic.  Eyes: Conjunctivae are normal. No scleral icterus.  Neck: Normal range of motion. Neck supple. No thyromegaly present.  Cardiovascular: Normal rate, regular rhythm and normal heart sounds.   Pulmonary/Chest: Effort normal and breath sounds normal.  Abdominal: Soft. Bowel sounds are normal. There is tenderness (TTP in the RUQ and epigastrium).  Musculoskeletal: She exhibits no edema.  Lymphadenopathy:    She has no cervical adenopathy.  Neurological: She is alert and oriented to person, place, and time. No cranial nerve deficit. Coordination normal.  Skin: Skin is warm and dry. No rash noted. No erythema. No pallor.  Psychiatric: She has a normal mood and affect. Her behavior is normal. Thought content normal.  Vitals reviewed.    Assessment & Plan:   1. Epigastric pain - CT Abdomen Pelvis Wo Contrast; Future - Lipid Panel; Future - CBC with Differential - Comprehensive metabolic panel - Lipase  2. Constipation, unspecified constipation type - Pt had been taking OTC stool softeners and laxatives long term with waning relief. She was advised to stop taking laxatives for periods longer than one week. - Begin linaclotide (LINZESS) 290 MCG CAPS capsule; Take 1 capsule (290 mcg total) by mouth daily before breakfast.  Dispense: 30 capsule; Refill: 2  3. Need for prophylactic vaccination and inoculation against influenza - Flu Vaccine QUAD  6+ mos PF IM (Fluarix Quad PF)  4. Encounter for screening for HIV - HIV antibody   Meds ordered this encounter  Medications  . linaclotide (LINZESS) 290 MCG CAPS capsule    Sig: Take 1 capsule (290 mcg total) by mouth daily before breakfast.    Dispense:  30 capsule    Refill:  2    Order Specific Question:   Supervising Provider    Answer:   Tresa Garter [id number]     Follow-up: Return if symptoms worsen or fail to improve, for PAP smear.   Clent Demark PA

## 2016-11-28 NOTE — Patient Instructions (Signed)

## 2016-11-29 LAB — CBC WITH DIFFERENTIAL/PLATELET
Basophils Absolute: 0 10*3/uL (ref 0.0–0.2)
Basos: 0 %
EOS (ABSOLUTE): 0.1 10*3/uL (ref 0.0–0.4)
Eos: 2 %
Hematocrit: 38.4 % (ref 34.0–46.6)
Hemoglobin: 12 g/dL (ref 11.1–15.9)
Immature Grans (Abs): 0 10*3/uL (ref 0.0–0.1)
Immature Granulocytes: 0 %
Lymphocytes Absolute: 3.4 10*3/uL — ABNORMAL HIGH (ref 0.7–3.1)
Lymphs: 50 %
MCH: 26.5 pg — ABNORMAL LOW (ref 26.6–33.0)
MCHC: 31.3 g/dL — ABNORMAL LOW (ref 31.5–35.7)
MCV: 85 fL (ref 79–97)
Monocytes Absolute: 0.6 10*3/uL (ref 0.1–0.9)
Monocytes: 8 %
Neutrophils Absolute: 2.7 10*3/uL (ref 1.4–7.0)
Neutrophils: 40 %
Platelets: 379 10*3/uL (ref 150–379)
RBC: 4.53 x10E6/uL (ref 3.77–5.28)
RDW: 16.5 % — ABNORMAL HIGH (ref 12.3–15.4)
WBC: 6.8 10*3/uL (ref 3.4–10.8)

## 2016-11-29 LAB — COMPREHENSIVE METABOLIC PANEL
ALT: 24 IU/L (ref 0–32)
AST: 24 IU/L (ref 0–40)
Albumin/Globulin Ratio: 1.4 (ref 1.2–2.2)
Albumin: 4.2 g/dL (ref 3.5–5.5)
Alkaline Phosphatase: 121 IU/L — ABNORMAL HIGH (ref 39–117)
BUN/Creatinine Ratio: 8 — ABNORMAL LOW (ref 9–23)
BUN: 6 mg/dL (ref 6–24)
Bilirubin Total: 0.2 mg/dL (ref 0.0–1.2)
CO2: 22 mmol/L (ref 20–29)
Calcium: 9.4 mg/dL (ref 8.7–10.2)
Chloride: 100 mmol/L (ref 96–106)
Creatinine, Ser: 0.77 mg/dL (ref 0.57–1.00)
GFR calc Af Amer: 107 mL/min/{1.73_m2} (ref >59)
GFR calc non Af Amer: 93 mL/min/{1.73_m2} (ref >59)
Globulin, Total: 3.1 g/dL (ref 1.5–4.5)
Glucose: 99 mg/dL (ref 65–99)
Potassium: 4.1 mmol/L (ref 3.5–5.2)
Sodium: 138 mmol/L (ref 134–144)
Total Protein: 7.3 g/dL (ref 6.0–8.5)

## 2016-11-29 LAB — HIV ANTIBODY (ROUTINE TESTING W REFLEX): HIV Screen 4th Generation wRfx: NONREACTIVE

## 2016-11-29 LAB — LIPASE: Lipase: 14 U/L (ref 14–72)

## 2016-11-30 ENCOUNTER — Telehealth (INDEPENDENT_AMBULATORY_CARE_PROVIDER_SITE_OTHER): Payer: Self-pay | Admitting: Physician Assistant

## 2016-11-30 ENCOUNTER — Telehealth (INDEPENDENT_AMBULATORY_CARE_PROVIDER_SITE_OTHER): Payer: Self-pay

## 2016-11-30 NOTE — Telephone Encounter (Signed)
Left a message asking patient to call the office. If she does call, please inform her of normal labs. Nat Christen, CMA

## 2016-11-30 NOTE — Telephone Encounter (Signed)
-----   Message from Clent Demark, PA-C sent at 11/29/2016  4:22 PM EDT ----- Labs normal/unremarkable.

## 2016-11-30 NOTE — Telephone Encounter (Signed)
Patient provided with normal lab results. Nat Christen, CMA

## 2016-11-30 NOTE — Telephone Encounter (Signed)
Pt called about the lab result, she still wan to talk to the nurse , please follow up

## 2016-12-03 ENCOUNTER — Ambulatory Visit (HOSPITAL_COMMUNITY): Payer: Self-pay

## 2016-12-06 ENCOUNTER — Ambulatory Visit (HOSPITAL_COMMUNITY): Admission: RE | Admit: 2016-12-06 | Payer: Self-pay | Source: Ambulatory Visit

## 2016-12-12 ENCOUNTER — Ambulatory Visit (HOSPITAL_COMMUNITY)
Admission: RE | Admit: 2016-12-12 | Discharge: 2016-12-12 | Disposition: A | Discharge status: Home / Self Care | Payer: Self-pay | Source: Ambulatory Visit | Attending: Physician Assistant | Admitting: Physician Assistant

## 2016-12-12 DIAGNOSIS — D259 Leiomyoma of uterus, unspecified: Secondary | ICD-10-CM | POA: Insufficient documentation

## 2016-12-12 DIAGNOSIS — R1013 Epigastric pain: Secondary | ICD-10-CM | POA: Insufficient documentation

## 2016-12-13 ENCOUNTER — Ambulatory Visit: Payer: Self-pay | Attending: Physician Assistant | Admitting: Physical Therapy

## 2016-12-13 ENCOUNTER — Telehealth (INDEPENDENT_AMBULATORY_CARE_PROVIDER_SITE_OTHER): Payer: Self-pay

## 2016-12-13 ENCOUNTER — Ambulatory Visit (INDEPENDENT_AMBULATORY_CARE_PROVIDER_SITE_OTHER): Payer: Self-pay | Admitting: Physician Assistant

## 2016-12-13 DIAGNOSIS — M25561 Pain in right knee: Secondary | ICD-10-CM | POA: Insufficient documentation

## 2016-12-13 DIAGNOSIS — G8929 Other chronic pain: Secondary | ICD-10-CM | POA: Insufficient documentation

## 2016-12-13 DIAGNOSIS — R262 Difficulty in walking, not elsewhere classified: Secondary | ICD-10-CM | POA: Insufficient documentation

## 2016-12-13 DIAGNOSIS — M545 Low back pain: Secondary | ICD-10-CM | POA: Insufficient documentation

## 2016-12-13 DIAGNOSIS — M25562 Pain in left knee: Secondary | ICD-10-CM | POA: Insufficient documentation

## 2016-12-13 NOTE — Telephone Encounter (Signed)
Patient aware of CT results and to keep follow up appointments. Nat Christen, CMA

## 2016-12-13 NOTE — Telephone Encounter (Signed)
-----   Message from Clent Demark, PA-C sent at 12/13/2016  8:40 AM EDT ----- No acute intra-abdominal or pelvic finding by noncontrast CT. Small uterine fibroids. Keep f/u appt.

## 2016-12-19 ENCOUNTER — Ambulatory Visit (INDEPENDENT_AMBULATORY_CARE_PROVIDER_SITE_OTHER): Payer: Self-pay | Admitting: Physician Assistant

## 2016-12-19 MED FILL — ESOMEPRAZOLE MAGNESIUM 40 M: 40 | 30 days supply | Qty: 30 | Fill #1

## 2016-12-31 ENCOUNTER — Other Ambulatory Visit (HOSPITAL_COMMUNITY)
Admission: RE | Admit: 2016-12-31 | Discharge: 2016-12-31 | Disposition: A | Discharge status: Home / Self Care | Payer: Self-pay | Source: Ambulatory Visit | Attending: Physician Assistant | Admitting: Physician Assistant

## 2016-12-31 ENCOUNTER — Encounter (INDEPENDENT_AMBULATORY_CARE_PROVIDER_SITE_OTHER): Payer: Self-pay | Admitting: Physician Assistant

## 2016-12-31 ENCOUNTER — Ambulatory Visit (INDEPENDENT_AMBULATORY_CARE_PROVIDER_SITE_OTHER): Payer: Self-pay | Admitting: Physician Assistant

## 2016-12-31 VITALS — BP 131/87 | HR 77 | Temp 98.0°F | Wt 247.6 lb

## 2016-12-31 DIAGNOSIS — Z124 Encounter for screening for malignant neoplasm of cervix: Secondary | ICD-10-CM

## 2016-12-31 DIAGNOSIS — N898 Other specified noninflammatory disorders of vagina: Secondary | ICD-10-CM

## 2016-12-31 MED ORDER — FLUCONAZOLE 150 MG PO TABS: 150.0000 mg | ORAL_TABLET | Freq: Once | ORAL | 0 refills | Status: AC

## 2016-12-31 NOTE — Patient Instructions (Signed)

## 2016-12-31 NOTE — Progress Notes (Signed)
Subjective:  Patient ID: [name], female    DOB: 07-31-1970  Age: 46 y.o. MRN: [id number]  CC: PAP smear  HPI  [name] a 46 y.o.femalewith a medical history of Anxiety, Bipolar disorder, depression, GERD, and sleep apnea presents for PAP smear. Endorses dyspareunia. Also has vaginal discharge with itching. Discharge last noticed three days ago. Does not endorse genital lesions or cramping/bleeding outside of her normal period.     Outpatient Medications Prior to Visit  Medication Sig Dispense Refill  . esomeprazole (NEXIUM) 40 MG capsule Take 1 capsule (40 mg total) by mouth daily. 30 capsule 3  . linaclotide (LINZESS) 290 MCG CAPS capsule Take 1 capsule (290 mcg total) by mouth daily before breakfast. (Patient not taking: Reported on 12/31/2016) 30 capsule 2  . prazosin (MINIPRESS) 2 MG capsule Take 2 mg by mouth at bedtime.    . sucralfate (CARAFATE) 1 GM/10ML suspension Take 10 mLs (1 g total) by mouth 4 (four) times daily. 420 mL 0   No facility-administered medications prior to visit.      ROS Review of Systems  Constitutional: Negative for chills, fever and malaise/fatigue.  Eyes: Negative for blurred vision.  Respiratory: Negative for shortness of breath.   Cardiovascular: Negative for chest pain and palpitations.  Gastrointestinal: Negative for abdominal pain and nausea.  Genitourinary: Negative for dysuria and hematuria.       Dyspareunia and vaginal discharge  Musculoskeletal: Negative for joint pain and myalgias.  Skin: Negative for rash.  Neurological: Negative for tingling and headaches.  Psychiatric/Behavioral: Negative for depression. The patient is not nervous/anxious.     Objective:  BP 131/87 (BP Location: Left Arm, Patient Position: Sitting, Cuff Size: Large)   Pulse 77   Temp 98 F (36.7 C) (Oral)   Wt 247 lb 9.6 oz (112.3 kg)   LMP 12/08/2016 (Approximate)   SpO2 99%   BMI 38.21 kg/m   BP/Weight 12/31/2016 11/28/2016  4/0/9811  Systolic BP 914 782 956  Diastolic BP 87 80 84  Wt. (Lbs) 247.6 250.4 254  BMI 38.21 38.64 39.78      Physical Exam  Constitutional: She is oriented to person, place, and time.  Well developed, obese, NAD, polite  HENT:  Head: Normocephalic and atraumatic.  Pulmonary/Chest: Effort normal.  Genitourinary:  Genitourinary Comments: Thick and white vaginal discharge, cervix with few small erythematous macules, no cervical motion tenderness. Mild left sided adnexal TTP, no mass felt. Right adnexa without tenderness or mass. Uterus without tenderness or mass.  Neurological: She is alert and oriented to person, place, and time.  Skin: Skin is warm and dry. No rash noted. No erythema. No pallor.  No genital lesions  Psychiatric: She has a normal mood and affect. Her behavior is normal. Thought content normal.  Vitals reviewed.    Assessment & Plan:   1. Screening for cervical cancer - Cytology - PAP [place]  2. Vaginal itching - Begin fluconazole (DIFLUCAN) 150 MG tablet; Take 1 tablet (150 mg total) by mouth once.  Dispense: 1 tablet; Refill: 0   Meds ordered this encounter  Medications  . fluconazole (DIFLUCAN) 150 MG tablet    Sig: Take 1 tablet (150 mg total) by mouth once.    Dispense:  1 tablet    Refill:  0    Order Specific Question:   Supervising Provider    Answer:   Tresa Garter [id number]    Follow-up: Return if symptoms worsen or fail to improve.  Clent Demark PA

## 2017-01-01 ENCOUNTER — Encounter: Payer: Self-pay | Admitting: Physical Therapy

## 2017-01-01 ENCOUNTER — Ambulatory Visit: Payer: Self-pay | Admitting: Physical Therapy

## 2017-01-01 DIAGNOSIS — R262 Difficulty in walking, not elsewhere classified: Secondary | ICD-10-CM

## 2017-01-01 DIAGNOSIS — M25562 Pain in left knee: Secondary | ICD-10-CM

## 2017-01-01 DIAGNOSIS — M545 Low back pain, unspecified: Secondary | ICD-10-CM

## 2017-01-01 DIAGNOSIS — G8929 Other chronic pain: Secondary | ICD-10-CM

## 2017-01-01 DIAGNOSIS — M25561 Pain in right knee: Secondary | ICD-10-CM

## 2017-01-01 LAB — CYTOLOGY - PAP
Bacterial vaginitis: POSITIVE — AB
Candida vaginitis: NEGATIVE
Chlamydia: NEGATIVE
Diagnosis: NEGATIVE
Neisseria Gonorrhea: NEGATIVE
Trichomonas: NEGATIVE

## 2017-01-02 ENCOUNTER — Encounter: Payer: Self-pay | Admitting: Physical Therapy

## 2017-01-02 ENCOUNTER — Other Ambulatory Visit (INDEPENDENT_AMBULATORY_CARE_PROVIDER_SITE_OTHER): Payer: Self-pay | Admitting: Physician Assistant

## 2017-01-02 DIAGNOSIS — B9689 Other specified bacterial agents as the cause of diseases classified elsewhere: Secondary | ICD-10-CM

## 2017-01-02 DIAGNOSIS — N760 Acute vaginitis: Principal | ICD-10-CM

## 2017-01-02 MED ORDER — METRONIDAZOLE 500 MG PO TABS: 500.0000 mg | ORAL_TABLET | Freq: Two times a day (BID) | ORAL | 0 refills | Status: AC

## 2017-01-02 NOTE — Therapy (Addendum)
[place], [place], [address] Phone: [phone]   Fax:  [phone]  Physical Therapy Evaluation/ Discharge   Patient Details  Name: [name] MRN: [id number] Date of Birth: [birth year] Referring Provider: Elveria Rising PA-C   Encounter Date: 01/01/2017      PT End of Session - 01/02/17 1206    Visit Number 1   Number of Visits 12   Date for PT Re-Evaluation 02/13/17   Authorization Type Self pay    PT Start Time 1330   PT Stop Time 1414   PT Time Calculation (min) 44 min   Activity Tolerance Patient tolerated treatment well   Behavior During Therapy [facility] for tasks assessed/performed      Past Medical History:  Diagnosis Date  . Anxiety   . Blood transfusion    1989 at [place]  . Bronchitis   . Chronic bipolar disorder ([place])   . Chronic kidney disease   . Depression    bipolar  . GERD (gastroesophageal reflux disease)   . Headache(784.0)    occasional  . Mental disorder    bipolar  . Pyelonephritis 10/2010  . Recurrent upper respiratory infection (URI)    states she has not been to MD and feels like she has  bronchitis now- greenish sputum  . Shortness of breath    sometimes with exertion  . Sickle cell trait ([place])   . Sleep apnea    CPAP  . UTI (lower urinary tract infection) 10/2010    Past Surgical History:  Procedure Laterality Date  . CESAREAN SECTION    . CESAREAN SECTION    . KNEE ARTHROSCOPY  06/15/2011   Procedure: ARTHROSCOPY KNEE;  Surgeon: Rozanna Box, MD;  Location: [place];  Service: Orthopedics;  Laterality: Left;  LEFT KNEE SCOPE WITH LYSIS OF ADHESIONS AND MANIPULATION  . ORIF TIBIA PLATEAU  03/08/2011   Procedure: OPEN REDUCTION INTERNAL FIXATION (ORIF) TIBIAL PLATEAU;  Surgeon: Rozanna Box;  Location: [place];  Service: Orthopedics;  Laterality: Left;  . TUBAL LIGATION      There were no vitals filed for this visit.       Subjective  Assessment - 01/01/17 1341    Subjective Patient is a 46 year old female with a 5 year history of low back pain and bilateral knee pain. She has had surgery on her left knee. Her pain is increased in her knee and hip when she stands at work. She stiffens when she stands for too long.    Limitations Sitting;Standing   How long can you sit comfortably? Stiffens up after 45 min to an hour    How long can you stand comfortably? < 1/2 hour    How long can you walk comfortably? limited conmmunity distances with pain    Diagnostic tests X-ray: lumbar: mild degeneration R Knee: mild degeneration L knee: chronic degenration.    Currently in Pain? 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    Pain Relieving Factors rest;    Effect of Pain on Daily Activities difficulty perfroming ADL's    Multiple Pain Sites Yes   Pain Score 6   Pain Location Back  Pain Orientation Right   Pain Descriptors / Indicators Aching   Pain Type Chronic pain   Pain Onset More than a month ago   Pain Frequency Constant   Aggravating Factors  standing at work    Pain Relieving Factors rest    Effect of Pain on Daily Activities difficulty perfroming daily tasks             [facility] PT Assessment - 01/02/17 0001      Assessment   Medical Diagnosis Low back pain    Referring Provider Elveria Rising PA-C    Onset Date/Surgical Date --  5 years prior    Hand Dominance Right   Next MD Visit 4 weeks    Prior Therapy For left knee 3 years prior     Precautions   Precautions None     Restrictions   Weight Bearing Restrictions No     Balance Screen   Has the patient fallen in the past 6 months No   Has the patient had a decrease in activity level because of a fear of falling?  No   Is the patient  reluctant to leave their home because of a fear of falling?  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   Leisure walking; going to the gym      Cognition   Overall Cognitive Status Within Functional Limits for tasks assessed   Attention Focused   Focused Attention Appears intact   Memory Appears intact   Awareness Appears intact   Problem Solving Appears intact     Observation/Other Assessments   Focus on Therapeutic Outcomes (FOTO)  50% limitation      Sensation   Light Touch Appears Intact   Additional Comments Pain into bilateral hips      Coordination   Gross Motor Movements are Fluid and Coordinated Yes   Fine Motor Movements are Fluid and Coordinated Yes     Posture/Postural Control   Posture Comments Rounded shoulders; forrward head      PROM   Overall PROM Comments Pain in bilateral knees; Pain  with bilateral hip flexion left hip flexion to 80 degrees    Right/Left Hip Right;Left     Strength   Right/Left Shoulder Right;Left   Right/Left Hip Right;Left   Right Hip Flexion 4/5   Right Hip ABduction 4/5   Left Hip Flexion 3/5   Left Hip ABduction 3+/5   Right/Left Knee Right;Left   Right Knee Flexion 5/5   Right Knee Extension 5/5   Left Knee Flexion 3/5   Left Knee Extension 3/5     Palpation   Palpation comment spasming in bilateral lumbar spine      Ambulation/Gait   Gait Comments decreased hip rotation; decreased hip flexion; decreased left sinlge leg stance             Objective measurements completed on examination: See above findings.          [facility] Adult PT Treatment/Exercise - 01/02/17 0001      Lumbar Exercises: Stretches   Active Hamstring Stretch Limitations 90/90 x20 sec each    Single Knee to Chest Stretch Limitations reviewed how to do with towel    Lower Trunk Rotation Limitations x10    Piriformis  Stretch Limitations reviewed how to do with towel      Lumbar Exercises: Seated  Other Seated Lumbar Exercises ball squeeze x10; hip abduction with abdominal brace x10      Knee/Hip Exercises: Supine   Quad Sets Limitations x10 bilateral                 PT Education - 01/02/17 1205    Education provided Yes   Education Details reviewed HEP; Symptom mangement; private pay    Person(s) Educated Patient   Methods Demonstration;Explanation;Tactile cues;Verbal cues   Comprehension Verbalized understanding;Returned demonstration;Verbal cues required;Tactile cues required;Need further instruction          PT Short Term Goals - 01/02/17 1213      PT SHORT TERM GOAL #1   Title Patient will increase billateral lower extremity strength to 4/5    Time 4   Period Weeks   Status New   Target Date 01/30/17     PT SHORT TERM GOAL #2   Title Patient will demostrate a good core contraction    Time 4   Period Weeks   Status New   Target Date 01/30/17     PT SHORT TERM GOAL #3   Title Patient will be independent with HEP    Time 4   Period Weeks   Status New   Target Date 01/30/17     PT SHORT TERM GOAL #4   Title Patient will demsotrate pain free passive knee ROM in functional range    Time 4   Period Weeks   Status New   Target Date 01/30/17     PT SHORT TERM GOAL #5   Title Patient will demsotrate full pain free passive hip flexion bilateral    Time 4   Period Weeks   Status New   Target Date 01/30/17           PT Long Term Goals - 01/02/17 1216      PT LONG TERM GOAL #1   Title Patient will stand for 1 hour without self report of pain in order to perfrom work tasks   Time 6   Period Weeks   Status New   Target Date 02/13/17     PT LONG TERM GOAL #2   Title Patient will sit for 4 hours without self report of pain and stiffness in her back   Time 6   Period Weeks   Status New     PT LONG TERM GOAL #3   Title Patient will demsotrate a 40%  limitation on FOTO    Time 6   Period Weeks   Status New   Target Date 02/13/17                Plan - 01/02/17 1206    Clinical Impression Statement Patient is a 46 year old female who presents with bilateral knee pain and lower back pain. She has significant weakness of the left lower extremity. She alos has significant hypermobility of her lumbar spine. She has spasming of the lu,bar paraspinals and limited motion of the left knee. She would benefit from skilled therapy to improve strength, mobility, and stability of lumbar spine and knees.    History and Personal Factors relevant to plan of care: Bipolar, CKD, sickle cell trait    Clinical Presentation Evolving   Clinical Decision Making Low   Rehab Potential Good   PT Frequency 2x / week   PT Duration 8 weeks   PT Treatment/Interventions ADLs/Self Care Home Management;Cryotherapy;Electrical Stimulation;Iontophoresis 60m/ml Dexamethasone;Gait training;Stair training;Ultrasound;Therapeutic activities;Therapeutic exercise;Patient/family education;Passive range of motion;Dry needling;Taping;Manual  techniques;Neuromuscular re-education   PT Next Visit Plan add SAQ; Heel slide; bridge if able; review SLR; Patient given full HEP 2nd to private pay. Therapy explained private pay to the patient. Review which exercises and stretches have helped her; Consoder manual therapy to lumbar spine.    PT Home Exercise Plan Given comple HEP. See instructions    Consulted and Agree with Plan of Care Patient      Patient will benefit from skilled therapeutic intervention in order to improve the following deficits and impairments:  Decreased range of motion, Difficulty walking, Pain, Decreased strength, Decreased activity tolerance, Decreased safety awareness, Decreased endurance, Increased muscle spasms, Postural dysfunction  Visit Diagnosis: Chronic bilateral low back pain without sciatica - Plan: PT plan of care cert/re-cert  Chronic pain of left  knee - Plan: PT plan of care cert/re-cert  Chronic pain of right knee - Plan: PT plan of care cert/re-cert  Difficulty in walking, not elsewhere classified - Plan: PT plan of care cert/re-cert   PHYSICAL THERAPY DISCHARGE SUMMARY  Visits from Start of Care: 1 Current functional level related to goals / functional outcomes: Only came for 1st visit    Remaining deficits: Only came for initial Eval     Education / Equipment: HEP  Plan: Patient agrees to discharge.  Patient goals were not met. Patient is being discharged due to not returning since the last visit.  ?????       Problem List Patient Active Problem List   Diagnosis Date Noted  . Fracture of tibial plateau, closed 03/08/2011  . GERD (gastroesophageal reflux disease) 03/08/2011  . Bipolar disorder ([place]) 03/08/2011  . Sleep apnea 03/08/2011  . Obesity 03/08/2011  . Nicotine dependence 03/08/2011    Carney Living PT DPT  01/02/2017, 12:37 PM  [facility] [address] [place], [place], [address] Phone: [phone]   Fax:  [phone]  Name: [name] MRN: [id number] Date of Birth: 1970/08/17

## 2017-01-03 ENCOUNTER — Telehealth (INDEPENDENT_AMBULATORY_CARE_PROVIDER_SITE_OTHER): Payer: Self-pay

## 2017-01-03 NOTE — Telephone Encounter (Signed)
Patient aware of pap being negative for malignancy but positive for BV and Rx sent to pharmacy. [name], CMA

## 2017-01-03 NOTE — Telephone Encounter (Signed)
-----   Message from Clent Demark, PA-C sent at 01/02/2017  4:45 PM EDT ----- PAP Negative for malignancy. Positive for BV. I will send metronidazole to her pharmacy.

## 2017-01-09 ENCOUNTER — Ambulatory Visit: Payer: Self-pay | Attending: Physician Assistant | Admitting: Physical Therapy

## 2017-01-09 ENCOUNTER — Telehealth: Payer: Self-pay | Admitting: Physical Therapy

## 2017-01-09 ENCOUNTER — Encounter: Payer: Self-pay | Admitting: Physical Therapy

## 2017-01-09 NOTE — Telephone Encounter (Signed)
Attempted to call patient regarding no-show appointment. Patient did not answer and her mailbox was full.

## 2017-01-16 ENCOUNTER — Telehealth: Payer: Self-pay | Admitting: Physical Therapy

## 2017-01-16 ENCOUNTER — Ambulatory Visit: Payer: Self-pay | Admitting: Physical Therapy

## 2017-01-16 NOTE — Telephone Encounter (Signed)
Spoke with patient regarding no- show to appointment this morning. She reports she forgot about the appointment due to another conflicting appointment today. She plans to attend her next appointment on 01/22/2017.

## 2017-01-21 MED FILL — FLUCONAZOLE 150 MG TABLET: 150 | 1 days supply | Qty: 1 | Fill #0

## 2017-01-21 MED FILL — ?ESOMEPRAZOLE MAG DR 40MG C: 40 | 30 days supply | Qty: 30 | Fill #2

## 2017-01-22 ENCOUNTER — Telehealth: Payer: Self-pay | Admitting: Physical Therapy

## 2017-01-22 ENCOUNTER — Ambulatory Visit: Payer: Self-pay | Admitting: Physical Therapy

## 2017-01-22 NOTE — Telephone Encounter (Signed)
Left message regarding her second consecutive no show today. She has no future appointments scheduled and will be allowed to schedule one at a time going forward, per our attendance policy.

## 2017-01-31 ENCOUNTER — Telehealth (INDEPENDENT_AMBULATORY_CARE_PROVIDER_SITE_OTHER): Payer: Self-pay | Admitting: Physician Assistant

## 2017-01-31 NOTE — Telephone Encounter (Signed)
There was a referral placed on 9/4, but patient has not heard anything and there are no notes attached to the referral. Please look into this and advise. Nat Christen, CMA

## 2017-01-31 NOTE — Telephone Encounter (Signed)
Patient called requesting to speak with you regarding sleep study  Thank You

## 2017-02-06 ENCOUNTER — Other Ambulatory Visit (INDEPENDENT_AMBULATORY_CARE_PROVIDER_SITE_OTHER): Payer: Self-pay | Admitting: Physician Assistant

## 2017-02-06 DIAGNOSIS — G4730 Sleep apnea, unspecified: Secondary | ICD-10-CM

## 2017-02-06 NOTE — Telephone Encounter (Signed)
I see that the sleep study has been authorized but no further notes. I have put in another referral in case it is needed. Please tell me if I am missing anything. Thank you.

## 2017-02-07 ENCOUNTER — Other Ambulatory Visit (INDEPENDENT_AMBULATORY_CARE_PROVIDER_SITE_OTHER): Payer: Self-pay | Admitting: Physician Assistant

## 2017-02-07 DIAGNOSIS — R400 Somnolence: Secondary | ICD-10-CM

## 2017-02-07 NOTE — Telephone Encounter (Signed)
Ok, I have reordered. Thank you.

## 2017-02-07 NOTE — Telephone Encounter (Signed)
I called sleep study and you need to put it as an Order and sent it to [facility]  Sleep disorder center in [place]

## 2017-02-13 ENCOUNTER — Encounter: Payer: Self-pay | Admitting: Physical Therapy

## 2017-03-08 ENCOUNTER — Ambulatory Visit (HOSPITAL_COMMUNITY)
Admission: EM | Admit: 2017-03-08 | Discharge: 2017-03-08 | Disposition: A | Discharge status: Home / Self Care | Payer: Self-pay | Attending: Emergency Medicine | Admitting: Emergency Medicine

## 2017-03-08 ENCOUNTER — Other Ambulatory Visit: Payer: Self-pay

## 2017-03-08 ENCOUNTER — Encounter (HOSPITAL_COMMUNITY): Payer: Self-pay | Admitting: Emergency Medicine

## 2017-03-08 DIAGNOSIS — J019 Acute sinusitis, unspecified: Secondary | ICD-10-CM

## 2017-03-08 DIAGNOSIS — B9689 Other specified bacterial agents as the cause of diseases classified elsewhere: Secondary | ICD-10-CM

## 2017-03-08 MED ORDER — AZITHROMYCIN 250 MG PO TABS: 250.0000 mg | ORAL_TABLET | Freq: Every day | ORAL | 0 refills | Status: AC

## 2017-03-08 NOTE — ED Provider Notes (Signed)
[place]    CSN: [id number] Arrival date & time: 03/08/17  1659     History   Chief Complaint Chief Complaint  Patient presents with  . Cough  . Nasal Congestion    HPI [name] is a 47 y.o. female Patient is presenting with URI symptoms- congestion, cough, sore throat. Also hot/cold chills, ear pain, watery eyes. Patient's main complaints are cough and congestion. Symptoms have been going on for over 1 week. Patient has tried Theraflu, with minimal relief. Symptoms improved then worsened in the last 2 days. Denies fever, nausea, vomiting, diarrhea. Denies shortness of breath and chest pain.    HPI  Past Medical History:  Diagnosis Date  . Anxiety   . Blood transfusion    1989 at [place]  . Bronchitis   . Chronic bipolar disorder ([place])   . Chronic kidney disease   . Depression    bipolar  . GERD (gastroesophageal reflux disease)   . Headache(784.0)    occasional  . Mental disorder    bipolar  . Pyelonephritis 10/2010  . Recurrent upper respiratory infection (URI)    states she has not been to MD and feels like she has  bronchitis now- greenish sputum  . Shortness of breath    sometimes with exertion  . Sickle cell trait ([place])   . Sleep apnea    CPAP  . UTI (lower urinary tract infection) 10/2010    Patient Active Problem List   Diagnosis Date Noted  . Fracture of tibial plateau, closed 03/08/2011  . GERD (gastroesophageal reflux disease) 03/08/2011  . Bipolar disorder ([place]) 03/08/2011  . Sleep apnea 03/08/2011  . Obesity 03/08/2011  . Nicotine dependence 03/08/2011    Past Surgical History:  Procedure Laterality Date  . CESAREAN SECTION    . CESAREAN SECTION    . KNEE ARTHROSCOPY  06/15/2011   Procedure: ARTHROSCOPY KNEE;  Surgeon: Rozanna Box, MD;  Location: [place];  Service: Orthopedics;  Laterality: Left;  LEFT KNEE SCOPE WITH LYSIS OF ADHESIONS AND MANIPULATION  . ORIF TIBIA PLATEAU  03/08/2011   Procedure: OPEN REDUCTION  INTERNAL FIXATION (ORIF) TIBIAL PLATEAU;  Surgeon: Rozanna Box;  Location: [place];  Service: Orthopedics;  Laterality: Left;  . TUBAL LIGATION      OB History    No data available       Home Medications    Prior to Admission medications   Medication Sig Start Date End Date Taking? Authorizing Provider  esomeprazole (NEXIUM) 40 MG capsule Take 1 capsule (40 mg total) by mouth daily. 11/06/16  Yes Clent Demark, PA-C  azithromycin (ZITHROMAX) 250 MG tablet Take 1 tablet (250 mg total) by mouth daily for 5 days. Take first 2 tablets together, then 1 every day until finished. 03/08/17 03/13/17  Naftuli Dalsanto C, PA-C  prazosin (MINIPRESS) 2 MG capsule Take 2 mg by mouth at bedtime.    [provider]    Family History Family History  Problem Relation Age of Onset  . Thyroid disease Father   . Diabetes Other   . Cancer Other   . Anesthesia problems Neg Hx     Social History Social History   Tobacco Use  . Smoking status: Current Every Day Smoker    Packs/day: 0.00    Years: 24.00    Pack years: 0.00    Types: Cigarettes  . Smokeless tobacco: Never Used  Substance Use Topics  . Alcohol use: Yes  Comment: ocassion social  . Drug use: Yes    Types: "Crack" cocaine    Comment: none since 2000     Allergies   Aspirin; Banana; Latex; Penicillins; Septra [bactrim]; Shellfish allergy; Strawberry extract; and Tape   Review of Systems Review of Systems  Constitutional: Positive for chills. Negative for fatigue and fever.  HENT: Positive for congestion, ear pain, rhinorrhea, sinus pressure and sore throat. Negative for trouble swallowing.   Eyes: Positive for itching.  Respiratory: Positive for cough. Negative for chest tightness and shortness of breath.   Cardiovascular: Negative for chest pain.  Gastrointestinal: Negative for abdominal pain, nausea and vomiting.  Musculoskeletal: Negative for myalgias.  Skin: Negative for rash.  Neurological: Negative for  dizziness, light-headedness and headaches.     Physical Exam Triage Vital Signs ED Triage Vitals [03/08/17 1754]  Enc Vitals Group     BP (!) 148/96     Pulse Rate 92     Resp 16     Temp 98.6 F (37 C)     Temp src      SpO2 100 %     Weight      Height      Head Circumference      Peak Flow      Pain Score      Pain Loc      Pain Edu?      Excl. in [place]?    No data found.  Updated Vital Signs BP (!) 148/96   Pulse 92   Temp 98.6 F (37 C)   Resp 16   LMP 03/08/2017   SpO2 100%   Visual Acuity Right Eye Distance:   Left Eye Distance:   Bilateral Distance:    Right Eye Near:   Left Eye Near:    Bilateral Near:     Physical Exam  Constitutional: She is oriented to person, place, and time. She appears well-developed and well-nourished. No distress.  HENT:  Head: Normocephalic and atraumatic.  Right Ear: Tympanic membrane and ear canal normal.  Left Ear: Tympanic membrane and ear canal normal.  Nose: Rhinorrhea present.  Mouth/Throat: Uvula is midline and mucous membranes are normal. No oral lesions. No trismus in the jaw. No uvula swelling. Posterior oropharyngeal erythema present. Tonsillar exudate.  Eyes: Conjunctivae are normal. Right eye exhibits no discharge. Left eye exhibits no discharge.  Neck: Neck supple.  Cardiovascular: Normal rate and regular rhythm.  No murmur heard. Pulmonary/Chest: Effort normal and breath sounds normal. No respiratory distress. She has no wheezes.  Moving air throughout lung fields bilaterally without adventitious sounds.  Abdominal: Soft. She exhibits no distension. There is no tenderness.  Musculoskeletal: She exhibits no edema.  Lymphadenopathy:    She has no cervical adenopathy.  Neurological: She is alert and oriented to person, place, and time.  Skin: Skin is warm and dry.  Psychiatric: She has a normal mood and affect.  Nursing note and vitals reviewed.    UC Treatments / Results  Labs (all labs ordered are  listed, but only abnormal results are displayed) Labs Reviewed - No data to display  EKG  EKG Interpretation None       Radiology No results found.  Procedures Procedures (including critical care time)  Medications Ordered in UC Medications - No data to display   Initial Impression / Assessment and Plan / UC Course  I have reviewed the triage vital signs and the nursing notes.  Pertinent labs & imaging results that were available  during my care of the patient were reviewed by me and considered in my medical decision making (see chart for details).     Patient presents with symptoms likely from sinusitis vs. viral upper respiratory infection. Differential includes bacterial pneumonia, sinusitis, allergic rhinitis, acute bronchitis. Do not suspect underlying cardiopulmonary process. Symptoms seem unlikely related to ACS, CHF or COPD exacerbations, pneumonia, pneumothorax. Patient is nontoxic appearing and not in need of emergent medical intervention.  Given length of symptoms and double sickening, provided azithromycin- PCN allergic, unclear tolerance of cephalosporins. Recommended symptom control with over the counter medications: Daily oral anti-histamine, Oral decongestant or IN corticosteroid, saline irrigations, cepacol lozenges, Robitussin, Delsym, honey tea.  Return if symptoms fail to improve in 1-2 weeks or you develop shortness of breath, chest pain, severe headache. Patient states understanding and is agreeable.   Final Clinical Impressions(s) / UC Diagnoses   Final diagnoses:  Acute bacterial sinusitis    ED Discharge Orders        Ordered    azithromycin (ZITHROMAX) 250 MG tablet  Daily     03/08/17 1911       Controlled Substance Prescriptions [place] Controlled Substance Registry consulted? Not Applicable   Janith Lima, [place] 03/08/17 2144

## 2017-03-08 NOTE — Discharge Instructions (Signed)
We are treating you for a sinus infection with azithromycin. We recommended symptom control also. I expect your symptoms to start improving in the next 1 week.   1. Take a daily allergy pill/anti-histamine like Zyrtec, Claritin, or Store brand consistently for 2 weeks  2. For congestion you may try an oral decongestant like Mucinex or sudafed. You may also try intranasal flonase nasal spray or saline irrigations (neti pot, sinus cleanse)  3. For your sore throat you may try cepacol lozenges, salt water gargles, throat spray. Treatment of congestion may also help your sore throat.  4. For cough you may try Robitussen, Mucinex Dm, delsym  5. Take Tylenol or Ibuprofen to help with pain/inflammation  6. Stay hydrated, drink plenty of fluids to keep throat coated and less irritated  Honey Tea For cough/sore throat try using a honey-based tea. Use 3 teaspoons of honey with juice squeezed from half lemon. Place shaved pieces of ginger into 1/2-1 cup of water and warm over stove top. Then mix the ingredients and repeat every 4 hours as needed.

## 2017-03-08 NOTE — ED Triage Notes (Signed)
Pt c/o coughing, itchy throat x1 week. Pt also c/o hot and cold chills.

## 2017-03-11 MED FILL — AZITHROMYCIN 250 MG TABLET: 250 | 5 days supply | Qty: 6 | Fill #0

## 2017-03-11 MED FILL — ?ESOMEPRAZOLE MAG DR 40MG C: 40 | 30 days supply | Qty: 30 | Fill #3

## 2017-03-15 ENCOUNTER — Ambulatory Visit (HOSPITAL_BASED_OUTPATIENT_CLINIC_OR_DEPARTMENT_OTHER): Payer: Self-pay | Attending: Physician Assistant | Admitting: Internal Medicine

## 2017-03-15 VITALS — Ht 67.0 in | Wt 230.0 lb

## 2017-03-15 DIAGNOSIS — G4733 Obstructive sleep apnea (adult) (pediatric): Secondary | ICD-10-CM | POA: Insufficient documentation

## 2017-03-15 DIAGNOSIS — R400 Somnolence: Secondary | ICD-10-CM

## 2017-03-15 DIAGNOSIS — G4710 Hypersomnia, unspecified: Secondary | ICD-10-CM | POA: Insufficient documentation

## 2017-03-16 ENCOUNTER — Encounter (HOSPITAL_BASED_OUTPATIENT_CLINIC_OR_DEPARTMENT_OTHER): Payer: Self-pay

## 2017-03-24 DIAGNOSIS — R400 Somnolence: Secondary | ICD-10-CM

## 2017-03-24 NOTE — Procedures (Signed)
   Patient Name: [name], [name] Date: 03/15/2017 Gender: Female D.O.B: 01/26/1971 Age (years): 46 Referring Provider: Clent Demark PA Height (inches): 79 Interpreting Physician: Baird Lyons MD, ABSM Weight (lbs): 220 RPSGT: Lanae Boast BMI: 34 MRN: [id number] Neck Size: 18.00 <br> <br> CLINICAL INFORMATION Sleep Study Type: NPSG  Indication for sleep study: Daytime Fatigue  Epworth Sleepiness Score: 6  SLEEP STUDY TECHNIQUE As per the AASM Manual for the Scoring of Sleep and Associated Events v2.3 (April 2016) with a hypopnea requiring 4% desaturations.  The channels recorded and monitored were frontal, central and occipital EEG, electrooculogram (EOG), submentalis EMG (chin), nasal and oral airflow, thoracic and abdominal wall motion, anterior tibialis EMG, snore microphone, electrocardiogram, and pulse oximetry.  MEDICATIONS Medications self-administered by patient taken the night of the study : none reported  SLEEP ARCHITECTURE The study was initiated at 10:40:32 PM and ended at 5:11:33 AM.  Sleep onset time was 17.2 minutes and the sleep efficiency was 84.6%. The total sleep time was 330.8 minutes.  Stage REM latency was 46.5 minutes.  The patient spent 24.79% of the night in stage N1 sleep, 45.73% in stage N2 sleep, 0.00% in stage N3 and 29.48% in REM.  Alpha intrusion was absent.  Supine sleep was 22.85%.  RESPIRATORY PARAMETERS The overall apnea/hypopnea index (AHI) was 14.7 per hour. There were 60 total apneas, including 53 obstructive, 7 central and 0 mixed apneas. There were 21 hypopneas and 112 RERAs.  The AHI during Stage REM sleep was 23.4 per hour.  AHI while supine was 25.4 per hour.  The mean oxygen saturation was 97.20%. The minimum SpO2 during sleep was 86.00%.  loud snoring was noted during this study.  CARDIAC DATA The 2 lead EKG demonstrated sinus rhythm. The mean heart rate was 75.03 beats per minute. Other EKG  findings include: None.  LEG MOVEMENT DATA The total PLMS were 2 with a resulting PLMS index of 0.36. Associated arousal with leg movement index was 0.0 .  IMPRESSIONS - Mild to moderate obstructive sleep apnea occurred during this study (AHI = 14.7/h). - No significant central sleep apnea occurred during this study (CAI = 1.3/h). - Mild oxygen desaturation was noted during this study (Min O2 = 86.00%, Mean 97.2%). - The patient snored with loud snoring volume. - No cardiac abnormalities were noted during this study. - Clinically significant periodic limb movements did not occur during sleep. No significant associated arousals.  DIAGNOSIS - Obstructive Sleep Apnea (327.23 [G47.33 ICD-10])  RECOMMENDATIONS - Therapeutic CPAP titration or AutoPAP. Other options would be based on clinical judgment. - Positional therapy avoiding supine position during sleep. - Be careful with alcohol, sedatives and other CNS depressants that may worsen sleep apnea and disrupt normal sleep architecture. - Sleep hygiene should be reviewed to assess factors that may improve sleep quality. - Weight management and regular exercise should be initiated or continued if appropriate.  [Electronically signed] 03/24/2017 10:45 AM  Baird Lyons MD, ABSM Diplomate, American Board of Sleep Medicine   NPI: [id number]                           [place] [place] [facility]  ELECTRONICALLY SIGNED ON:  03/24/2017, 10:42 AM [place] PH: [phone]   FX: [phone] [place]

## 2017-03-25 ENCOUNTER — Other Ambulatory Visit (INDEPENDENT_AMBULATORY_CARE_PROVIDER_SITE_OTHER): Payer: Self-pay | Admitting: Physician Assistant

## 2017-03-25 DIAGNOSIS — G4733 Obstructive sleep apnea (adult) (pediatric): Secondary | ICD-10-CM

## 2017-03-26 ENCOUNTER — Telehealth (INDEPENDENT_AMBULATORY_CARE_PROVIDER_SITE_OTHER): Payer: Self-pay

## 2017-03-26 NOTE — Telephone Encounter (Signed)
-----   Message from Clent Demark, PA-C sent at 03/25/2017 12:01 PM EST ----- Pt found to have OSA. I have made the referral order for a CPAP titration.

## 2017-03-26 NOTE — Telephone Encounter (Signed)
Patient aware of OSA and referral being placed for CPAP titration. [name], CMA

## 2017-04-02 ENCOUNTER — Encounter (HOSPITAL_COMMUNITY): Payer: Self-pay | Admitting: Emergency Medicine

## 2017-04-02 ENCOUNTER — Ambulatory Visit (HOSPITAL_COMMUNITY)
Admission: EM | Admit: 2017-04-02 | Discharge: 2017-04-02 | Disposition: A | Discharge status: Home / Self Care | Payer: Self-pay | Attending: Family Medicine | Admitting: Family Medicine

## 2017-04-02 DIAGNOSIS — N189 Chronic kidney disease, unspecified: Secondary | ICD-10-CM | POA: Insufficient documentation

## 2017-04-02 DIAGNOSIS — N898 Other specified noninflammatory disorders of vagina: Secondary | ICD-10-CM | POA: Insufficient documentation

## 2017-04-02 DIAGNOSIS — F319 Bipolar disorder, unspecified: Secondary | ICD-10-CM | POA: Insufficient documentation

## 2017-04-02 DIAGNOSIS — F17210 Nicotine dependence, cigarettes, uncomplicated: Secondary | ICD-10-CM | POA: Insufficient documentation

## 2017-04-02 DIAGNOSIS — Z91013 Allergy to seafood: Secondary | ICD-10-CM | POA: Insufficient documentation

## 2017-04-02 DIAGNOSIS — Z888 Allergy status to other drugs, medicaments and biological substances status: Secondary | ICD-10-CM | POA: Insufficient documentation

## 2017-04-02 DIAGNOSIS — Z880 Allergy status to penicillin: Secondary | ICD-10-CM | POA: Insufficient documentation

## 2017-04-02 DIAGNOSIS — E669 Obesity, unspecified: Secondary | ICD-10-CM | POA: Insufficient documentation

## 2017-04-02 DIAGNOSIS — R300 Dysuria: Secondary | ICD-10-CM | POA: Insufficient documentation

## 2017-04-02 DIAGNOSIS — R1030 Lower abdominal pain, unspecified: Secondary | ICD-10-CM | POA: Insufficient documentation

## 2017-04-02 DIAGNOSIS — G4733 Obstructive sleep apnea (adult) (pediatric): Secondary | ICD-10-CM | POA: Insufficient documentation

## 2017-04-02 DIAGNOSIS — F419 Anxiety disorder, unspecified: Secondary | ICD-10-CM | POA: Insufficient documentation

## 2017-04-02 DIAGNOSIS — Z882 Allergy status to sulfonamides status: Secondary | ICD-10-CM | POA: Insufficient documentation

## 2017-04-02 DIAGNOSIS — D573 Sickle-cell trait: Secondary | ICD-10-CM | POA: Insufficient documentation

## 2017-04-02 DIAGNOSIS — R112 Nausea with vomiting, unspecified: Secondary | ICD-10-CM | POA: Insufficient documentation

## 2017-04-02 DIAGNOSIS — Z886 Allergy status to analgesic agent status: Secondary | ICD-10-CM | POA: Insufficient documentation

## 2017-04-02 DIAGNOSIS — R1084 Generalized abdominal pain: Secondary | ICD-10-CM

## 2017-04-02 DIAGNOSIS — Z79899 Other long term (current) drug therapy: Secondary | ICD-10-CM | POA: Insufficient documentation

## 2017-04-02 DIAGNOSIS — K219 Gastro-esophageal reflux disease without esophagitis: Secondary | ICD-10-CM | POA: Insufficient documentation

## 2017-04-02 LAB — POCT URINALYSIS DIP (DEVICE)
Glucose, UA: NEGATIVE mg/dL
Ketones, ur: 15 mg/dL — AB
Nitrite: NEGATIVE
Protein, ur: 30 mg/dL — AB
Specific Gravity, Urine: >=1.03 (ref 1.005–1.030)
Urobilinogen, UA: 1 mg/dL (ref 0.0–1.0)
pH: 5.5 (ref 5.0–8.0)

## 2017-04-02 LAB — POCT PREGNANCY, URINE: Preg Test, Ur: NEGATIVE

## 2017-04-02 MED ORDER — CIPROFLOXACIN HCL 500 MG PO TABS: 500.0000 mg | ORAL_TABLET | Freq: Two times a day (BID) | ORAL | 0 refills | Status: AC

## 2017-04-02 MED ORDER — FLUCONAZOLE 150 MG PO TABS: 150.0000 mg | ORAL_TABLET | Freq: Once | ORAL | 0 refills | Status: AC

## 2017-04-02 MED ORDER — METRONIDAZOLE 500 MG PO TABS: 500.0000 mg | ORAL_TABLET | Freq: Two times a day (BID) | ORAL | 0 refills | Status: AC

## 2017-04-02 MED ORDER — ONDANSETRON HCL 4 MG PO TABS: 4.0000 mg | ORAL_TABLET | Freq: Three times a day (TID) | ORAL | 0 refills | Status: AC | PRN

## 2017-04-02 NOTE — ED Provider Notes (Signed)
[facility]    CSN: [id number] Arrival date & time: 04/02/17  1529     History   Chief Complaint Chief Complaint  Patient presents with  . Abdominal Pain    HPI [name] is a 47 y.o. female history of bipolar, GERD.  Presenting with right-sided flank pain and lower abdominal pain.  She has felt some pelvic pressure.  Does endorse urinary symptoms of dysuria, and incomplete voiding.  Denies frequency and urgency.  Also having some vaginal itching and irritation, no increase in discharge.  Has a history of BV.  Has had some nausea and vomiting and feeling weak.  Does have been going on for 1 week.  Last menstrual period was January 4.  She is not on any birth control.  Patient states that she has had abdominal pain and nausea and vomiting that has been going on for a while now.  She has been evaluated with a CT abdomen, lipase,, CMP that have all been negative.  HPI  Past Medical History:  Diagnosis Date  . Anxiety   . Blood transfusion    1989 at [facility]  . Bronchitis   . Chronic bipolar disorder ([place])   . Chronic kidney disease   . Depression    bipolar  . GERD (gastroesophageal reflux disease)   . Headache(784.0)    occasional  . Mental disorder    bipolar  . Pyelonephritis 10/2010  . Recurrent upper respiratory infection (URI)    states she has not been to MD and feels like she has  bronchitis now- greenish sputum  . Shortness of breath    sometimes with exertion  . Sickle cell trait ([place])   . Sleep apnea    CPAP  . UTI (lower urinary tract infection) 10/2010    Patient Active Problem List   Diagnosis Date Noted  . Fracture of tibial plateau, closed 03/08/2011  . GERD (gastroesophageal reflux disease) 03/08/2011  . Bipolar disorder ([place]) 03/08/2011  . OSA (obstructive sleep apnea) 03/08/2011  . Obesity 03/08/2011  . Nicotine dependence 03/08/2011    Past Surgical History:  Procedure Laterality Date  . CESAREAN SECTION    . CESAREAN  SECTION    . KNEE ARTHROSCOPY  06/15/2011   Procedure: ARTHROSCOPY KNEE;  Surgeon: Rozanna Box, MD;  Location: [facility];  Service: Orthopedics;  Laterality: Left;  LEFT KNEE SCOPE WITH LYSIS OF ADHESIONS AND MANIPULATION  . ORIF TIBIA PLATEAU  03/08/2011   Procedure: OPEN REDUCTION INTERNAL FIXATION (ORIF) TIBIAL PLATEAU;  Surgeon: Rozanna Box;  Location: [facility];  Service: Orthopedics;  Laterality: Left;  . TUBAL LIGATION      OB History    No data available       Home Medications    Prior to Admission medications   Medication Sig Start Date End Date Taking? Authorizing Provider  esomeprazole (NEXIUM) 40 MG capsule Take 1 capsule (40 mg total) by mouth daily. 11/06/16  Yes Clent Demark, PA-C  ciprofloxacin (CIPRO) 500 MG tablet Take 1 tablet (500 mg total) by mouth every 12 (twelve) hours for 7 days. 04/02/17 04/09/17  Wieters, Hallie C, PA-C  fluconazole (DIFLUCAN) 150 MG tablet Take 1 tablet (150 mg total) by mouth once for 1 dose. 04/02/17 04/02/17  Wieters, Hallie C, PA-C  metroNIDAZOLE (FLAGYL) 500 MG tablet Take 1 tablet (500 mg total) by mouth 2 (two) times daily for 7 days. 04/02/17 04/09/17  Wieters, Hallie C, PA-C  ondansetron (ZOFRAN) 4 MG tablet  Take 1 tablet (4 mg total) by mouth every 8 (eight) hours as needed for up to 4 days for nausea or vomiting. 04/02/17 04/06/17  Wieters, Hallie C, PA-C  prazosin (MINIPRESS) 2 MG capsule Take 2 mg by mouth at bedtime.    [provider]    Family History Family History  Problem Relation Age of Onset  . Thyroid disease Father   . Diabetes Other   . Cancer Other   . Anesthesia problems Neg Hx     Social History Social History   Tobacco Use  . Smoking status: Current Every Day Smoker    Packs/day: 0.00    Years: 24.00    Pack years: 0.00    Types: Cigarettes  . Smokeless tobacco: Never Used  Substance Use Topics  . Alcohol use: Yes    Comment: ocassion social  . Drug use: Yes    Types: "Crack" cocaine    Comment:  none since 2000     Allergies   Aspirin; Banana; Latex; Penicillins; Septra [bactrim]; Shellfish allergy; Strawberry extract; and Tape   Review of Systems Review of Systems  Constitutional: Negative for fever.  Respiratory: Negative for shortness of breath.   Cardiovascular: Negative for chest pain.  Gastrointestinal: Positive for abdominal pain and nausea. Negative for diarrhea and vomiting.  Genitourinary: Positive for dysuria and flank pain. Negative for genital sores, hematuria, menstrual problem, vaginal bleeding, vaginal discharge and vaginal pain.       Vaginal itching, incomplete voiding  Musculoskeletal: Negative for back pain.  Skin: Negative for rash.  Neurological: Negative for dizziness, light-headedness and headaches.     Physical Exam Triage Vital Signs ED Triage Vitals [04/02/17 1616]  Enc Vitals Group     BP 138/83     Pulse Rate 81     Resp 16     Temp 98.4 F (36.9 C)     Temp Source Oral     SpO2 100 %     Weight 240 lb (108.9 kg)     Height      Head Circumference      Peak Flow      Pain Score 8     Pain Loc      Pain Edu?      Excl. in [place]?    No data found.  Updated Vital Signs BP 138/83   Pulse 81   Temp 98.4 F (36.9 C) (Oral)   Resp 16   Wt 240 lb (108.9 kg)   LMP 03/08/2017   SpO2 100%   BMI 37.59 kg/m   Visual Acuity Right Eye Distance:   Left Eye Distance:   Bilateral Distance:    Right Eye Near:   Left Eye Near:    Bilateral Near:     Physical Exam  Constitutional: She appears well-developed and well-nourished. No distress.  HENT:  Head: Normocephalic and atraumatic.  Eyes: Conjunctivae are normal.  Neck: Neck supple.  Cardiovascular: Normal rate and regular rhythm.  No murmur heard. Pulmonary/Chest: Effort normal and breath sounds normal. No respiratory distress.  Abdominal: Soft. Bowel sounds are normal. There is generalized tenderness. There is CVA tenderness. There is negative Murphy's sign.  Tenderness to  light and deep palpation throughout abdomen, negative Murphy sign negative rebound, no guarding abdomen is soft.  Negative McBurney's point.  Positive CVA tenderness.  Genitourinary:  Genitourinary Comments: Deferred by patient states she recently had a pelvic exam and all was normal.  And states that this abdominal pain is not  significantly worse than it has been over the past few months.  Musculoskeletal: She exhibits no edema.  Neurological: She is alert.  Skin: Skin is warm and dry.  Psychiatric: She has a normal mood and affect.  Nursing note and vitals reviewed.    UC Treatments / Results  Labs (all labs ordered are listed, but only abnormal results are displayed) Labs Reviewed  POCT URINALYSIS DIP (DEVICE) - Abnormal; Notable for the following components:      Result Value   Bilirubin Urine MODERATE (*)    Ketones, ur 15 (*)    Hgb urine dipstick TRACE (*)    Protein, ur 30 (*)    Leukocytes, UA SMALL (*)    All other components within normal limits  URINE CULTURE  POCT PREGNANCY, URINE  CERVICOVAGINAL ANCILLARY ONLY    EKG  EKG Interpretation None       Radiology No results found.  Procedures Procedures (including critical care time)  Medications Ordered in UC Medications - No data to display   Initial Impression / Assessment and Plan / UC Course  I have reviewed the triage vital signs and the nursing notes.  Pertinent labs & imaging results that were available during my care of the patient were reviewed by me and considered in my medical decision making (see chart for details).      UA showing small leukocytes. Will treat for UTI with Cipro, since patient also with nausea vomiting positive CVA tenderness. Urine culture ordered, will call if need for any change in treatment.  We will go ahead and treat for yeast and BV with Diflucan and metronidazole.  Zofran provided for nausea.  Abdominal pain seems to be similar to what she has been experienced and  evaluated for in the past.  Advised that if her abdominal pain changes at all, worsens, develops fever that she should go to the emergency room. Discussed strict return precautions. Patient verbalized understanding and is agreeable with plan.    Final Clinical Impressions(s) / UC Diagnoses   Final diagnoses:  Dysuria  Generalized abdominal pain  Vaginal itching    ED Discharge Orders        Ordered    ciprofloxacin (CIPRO) 500 MG tablet  Every 12 hours     04/02/17 1719    fluconazole (DIFLUCAN) 150 MG tablet   Once     04/02/17 1719    metroNIDAZOLE (FLAGYL) 500 MG tablet  2 times daily     04/02/17 1719    ondansetron (ZOFRAN) 4 MG tablet  Every 8 hours PRN     04/02/17 1719       Controlled Substance Prescriptions [place] Controlled Substance Registry consulted? Not Applicable   Janith Lima, [place] 04/02/17 2100

## 2017-04-02 NOTE — ED Triage Notes (Signed)
PT reports bilateral side pain, pelvic pressure, vaginal pain, urinary symptoms for 1 week.   Denies dysuria.

## 2017-04-02 NOTE — Discharge Instructions (Signed)
Urine showed evidence of infection. We are treating you with cipro- twice daily for 7 days. Be sure to take full course. Stay hydrated- urine should be pale yellow to clear. My continue azo for relief of burning while infection is being cleared.   Please return or follow up with your primary provider if symptoms not improving with treatment. Please return sooner if you have worsening of symptoms or develop fever, nausea, vomiting, abdominal pain, back pain, lightheadedness, dizziness.  We will go ahead and treat you for BV and yeast. Please refrain from sexual intercourse for 7 days while medicines eliminating infection. Avoid alcohol while taking metronidazole.   We are testing you for Gonorrhea, Chlamydia, Trichomonas, Yeast and Bacterial Vaginosis. We will call you if anything is positive and let you know if you require any further treatment. Please inform partners of any positive results.   Please return if symptoms not improving with treatment, development of fever, nausea, vomiting, abdominal pain.   Please follow up with Dr. Altamease Oiler for continued evaluation of this abdominal pain. If pain significantly worsens please go to emergency room.

## 2017-04-03 LAB — CERVICOVAGINAL ANCILLARY ONLY
Bacterial vaginitis: NEGATIVE
Candida vaginitis: NEGATIVE
Chlamydia: NEGATIVE
Neisseria Gonorrhea: NEGATIVE
Trichomonas: POSITIVE — AB

## 2017-04-03 MED FILL — metroNIDAZOLE 500 MG TABS: 500 | 7 days supply | Qty: 14 | Fill #0

## 2017-04-03 MED FILL — ONDANSETRON HCL 4 MG TABLET: 4 | 5 days supply | Qty: 16 | Fill #0

## 2017-04-03 MED FILL — CIPROFLOXACIN HCL 500 MG TA: 500 | 7 days supply | Qty: 14 | Fill #0

## 2017-04-03 MED FILL — FLUCONAZOLE 150 MG TABLET: 150 | 1 days supply | Qty: 1 | Fill #0

## 2017-04-04 LAB — URINE CULTURE

## 2017-04-18 ENCOUNTER — Ambulatory Visit (HOSPITAL_BASED_OUTPATIENT_CLINIC_OR_DEPARTMENT_OTHER): Payer: Self-pay | Attending: Physician Assistant | Admitting: Internal Medicine

## 2017-04-18 VITALS — Ht 67.5 in | Wt 235.0 lb

## 2017-04-18 DIAGNOSIS — G4733 Obstructive sleep apnea (adult) (pediatric): Secondary | ICD-10-CM | POA: Insufficient documentation

## 2017-04-21 DIAGNOSIS — G4733 Obstructive sleep apnea (adult) (pediatric): Secondary | ICD-10-CM

## 2017-04-21 NOTE — Procedures (Signed)
Patient Name: [name], [place] Date: 04/18/2017 Gender: Female D.O.B: [birth year] Age (years): 47 Referring Provider: Clent Demark PA Height (inches): 68 Interpreting Physician: Baird Lyons MD, ABSM Weight (lbs): 235 RPSGT: Baxter Flattery BMI: 67 MRN: [id number] Neck Size: 18.00  CLINICAL INFORMATION The patient is referred for a CPAP titration to treat sleep apnea.  Date of NPSG, Split Night or HST: 03/15/17  NPSG   AHI 14.7/ hr, desaturation to 86%, body weight 220 lbs  SLEEP STUDY TECHNIQUE As per the AASM Manual for the Scoring of Sleep and Associated Events v2.3 (April 2016) with a hypopnea requiring 4% desaturations.  The channels recorded and monitored were frontal, central and occipital EEG, electrooculogram (EOG), submentalis EMG (chin), nasal and oral airflow, thoracic and abdominal wall motion, anterior tibialis EMG, snore microphone, electrocardiogram, and pulse oximetry. Continuous positive airway pressure (CPAP) was initiated at the beginning of the study and titrated to treat sleep-disordered breathing.  MEDICATIONS Medications self-administered by patient taken the night of the study : none reported  TECHNICIAN COMMENTS Comments added by technician: RESMED AIR-TOUCH F20 MEDIUM MASK WAS USED FOR THIS STUDY Comments added by scorer: N/A  RESPIRATORY PARAMETERS Optimal PAP Pressure (cm): 10 AHI at Optimal Pressure (/hr): 0.0 Overall Minimal O2 (%): 90.00 Supine % at Optimal Pressure (%): 11 Minimal O2 at Optimal Pressure (%): 92.0   SLEEP ARCHITECTURE The study was initiated at 11:08:04 PM and ended at 5:10:43 AM.  Sleep onset time was 37.4 minutes and the sleep efficiency was 70.6%. The total sleep time was 256.0 minutes.  The patient spent 4.88% of the night in stage N1 sleep, 63.67% in stage N2 sleep, 0.00% in stage N3 and 31.45% in REM.Stage REM latency was 73.5 minutes  Wake after sleep onset was 69.2. Alpha intrusion was absent. Supine sleep  was 2.73%.  CARDIAC DATA The 2 lead EKG demonstrated sinus rhythm. The mean heart rate was 78.81 beats per minute. Other EKG findings include: None.  LEG MOVEMENT DATA The total Periodic Limb Movements of Sleep (PLMS) were 0. The PLMS index was 0.00. A PLMS index of <15 is considered normal in adults.  IMPRESSIONS - The optimal PAP pressure was 10 cm of water. - Central sleep apnea was not noted during this titration (CAI = 0.2/h). - Mild oxygen desaturations were observed during this titration (min O2 = 90.00%). - No snoring was audible during this study. - No cardiac abnormalities were observed during this study. - Clinically significant periodic limb movements were not noted during this study. Arousals associated with PLMs were rare.  DIAGNOSIS - Obstructive Sleep Apnea (327.23 [G47.33 ICD-10])  RECOMMENDATIONS - Trial of CPAP therapy on 10 cm H2O with a Medium size Resmed Full Face Mask AirFit F20 mask and heated humidification. - Be careful with alcohol, sedatives and other CNS depressants that may worsen sleep apnea and disrupt normal sleep architecture. - Sleep hygiene should be reviewed to assess factors that may improve sleep quality. - Weight management and regular exercise should be initiated or continued.  [Electronically signed] 04/21/2017 11:28 AM  Baird Lyons MD, ABSM Diplomate, American Board of Sleep Medicine   NPI: [id number]                         [place], [place] [facility]  ELECTRONICALLY SIGNED ON:  04/21/2017, 11:26 AM [place] PH: [phone]   FX: [phone] [place]  MEDICINE

## 2017-04-29 ENCOUNTER — Telehealth (INDEPENDENT_AMBULATORY_CARE_PROVIDER_SITE_OTHER): Payer: Self-pay | Admitting: *Deleted

## 2017-04-29 NOTE — Telephone Encounter (Signed)
Medical Assistant left message on patient's home and cell voicemail. Voicemail states to give a call back to [place] with [facility] at [phone]. !!!Please ask the patient if her CPAP was calibrated by the sleep center. If not she needs to contact them and set of an appointment to calibrate her machine!!!

## 2017-04-29 NOTE — Telephone Encounter (Signed)
-----   Message from Clent Demark, PA-C sent at 04/22/2017  5:25 PM EST ----- Please ask if they helped her calibrate her CPAP to the right settings. If not, have her call the sleep center and ask for their help with calibrating her machine.

## 2017-04-30 ENCOUNTER — Telehealth (INDEPENDENT_AMBULATORY_CARE_PROVIDER_SITE_OTHER): Payer: Self-pay | Admitting: Physician Assistant

## 2017-04-30 NOTE — Telephone Encounter (Signed)
Pt returned the nurse called, they issue is that the Pt does not have the CPAP machine, please call the Pt back

## 2017-04-30 NOTE — Telephone Encounter (Signed)
Patient was positive for OSA, Patient now needs CPAP titration to be completed so she can receive a machine based on her need. Please place Titration order.

## 2017-05-01 ENCOUNTER — Other Ambulatory Visit (INDEPENDENT_AMBULATORY_CARE_PROVIDER_SITE_OTHER): Payer: Self-pay | Admitting: Physician Assistant

## 2017-05-01 DIAGNOSIS — G4733 Obstructive sleep apnea (adult) (pediatric): Secondary | ICD-10-CM

## 2017-05-01 NOTE — Telephone Encounter (Signed)
Order has been placed. Thank you 

## 2017-05-07 ENCOUNTER — Telehealth (INDEPENDENT_AMBULATORY_CARE_PROVIDER_SITE_OTHER): Payer: Self-pay

## 2017-05-07 NOTE — Telephone Encounter (Signed)
Patient does not have insurance and will need to have CPAP ordered through the CPAP association. A form can be completed with the details of the study, but will need to be signed by PCP. Patient will call number listed at the top of the form, once it has been faxed to be processed, and patient can pay her $20 portion of the $100 dollars. [place] will pay the additional $80. She will then be called once it has been delivered to the office to set up teaching at [facility].

## 2017-05-07 NOTE — Telephone Encounter (Signed)
Patient returned call and stated she has not received a CPAP machine. She was told by the sleep center that one would come in 1-4 weeks, but was not provided any additional details. Informed patient that when she does receive the CPAP to call the sleep center and schedule and appointment for them to assist in calibrating the machine. Nat Christen, CMA

## 2017-05-29 ENCOUNTER — Ambulatory Visit (INDEPENDENT_AMBULATORY_CARE_PROVIDER_SITE_OTHER): Payer: Self-pay | Admitting: Physician Assistant

## 2017-07-04 ENCOUNTER — Telehealth (INDEPENDENT_AMBULATORY_CARE_PROVIDER_SITE_OTHER): Payer: Self-pay | Admitting: Physician Assistant

## 2017-07-04 NOTE — Telephone Encounter (Signed)
Pt called and wanted further explanation on a CPAP machine form, Nurse was in a room with a ptplease follow up

## 2017-07-10 ENCOUNTER — Ambulatory Visit (INDEPENDENT_AMBULATORY_CARE_PROVIDER_SITE_OTHER): Payer: Self-pay | Admitting: Nurse Practitioner

## 2017-08-06 ENCOUNTER — Other Ambulatory Visit: Payer: Self-pay

## 2017-08-06 ENCOUNTER — Emergency Department (HOSPITAL_COMMUNITY)
Admission: EM | Admit: 2017-08-06 | Discharge: 2017-08-06 | Disposition: A | Discharge status: Home / Self Care | Payer: Self-pay | Attending: Emergency Medicine | Admitting: Emergency Medicine

## 2017-08-06 ENCOUNTER — Encounter (HOSPITAL_COMMUNITY): Payer: Self-pay

## 2017-08-06 ENCOUNTER — Emergency Department (HOSPITAL_COMMUNITY): Payer: Self-pay

## 2017-08-06 DIAGNOSIS — R0600 Dyspnea, unspecified: Secondary | ICD-10-CM

## 2017-08-06 DIAGNOSIS — F17210 Nicotine dependence, cigarettes, uncomplicated: Secondary | ICD-10-CM | POA: Insufficient documentation

## 2017-08-06 DIAGNOSIS — N189 Chronic kidney disease, unspecified: Secondary | ICD-10-CM | POA: Insufficient documentation

## 2017-08-06 DIAGNOSIS — R0609 Other forms of dyspnea: Secondary | ICD-10-CM | POA: Insufficient documentation

## 2017-08-06 DIAGNOSIS — Z79899 Other long term (current) drug therapy: Secondary | ICD-10-CM | POA: Insufficient documentation

## 2017-08-06 DIAGNOSIS — R079 Chest pain, unspecified: Secondary | ICD-10-CM | POA: Insufficient documentation

## 2017-08-06 LAB — CBC WITH DIFFERENTIAL/PLATELET
Abs Immature Granulocytes: 0 10*3/uL (ref 0.0–0.1)
Basophils Absolute: 0.1 10*3/uL (ref 0.0–0.1)
Basophils Relative: 1 %
Eosinophils Absolute: 0.1 10*3/uL (ref 0.0–0.7)
Eosinophils Relative: 1 %
HCT: 35.5 % — ABNORMAL LOW (ref 36.0–46.0)
Hemoglobin: 11 g/dL — ABNORMAL LOW (ref 12.0–15.0)
Immature Granulocytes: 0 %
Lymphocytes Relative: 49 %
Lymphs Abs: 3.3 10*3/uL (ref 0.7–4.0)
MCH: 25.1 pg — ABNORMAL LOW (ref 26.0–34.0)
MCHC: 31 g/dL (ref 30.0–36.0)
MCV: 81.1 fL (ref 78.0–100.0)
Monocytes Absolute: 0.6 10*3/uL (ref 0.1–1.0)
Monocytes Relative: 9 %
Neutro Abs: 2.7 10*3/uL (ref 1.7–7.7)
Neutrophils Relative %: 40 %
Platelets: 393 10*3/uL (ref 150–400)
RBC: 4.38 MIL/uL (ref 3.87–5.11)
RDW: 18.9 % — ABNORMAL HIGH (ref 11.5–15.5)
WBC: 6.8 10*3/uL (ref 4.0–10.5)

## 2017-08-06 LAB — COMPREHENSIVE METABOLIC PANEL
ALT: 50 U/L (ref 14–54)
AST: 49 U/L — ABNORMAL HIGH (ref 15–41)
Albumin: 3.6 g/dL (ref 3.5–5.0)
Alkaline Phosphatase: 105 U/L (ref 38–126)
Anion gap: 8 (ref 5–15)
BUN: 6 mg/dL (ref 6–20)
CO2: 26 mmol/L (ref 22–32)
Calcium: 8.9 mg/dL (ref 8.9–10.3)
Chloride: 106 mmol/L (ref 101–111)
Creatinine, Ser: 0.81 mg/dL (ref 0.44–1.00)
GFR calc Af Amer: >60 mL/min (ref >60)
GFR calc non Af Amer: >60 mL/min (ref >60)
Glucose, Bld: 89 mg/dL (ref 65–99)
Potassium: 3.7 mmol/L (ref 3.5–5.1)
Sodium: 140 mmol/L (ref 135–145)
Total Bilirubin: 0.5 mg/dL (ref 0.3–1.2)
Total Protein: 7.1 g/dL (ref 6.5–8.1)

## 2017-08-06 LAB — TROPONIN I: Troponin I: <0.03 ng/mL (ref <0.03)

## 2017-08-06 LAB — D-DIMER, QUANTITATIVE (NOT AT ARMC): D-Dimer, Quant: 0.46 ug/mL-FEU (ref 0.00–0.50)

## 2017-08-06 LAB — SEDIMENTATION RATE: Sed Rate: 8 mm/hr (ref 0–22)

## 2017-08-06 LAB — C-REACTIVE PROTEIN: CRP: <0.8 mg/dL (ref <1.0)

## 2017-08-06 MED ORDER — ASPIRIN EC 325 MG PO TBEC: 325.0000 mg | DELAYED_RELEASE_TABLET | Freq: Every day | ORAL | 0 refills | Status: DC

## 2017-08-06 MED ORDER — ACETAMINOPHEN 500 MG PO TABS
1000.0000 mg | ORAL_TABLET | Freq: Once | ORAL | Status: AC
  Administered 2017-08-06: 1000 mg via ORAL
  Filled 2017-08-06: qty 2

## 2017-08-06 NOTE — ED Notes (Signed)
Pt ambulatory with pulse ox; 95% with ambulation on room air; pt denies SOB or feelings of lightheadedness.

## 2017-08-06 NOTE — ED Triage Notes (Signed)
Per ems pt at health [facility] and c/o SOB and dizziness and headache. Bp 177/108 No hx HTN.Marland Kitchen Has had SOB with exertion X1 month

## 2017-08-06 NOTE — ED Notes (Signed)
Patient transported to X-ray 

## 2017-08-06 NOTE — ED Provider Notes (Signed)
[facility] EMERGENCY DEPARTMENT Provider Note   CSN: [id number] Arrival date & time: 08/06/17  1619     History   Chief Complaint Chief Complaint  Patient presents with  . Shortness of Breath    HPI [name] is a 47 y.o. female with a h/o of OSA who presents to the emergency department from the health department with a chief complaint of elevated blood pressure.  Patient reports that she was being seen at the health department at the [facility] clinic for a follow-up appointment.  EMS was called after her blood pressure was checked and was noted to be in the 200s over 100s.  She had no associated symptoms at the time that her blood pressure was checked.  The patient reports intermittent dyspnea on exertion with associated "pressure-like" chest pain, palpitations, and dizziness.  The patient reports progressively worsening room to room dyspnea over the last month.  She reports that when she stands up that she will begin to have dizziness and heart palpitations and becomes extremely short of breath walking from room to room.  Her symptoms resolve with rest.  The patient's daughter reports that last week the patient walked from the parking lot into the grocery store she became very short of breath and had one episode of emesis in the grocery store and had to go home.   She also reports intermittent, progressively worsening headaches located over the left temporal area and bilateral forehead.  Reports that her headaches will typically last for 1 to 2 days. she reports associated photophobia, blurred vision, and "seeing yellow in my vision".  She states that she will treat her headaches by taking approximately 3 BC  powders in a day.  States she will typically take this about 3 times a week and has for headaches "for years".  She states that she is also been noticing an intermittent rash with circular, red, hard nodules that will come and go.  She reports that she is still  having months monthly menstrual cycles.  Her menstrual cycle will last approximately 3 days, but she will change her pad every couple of hours and typically use 5-6 pads per day.  Review of her medical record indicates that she received a blood transfusion in 1989.   No family history of cardiovascular disease.  She has a history of bipolar disorder and depression, but is not taking her home medications.  She was diagnosed with OSA earlier this year, but has been unable to get a CPAP machine because she does not currently have insurance.  No history of hypertension.  She is a current everyday smoker.  The history is provided by the patient and a relative. No language interpreter was used.  Shortness of Breath  Associated symptoms include headaches, chest pain and vomiting. Pertinent negatives include no fever, no sore throat, no neck pain, no wheezing, no abdominal pain and no rash.    Past Medical History:  Diagnosis Date  . Anxiety   . Blood transfusion    1989 at [facility]  . Bronchitis   . Chronic bipolar disorder ([place])   . Chronic kidney disease   . Depression    bipolar  . GERD (gastroesophageal reflux disease)   . Headache(784.0)    occasional  . Mental disorder    bipolar  . Pyelonephritis 10/2010  . Recurrent upper respiratory infection (URI)    states she has not been to MD and feels like she has  bronchitis now- greenish  sputum  . Shortness of breath    sometimes with exertion  . Sickle cell trait ([place])   . Sleep apnea    CPAP  . UTI (lower urinary tract infection) 10/2010    Patient Active Problem List   Diagnosis Date Noted  . Fracture of tibial plateau, closed 03/08/2011  . GERD (gastroesophageal reflux disease) 03/08/2011  . Bipolar disorder ([place]) 03/08/2011  . OSA (obstructive sleep apnea) 03/08/2011  . Obesity 03/08/2011  . Nicotine dependence 03/08/2011    Past Surgical History:  Procedure Laterality Date  . CESAREAN SECTION    . CESAREAN SECTION      . KNEE ARTHROSCOPY  06/15/2011   Procedure: ARTHROSCOPY KNEE;  Surgeon: Rozanna Box, MD;  Location: [facility];  Service: Orthopedics;  Laterality: Left;  LEFT KNEE SCOPE WITH LYSIS OF ADHESIONS AND MANIPULATION  . ORIF TIBIA PLATEAU  03/08/2011   Procedure: OPEN REDUCTION INTERNAL FIXATION (ORIF) TIBIAL PLATEAU;  Surgeon: Rozanna Box;  Location: [facility];  Service: Orthopedics;  Laterality: Left;  . TUBAL LIGATION       OB History   None      Home Medications    Prior to Admission medications   Medication Sig Start Date End Date Taking? Authorizing Provider  esomeprazole (NEXIUM) 40 MG capsule Take 1 capsule (40 mg total) by mouth daily. 11/06/16  Yes Clent Demark, PA-C  aspirin EC 325 MG tablet Take 1 tablet (325 mg total) by mouth daily. 08/06/17   Wyona Neils A, PA-C    Family History Family History  Problem Relation Age of Onset  . Thyroid disease Father   . Diabetes Other   . Cancer Other   . Anesthesia problems Neg Hx     Social History Social History   Tobacco Use  . Smoking status: Current Every Day Smoker    Packs/day: 0.00    Years: 24.00    Pack years: 0.00    Types: Cigarettes  . Smokeless tobacco: Never Used  Substance Use Topics  . Alcohol use: Yes    Comment: ocassion social  . Drug use: Yes    Types: "Crack" cocaine    Comment: none since 2000     Allergies   Aspirin; Banana; Latex; Penicillins; Septra [bactrim]; Shellfish allergy; Strawberry extract; and Tape   Review of Systems Review of Systems  Constitutional: Negative for activity change, chills and fever.  HENT: Negative for congestion and sore throat.   Eyes: Positive for photophobia and visual disturbance.  Respiratory: Positive for shortness of breath. Negative for wheezing.   Cardiovascular: Positive for chest pain and palpitations.  Gastrointestinal: Positive for nausea and vomiting. Negative for abdominal pain, blood in stool, constipation and diarrhea.  Endocrine: Negative  for polyuria.  Genitourinary: Negative for dysuria and frequency.  Musculoskeletal: Negative for back pain, neck pain and neck stiffness.  Skin: Negative for color change, pallor and rash.  Allergic/Immunologic: Negative for immunocompromised state.  Neurological: Positive for dizziness, light-headedness and headaches. Negative for weakness and numbness.  Psychiatric/Behavioral: Negative for confusion.     Physical Exam Updated Vital Signs BP (!) 173/93 (BP Location: Left Arm)   Pulse 77   Temp 98.6 F (37 C) (Oral)   Resp (!) 22   SpO2 100%   Physical Exam  Constitutional: She is oriented to person, place, and time.  Non-toxic appearance. She does not appear ill. No distress.  HENT:  Head: Normocephalic and atraumatic.  Right Ear: External ear normal.  Left Ear: External  ear normal.  Nose: Nose normal.  Mouth/Throat: Oropharynx is clear and moist.  TTP over the left temporal artery. No tenderness over the right temporal artery.   Eyes: Pupils are equal, round, and reactive to light. Conjunctivae and EOM are normal. Right eye exhibits no discharge. Left eye exhibits no discharge. No scleral icterus.  Neck: Normal range of motion. Neck supple.  Cardiovascular: Normal rate, regular rhythm, normal heart sounds and intact distal pulses. Exam reveals no gallop and no friction rub.  No murmur heard. Pulmonary/Chest: Effort normal and breath sounds normal. No stridor. No respiratory distress. She has no decreased breath sounds. She has no wheezes. She has no rhonchi. She has no rales. She exhibits no tenderness.  Abdominal: Soft. Bowel sounds are normal. She exhibits no distension and no mass. There is no tenderness. There is no rebound and no guarding. No hernia.  Musculoskeletal: Normal range of motion. She exhibits no edema or tenderness.  Neurological: She is alert and oriented to person, place, and time. No cranial nerve deficit or sensory deficit. She exhibits normal muscle tone.  Coordination normal.  Skin: Skin is warm. Capillary refill takes less than 2 seconds. No rash noted. She is not diaphoretic.  Psychiatric: Her behavior is normal.  Nursing note and vitals reviewed.  ED Treatments / Results  Labs (all labs ordered are listed, but only abnormal results are displayed) Labs Reviewed  CBC WITH DIFFERENTIAL/PLATELET - Abnormal; Notable for the following components:      Result Value   Hemoglobin 11.0 (*)    HCT 35.5 (*)    MCH 25.1 (*)    RDW 18.9 (*)    All other components within normal limits  COMPREHENSIVE METABOLIC PANEL - Abnormal; Notable for the following components:   AST 49 (*)    All other components within normal limits  SEDIMENTATION RATE  D-DIMER, QUANTITATIVE (NOT AT [facility])  TROPONIN I  C-REACTIVE PROTEIN    EKG None  Radiology Dg Chest 2 View  Result Date: 08/06/2017 CLINICAL DATA:  Pt having SOB and palpitations, smokes 1 1/2 ppd EXAM: CHEST - 2 VIEW COMPARISON:  Chest x-ray dated 04/19/2013. FINDINGS: The heart size and mediastinal contours are within normal limits. Both lungs are clear. The visualized skeletal structures are unremarkable. IMPRESSION: No active cardiopulmonary disease. No evidence of pneumonia or pulmonary edema. Electronically Signed   By: Franki Cabot M.D.   On: 08/06/2017 17:07    Procedures Procedures (including critical care time)  Medications Ordered in ED Medications  acetaminophen (TYLENOL) tablet 1,000 mg (1,000 mg Oral Given 08/06/17 1959)     Initial Impression / Assessment and Plan / ED Course  I have reviewed the triage vital signs and the nursing notes.  Pertinent labs & imaging results that were available during my care of the patient were reviewed by me and considered in my medical decision making (see chart for details).     73-year female with a history of OSA presenting with progressively worsening dyspnea on exertion, pressure-like chest pain, palpitations, and dizziness. symptoms have  been worsening over the last month.  She went for a visit today with the STD clinic at the health department and was advised to come to the emergency department due to her elevated blood pressure.  She does not currently take any antihypertensive medications this previously had well-controlled blood pressure.  She also reports HA, photophobia, intermittent blurred vision.  On exam, she is tender to palpation over the left temporal artery.  ESR and  CRP ordered to assess for possible temporal arteritis.  She also mention an intermittent rash on the lower extremities with red, hard nodules.  No rash noted on exam today, but this could be concerning for erythema does some.  I recommended that she follow-up with her primary care provider regarding the symptoms.  Headache improved after Tylenol was given in the ED.   Given her chest pain and dyspnea on exertion that resolved with rest. I am concerned for unstable angina.  EKG with normal sinus rhythm.  Troponin is negative.  Patient was ambulated on pulse ox through the unit and maintained an SaO2 of 95%.  HEART score 5.  Shared decision making conversation.  I recommended admission for chest pain rule out given her heart score.  She declined admission and recommended scheduling an outpatient appointment with cardiology.  I have given her strict return precautions to the emergency department.  She remains chest pain-free with headache that has resolved since arrival.  She is hemodynamically stable.  She will follow-up with her PCP to have her blood pressure rechecked later this week.  She is safe for discharge to home with outpatient follow-up.   Final Clinical Impressions(s) / ED Diagnoses   Final diagnoses:  Dyspnea on exertion  Chest pain, unspecified type    ED Discharge Orders        Ordered    aspirin EC 325 MG tablet  Daily     08/06/17 2010       Aaisha Sliter, Laymond Purser, PA-C 08/06/17 2342    Davonna Belling, MD 08/07/17 2159

## 2017-08-06 NOTE — Discharge Instructions (Addendum)
Thank you for allowing me to provide your care today in the emergency department.  Please call cardiology tomorrow morning to schedule follow-up appointment. Take one tablet of 325 mg of aspirin daily until you can be seen by cardiology.   You can follow up with Dr. Altamease Oiler regarding your headaches.  Take at 1000 mgs of Tylenol every 8 hours for pain or headache. He can follow up with you on the blood work that is pending, which is a test called ESR.  He can also recheck your blood pressure.  Return to the emergency department if you develop shortness of breath or chest pain that does not improve when you sit down and rest, if you pass out, or if you develop other new or worsening symptoms.

## 2017-08-06 NOTE — ED Notes (Signed)
Pt ambulatory from EMS stretcher to ED stretcher with no assistance

## 2017-08-15 ENCOUNTER — Ambulatory Visit: Payer: Self-pay | Admitting: Cardiology

## 2017-09-03 ENCOUNTER — Telehealth: Payer: Self-pay | Admitting: Physician Assistant

## 2017-09-03 NOTE — Telephone Encounter (Signed)
Patient's Cpap application was faxed to the [employer] #[phone].

## 2017-09-06 ENCOUNTER — Telehealth: Payer: Self-pay | Admitting: Physician Assistant

## 2017-09-06 NOTE — Telephone Encounter (Signed)
Patient returned my call. Explained to her that her Cpap application was faxed to the [employer] and informed her that [facility] will be paying the $100 fee for machine. Also, informed her that she no longer had to worry about paying the $20 fee.  Patient was very appreciative since she couldn't afford to make that payment.   Explained to patient that it might take a month or two before we receive the machine in the office and that once we did, will would be reaching out to her again to schedule an appointment to meet with respiratory therapist at [facility] and to pick up machine. Patient understood. No further questions.

## 2017-09-06 NOTE — Telephone Encounter (Signed)
Call placed [phone] to patient regarding to her CPAP application. No answer. Left message asking her to return my call at [phone]. Patient can also speak with Lowell Guitar., case manager.

## 2017-09-24 ENCOUNTER — Ambulatory Visit (INDEPENDENT_AMBULATORY_CARE_PROVIDER_SITE_OTHER): Payer: Self-pay | Admitting: Physician Assistant

## 2017-10-02 ENCOUNTER — Telehealth: Payer: Self-pay

## 2017-10-02 NOTE — Telephone Encounter (Signed)
Attempted to contact [employer]  - CPAP assistance program # [phone]  to make the payment for the CPAP machine. Voicemail message left requesting a call back to # [phone] and ask for Opal Sidles or [employer]

## 2017-10-21 ENCOUNTER — Telehealth: Payer: Self-pay | Admitting: Physician Assistant

## 2017-10-21 NOTE — Telephone Encounter (Signed)
As per Dorise Hiss, [facility] scheduler the patient came to the clinic after this call to check on the status of her CPAP machine. This CM informed Raynelle Fanning that the CPAP machine still has not been received from [employer].  Raynelle Fanning  then informed the patient of this information.

## 2017-10-21 NOTE — Telephone Encounter (Signed)
Patient called requesting information about her CPAP machine. Please follow up with patient.

## 2017-10-22 ENCOUNTER — Ambulatory Visit (INDEPENDENT_AMBULATORY_CARE_PROVIDER_SITE_OTHER): Payer: Self-pay | Admitting: Physician Assistant

## 2017-10-22 ENCOUNTER — Encounter (INDEPENDENT_AMBULATORY_CARE_PROVIDER_SITE_OTHER): Payer: Self-pay | Admitting: Physician Assistant

## 2017-10-22 ENCOUNTER — Other Ambulatory Visit: Payer: Self-pay

## 2017-10-22 VITALS — BP 148/92 | HR 79 | Temp 98.0°F | Ht 67.5 in | Wt 224.6 lb

## 2017-10-22 DIAGNOSIS — I10 Essential (primary) hypertension: Secondary | ICD-10-CM

## 2017-10-22 DIAGNOSIS — G8929 Other chronic pain: Secondary | ICD-10-CM

## 2017-10-22 DIAGNOSIS — K219 Gastro-esophageal reflux disease without esophagitis: Secondary | ICD-10-CM

## 2017-10-22 DIAGNOSIS — M25561 Pain in right knee: Secondary | ICD-10-CM

## 2017-10-22 DIAGNOSIS — M25562 Pain in left knee: Secondary | ICD-10-CM

## 2017-10-22 MED ORDER — ACETAMINOPHEN 500 MG PO TABS: 1000.0000 mg | ORAL_TABLET | Freq: Three times a day (TID) | ORAL | 0 refills | Status: AC | PRN

## 2017-10-22 MED ORDER — ESOMEPRAZOLE MAGNESIUM 40 MG PO CPDR: 40.0000 mg | DELAYED_RELEASE_CAPSULE | Freq: Every day | ORAL | 5 refills | Status: DC

## 2017-10-22 MED ORDER — HYDROCHLOROTHIAZIDE 12.5 MG PO TABS: 12.5000 mg | ORAL_TABLET | Freq: Every day | ORAL | 5 refills | Status: DC

## 2017-10-22 MED FILL — HYDROCHLOROTHIAZIDE 12.5 MG: 12.5 | 30 days supply | Qty: 30 | Fill #0

## 2017-10-22 NOTE — Progress Notes (Signed)
Subjective:  Patient ID: [name], female    DOB: January 18, 1971  Age: 47 y.o. MRN: [id number]  CC: abdominal pain and knee pain  HPI [name] a 47 y.o.femalewith a medical history of Anxiety, Bipolar disorder, depression, GERD, sickle cell trait, and sleep apnea presents with abdominal pain attributed to gastritis and lack of PPI. Ran out of Nexium which was moderately helpful.     Outpatient Medications Prior to Visit  Medication Sig Dispense Refill  . aspirin EC 325 MG tablet Take 1 tablet (325 mg total) by mouth daily. (Patient not taking: Reported on 10/22/2017) 30 tablet 0  . esomeprazole (NEXIUM) 40 MG capsule Take 1 capsule (40 mg total) by mouth daily. (Patient not taking: Reported on 10/22/2017) 30 capsule 3   No facility-administered medications prior to visit.      ROS Review of Systems  Constitutional: Negative for chills, fever and malaise/fatigue.  Eyes: Negative for blurred vision.  Respiratory: Negative for shortness of breath.   Cardiovascular: Negative for chest pain and palpitations.  Gastrointestinal: Positive for abdominal pain. Negative for nausea.  Genitourinary: Negative for dysuria and hematuria.  Musculoskeletal: Positive for joint pain. Negative for myalgias.  Skin: Negative for rash.  Neurological: Negative for tingling and headaches.  Psychiatric/Behavioral: Negative for depression. The patient is not nervous/anxious.     Objective:  BP (!) 148/92 (BP Location: Left Arm, Patient Position: Sitting, Cuff Size: Large)   Pulse 79   Temp 98 F (36.7 C) (Oral)   Ht 5' 7.5" (1.715 m)   Wt 224 lb 9.6 oz (101.9 kg)   LMP 10/22/2017 (Exact Date)   SpO2 100%   BMI 34.66 kg/m   BP/Weight 10/22/2017 08/06/2017 3/82/5053  Systolic BP 976 734 -  Diastolic BP 92 93 -  Wt. (Lbs) 224.6 - 235  BMI 34.66 - 36.26      Physical Exam  Constitutional: She is oriented to person, place, and time.  Well developed, centrally obese, NAD, polite   HENT:  Head: Normocephalic and atraumatic.  Eyes: No scleral icterus.  Neck: Normal range of motion. Neck supple. No thyromegaly present.  Cardiovascular: Normal rate, regular rhythm and normal heart sounds.  No LE edema bilaterally  Pulmonary/Chest: Effort normal and breath sounds normal.  Abdominal: Soft. Bowel sounds are normal. She exhibits no distension. There is no tenderness.  Musculoskeletal: She exhibits no edema.  Mild TTP along surgical scar on left knee  Neurological: She is alert and oriented to person, place, and time.  Skin: Skin is warm and dry. No rash noted. No erythema. No pallor.  Psychiatric: She has a normal mood and affect. Her behavior is normal. Thought content normal.  Vitals reviewed.    Assessment & Plan:    1. Chronic pain of both knees - AMB referral to orthopedics - Begin acetaminophen (TYLENOL) 500 MG tablet; Take 2 tablets (1,000 mg total) by mouth every 8 (eight) hours as needed for up to 7 days.  Dispense: 42 tablet; Refill: 0 - Advised to avoid NSAIDs such as Ibuprofen and Naproxen.  2. Gastroesophageal reflux disease, esophagitis presence not specified - Refill esomeprazole (NEXIUM) 40 MG capsule; Take 1 capsule (40 mg total) by mouth daily.  Dispense: 30 capsule; Refill: 5  3. Hypertension, unspecified type - Begin hydrochlorothiazide (HYDRODIURIL) 12.5 MG tablet; Take 1 tablet (12.5 mg total) by mouth daily.  Dispense: 30 tablet; Refill: 5   Meds ordered this encounter  Medications  . hydrochlorothiazide (HYDRODIURIL) 12.5 MG tablet  Sig: Take 1 tablet (12.5 mg total) by mouth daily.    Dispense:  30 tablet    Refill:  5    Order Specific Question:   Supervising Provider    Answer:   Charlott Rakes [[id number]]  . esomeprazole (NEXIUM) 40 MG capsule    Sig: Take 1 capsule (40 mg total) by mouth daily.    Dispense:  30 capsule    Refill:  5    Order Specific Question:   Supervising Provider    Answer:   Charlott Rakes [[id number]]  .  acetaminophen (TYLENOL) 500 MG tablet    Sig: Take 2 tablets (1,000 mg total) by mouth every 8 (eight) hours as needed for up to 7 days.    Dispense:  42 tablet    Refill:  0    Order Specific Question:   Supervising Provider    Answer:   Charlott Rakes [[id number]]    Follow-up: Return in about 6 weeks (around 12/03/2017).   Clent Demark PA

## 2017-10-22 NOTE — Patient Instructions (Signed)

## 2017-10-22 NOTE — Progress Notes (Signed)
Knots on shin are getting bigger  Pt feels like something is stuck in her throat Pt states when she coughs she vomits and has seen blood

## 2017-10-28 ENCOUNTER — Ambulatory Visit (INDEPENDENT_AMBULATORY_CARE_PROVIDER_SITE_OTHER): Payer: Self-pay | Admitting: Physician Assistant

## 2017-10-30 ENCOUNTER — Ambulatory Visit: Payer: Self-pay

## 2017-10-31 ENCOUNTER — Telehealth: Payer: Self-pay

## 2017-10-31 NOTE — Telephone Encounter (Signed)
Call placed to the [employer] # [phone] in attempt to make payment for the CPAP machine. Voicemail message left requesting a call back to this CM or [place] # [phone].

## 2017-10-31 NOTE — Telephone Encounter (Signed)
Call received from [facility] with [employer]. $100 donation paid by [facility].  She said that the machine should be ready for shipment in the next 1-2 days.

## 2017-11-06 ENCOUNTER — Telehealth: Payer: Self-pay

## 2017-11-06 ENCOUNTER — Ambulatory Visit: Payer: Self-pay | Attending: Physician Assistant

## 2017-11-06 NOTE — Telephone Encounter (Signed)
Attempted to contact the patient to inform her that her CPAP machine has arrived and  CPAP  teaching has been scheduled for 11/15/17 @ 1000 @ [place] with  respiratory therapist.call placed to [phone] (H) and a message was left requesting a call back to # [phone].

## 2017-11-06 NOTE — Telephone Encounter (Signed)
Call received from patient and informed her of CPAP teaching on 11/15/17 @ 1000 @ [place]

## 2017-11-08 MED FILL — ?ESOMEPRAZOLE MAG DR 40MG C: 40 | 30 days supply | Qty: 30 | Fill #0

## 2017-11-15 ENCOUNTER — Telehealth: Payer: Self-pay

## 2017-11-15 NOTE — Telephone Encounter (Signed)
The patient met with Cristie Hem. RT [facility] for CPAP teaching including use/care of the machine.  She as given the machine at the end of her teaching session.

## 2017-12-02 ENCOUNTER — Telehealth: Payer: Self-pay | Admitting: Physician Assistant

## 2017-12-02 NOTE — Telephone Encounter (Signed)
Pt called to request her CAFA Letter, she is going to a specialist but never received her OC and CAFA letter. Please follow up as soon as possible

## 2017-12-03 ENCOUNTER — Encounter (INDEPENDENT_AMBULATORY_CARE_PROVIDER_SITE_OTHER): Payer: Self-pay | Admitting: Physician Assistant

## 2017-12-03 ENCOUNTER — Other Ambulatory Visit: Payer: Self-pay

## 2017-12-03 ENCOUNTER — Ambulatory Visit (INDEPENDENT_AMBULATORY_CARE_PROVIDER_SITE_OTHER): Payer: Self-pay | Admitting: Physician Assistant

## 2017-12-03 VITALS — BP 141/103 | HR 87 | Temp 98.0°F | Ht 67.5 in | Wt 222.0 lb

## 2017-12-03 DIAGNOSIS — I10 Essential (primary) hypertension: Secondary | ICD-10-CM

## 2017-12-03 DIAGNOSIS — R1013 Epigastric pain: Secondary | ICD-10-CM

## 2017-12-03 DIAGNOSIS — M25561 Pain in right knee: Secondary | ICD-10-CM

## 2017-12-03 DIAGNOSIS — F17200 Nicotine dependence, unspecified, uncomplicated: Secondary | ICD-10-CM

## 2017-12-03 DIAGNOSIS — M25562 Pain in left knee: Secondary | ICD-10-CM

## 2017-12-03 DIAGNOSIS — F1010 Alcohol abuse, uncomplicated: Secondary | ICD-10-CM

## 2017-12-03 DIAGNOSIS — G8929 Other chronic pain: Secondary | ICD-10-CM

## 2017-12-03 MED ORDER — VARENICLINE TARTRATE 0.5 MG X 11 & 1 MG X 42 PO MISC: ORAL | 0 refills | Status: DC

## 2017-12-03 MED ORDER — VARENICLINE TARTRATE 1 MG PO TABS: 1.0000 mg | ORAL_TABLET | Freq: Two times a day (BID) | ORAL | 3 refills | Status: DC

## 2017-12-03 NOTE — Progress Notes (Signed)
Subjective:  Patient ID: [name], female    DOB: [birth year]  Age: 47 y.o. MRN: [id number]  CC: knee pain, GERD, HTN   HPI [name] a 47 y.o.femalewith a medical history of Anxiety, Bipolar disorder, depression, GERD, sickle cell trait, tobacco use disorder, and sleep apnea presents to f/u on knee pain, HTN, and GERD.      Last BP 148/92 mmHg six weeks ago. Was prescribed HCTZ 12.5 mg and is reportedly taking as directed except for this morning. Says she forgot to take this morning.      Pt currently drinking "a fifth and a half" a day. She is planning to go into an alcohol rehabilitation program soon. Plans to contact [facility] today. Continues to have epigastric pain. Omeprazole and Esomeprazole have not been helpful. Had remote episodes of hematemesis and BRBPR.       Patient still has bilateral knee pain, has an appointment with ortho on Dec 09, 2017. Tylenol 500 mg is not helpful in reducing knee pain.      Wants to quit smoking. Smoking since 31 years ago. Smokes two packs a day. Tried Wellbutrin without resolution of smoking. Tried Chantix which was helpful but patient began smoking again.   Outpatient Medications Prior to Visit  Medication Sig Dispense Refill  . esomeprazole (NEXIUM) 40 MG capsule Take 1 capsule (40 mg total) by mouth daily. 30 capsule 5  . hydrochlorothiazide (HYDRODIURIL) 12.5 MG tablet Take 1 tablet (12.5 mg total) by mouth daily. 30 tablet 5   No facility-administered medications prior to visit.      ROS Review of Systems  Constitutional: Negative for chills, fever and malaise/fatigue.  Eyes: Negative for blurred vision.  Respiratory: Negative for shortness of breath.   Cardiovascular: Negative for chest pain and palpitations.  Gastrointestinal: Positive for abdominal pain. Negative for nausea.  Genitourinary: Negative for dysuria and hematuria.  Musculoskeletal: Positive for joint pain. Negative for myalgias.  Skin: Negative for rash.   Neurological: Negative for tingling and headaches.  Psychiatric/Behavioral: Negative for depression. The patient is not nervous/anxious.     Objective:  BP (!) 141/103 (BP Location: Left Arm, Patient Position: Sitting, Cuff Size: Large)   Pulse 87   Temp 98 F (36.7 C) (Oral)   Ht 5' 7.5" (1.715 m)   Wt 222 lb (100.7 kg)   LMP 11/22/2017 (Exact Date)   SpO2 100%   BMI 34.26 kg/m   BP/Weight 12/03/2017 03/29/5807 11/10/3380  Systolic BP 505 397 673  Diastolic BP 419 92 93  Wt. (Lbs) 222 224.6 -  BMI 34.26 34.66 -      Physical Exam  Constitutional: She is oriented to person, place, and time.  Well developed, well nourished, NAD, polite  HENT:  Head: Normocephalic and atraumatic.  Eyes: No scleral icterus.  Neck: Normal range of motion. Neck supple. No thyromegaly present.  Cardiovascular: Normal rate, regular rhythm and normal heart sounds.  No LE edema.  Pulmonary/Chest: Effort normal and breath sounds normal.  Abdominal: Soft. Bowel sounds are normal. There is tenderness (mild epigastric TTP).  Musculoskeletal: She exhibits no edema.  Neurological: She is alert and oriented to person, place, and time.  Skin: Skin is warm and dry. No rash noted. No erythema. No pallor.  Psychiatric: She has a normal mood and affect. Her behavior is normal. Thought content normal.  Vitals reviewed.    Assessment & Plan:   1. Chronic pain of both knees - Pt advised to keep ortho appointment  2. Hypertension, unspecified type - Has not taken HCTZ "this morning", I suspect there may be other occurrences of anti-hypertensive noncompliance. Alcohol intake is also affecting proper control of BP.  3. Abdominal pain, chronic, epigastric - Lipase - CBC with Differential - Comprehensive metabolic panel - Likely pancreatic in nature due to excessive alcohol consumption.  4. Tobacco use disorder - Begin varenicline (CHANTIX CONTINUING MONTH PAK) 1 MG tablet; Take 1 tablet (1 mg total) by  mouth 2 (two) times daily.  Dispense: 60 tablet; Refill: 3 - Begin varenicline (CHANTIX STARTING MONTH PAK) 0.5 MG X 11 & 1 MG X 42 tablet; Take one 0.5 mg tablet by mouth once daily for 3 days, then increase to one 0.5 mg tablet twice daily for 4 days, then increase to one 1 mg tablet twice daily.  Dispense: 53 tablet; Refill: 0  5. Alcohol abuse - Lipase - CBC with Differential - Comprehensive metabolic panel   Meds ordered this encounter  Medications  . varenicline (CHANTIX CONTINUING MONTH PAK) 1 MG tablet    Sig: Take 1 tablet (1 mg total) by mouth 2 (two) times daily.    Dispense:  60 tablet    Refill:  3    Order Specific Question:   Supervising Provider    Answer:   Charlott Rakes [[id number]]  . varenicline (CHANTIX STARTING MONTH PAK) 0.5 MG X 11 & 1 MG X 42 tablet    Sig: Take one 0.5 mg tablet by mouth once daily for 3 days, then increase to one 0.5 mg tablet twice daily for 4 days, then increase to one 1 mg tablet twice daily.    Dispense:  53 tablet    Refill:  0    Order Specific Question:   Supervising Provider    Answer:   Charlott Rakes [[id number]]    Follow-up: Return in about 4 weeks (around 12/31/2017) for HTN and alcoholism.   Clent Demark PA

## 2017-12-03 NOTE — Patient Instructions (Signed)

## 2017-12-04 ENCOUNTER — Telehealth (INDEPENDENT_AMBULATORY_CARE_PROVIDER_SITE_OTHER): Payer: Self-pay

## 2017-12-04 LAB — COMPREHENSIVE METABOLIC PANEL
ALT: 41 IU/L — ABNORMAL HIGH (ref 0–32)
AST: 71 IU/L — ABNORMAL HIGH (ref 0–40)
Albumin/Globulin Ratio: 1.4 (ref 1.2–2.2)
Albumin: 4.3 g/dL (ref 3.5–5.5)
Alkaline Phosphatase: 128 IU/L — ABNORMAL HIGH (ref 39–117)
BUN/Creatinine Ratio: 10 (ref 9–23)
BUN: 8 mg/dL (ref 6–24)
Bilirubin Total: 0.5 mg/dL (ref 0.0–1.2)
CO2: 21 mmol/L (ref 20–29)
Calcium: 10.2 mg/dL (ref 8.7–10.2)
Chloride: 101 mmol/L (ref 96–106)
Creatinine, Ser: 0.8 mg/dL (ref 0.57–1.00)
GFR calc Af Amer: 102 mL/min/{1.73_m2} (ref >59)
GFR calc non Af Amer: 88 mL/min/{1.73_m2} (ref >59)
Globulin, Total: 3.1 g/dL (ref 1.5–4.5)
Glucose: 96 mg/dL (ref 65–99)
Potassium: 4.4 mmol/L (ref 3.5–5.2)
Sodium: 141 mmol/L (ref 134–144)
Total Protein: 7.4 g/dL (ref 6.0–8.5)

## 2017-12-04 LAB — CBC WITH DIFFERENTIAL/PLATELET
Basophils Absolute: 0.1 10*3/uL (ref 0.0–0.2)
Basos: 1 %
EOS (ABSOLUTE): 0.1 10*3/uL (ref 0.0–0.4)
Eos: 1 %
Hematocrit: 35.3 % (ref 34.0–46.6)
Hemoglobin: 11.1 g/dL (ref 11.1–15.9)
Immature Grans (Abs): 0 10*3/uL (ref 0.0–0.1)
Immature Granulocytes: 0 %
Lymphocytes Absolute: 3.2 10*3/uL — ABNORMAL HIGH (ref 0.7–3.1)
Lymphs: 33 %
MCH: 26.1 pg — ABNORMAL LOW (ref 26.6–33.0)
MCHC: 31.4 g/dL — ABNORMAL LOW (ref 31.5–35.7)
MCV: 83 fL (ref 79–97)
Monocytes Absolute: 0.7 10*3/uL (ref 0.1–0.9)
Monocytes: 8 %
Neutrophils Absolute: 5.5 10*3/uL (ref 1.4–7.0)
Neutrophils: 57 %
Platelets: 452 10*3/uL — ABNORMAL HIGH (ref 150–450)
RBC: 4.25 x10E6/uL (ref 3.77–5.28)
RDW: 19.7 % — ABNORMAL HIGH (ref 12.3–15.4)
WBC: 9.5 10*3/uL (ref 3.4–10.8)

## 2017-12-04 LAB — LIPASE: Lipase: 27 U/L (ref 14–72)

## 2017-12-04 NOTE — Telephone Encounter (Signed)
Patient is aware that there are abnormalities with her red blood cells and lover enzymes; likely due to alcohol consumption. Encouraged patient to seek rehabilitation as she said she would. Nat Christen, CMA

## 2017-12-04 NOTE — Telephone Encounter (Signed)
-----   Message from Clent Demark, PA-C sent at 12/04/2017 12:18 PM EDT ----- Abnormalities with red blood cells and liver enzymes. Likely due to alcohol consumption. Make sure to seek rehabilitation like she said she would.

## 2017-12-05 ENCOUNTER — Emergency Department (HOSPITAL_COMMUNITY): Payer: Self-pay

## 2017-12-05 ENCOUNTER — Encounter (HOSPITAL_COMMUNITY): Payer: Self-pay | Admitting: Emergency Medicine

## 2017-12-05 ENCOUNTER — Other Ambulatory Visit: Payer: Self-pay

## 2017-12-05 ENCOUNTER — Emergency Department (HOSPITAL_COMMUNITY)
Admission: EM | Admit: 2017-12-05 | Discharge: 2017-12-05 | Disposition: A | Discharge status: Home / Self Care | Payer: Self-pay | Attending: Emergency Medicine | Admitting: Emergency Medicine

## 2017-12-05 DIAGNOSIS — F1010 Alcohol abuse, uncomplicated: Secondary | ICD-10-CM

## 2017-12-05 DIAGNOSIS — F17210 Nicotine dependence, cigarettes, uncomplicated: Secondary | ICD-10-CM | POA: Insufficient documentation

## 2017-12-05 DIAGNOSIS — F1410 Cocaine abuse, uncomplicated: Secondary | ICD-10-CM | POA: Insufficient documentation

## 2017-12-05 DIAGNOSIS — R112 Nausea with vomiting, unspecified: Secondary | ICD-10-CM | POA: Insufficient documentation

## 2017-12-05 DIAGNOSIS — Z91040 Latex allergy status: Secondary | ICD-10-CM | POA: Insufficient documentation

## 2017-12-05 DIAGNOSIS — M545 Low back pain, unspecified: Secondary | ICD-10-CM

## 2017-12-05 DIAGNOSIS — Z79899 Other long term (current) drug therapy: Secondary | ICD-10-CM | POA: Insufficient documentation

## 2017-12-05 DIAGNOSIS — R1012 Left upper quadrant pain: Secondary | ICD-10-CM | POA: Insufficient documentation

## 2017-12-05 LAB — CBC
HCT: 34.5 % — ABNORMAL LOW (ref 36.0–46.0)
Hemoglobin: 11 g/dL — ABNORMAL LOW (ref 12.0–15.0)
MCH: 26.7 pg (ref 26.0–34.0)
MCHC: 31.9 g/dL (ref 30.0–36.0)
MCV: 83.7 fL (ref 78.0–100.0)
Platelets: 403 10*3/uL — ABNORMAL HIGH (ref 150–400)
RBC: 4.12 MIL/uL (ref 3.87–5.11)
RDW: 20.3 % — ABNORMAL HIGH (ref 11.5–15.5)
WBC: 10.1 10*3/uL (ref 4.0–10.5)

## 2017-12-05 LAB — URINALYSIS, ROUTINE W REFLEX MICROSCOPIC
Bilirubin Urine: NEGATIVE
Glucose, UA: NEGATIVE mg/dL
Hgb urine dipstick: NEGATIVE
Ketones, ur: NEGATIVE mg/dL
Leukocytes, UA: NEGATIVE
Nitrite: NEGATIVE
Protein, ur: NEGATIVE mg/dL
Specific Gravity, Urine: >1.046 — ABNORMAL HIGH (ref 1.005–1.030)
pH: 5 (ref 5.0–8.0)

## 2017-12-05 LAB — COMPREHENSIVE METABOLIC PANEL
ALT: 31 U/L (ref 0–44)
AST: 45 U/L — ABNORMAL HIGH (ref 15–41)
Albumin: 3.5 g/dL (ref 3.5–5.0)
Alkaline Phosphatase: 103 U/L (ref 38–126)
Anion gap: 11 (ref 5–15)
BUN: 5 mg/dL — ABNORMAL LOW (ref 6–20)
CO2: 24 mmol/L (ref 22–32)
Calcium: 9.2 mg/dL (ref 8.9–10.3)
Chloride: 102 mmol/L (ref 98–111)
Creatinine, Ser: 0.75 mg/dL (ref 0.44–1.00)
GFR calc Af Amer: >60 mL/min (ref >60)
GFR calc non Af Amer: >60 mL/min (ref >60)
Glucose, Bld: 122 mg/dL — ABNORMAL HIGH (ref 70–99)
Potassium: 3.2 mmol/L — ABNORMAL LOW (ref 3.5–5.1)
Sodium: 137 mmol/L (ref 135–145)
Total Bilirubin: 0.5 mg/dL (ref 0.3–1.2)
Total Protein: 7.2 g/dL (ref 6.5–8.1)

## 2017-12-05 LAB — LIPASE, BLOOD: Lipase: 29 U/L (ref 11–51)

## 2017-12-05 LAB — I-STAT BETA HCG BLOOD, ED (MC, WL, AP ONLY): I-stat hCG, quantitative: <5 m[IU]/mL (ref <5)

## 2017-12-05 LAB — ETHANOL: Alcohol, Ethyl (B): <10 mg/dL (ref <10)

## 2017-12-05 MED ORDER — FAMOTIDINE IN NACL 20-0.9 MG/50ML-% IV SOLN
20.0000 mg | Freq: Once | INTRAVENOUS | Status: AC
  Administered 2017-12-05: 20 mg via INTRAVENOUS
  Filled 2017-12-05: qty 50

## 2017-12-05 MED ORDER — IOHEXOL 300 MG/ML  SOLN
100.0000 mL | Freq: Once | INTRAMUSCULAR | Status: AC | PRN
  Administered 2017-12-05: 100 mL via INTRAVENOUS

## 2017-12-05 MED ORDER — SODIUM CHLORIDE 0.9 % IV BOLUS
1000.0000 mL | Freq: Once | INTRAVENOUS | Status: AC
  Administered 2017-12-05: 1000 mL via INTRAVENOUS

## 2017-12-05 MED ORDER — GI COCKTAIL ~~LOC~~
30.0000 mL | Freq: Once | ORAL | Status: AC
  Administered 2017-12-05: 30 mL via ORAL
  Filled 2017-12-05: qty 30

## 2017-12-05 MED ORDER — POTASSIUM CHLORIDE CRYS ER 20 MEQ PO TBCR
40.0000 meq | EXTENDED_RELEASE_TABLET | Freq: Once | ORAL | Status: AC
  Administered 2017-12-05: 40 meq via ORAL
  Filled 2017-12-05: qty 2

## 2017-12-05 MED ORDER — MORPHINE SULFATE (PF) 4 MG/ML IV SOLN
4.0000 mg | Freq: Once | INTRAVENOUS | Status: AC
  Administered 2017-12-05: 4 mg via INTRAVENOUS
  Filled 2017-12-05: qty 1

## 2017-12-05 MED ORDER — ONDANSETRON 4 MG PO TBDP: ORAL_TABLET | ORAL | 0 refills | Status: DC

## 2017-12-05 MED ORDER — ONDANSETRON HCL 4 MG/2ML IJ SOLN
4.0000 mg | Freq: Once | INTRAMUSCULAR | Status: AC
  Administered 2017-12-05: 4 mg via INTRAVENOUS
  Filled 2017-12-05: qty 2

## 2017-12-05 MED ORDER — SUCRALFATE 1 GM/10ML PO SUSP: 1.0000 g | Freq: Three times a day (TID) | ORAL | 0 refills | Status: DC

## 2017-12-05 NOTE — ED Provider Notes (Signed)
[facility] EMERGENCY DEPARTMENT Provider Note   CSN: [id number] Arrival date & time: 12/05/17  0349     History   Chief Complaint Chief Complaint  Patient presents with  . Abdominal Pain  . Flank Pain    HPI [name] is a 47 y.o. female.  [name] is a 47 y.o. Female with history of alcoholism, GERD, bipolar disorder and sickle cell trait, who presents for evaluation of LUQ abdominal pain. This pain has been present for the past 6-7 months, but recently worsening.  She reports nausea with intermittent vomiting, no hematemesis. No diarrhea, melena, hematochezia or constipation. No associated dysuria or urinary frequency. Denies vaginal discharge or bleeding. No chest pain, shortness of breath or cough. No fevers or chills. Reports she saw her PCP yesterday and was told her liver function was up. Pt has not taken anything to treat her symptoms prior to arrival no other aggravating or alleviating factors. Pt reports she typically drinks about a fifth and a half of liquor a day, last drank of Tuesday because she was in court all day yesterday, also smokes tobacco. PT also reports low back pain worsening over the past few days, radiates into her legs, no loss of bowel or bladder control and no saddle anesthesia. No hx of cancer or IVDU.     Past Medical History:  Diagnosis Date  . Anxiety   . Blood transfusion    1989 at [facility]  . Bronchitis   . Chronic bipolar disorder ([place])   . Chronic kidney disease   . Depression    bipolar  . GERD (gastroesophageal reflux disease)   . Headache(784.0)    occasional  . Mental disorder    bipolar  . Pyelonephritis 10/2010  . Recurrent upper respiratory infection (URI)    states she has not been to MD and feels like she has  bronchitis now- greenish sputum  . Shortness of breath    sometimes with exertion  . Sickle cell trait ([place])   . Sleep apnea    CPAP  . UTI (lower urinary tract infection)  10/2010    Patient Active Problem List   Diagnosis Date Noted  . Fracture of tibial plateau, closed 03/08/2011  . GERD (gastroesophageal reflux disease) 03/08/2011  . Bipolar disorder ([place]) 03/08/2011  . OSA (obstructive sleep apnea) 03/08/2011  . Obesity 03/08/2011  . Nicotine dependence 03/08/2011    Past Surgical History:  Procedure Laterality Date  . CESAREAN SECTION    . CESAREAN SECTION    . KNEE ARTHROSCOPY  06/15/2011   Procedure: ARTHROSCOPY KNEE;  Surgeon: Rozanna Box, MD;  Location: [facility];  Service: Orthopedics;  Laterality: Left;  LEFT KNEE SCOPE WITH LYSIS OF ADHESIONS AND MANIPULATION  . ORIF TIBIA PLATEAU  03/08/2011   Procedure: OPEN REDUCTION INTERNAL FIXATION (ORIF) TIBIAL PLATEAU;  Surgeon: Rozanna Box;  Location: [facility];  Service: Orthopedics;  Laterality: Left;  . TUBAL LIGATION       OB History   None      Home Medications    Prior to Admission medications   Medication Sig Start Date End Date Taking? Authorizing Provider  hydrochlorothiazide (HYDRODIURIL) 12.5 MG tablet Take 1 tablet (12.5 mg total) by mouth daily. 10/22/17   Clent Demark, PA-C  varenicline (CHANTIX CONTINUING MONTH PAK) 1 MG tablet Take 1 tablet (1 mg total) by mouth 2 (two) times daily. 12/03/17   Clent Demark, PA-C  varenicline ([place]  PAK) 0.5 MG X 11 & 1 MG X 42 tablet Take one 0.5 mg tablet by mouth once daily for 3 days, then increase to one 0.5 mg tablet twice daily for 4 days, then increase to one 1 mg tablet twice daily. 12/03/17   Clent Demark, PA-C    Family History Family History  Problem Relation Age of Onset  . Thyroid disease Father   . Diabetes Other   . Cancer Other   . Anesthesia problems Neg Hx     Social History Social History   Tobacco Use  . Smoking status: Current Every Day Smoker    Packs/day: 1.50    Years: 24.00    Pack years: 36.00    Types: Cigarettes  . Smokeless tobacco: Never Used  Substance Use Topics  .  Alcohol use: Yes    Comment: drinks liquor everyday  . Drug use: Yes    Types: "Crack" cocaine    Comment: none since 2000     Allergies   Aspirin; Banana; Latex; Penicillins; Septra [bactrim]; Shellfish allergy; Strawberry extract; and Tape   Review of Systems Review of Systems  Constitutional: Negative for chills and fever.  HENT: Negative.   Eyes: Negative for visual disturbance.  Respiratory: Negative for cough and shortness of breath.   Cardiovascular: Negative for chest pain.  Gastrointestinal: Positive for abdominal pain, nausea and vomiting. Negative for blood in stool, constipation and diarrhea.  Musculoskeletal: Positive for back pain. Negative for arthralgias and neck pain.  Skin: Negative for color change and rash.  Neurological: Negative for dizziness, syncope and light-headedness.     Physical Exam Updated Vital Signs BP (!) 138/110   Pulse 98   Temp 98.2 F (36.8 C) (Oral)   Resp 18   Ht 5\' 7"  (1.702 m)   Wt 100.7 kg   LMP 11/22/2017 (Exact Date)   SpO2 100%   BMI 34.77 kg/m   Physical Exam  Constitutional: She is oriented to person, place, and time. She appears well-developed and well-nourished. She does not appear ill. No distress.  HENT:  Head: Normocephalic and atraumatic.  Mouth/Throat: Oropharynx is clear and moist.  Eyes: Right eye exhibits no discharge. Left eye exhibits no discharge.  Cardiovascular: Normal rate, regular rhythm, normal heart sounds and intact distal pulses.  Pulmonary/Chest: Effort normal and breath sounds normal. No respiratory distress.  Respirations equal and unlabored, patient able to speak in full sentences, lungs clear to auscultation bilaterally  Abdominal: Soft. Normal appearance and bowel sounds are normal. She exhibits no distension. There is tenderness in the epigastric area, periumbilical area and left upper quadrant. There is guarding. There is no rigidity, no rebound and no CVA tenderness.  Abdomen soft,  nondistended, bowel sounds present throughout, there is tenderness in the left upper quadrant and epigastric regions as well as the periumbilical region with guarding on palpation, no rigidity, no CVA tenderness bilaterally.  Musculoskeletal:  Tenderness across the lower back without overlying skin changes or palpable deformity.  Neurological: She is alert and oriented to person, place, and time. Coordination normal.  Speech is clear, able to follow commands CN III-XII intact Normal strength in upper and lower extremities bilaterally including dorsiflexion and plantar flexion, strong and equal grip strength Sensation normal to light and sharp touch Moves extremities without ataxia, coordination intact  Skin: Skin is warm and dry. She is not diaphoretic.  Psychiatric: She has a normal mood and affect. Her behavior is normal.  Nursing note and vitals reviewed.  ED Treatments / Results  Labs (all labs ordered are listed, but only abnormal results are displayed) Labs Reviewed  COMPREHENSIVE METABOLIC PANEL - Abnormal; Notable for the following components:      Result Value   Potassium 3.2 (*)    Glucose, Bld 122 (*)    BUN 5 (*)    AST 45 (*)    All other components within normal limits  CBC - Abnormal; Notable for the following components:   Hemoglobin 11.0 (*)    HCT 34.5 (*)    RDW 20.3 (*)    Platelets 403 (*)    All other components within normal limits  URINALYSIS, ROUTINE W REFLEX MICROSCOPIC - Abnormal; Notable for the following components:   Specific Gravity, Urine >1.046 (*)    All other components within normal limits  LIPASE, BLOOD  ETHANOL  I-STAT BETA HCG BLOOD, ED ([facility], [facility], [facility] ONLY)    EKG None  Radiology Ct Abdomen Pelvis W Contrast  Result Date: 12/05/2017 CLINICAL DATA:  Acute abdominal pain.  Left flank pain. EXAM: CT ABDOMEN AND PELVIS WITH CONTRAST TECHNIQUE: Multidetector CT imaging of the abdomen and pelvis was performed using the standard protocol  following bolus administration of intravenous contrast. CONTRAST:  120mL OMNIPAQUE IOHEXOL 300 MG/ML  SOLN COMPARISON:  CT 12/12/2016 FINDINGS: Lower chest: The lung bases are clear. Hepatobiliary: Decreased hepatic density consistent with steatosis. Focal fatty infiltration adjacent with falciform ligament. Gallbladder physiologically distended, no calcified stone. No biliary dilatation. Pancreas: No ductal dilatation or inflammation. Spleen: Normal in size without focal abnormality. Adrenals/Urinary Tract: Normal adrenal glands. No hydronephrosis or perinephric edema. Homogeneous renal enhancement with symmetric excretion on delayed phase imaging. Urinary bladder is nondistended without wall thickening. Stomach/Bowel: Stomach is decompressed. No bowel wall thickening, inflammatory change or obstruction. Normal appendix. Few diverticula in the descending and sigmoid colon without diverticulitis. Vascular/Lymphatic: No significant vascular findings are present. No enlarged abdominal or pelvic lymph nodes. Reproductive: Fibroid uterus with dominant fibroid anteriorly measuring 4.6 cm ovaries symmetric and normal in size. No adnexal mass. Other: No free air, free fluid, or intra-abdominal fluid collection. Small fat containing umbilical hernia. Musculoskeletal: There are no acute or suspicious osseous abnormalities. IMPRESSION: 1. No acute findings in the abdomen/pelvis. 2. Hepatic steatosis. Minimal colonic diverticulosis without diverticulitis. 3. Fibroid uterus. Electronically Signed   By: Keith Rake M.D.   On: 12/05/2017 05:40    Procedures Procedures (including critical care time)  Medications Ordered in ED Medications  potassium chloride SA (K-DUR,KLOR-CON) CR tablet 40 mEq (has no administration in time range)  sodium chloride 0.9 % bolus 1,000 mL (1,000 mLs Intravenous New Bag/Given 12/05/17 0446)  ondansetron (ZOFRAN) injection 4 mg (4 mg Intravenous Given 12/05/17 0447)  morphine 4 MG/ML  injection 4 mg (4 mg Intravenous Given 12/05/17 0448)  famotidine (PEPCID) IVPB 20 mg premix (0 mg Intravenous Stopped 12/05/17 0534)  iohexol (OMNIPAQUE) 300 MG/ML solution 100 mL (100 mLs Intravenous Contrast Given 12/05/17 0511)     Initial Impression / Assessment and Plan / ED Course  I have reviewed the triage vital signs and the nursing notes.  Pertinent labs & imaging results that were available during my care of the patient were reviewed by me and considered in my medical decision making (see chart for details).  Pt presents for evaluation of LUQ abdominal pain and back pain. Pain has been present for the past 6-7 months, but recently worsening.  She reports intermittent episodes of vomiting with associated nausea and poor appetite.  No diarrhea, melena or hematochezia.  No fevers.  No urinary symptoms although patient does report some low back pain and flank pain. She has not followed up regarding the symptoms she was seen by her PCP yesterday and told that her liver enzymes were little bit elevated, she was previously taking a PPI, but her doctor discontinued this thought pain was more likely due to pancreatic inflammation from alcohol use.  On arrival she has stable vitals and is in no acute distress, does have tenderness with guarding in the epigastric, periumbilical and left upper quadrants.  Also complaining of back pain and has diffuse tenderness across the low back.  Neurologically intact, no concern for cauda equina.  Will get abdominal labs, CT abdomen pelvis with lumbar recon.  Will give IV fluids, Pepcid, Zofran, morphine and reevaluate.  Labs overall reassuring, no leukocytosis, hemoglobin is stable when compared to baseline, mild hypokalemia but no other acute electrolyte derangements, normal renal and liver function, lipase is 29, negative pregnancy, ethanol level is less than 10, and patient has not drank in the last 24 hours due to being in court.  Urinalysis without evidence of  infection.  CT abdomen pelvis shows no acute intra-abdominal findings, there is mild hepatic steatosis and diverticulosis without any evidence of diverticulitis.  CT lumbar reveals mild lumbar spondylosis. PT has upcoming orthopedic appointment and I have encouraged her to discuss this with them.  Symptoms improved somewhat with treatment here in the emergency department, she is tolerating p.o.  Referral provided to GI and patient encouraged to reduce alcohol use as I think this is significantly contributing to her symptoms, divided resources for substance abuse treatment.  Will have patient continue PPI, Carafate as needed for breakthrough symptoms.  Return precautions have been discussed with the patient she expresses understanding and is in agreement with plan.   Final Clinical Impressions(s) / ED Diagnoses   Final diagnoses:  Low back pain  Left upper quadrant pain  Non-intractable vomiting with nausea, unspecified vomiting type  Alcohol abuse    ED Discharge Orders    None       Jacqlyn Larsen, [place] 12/05/17 [id number]    Orpah Greek, MD 12/05/17 [phone]

## 2017-12-05 NOTE — Discharge Instructions (Signed)
Continue taking PPI, you can use Zofran as needed for nausea and Carafate for worsening epigastric pain.  Your labs and CT scan were overall reassuring today, and I suspect that a lot of your symptoms are due to alcohol use causing inflammation and irritation of the stomach and intestines.   Scan of your back shows some mild degenerative changes, please discuss this with the orthopedist at your upcoming appointment.  Return for significantly worsened abdominal pain, fevers, persistent vomiting, large amounts of blood in the vomit or stool or any other new or concerning symptoms.

## 2017-12-05 NOTE — ED Notes (Signed)
Patient transported to CT 

## 2017-12-05 NOTE — ED Triage Notes (Signed)
Pt reports left abd pain and left flank pain that has been going on intermittently for the last 6-7 months and started back up yesteday. Pt reports N/V/D on and off. Pt unable to keep anything down, lack of appetite. Pt reports her doctor said her liver enzymes are elevated. Pt also reports drinking liquor everyday for the last five years, last drink last night.

## 2017-12-09 ENCOUNTER — Ambulatory Visit (INDEPENDENT_AMBULATORY_CARE_PROVIDER_SITE_OTHER): Payer: Self-pay

## 2017-12-09 ENCOUNTER — Encounter (INDEPENDENT_AMBULATORY_CARE_PROVIDER_SITE_OTHER): Payer: Self-pay | Admitting: Physician Assistant

## 2017-12-09 ENCOUNTER — Ambulatory Visit (INDEPENDENT_AMBULATORY_CARE_PROVIDER_SITE_OTHER): Payer: Self-pay | Admitting: Physician Assistant

## 2017-12-09 VITALS — Ht 67.0 in | Wt 232.0 lb

## 2017-12-09 DIAGNOSIS — M25562 Pain in left knee: Secondary | ICD-10-CM

## 2017-12-09 DIAGNOSIS — G8929 Other chronic pain: Secondary | ICD-10-CM

## 2017-12-09 DIAGNOSIS — M25561 Pain in right knee: Secondary | ICD-10-CM

## 2017-12-09 MED ORDER — METHYLPREDNISOLONE ACETATE 40 MG/ML IJ SUSP
40.0000 mg | INTRAMUSCULAR | Status: AC | PRN
  Administered 2017-12-09: 40 mg via INTRA_ARTICULAR

## 2017-12-09 MED ORDER — LIDOCAINE HCL 1 % IJ SOLN
0.5000 mL | INTRAMUSCULAR | Status: AC | PRN
  Administered 2017-12-09: .5 mL

## 2017-12-09 NOTE — Progress Notes (Signed)
Office Visit Note   Patient: [name]           Date of Birth: [birth year]           MRN: [id number] Visit Date: 12/09/2017              Requested by: Clent Demark, PA-C [place], [place] [id number] PCP: Clent Demark, PA-C   Assessment & Plan: Visit Diagnoses:  1. Chronic pain of both knees     Plan: We will have her work on quad strengthening exercises.  These exercises are demonstrated to the patient today.  See her back in 2 weeks check her response to the injections.  Follow-Up Instructions: Return in about 2 weeks (around 12/23/2017).   Orders:  Orders Placed This Encounter  Procedures  . Large Joint Inj  . XR KNEE 3 VIEW LEFT  . XR KNEE 3 VIEW RIGHT   No orders of the defined types were placed in this encounter.     Procedures: Large Joint Inj: bilateral knee on 12/09/2017 11:59 AM Indications: pain Details: 22 G 1.5 in needle, anterolateral approach  Arthrogram: No  Medications (Right): 0.5 mL lidocaine 1 %; 40 mg methylPREDNISolone acetate 40 MG/ML Medications (Left): 0.5 mL lidocaine 1 %; 40 mg methylPREDNISolone acetate 40 MG/ML Outcome: tolerated well, no immediate complications Procedure, treatment alternatives, risks and benefits explained, specific risks discussed. Consent was given by the patient. Immediately prior to procedure a time out was called to verify the correct patient, procedure, equipment, support staff and site/side marked as required. Patient was prepped and draped in the usual sterile fashion.       Clinical Data: No additional findings.   Subjective: Chief Complaint  Patient presents with  . Left Knee - Pain  . Right Knee - Pain    HPI [name] is a 47 year old female comes in today with bilateral knee pain.  She states that he had pain in the left knee since undergoing open reduction internal fixation of the left knee by Dr. Ginette Pitman in 2013 had some swelling of the left leg since then.  Now  having pain in both knees.  She is also having some back pain and some spasms into the calves and the ball of both feet.  States she has constant pain in both knees.  Knees seem to buckle at times otherwise no mechanical symptoms of either knee.  Still not to take ibuprofen due to elevated liver enzymes.  She reportedly has been consuming 1/5 and a half of liquor daily. She is recently seen in the ER on 12/05/2017 due to flank pain abdomen pain.  She underwent a CT scan of her lumbar spine which showed no fractures no malignant disease of the lumbar spine.  Some mild disc bulges at L3-4 L4-5 and L5-S1 but no significant foraminal or canal stenosis.  Review of Systems Negative for fevers chills shortness of breath chest pain  Objective: Vital Signs: Ht 5\' 7"  (1.702 m)   Wt 232 lb (105.2 kg)   LMP 11/22/2017 (Exact Date)   BMI 36.34 kg/m   Physical Exam  Constitutional: She is oriented to person, place, and time. She appears well-developed and well-nourished.  Pulmonary/Chest: Effort normal.  Neurological: She is alert and oriented to person, place, and time.  Skin: She is not diaphoretic.  Psychiatric: She has a normal mood and affect.    Ortho Exam Bilateral knees she has good range of motion of both knees crepitus  left knee patellofemoral region with passive range of motion.  Calves are supple.  She has tenderness along medial joint line of the left knee and also along the lateral joint line of the left knee.  Medial joint line tenderness right knee.  No effusion.  No instability valgus varus stressing of either knee.  No abnormal warmth erythema of either knee. Specialty Comments:  No specialty comments available.  Imaging: Xr Knee 3 View Left  Result Date: 12/09/2017 Left knee AP lateral and sunrise view: No acute fracture status post open reduction internal fixation tibial plateau fracture which is well-healed without hardware failure.  Mild to moderate medial joint line narrowing  lateral compartment well observed and patellofemoral compartment with moderate patellofemoral changes.  Patellas well located within the patellofemoral notch on the sunrise view.  Xr Knee 3 View Right  Result Date: 12/09/2017 Right knee 3 views: Mild medial compartmental narrowing and mild to moderate patellofemoral changes.  No acute fracture.    PMFS History: Patient Active Problem List   Diagnosis Date Noted  . Fracture of tibial plateau, closed 03/08/2011  . GERD (gastroesophageal reflux disease) 03/08/2011  . Bipolar disorder ([place]) 03/08/2011  . OSA (obstructive sleep apnea) 03/08/2011  . Obesity 03/08/2011  . Nicotine dependence 03/08/2011   Past Medical History:  Diagnosis Date  . Anxiety   . Blood transfusion    1989 at [place]  . Bronchitis   . Chronic bipolar disorder ([place])   . Chronic kidney disease   . Depression    bipolar  . GERD (gastroesophageal reflux disease)   . Headache(784.0)    occasional  . Mental disorder    bipolar  . Pyelonephritis 10/2010  . Recurrent upper respiratory infection (URI)    states she has not been to MD and feels like she has  bronchitis now- greenish sputum  . Shortness of breath    sometimes with exertion  . Sickle cell trait ([place])   . Sleep apnea    CPAP  . UTI (lower urinary tract infection) 10/2010    Family History  Problem Relation Age of Onset  . Thyroid disease Father   . Diabetes Other   . Cancer Other   . Anesthesia problems Neg Hx     Past Surgical History:  Procedure Laterality Date  . CESAREAN SECTION    . CESAREAN SECTION    . KNEE ARTHROSCOPY  06/15/2011   Procedure: ARTHROSCOPY KNEE;  Surgeon: Rozanna Box, MD;  Location: [facility];  Service: Orthopedics;  Laterality: Left;  LEFT KNEE SCOPE WITH LYSIS OF ADHESIONS AND MANIPULATION  . ORIF TIBIA PLATEAU  03/08/2011   Procedure: OPEN REDUCTION INTERNAL FIXATION (ORIF) TIBIAL PLATEAU;  Surgeon: Rozanna Box;  Location: [facility];  Service: Orthopedics;   Laterality: Left;  . TUBAL LIGATION     Social History   Occupational History  . Not on file  Tobacco Use  . Smoking status: Current Every Day Smoker    Packs/day: 1.50    Years: 24.00    Pack years: 36.00    Types: Cigarettes  . Smokeless tobacco: Never Used  Substance and Sexual Activity  . Alcohol use: Yes    Comment: drinks liquor everyday  . Drug use: Yes    Types: "Crack" cocaine    Comment: none since 2000  . Sexual activity: Not on file

## 2017-12-12 ENCOUNTER — Encounter: Payer: Self-pay | Admitting: Gastroenterology

## 2017-12-13 MED FILL — CARAFATE 1 GM/10 ML SUSP: 1 | 10 days supply | Qty: 420 | Fill #0

## 2017-12-13 MED FILL — ONDANSETRON ODT 4 MG TABLET: 4 | 1 days supply | Qty: 10 | Fill #0

## 2017-12-23 ENCOUNTER — Emergency Department (HOSPITAL_COMMUNITY): Payer: Self-pay

## 2017-12-23 ENCOUNTER — Encounter (HOSPITAL_COMMUNITY): Payer: Self-pay | Admitting: Emergency Medicine

## 2017-12-23 ENCOUNTER — Ambulatory Visit (INDEPENDENT_AMBULATORY_CARE_PROVIDER_SITE_OTHER): Payer: Self-pay | Admitting: Physician Assistant

## 2017-12-23 ENCOUNTER — Emergency Department (HOSPITAL_COMMUNITY)
Admission: EM | Admit: 2017-12-23 | Discharge: 2017-12-23 | Disposition: A | Discharge status: Home / Self Care | Payer: Self-pay | Attending: Emergency Medicine | Admitting: Emergency Medicine

## 2017-12-23 DIAGNOSIS — Z79899 Other long term (current) drug therapy: Secondary | ICD-10-CM | POA: Insufficient documentation

## 2017-12-23 DIAGNOSIS — Z91040 Latex allergy status: Secondary | ICD-10-CM | POA: Insufficient documentation

## 2017-12-23 DIAGNOSIS — K852 Alcohol induced acute pancreatitis without necrosis or infection: Secondary | ICD-10-CM | POA: Insufficient documentation

## 2017-12-23 DIAGNOSIS — N189 Chronic kidney disease, unspecified: Secondary | ICD-10-CM | POA: Insufficient documentation

## 2017-12-23 DIAGNOSIS — F17210 Nicotine dependence, cigarettes, uncomplicated: Secondary | ICD-10-CM | POA: Insufficient documentation

## 2017-12-23 LAB — URINALYSIS, ROUTINE W REFLEX MICROSCOPIC
Bilirubin Urine: NEGATIVE
Glucose, UA: NEGATIVE mg/dL
Ketones, ur: NEGATIVE mg/dL
Leukocytes, UA: NEGATIVE
Nitrite: NEGATIVE
Protein, ur: NEGATIVE mg/dL
Specific Gravity, Urine: 1.008 (ref 1.005–1.030)
pH: 6 (ref 5.0–8.0)

## 2017-12-23 LAB — COMPREHENSIVE METABOLIC PANEL
ALT: 28 U/L (ref 0–44)
AST: 34 U/L (ref 15–41)
Albumin: 3.4 g/dL — ABNORMAL LOW (ref 3.5–5.0)
Alkaline Phosphatase: 102 U/L (ref 38–126)
Anion gap: 8 (ref 5–15)
BUN: <5 mg/dL — ABNORMAL LOW (ref 6–20)
CO2: 22 mmol/L (ref 22–32)
Calcium: 8.9 mg/dL (ref 8.9–10.3)
Chloride: 106 mmol/L (ref 98–111)
Creatinine, Ser: 0.76 mg/dL (ref 0.44–1.00)
GFR calc Af Amer: >60 mL/min (ref >60)
GFR calc non Af Amer: >60 mL/min (ref >60)
Glucose, Bld: 124 mg/dL — ABNORMAL HIGH (ref 70–99)
Potassium: 3.8 mmol/L (ref 3.5–5.1)
Sodium: 136 mmol/L (ref 135–145)
Total Bilirubin: 0.8 mg/dL (ref 0.3–1.2)
Total Protein: 7.3 g/dL (ref 6.5–8.1)

## 2017-12-23 LAB — CBC
HCT: 36.4 % (ref 36.0–46.0)
Hemoglobin: 11 g/dL — ABNORMAL LOW (ref 12.0–15.0)
MCH: 25.5 pg — ABNORMAL LOW (ref 26.0–34.0)
MCHC: 30.2 g/dL (ref 30.0–36.0)
MCV: 84.5 fL (ref 80.0–100.0)
Platelets: 463 10*3/uL — ABNORMAL HIGH (ref 150–400)
RBC: 4.31 MIL/uL (ref 3.87–5.11)
RDW: 21.2 % — ABNORMAL HIGH (ref 11.5–15.5)
WBC: 8.4 10*3/uL (ref 4.0–10.5)
nRBC: 0 % (ref 0.0–0.2)

## 2017-12-23 LAB — I-STAT BETA HCG BLOOD, ED (MC, WL, AP ONLY): I-stat hCG, quantitative: <5 m[IU]/mL (ref <5)

## 2017-12-23 LAB — LIPASE, BLOOD: Lipase: 72 U/L — ABNORMAL HIGH (ref 11–51)

## 2017-12-23 LAB — I-STAT CG4 LACTIC ACID, ED: Lactic Acid, Venous: 1.2 mmol/L (ref 0.5–1.9)

## 2017-12-23 MED ORDER — SODIUM CHLORIDE 0.9 % IV BOLUS
1000.0000 mL | Freq: Once | INTRAVENOUS | Status: AC
  Administered 2017-12-23: 1000 mL via INTRAVENOUS

## 2017-12-23 MED ORDER — ONDANSETRON 4 MG PO TBDP: 4.0000 mg | ORAL_TABLET | Freq: Three times a day (TID) | ORAL | 0 refills | Status: DC | PRN

## 2017-12-23 MED ORDER — HYDROMORPHONE HCL 1 MG/ML IJ SOLN
1.0000 mg | Freq: Once | INTRAMUSCULAR | Status: AC
  Administered 2017-12-23: 1 mg via INTRAVENOUS
  Filled 2017-12-23: qty 1

## 2017-12-23 MED ORDER — OXYCODONE-ACETAMINOPHEN 5-325 MG PO TABS: 1.0000 | ORAL_TABLET | Freq: Four times a day (QID) | ORAL | 0 refills | Status: DC | PRN

## 2017-12-23 MED ORDER — MORPHINE SULFATE (PF) 4 MG/ML IV SOLN
4.0000 mg | Freq: Once | INTRAVENOUS | Status: AC
  Administered 2017-12-23: 4 mg via INTRAVENOUS
  Filled 2017-12-23: qty 1

## 2017-12-23 MED ORDER — IOHEXOL 300 MG/ML  SOLN
100.0000 mL | Freq: Once | INTRAMUSCULAR | Status: AC | PRN
  Administered 2017-12-23: 100 mL via INTRAVENOUS

## 2017-12-23 MED ORDER — ONDANSETRON HCL 4 MG/2ML IJ SOLN
4.0000 mg | Freq: Once | INTRAMUSCULAR | Status: AC
  Administered 2017-12-23: 4 mg via INTRAVENOUS
  Filled 2017-12-23: qty 2

## 2017-12-23 MED ORDER — FAMOTIDINE IN NACL 20-0.9 MG/50ML-% IV SOLN
20.0000 mg | Freq: Once | INTRAVENOUS | Status: AC
  Administered 2017-12-23: 20 mg via INTRAVENOUS
  Filled 2017-12-23: qty 50

## 2017-12-23 NOTE — ED Provider Notes (Signed)
[facility] EMERGENCY DEPARTMENT Provider Note   CSN: [id number] Arrival date & time: 12/23/17  1159     History   Chief Complaint Chief Complaint  Patient presents with  . Abdominal Pain  . Back Pain    HPI [name] is a 47 y.o. female.  HPI  [name] is a 47yo female with a history of daily alcohol use, bipolar disorder, CKD, GERD, sleep apnea, sickle cell trait who presents to the emergency department for evaluation of worsening epigastric pain.  Patient reports that she has had intermittent epigastric pain radiating to the back for the past 7 months.  She has been seen in the emergency department several times and prescribed Nexium and Carafate.  Despite taking these medications her symptoms have not improved.  This morning she reports her pain is worse than it has been in the past, 10/10 in severity and feels like a spasm in her epigastrium which radiates directly to her middle back.  Pain comes and goes without apparent trigger.  No alleviating factors.  She had one episode of nonbloody emesis today.  She had a normal bowel movement yesterday, no hematochezia or melena.  She denies fevers, chills, dysuria, urinary frequency, vaginal bleeding, vaginal discharge, lightheadedness, syncope, chest pain, shortness of breath, cough.  She has an appointment scheduled with the gastroenterologist 01/14/2018.  She reports drinking about 1/5 of liquor daily, last used 2 days ago.  She has had a prior C-section, no other abdominal surgeries.  Past Medical History:  Diagnosis Date  . Anxiety   . Blood transfusion    1989 at [place]  . Bronchitis   . Chronic bipolar disorder ([place])   . Chronic kidney disease   . Depression    bipolar  . GERD (gastroesophageal reflux disease)   . Headache(784.0)    occasional  . Mental disorder    bipolar  . Pyelonephritis 10/2010  . Recurrent upper respiratory infection (URI)    states she has not been to MD and  feels like she has  bronchitis now- greenish sputum  . Shortness of breath    sometimes with exertion  . Sickle cell trait ([place])   . Sleep apnea    CPAP  . UTI (lower urinary tract infection) 10/2010    Patient Active Problem List   Diagnosis Date Noted  . Fracture of tibial plateau, closed 03/08/2011  . GERD (gastroesophageal reflux disease) 03/08/2011  . Bipolar disorder ([place]) 03/08/2011  . OSA (obstructive sleep apnea) 03/08/2011  . Obesity 03/08/2011  . Nicotine dependence 03/08/2011    Past Surgical History:  Procedure Laterality Date  . CESAREAN SECTION    . CESAREAN SECTION    . KNEE ARTHROSCOPY  06/15/2011   Procedure: ARTHROSCOPY KNEE;  Surgeon: Rozanna Box, MD;  Location: [facility];  Service: Orthopedics;  Laterality: Left;  LEFT KNEE SCOPE WITH LYSIS OF ADHESIONS AND MANIPULATION  . ORIF TIBIA PLATEAU  03/08/2011   Procedure: OPEN REDUCTION INTERNAL FIXATION (ORIF) TIBIAL PLATEAU;  Surgeon: Rozanna Box;  Location: [facility];  Service: Orthopedics;  Laterality: Left;  . TUBAL LIGATION       OB History   None      Home Medications    Prior to Admission medications   Medication Sig Start Date End Date Taking? Authorizing Provider  hydrochlorothiazide (HYDRODIURIL) 12.5 MG tablet Take 1 tablet (12.5 mg total) by mouth daily. 10/22/17   Clent Demark, PA-C  ondansetron (ZOFRAN ODT) 4 MG  disintegrating tablet 4mg  ODT q4 hours prn nausea/vomit 12/05/17   Jacqlyn Larsen, PA-C  sucralfate (CARAFATE) 1 GM/10ML suspension Take 10 mLs (1 g total) by mouth 4 (four) times daily -  with meals and at bedtime. 12/05/17   Jacqlyn Larsen, PA-C  varenicline (CHANTIX CONTINUING MONTH PAK) 1 MG tablet Take 1 tablet (1 mg total) by mouth 2 (two) times daily. 12/03/17   Clent Demark, PA-C  varenicline (CHANTIX STARTING MONTH PAK) 0.5 MG X 11 & 1 MG X 42 tablet Take one 0.5 mg tablet by mouth once daily for 3 days, then increase to one 0.5 mg tablet twice daily for 4 days, then  increase to one 1 mg tablet twice daily. 12/03/17   Clent Demark, PA-C    Family History Family History  Problem Relation Age of Onset  . Thyroid disease Father   . Diabetes Other   . Cancer Other   . Anesthesia problems Neg Hx     Social History Social History   Tobacco Use  . Smoking status: Current Every Day Smoker    Packs/day: 1.50    Years: 24.00    Pack years: 36.00    Types: Cigarettes  . Smokeless tobacco: Never Used  Substance Use Topics  . Alcohol use: Yes    Comment: drinks liquor everyday  . Drug use: Yes    Types: "Crack" cocaine    Comment: none since 2000     Allergies   Aspirin; Banana; Latex; Penicillins; Septra [bactrim]; Shellfish allergy; Strawberry extract; and Tape   Review of Systems Review of Systems  Constitutional: Negative for chills and fever.  Respiratory: Negative for cough and shortness of breath.   Cardiovascular: Negative for chest pain.  Gastrointestinal: Positive for abdominal pain, nausea and vomiting. Negative for blood in stool and diarrhea.  Genitourinary: Negative for difficulty urinating, dysuria, flank pain, frequency, vaginal bleeding and vaginal discharge.  Musculoskeletal: Positive for back pain.  Skin: Negative for color change and rash.  Neurological: Negative for syncope, weakness, light-headedness and numbness.  Psychiatric/Behavioral: Negative for agitation.  All other systems reviewed and are negative.    Physical Exam Updated Vital Signs BP (!) 149/102 (BP Location: Right Arm)   Pulse 87   Temp 98.8 F (37.1 C) (Oral)   Resp 16   LMP 12/22/2017   SpO2 100%   Physical Exam  Constitutional: She is oriented to person, place, and time. She appears well-developed and well-nourished. No distress.  Appears uncomfortable, grabbing her epigastrium and crying.   HENT:  Head: Normocephalic and atraumatic.  Mouth/Throat: Oropharynx is clear and moist.  Eyes: Right eye exhibits no discharge. Left eye  exhibits no discharge.  Neck: Normal range of motion.  Cardiovascular: Normal rate and regular rhythm.  Pulmonary/Chest: Effort normal and breath sounds normal. No stridor. No respiratory distress. She has no wheezes. She has no rales.  Abdominal:  Abdomen soft and nondistended.  Bowel sounds normoactive.  Acutely tender to palpation in the upper abdomen including right upper quadrant, epigastrium and left upper quadrant.  No guarding or rigidity.  No rebound tenderness.  Musculoskeletal:  Tender to palpation over right paraspinal muscles of the thoracic spine.  No overlying rash on the skin.  Neurological: She is alert and oriented to person, place, and time. Coordination normal.  Skin: Skin is warm and dry. She is not diaphoretic.  Psychiatric: She has a normal mood and affect. Her behavior is normal.  Nursing note and vitals reviewed.  ED Treatments / Results  Labs (all labs ordered are listed, but only abnormal results are displayed) Labs Reviewed  LIPASE, BLOOD - Abnormal; Notable for the following components:      Result Value   Lipase 72 (*)    All other components within normal limits  COMPREHENSIVE METABOLIC PANEL - Abnormal; Notable for the following components:   Glucose, Bld 124 (*)    BUN <5 (*)    Albumin 3.4 (*)    All other components within normal limits  CBC - Abnormal; Notable for the following components:   Hemoglobin 11.0 (*)    MCH 25.5 (*)    RDW 21.2 (*)    Platelets 463 (*)    All other components within normal limits  URINALYSIS, ROUTINE W REFLEX MICROSCOPIC - Abnormal; Notable for the following components:   Hgb urine dipstick MODERATE (*)    Bacteria, UA RARE (*)    All other components within normal limits  I-STAT BETA HCG BLOOD, ED ([facility], [facility], [facility] ONLY)  I-STAT CG4 LACTIC ACID, ED    EKG EKG Interpretation  Date/Time:  Monday December 23 2017 12:17:03 EDT Ventricular Rate:  88 PR Interval:  96 QRS Duration: 70 QT Interval:  352 QTC  Calculation: 425 R Axis:   77 Text Interpretation:  Sinus rhythm with short PR Nonspecific T wave abnormality Abnormal ECG Confirmed by Sherwood Gambler [phone]) on 12/23/2017 2:28:38 PM   Radiology No results found.  Procedures Procedures (including critical care time)  Medications Ordered in ED Medications  morphine 4 MG/ML injection 4 mg (has no administration in time range)  famotidine (PEPCID) IVPB 20 mg premix (has no administration in time range)  HYDROmorphone (DILAUDID) injection 1 mg (1 mg Intravenous Given 12/23/17 1530)  ondansetron (ZOFRAN) injection 4 mg (4 mg Intravenous Given 12/23/17 1528)  sodium chloride 0.9 % bolus 1,000 mL (1,000 mLs Intravenous New Bag/Given 12/23/17 1532)     Initial Impression / Assessment and Plan / ED Course  I have reviewed the triage vital signs and the nursing notes.  Pertinent labs & imaging results that were available during my care of the patient were reviewed by me and considered in my medical decision making (see chart for details).     Patient with a history of chronic alcohol abuse and GERD presents with severe epigastric pain radiating to the back. Has a history of this in the past with negative work ups, scheduled to see GI early next month.   On exam she is crying out in pain and holding her epigastrium. She is significantly tender to palpation here. Acute abdomen film negative for free air or acute abnormality. Otherwise CBC without leukocytosis. Hemoglobin 11.0 which is baseline, doubt acute GI bleed. CMP unremarkable, liver enzymes and creatinine within normal. Lactic acid negative, doubt acute mesenteric ischemia. Lipase is mildly elevated at 72. It is possible that she had a pancreatitis flare given her daily alcohol use. No elevation in liver enzymes and I doubt biliary stone. Plan to get another CT abdomen/pelvis scan given significant pain even after IV dilaudid, antiemetic and fluids.   CT abdomen/pelvis scan consistent  with acute pancreatitis involving the head of pancreas. No evidence for necrosis or organized fluid collection. On recheck patient tolerating po fluids at the bedside and reports improvement in pain. Plan to discharge patient with antiemetic, pain medication and instructions on clear liquid diet. We had a long discussion about alcohol use contributing to this and she reports interest in abstaining from alcohol  use. I counseled her to follow up with GI as scheduled and we discussed reasons to return to the ED sooner. She appears reliable.    Final Clinical Impressions(s) / ED Diagnoses   Final diagnoses:  Alcohol-induced acute pancreatitis without infection or necrosis    ED Discharge Orders         Ordered    ondansetron (ZOFRAN ODT) 4 MG disintegrating tablet  Every 8 hours PRN     12/23/17 2031    oxyCODONE-acetaminophen (PERCOCET/ROXICET) 5-325 MG tablet  Every 6 hours PRN     12/23/17 2031           Glyn Ade, PA-C 12/23/17 2033    Fredia Sorrow, MD 12/24/17 0210

## 2017-12-23 NOTE — ED Notes (Signed)
Patient transported to X-ray 

## 2017-12-23 NOTE — ED Triage Notes (Signed)
Pt here for reeval of abdominal pain radiating to the back. She has been dealing with this for 7 months, no relief with meds. This episode got worse yesterday. Pt crying. No N/V/D.

## 2017-12-23 NOTE — ED Notes (Signed)
Pt in CT.

## 2017-12-23 NOTE — ED Notes (Signed)
Patient verbalizes understanding of discharge instructions. Opportunity for questioning and answers were provided. Armband removed by staff, pt discharged from ED home via POV.  

## 2017-12-23 NOTE — ED Notes (Signed)
Pt requesting food and then fell asleep.

## 2017-12-23 NOTE — ED Notes (Signed)
ED Provider at bedside. 

## 2017-12-23 NOTE — ED Notes (Addendum)
Pt crying out in pain, heat pack given for comfort, emesis bag given for nausea/dry heaving, waiting for EDP

## 2017-12-23 NOTE — Discharge Instructions (Signed)
The scan shows that you have pancreatitis.  I renew prescription for medicine to help with pain, over this medicine can make you sleepy so please do not drive or work while taking it.  He can take Zofran as needed for nausea.  It is important to drink lots of clear liquids for the next 48 hours.  After this time you can start slowly introducing foods back into your diet.  Follow-up with the GI doctor as is scheduled.  Stay away from alcohol as this will make your symptoms worse.  Come back to the ER if you have any new or concerning symptoms like vomiting that will not stop, fever.

## 2017-12-26 ENCOUNTER — Other Ambulatory Visit (INDEPENDENT_AMBULATORY_CARE_PROVIDER_SITE_OTHER): Payer: Self-pay

## 2017-12-26 ENCOUNTER — Ambulatory Visit (INDEPENDENT_AMBULATORY_CARE_PROVIDER_SITE_OTHER): Payer: Self-pay | Admitting: Physician Assistant

## 2017-12-26 ENCOUNTER — Encounter (INDEPENDENT_AMBULATORY_CARE_PROVIDER_SITE_OTHER): Payer: Self-pay | Admitting: Physician Assistant

## 2017-12-26 DIAGNOSIS — M1712 Unilateral primary osteoarthritis, left knee: Secondary | ICD-10-CM

## 2017-12-26 DIAGNOSIS — M25561 Pain in right knee: Secondary | ICD-10-CM

## 2017-12-26 DIAGNOSIS — M1611 Unilateral primary osteoarthritis, right hip: Secondary | ICD-10-CM

## 2017-12-26 DIAGNOSIS — M25562 Pain in left knee: Principal | ICD-10-CM

## 2017-12-26 MED ORDER — DICLOFENAC SODIUM 1 % TD GEL: 4.0000 g | Freq: Four times a day (QID) | TRANSDERMAL | 1 refills | Status: DC

## 2017-12-26 NOTE — Progress Notes (Signed)
HPI: [name] returns today follow-up bilateral knee pain status post bilateral cortisone injections.  She states that her knee pain is improved.  She feels she is about 30 to 40% better.  Still having left greater than right knee pain.  States left knee gives way at times.  She is been doing home exercise program as taught.  Review of systems: See HPI otherwise negative  Physical exam: Bilateral knee full extension full flexion.  She has tenderness along medial joint line of both knees.  No instability valgus varus stressing.  Significant patellofemoral crepitus bilateral knees with range of motion.  No effusion abnormal warmth erythema of either knee.  Impression: Bilateral knee osteoarthritis  Plan: I discussed with her I feel that most of her pain is actually coming from her patellofemoral joint and she benefit from physical therapy to work on quad Scientist, product/process development.  We will also place her on Voltaren gel 4 g 4 times daily to both knees.  Like to stay away from any oral anti-inflammatories due to her alcohol abuse.  Questions were encouraged and answered at length.  Follow-up in 1 month.  May consider supplemental injection in both knees.

## 2017-12-31 ENCOUNTER — Telehealth (INDEPENDENT_AMBULATORY_CARE_PROVIDER_SITE_OTHER): Payer: Self-pay | Admitting: Physician Assistant

## 2017-12-31 MED FILL — HYDROCHLOROTHIAZIDE 12.5 MG: 12.5 | 30 days supply | Qty: 30 | Fill #1

## 2017-12-31 MED FILL — ?ESOMEPRAZOLE MAG DR 40MG C: 40 | 30 days supply | Qty: 30 | Fill #1

## 2017-12-31 NOTE — Telephone Encounter (Signed)
Patient called and stated that she was supposed to have  diclofenac sodium (VOLTAREN) 1 % GEL Called I to [facility].  Please cal patient to advise.

## 2018-01-01 ENCOUNTER — Ambulatory Visit (INDEPENDENT_AMBULATORY_CARE_PROVIDER_SITE_OTHER): Payer: Self-pay | Admitting: Physician Assistant

## 2018-01-01 ENCOUNTER — Encounter (INDEPENDENT_AMBULATORY_CARE_PROVIDER_SITE_OTHER): Payer: Self-pay

## 2018-01-01 MED FILL — $CHANTIX 1 MG CONT MONTH BO: 1 | 84 days supply | Qty: 168 | Fill #0

## 2018-01-01 MED FILL — $CHANTIX STARTING MONTH BOX: 0.5 MG X 11 | 30 days supply | Qty: 53 | Fill #0

## 2018-01-02 ENCOUNTER — Other Ambulatory Visit (INDEPENDENT_AMBULATORY_CARE_PROVIDER_SITE_OTHER): Payer: Self-pay

## 2018-01-02 DIAGNOSIS — M1611 Unilateral primary osteoarthritis, right hip: Secondary | ICD-10-CM

## 2018-01-02 DIAGNOSIS — M1712 Unilateral primary osteoarthritis, left knee: Secondary | ICD-10-CM

## 2018-01-02 MED ORDER — DICLOFENAC SODIUM 1 % TD GEL: 4.0000 g | Freq: Four times a day (QID) | TRANSDERMAL | 1 refills | Status: DC

## 2018-01-02 NOTE — Telephone Encounter (Signed)
Sent in to pharmacy.  

## 2018-01-02 NOTE — Telephone Encounter (Signed)
Please send in for her

## 2018-01-03 ENCOUNTER — Ambulatory Visit: Payer: Self-pay | Admitting: Physical Therapy

## 2018-01-08 ENCOUNTER — Other Ambulatory Visit (INDEPENDENT_AMBULATORY_CARE_PROVIDER_SITE_OTHER): Payer: Self-pay

## 2018-01-08 DIAGNOSIS — M1712 Unilateral primary osteoarthritis, left knee: Secondary | ICD-10-CM

## 2018-01-08 DIAGNOSIS — M1611 Unilateral primary osteoarthritis, right hip: Secondary | ICD-10-CM

## 2018-01-08 MED ORDER — DICLOFENAC SODIUM 1 % TD GEL: 4.0000 g | Freq: Four times a day (QID) | TRANSDERMAL | 1 refills | Status: DC

## 2018-01-08 MED FILL — DICLOFENAC SODIUM 1% GEL: 1 | 12 days supply | Qty: 100 | Fill #0

## 2018-01-14 ENCOUNTER — Encounter: Payer: Self-pay | Admitting: Gastroenterology

## 2018-01-14 ENCOUNTER — Encounter

## 2018-01-14 ENCOUNTER — Ambulatory Visit (INDEPENDENT_AMBULATORY_CARE_PROVIDER_SITE_OTHER): Payer: Self-pay | Admitting: Gastroenterology

## 2018-01-14 VITALS — BP 144/82 | HR 79 | Ht 67.0 in | Wt 224.0 lb

## 2018-01-14 DIAGNOSIS — D649 Anemia, unspecified: Secondary | ICD-10-CM

## 2018-01-14 DIAGNOSIS — D259 Leiomyoma of uterus, unspecified: Secondary | ICD-10-CM

## 2018-01-14 DIAGNOSIS — R739 Hyperglycemia, unspecified: Secondary | ICD-10-CM

## 2018-01-14 DIAGNOSIS — R1310 Dysphagia, unspecified: Secondary | ICD-10-CM

## 2018-01-14 DIAGNOSIS — K920 Hematemesis: Secondary | ICD-10-CM

## 2018-01-14 DIAGNOSIS — K852 Alcohol induced acute pancreatitis without necrosis or infection: Secondary | ICD-10-CM

## 2018-01-14 DIAGNOSIS — R1084 Generalized abdominal pain: Secondary | ICD-10-CM

## 2018-01-14 NOTE — Progress Notes (Addendum)
Referring Provider: Clent Demark, PA-C Primary Care Physician:  Clent Demark, PA-C   Reason for Consultation: Abdominal pain   IMPRESSION:  Abdominal pain x years, worse over the last 7 months Hematemesis Solid-food dysphagia localized to the cervical esophagus Normocytic anemia with known sickle cell trait Acute alcohol associated pancreatitis by CT 12/23/17, lipase 72    - normal liver enzymes Left-sided diverticulosis by CT Hyperglycemia 4.6 cm uterine fibroid  Differential of abdominal pain is broad. Given associated dysphagia and hematemesis, must consider esophagitis. Differential also includes H pylori, gastritis, chronic pancreatitis, IBS, and hyperglycemia.   PLAN: CBC, hepatic function panel, lipase Obtain B12 and folate on future testing Continue Nexium and Carafate EGD Abstain from all alcohol Will ask PA Altamease Oiler to evaluate her hypergycemia - this may be contributing Referral to GYN to evaluate the uterine fibroid  I consented the patient at the bedside today discussing the risks, benefits, and alternatives to endoscopic evaluation. In particular, we discussed the risks that include, but are not limited to, reaction to medication, cardiopulmonary compromise, bleeding requiring blood transfusion, aspiration resulting in pneumonia, perforation requiring surgery, and even death. We reviewed the risk of missed lesion including polyps or even cancer. The patient acknowledges these risks and asks that we proceed.   HPI: [name] is a 47 y.o. female seen in consultation at the request of PA Altamease Oiler for further evaluation of abdominal pain.  The history is obtained through the patient and review of her electronic health record. Her daughter accompanies her to this appointment. She has a history of daily alcohol use, bipolar disorder, CKD, GERD, sleep apnea, bilateral knee pain status post bilateral cortisone injections, and sickle cell trait.   Patient  reports that she has had intermittent epigastric pain radiating to the back for years but worse over the past 7 months. Occurring 3-5 days a week.  She has been seen in the emergency department several times and prescribed Nexium and Carafate.  Frequent nausea and hematemesis. Associated "hunger pain." Continues to have GERD. Despite taking these medications her symptoms have not improved. Pain comes and goes without apparent trigger.  No alleviating factors.  Alternating bowel habits. Poor appetite. Unintentionally weight loss of 50 pounds over the last 6 months.  Despite weight loss, she has been able to wear the same clothes they are just too big.   On Nexium or Prilosec or omeprazole since 2006 for GERD. Inadequately controlled the entire time. Particularly exacerbated by red meat. Symptoms have become more progressive. Now with intermittent dysphagia to solids. Sits in her cervical esophagus. Must drink sips of water to improve her symptoms. No other associated symptoms. No identified exacerbating or relieving features.   Abdominal pain a CT of the abdomen and pelvis with contrast 12/05/2017 to evaluate for acute abdominal pain. It showed diverticulosis in the left colon without diverticulitis. 4.6 cm uterine fibroid and no acute findings.  A CT of the abdomen and pelvis with contrast 12/23/2017 was obtained to evaluate upper abdominal pain with nausea and vomiting this revealed enlargement of the pancreatic head and uncinate process with soft tissue stranding consistent with acute pancreatitis.  There was mild duodenal wall thickening.  No biliary dilatation.  She had labs obtained 12/03/2017, 12/05/2017, and 12/23/2017.  The CMP on 12/03/2017 was normal except for an AST of 71, ALT 41, and glucose of 128.  Lipase was normal at 27.  The CMP was completely normal on 12/05/2017 except for a glucose of 122.  On 12/23/2017 her liver enzymes were normal however her lipase was 72.  Her glucose was 124.  She has  normocytic anemia with hemoglobin of 11, MCV 84.5, and RDW 21.2.    Past Medical History:  Diagnosis Date  . Anxiety   . Blood transfusion    1989 at [place]  . Bronchitis   . Chronic bipolar disorder ([place])   . Chronic kidney disease   . Depression    bipolar  . GERD (gastroesophageal reflux disease)   . Headache(784.0)    occasional  . Mental disorder    bipolar  . Pyelonephritis 10/2010  . Recurrent upper respiratory infection (URI)    states she has not been to MD and feels like she has  bronchitis now- greenish sputum  . Shortness of breath    sometimes with exertion  . Sickle cell trait ([place])   . Sleep apnea    CPAP  . UTI (lower urinary tract infection) 10/2010    Past Surgical History:  Procedure Laterality Date  . CESAREAN SECTION    . CESAREAN SECTION    . KNEE ARTHROSCOPY  06/15/2011   Procedure: ARTHROSCOPY KNEE;  Surgeon: Rozanna Box, MD;  Location: [facility];  Service: Orthopedics;  Laterality: Left;  LEFT KNEE SCOPE WITH LYSIS OF ADHESIONS AND MANIPULATION  . ORIF TIBIA PLATEAU  03/08/2011   Procedure: OPEN REDUCTION INTERNAL FIXATION (ORIF) TIBIAL PLATEAU;  Surgeon: Rozanna Box;  Location: [facility];  Service: Orthopedics;  Laterality: Left;  . TUBAL LIGATION      Current Outpatient Medications  Medication Sig Dispense Refill  . diclofenac sodium (VOLTAREN) 1 % GEL Apply 4 g topically 4 (four) times daily. 1 Tube 1  . esomeprazole (NEXIUM) 40 MG capsule Take 40 mg by mouth daily at 12 noon.    . hydrochlorothiazide (HYDRODIURIL) 12.5 MG tablet Take 1 tablet (12.5 mg total) by mouth daily. 30 tablet 5  . ondansetron (ZOFRAN ODT) 4 MG disintegrating tablet 4mg  ODT q4 hours prn nausea/vomit (Patient not taking: Reported on 12/23/2017) 10 tablet 0  . ondansetron (ZOFRAN ODT) 4 MG disintegrating tablet Take 1 tablet (4 mg total) by mouth every 8 (eight) hours as needed for nausea or vomiting. 20 tablet 0  . oxyCODONE-acetaminophen (PERCOCET/ROXICET) 5-325 MG  tablet Take 1 tablet by mouth every 6 (six) hours as needed for severe pain. 16 tablet 0  . sucralfate (CARAFATE) 1 GM/10ML suspension Take 10 mLs (1 g total) by mouth 4 (four) times daily -  with meals and at bedtime. (Patient taking differently: Take 0.5 g by mouth 5 (five) times daily. ) 420 mL 0  . varenicline (CHANTIX CONTINUING MONTH PAK) 1 MG tablet Take 1 tablet (1 mg total) by mouth 2 (two) times daily. 60 tablet 3  . varenicline (CHANTIX STARTING MONTH PAK) 0.5 MG X 11 & 1 MG X 42 tablet Take one 0.5 mg tablet by mouth once daily for 3 days, then increase to one 0.5 mg tablet twice daily for 4 days, then increase to one 1 mg tablet twice daily. 53 tablet 0   No current facility-administered medications for this visit.     Allergies as of 01/14/2018 - Review Complete 12/26/2017  Allergen Reaction Noted  . Aspirin Other (See Comments) 01/19/2011  . Banana Hives 03/08/2011  . Latex Hives 03/08/2011  . Penicillins Swelling 01/19/2011  . Septra [bactrim] Itching and Swelling 01/19/2011  . Shellfish allergy Other (See Comments) 03/08/2011  . Strawberry extract Hives 03/08/2011  .  Tape Other (See Comments) 04/19/2013    Family History  Problem Relation Age of Onset  . Thyroid disease Father   . Diabetes Other   . Cancer Other   . Anesthesia problems Neg Hx     Social History   Socioeconomic History  . Marital status: Significant Other    Spouse name: Not on file  . Number of children: Not on file  . Years of education: Not on file  . Highest education level: Not on file  Occupational History  . Not on file  Social Needs  . Financial resource strain: Not on file  . Food insecurity:    Worry: Not on file    Inability: Not on file  . Transportation needs:    Medical: Not on file    Non-medical: Not on file  Tobacco Use  . Smoking status: Current Every Day Smoker    Packs/day: 1.50    Years: 24.00    Pack years: 36.00    Types: Cigarettes  . Smokeless tobacco: Never  Used  Substance and Sexual Activity  . Alcohol use: Yes    Comment: drinks liquor everyday  . Drug use: Yes    Types: "Crack" cocaine    Comment: none since 2000  . Sexual activity: Not on file  Lifestyle  . Physical activity:    Days per week: Not on file    Minutes per session: Not on file  . Stress: Not on file  Relationships  . Social connections:    Talks on phone: Not on file    Gets together: Not on file    Attends religious service: Not on file    Active member of club or organization: Not on file    Attends meetings of clubs or organizations: Not on file    Relationship status: Not on file  . Intimate partner violence:    Fear of current or ex partner: Not on file    Emotionally abused: Not on file    Physically abused: Not on file    Forced sexual activity: Not on file  Other Topics Concern  . Not on file  Social History Narrative  . Not on file    Review of Systems: 12 system ROS is negative except as noted above with the additions of: Heavy menses for 5-7 days with clots and cramping, vision changes, depression, headaches,  Physical Exam: Vital signs were reviewed. General:   Alert, well-nourished, pleasant and cooperative in NAD Head:  Normocephalic and atraumatic. Eyes:  Sclera clear, no icterus.   Conjunctiva pink. Mouth:  No deformity or lesions.   Neck:  Supple; no thyromegaly. Lungs:  Clear throughout to auscultation.   No wheezes.  Heart:  Regular rate and rhythm; no murmurs Abdomen:  Soft, nontender, normal bowel sounds. No rebound or guarding. No hepatosplenomegaly Rectal:  Deferred  Msk:  Symmetrical without gross deformities. Extremities:  No gross deformities or edema. Neurologic:  Alert and  oriented x4;  grossly nonfocal Skin:  No rash or bruise. Psych:  Alert and cooperative. Normal mood and affect.   [name] L. Tarri Glenn Md, MPH [facility] 01/14/2018, 8:32 AM

## 2018-01-14 NOTE — Patient Instructions (Signed)
Your provider has requested that you go to the basement level for lab work before leaving today. Press "B" on the elevator. The lab is located at the first door on the left as you exit the elevator.  Continue Nexium and Carafate.   You have been scheduled for an endoscopy. Please follow written instructions given to you at your visit today. If you use inhalers (even only as needed), please bring them with you on the day of your procedure. Your physician has requested that you go to www.startemmi.com and enter the access code given to you at your visit today. This web site gives a general overview about your procedure. However, you should still follow specific instructions given to you by our office regarding your preparation for the procedure.  Abstain from alcohol.   Will refer you to GYN to evaulate uterine fibroid.

## 2018-01-15 ENCOUNTER — Ambulatory Visit: Payer: Self-pay | Attending: Physician Assistant

## 2018-01-15 ENCOUNTER — Other Ambulatory Visit: Payer: Self-pay

## 2018-01-15 DIAGNOSIS — M25561 Pain in right knee: Secondary | ICD-10-CM | POA: Insufficient documentation

## 2018-01-15 DIAGNOSIS — G8929 Other chronic pain: Secondary | ICD-10-CM | POA: Insufficient documentation

## 2018-01-15 DIAGNOSIS — M25562 Pain in left knee: Secondary | ICD-10-CM | POA: Insufficient documentation

## 2018-01-15 DIAGNOSIS — R262 Difficulty in walking, not elsewhere classified: Secondary | ICD-10-CM | POA: Insufficient documentation

## 2018-01-15 NOTE — Patient Instructions (Signed)
Supine Active Straight Leg Raise   reps: 10 sets: 1   daily: 2 weekly: 7  Supine Knee Extension Strengthening   reps: 10 sets: 1   hold: 5 daily: 2   weekly: 7   Proper Sit to Stand Technique   reps: 3 sets: 1   daily: 2 weekly: 7

## 2018-01-15 NOTE — Therapy (Signed)
[place], [place], [address] Phone: [phone]   Fax:  [phone]  Physical Therapy Evaluation  Patient Details  Name: [name] MRN: [id number] Date of Birth: Oct 19, 1970 Referring Provider (PT): Erskine Emery, [place]   Encounter Date: 01/15/2018  PT End of Session - 01/15/18 1555    Visit Number  1    Number of Visits  8    Date for PT Re-Evaluation  02/21/18    Authorization Type  [place] [employer] assist 100% per pt    PT Start Time  1010    PT Stop Time  1105    PT Time Calculation (min)  55 min    Activity Tolerance  Patient tolerated treatment well    Behavior During Therapy  [facility] for tasks assessed/performed       Past Medical History:  Diagnosis Date  . Alcoholism ([place])   . Anxiety   . Arthritis   . Asthma   . Blood transfusion    1989 at [place]  . Bronchitis   . Chronic bipolar disorder ([place])   . Chronic kidney disease   . Depression    bipolar  . GERD (gastroesophageal reflux disease)   . Headache(784.0)    occasional  . Hypertension   . Mental disorder    bipolar  . Pancreatitis   . Pyelonephritis 10/2010  . Recurrent upper respiratory infection (URI)    states she has not been to MD and feels like she has  bronchitis now- greenish sputum  . Shortness of breath    sometimes with exertion  . Sickle cell trait ([place])   . Sleep apnea    CPAP  . UTI (lower urinary tract infection) 10/2010    Past Surgical History:  Procedure Laterality Date  . CESAREAN SECTION    . KNEE ARTHROSCOPY  06/15/2011   Procedure: ARTHROSCOPY KNEE;  Surgeon: Rozanna Box, MD;  Location: [facility];  Service: Orthopedics;  Laterality: Left;  LEFT KNEE SCOPE WITH LYSIS OF ADHESIONS AND MANIPULATION  . ORIF TIBIA PLATEAU  03/08/2011   Procedure: OPEN REDUCTION INTERNAL FIXATION (ORIF) TIBIAL PLATEAU;  Surgeon: Rozanna Box;  Location: [facility];  Service: Orthopedics;  Laterality: Left;  .  TUBAL LIGATION      There were no vitals filed for this visit.   Subjective Assessment - 01/15/18 0955    Subjective  Patient reports has rod in L leg (tibia),  arthritis in both knees and spasms for about6-7 months.  Arthritis maybe 4 years.      Limitations  Walking;Standing;Sitting    How long can you sit comfortably?  30 min    How long can you stand comfortably?  1 hour    How long can you walk comfortably?  1 hour    Diagnostic tests  had x rays last month (Dr. 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  Yes    Pain Score  8     Pain Location  Knee    Pain Orientation  Right;Left    Pain Descriptors / Indicators  Throbbing    Pain Type  Chronic pain    Pain Onset  More than a month ago    Pain Frequency  Intermittent    Aggravating Factors   standing, or sitting  too long, stairs (has to go up sideways)    Pain Relieving Factors  limiting standing at work, Investment banker, operational down    Effect of Pain on Daily Activities  limits work and daily activities         [facility] PT Assessment - 01/15/18 0001      Assessment   Medical Diagnosis  pain in both knees    Referring Provider (PT)  Erskine Emery, PA    Onset Date/Surgical Date  06/03/17    Next MD Visit  01/23/2018    Prior Therapy  2012      Restrictions   Weight Bearing Restrictions  No      Balance Screen   Has the patient fallen in the past 6 months  Yes   fell 2 weeks ago dizziness   How many times?  2   once dizzy, once on stairs   Has the patient had a decrease in activity level because of a fear of falling?   No    Is the patient reluctant to leave their home because of a fear of falling?   Yes   afraid on wet surfaces despite nonslip shoes at work     [place] residence    Living Arrangements  Non-relatives/Friends    Type of [place]  to enter    Entrance Stairs-Number of Steps  3    [place]  One level      Prior Function   Level of Independence  Independent    Vocation  Part time employment    Vocation Requirements  works fast food standing    Leisure  watch TV      Cognition   Overall Cognitive Status  Within Functional Limits for tasks assessed      Observation/Other Assessments   Focus on Therapeutic Outcomes (FOTO)   65% limited      Sensation   Light Touch  Appears Intact      Coordination   Gross Motor Movements are Fluid and Coordinated  Yes      Functional Tests   Functional tests  Single leg stance;Sit to Stand      Single Leg Stance   Comments  unable to attempt without UE support      Sit to Stand   Comments  increased time, comes up through rotated method with UE support on armrests and increased time      ROM / Strength   AROM / PROM / Strength  AROM;Strength      AROM   Right Knee Extension  0    Right Knee Flexion  140    Left Knee Extension  0    Left Knee Flexion  133      Strength   Right Hip Flexion  4-/5    Right Hip Extension  4/5    Right Hip ABduction  4+/5    Right Hip ADduction  4+/5    Left Hip Flexion  4/5    Left Hip Extension  4/5    Left Hip ABduction  3+/5    Right Knee Flexion  4/5    Right Knee Extension  4/5    Left Knee Flexion  4-/5    Left Knee Extension  4-/5    Right Ankle Dorsiflexion  4-/5    Right Ankle Plantar Flexion  4-/5  Left Ankle Dorsiflexion  4-/5    Left Ankle Plantar Flexion  3+/5      Flexibility   Soft Tissue Assessment /Muscle Length  yes    ITB  tight on L      Palpation   Patella mobility  WNL R, tight on L; crepitus at terminal extension    Palpation comment  TTP over posterior aspect of knee, along medial>lateral joint line and over L knee more than R; palpable and audible crepitus L>R with terminal extension, though patellar tracking seems WNL      Special Tests    Special  Tests  Knee Special Tests    Knee Special tests   other      other    Findings  Negative    Side   Right;Left    Comments  ligamentous stability intact with varus/valgus and ant/post drawer      Ambulation/Gait   Gait Comments  slow, lumbering with increased BOS                Objective measurements completed on examination: See above findings.      [place] [facility] PT Treatment/Exercise - 01/15/18 0001      Knee/Hip Exercises: Seated   Sit to Sand  5 reps   increased time, painful and audible crepitus.     Knee/Hip Exercises: Supine   Short Arc Quad Sets  Strengthening;Both;10 reps    Straight Leg Raises  Strengthening;Both;10 reps      Modalities   Modalities  Cryotherapy;Moist Heat      Moist Heat Therapy   Number Minutes Moist Heat  15 Minutes    Moist Heat Location  Lumbar Spine      Cryotherapy   Number Minutes Cryotherapy  15 Minutes    Cryotherapy Location  Knee   bilateral   Type of Cryotherapy  Ice pack             PT Education - 01/15/18 1554    Education Details  HEP, balance rest and activity with arthritic conditions, imbalance of hamstrings and quads, POC    Person(s) Educated  Patient    Methods  Explanation;Demonstration;Handout;Verbal cues    Comprehension  Verbalized understanding;Need further instruction          PT Long Term Goals - 01/15/18 1605      PT LONG TERM GOAL #1   Title  Patient will stand for 2 hours without increased pain to improve tolerance to work tasks.    Time  4    Period  Weeks    Status  New    Target Date  02/21/18      PT LONG TERM GOAL #2   Title  Patient will demonstrate quad strength at least 4/5 bilateral for improved gait/stair negotiation and less fall risk.    Time  4    Period  Weeks    Status  New    Target Date  02/21/18      PT LONG TERM GOAL #3   Title  Patient will demonstrate 50% limitation on FOTO    Time  4    Period  Weeks    Status  New    Target Date  02/21/18      PT  LONG TERM GOAL #4   Title  Patient independent in HEp for balance/strength and flexibility.    Time  4    Period  Weeks    Status  New    Target Date  02/21/18             Plan - 01/15/18 1557    Clinical Impression Statement  Patient presents with chronic knee pain with history of L tibial fracture with rod and ?pins.  She demonstrates decreased strength, decreased activity tolerance including standing for work and has had 2 recent falls.  She will benefit from skilled PT to address issues and help with pt's goal to stand longer for improved work abilities.      History and Personal Factors relevant to plan of care:  L tibial fracture with "rod and pins"    Clinical Presentation  Evolving    Clinical Presentation due to:  worsening pain over 6 months and 2 recent falls    Clinical Decision Making  Moderate    Rehab Potential  Good    PT Frequency  2x / week    PT Duration  4 weeks    PT Treatment/Interventions  ADLs/Self Care Home Management;Gait training;Functional mobility training;Cryotherapy;Lobbyist;Therapeutic exercise;Patient/family education;Manual techniques;Passive range of motion;Taping;Vasopneumatic Device;Moist Heat    PT Next Visit Plan  Review HEP, add hip stretching, for L ITB, L ankle strengthening for PF, manual and modalities PRN    PT Home Exercise Plan  SAQ, SLR, sit to stand    Consulted and Agree with Plan of Care  Patient       Patient will benefit from skilled therapeutic intervention in order to improve the following deficits and impairments:  Abnormal gait, Decreased activity tolerance, Decreased balance, Decreased mobility, Pain, Decreased strength, Impaired flexibility  Visit Diagnosis: Chronic pain of left knee - Plan: PT plan of care cert/re-cert  Chronic pain of right knee - Plan: PT plan of care cert/re-cert  Difficulty in walking, not elsewhere classified - Plan: PT plan of care cert/re-cert     Problem  List Patient Active Problem List   Diagnosis Date Noted  . Fracture of tibial plateau, closed 03/08/2011  . GERD (gastroesophageal reflux disease) 03/08/2011  . Bipolar disorder ([place]) 03/08/2011  . OSA (obstructive sleep apnea) 03/08/2011  . Obesity 03/08/2011  . Nicotine dependence 03/08/2011    Reginia Naas, PT 01/15/2018, 4:16 PM  [facility] [address] [place], [place], [address] Phone: [phone]   Fax:  [phone]  Name: [name] MRN: [id number] Date of Birth: 13-Sep-1970

## 2018-01-17 ENCOUNTER — Encounter (INDEPENDENT_AMBULATORY_CARE_PROVIDER_SITE_OTHER): Payer: Self-pay | Admitting: Physician Assistant

## 2018-01-17 ENCOUNTER — Ambulatory Visit (INDEPENDENT_AMBULATORY_CARE_PROVIDER_SITE_OTHER): Payer: Self-pay | Admitting: Physician Assistant

## 2018-01-17 ENCOUNTER — Other Ambulatory Visit: Payer: Self-pay

## 2018-01-17 VITALS — BP 136/86 | HR 78 | Temp 97.9°F | Ht 67.0 in | Wt 225.6 lb

## 2018-01-17 DIAGNOSIS — I10 Essential (primary) hypertension: Secondary | ICD-10-CM

## 2018-01-17 DIAGNOSIS — F1010 Alcohol abuse, uncomplicated: Secondary | ICD-10-CM

## 2018-01-17 DIAGNOSIS — M25561 Pain in right knee: Secondary | ICD-10-CM

## 2018-01-17 DIAGNOSIS — G8929 Other chronic pain: Secondary | ICD-10-CM

## 2018-01-17 DIAGNOSIS — M25562 Pain in left knee: Secondary | ICD-10-CM

## 2018-01-17 DIAGNOSIS — Z131 Encounter for screening for diabetes mellitus: Secondary | ICD-10-CM

## 2018-01-17 LAB — POCT GLYCOSYLATED HEMOGLOBIN (HGB A1C): Hemoglobin A1C: 5.2 % (ref 4.0–5.6)

## 2018-01-17 MED ORDER — ACETAMINOPHEN 500 MG PO TABS: 1000.0000 mg | ORAL_TABLET | Freq: Three times a day (TID) | ORAL | 0 refills | Status: AC | PRN

## 2018-01-17 MED ORDER — AMLODIPINE BESYLATE 5 MG PO TABS: 5.0000 mg | ORAL_TABLET | Freq: Every day | ORAL | 5 refills | Status: DC

## 2018-01-17 MED ORDER — VITAMIN B-1 100 MG PO TABS: 100.0000 mg | ORAL_TABLET | Freq: Every day | ORAL | 1 refills | Status: DC

## 2018-01-17 MED FILL — AMLODIPINE BESYLATE 5 MG TA: 5 | 30 days supply | Qty: 30 | Fill #0

## 2018-01-17 NOTE — Patient Instructions (Signed)
Alcohol Abuse and Nutrition Alcohol abuse is any pattern of alcohol consumption that harms your health, relationships, or work. Alcohol abuse can affect how your body breaks down and absorbs nutrients from food by causing your liver to work abnormally. Additionally, many people who abuse alcohol do not eat enough carbohydrates, protein, fat, vitamins, and minerals. This can cause poor nutrition (malnutrition) and a lack of nutrients (nutrient deficiencies), which can lead to further complications. Nutrients that are commonly lacking (deficient) among people who abuse alcohol include:  Vitamins. ? Vitamin A. This is stored in your liver. It is important for your vision, metabolism, and ability to fight off infections (immunity). ? B vitamins. These include vitamins such as folate, thiamin, and niacin. These are important in new cell growth and maintenance. ? Vitamin C. This plays an important role in iron absorption, wound healing, and immunity. ? Vitamin D. This is produced by your liver, but you can also get vitamin D from food. Vitamin D is necessary for your body to absorb and use calcium.  Minerals. ? Calcium. This is important for your bones and your heart and blood vessel (cardiovascular) function. ? Iron. This is important for blood, muscle, and nervous system functioning. ? Magnesium. This plays an important role in muscle and nerve function, and it helps to control blood sugar and blood pressure. ? Zinc. This is important for the normal function of your nervous system and digestive system (gastrointestinal tract).  Nutrition is an essential component of therapy for alcohol abuse. Your health care provider or dietitian will work with you to design a plan that can help restore nutrients to your body and prevent potential complications. What is my plan? Your dietitian may develop a specific diet plan that is based on your condition and any other complications you may have. A diet plan will  commonly include:  A balanced diet. ? Grains: 6-8 oz per day. ? Vegetables: 2-3 cups per day. ? Fruits: 1-2 cups per day. ? Meat and other protein: 5-6 oz per day. ? Dairy: 2-3 cups per day.  Vitamin and mineral supplements.  What do I need to know about alcohol and nutrition?  Consume foods that are high in antioxidants, such as grapes, berries, nuts, green tea, and dark green and orange vegetables. This can help to counteract some of the stress that is placed on your liver by consuming alcohol.  Avoid food and drinks that are high in fat and sugar. Foods such as sugared soft drinks, salty snack foods, and candy contain empty calories. This means that they lack important nutrients such as protein, fiber, and vitamins.  Eat frequent meals and snacks. Try to eat 5-6 small meals each day.  Eat a variety of fresh fruits and vegetables each day. This will help you get plenty of water, fiber, and vitamins in your diet.  Drink plenty of water and other clear fluids. Try to drink at least 48-64 oz (1.5-2 L) of water per day.  If you are a vegetarian, eat a variety of protein-rich foods. Pair whole grains with plant-based proteins at meals and snacks to obtain the greatest nutrient benefit from your food. For example, eat rice with beans, put peanut butter on whole-grain toast, or eat oatmeal with sunflower seeds.  Soak beans and whole grains overnight before cooking. This can help your body to absorb the nutrients more easily.  Include foods fortified with vitamins and minerals in your diet. Commonly fortified foods include milk, orange juice, cereal, and bread.  If you are malnourished, your dietitian may recommend a high-protein, high-calorie diet. This may include: ? 2,000-3,000 calories (kilocalories) per day. ? 70-100 grams of protein per day.  Your health care provider may recommend a complete nutritional supplement beverage. This can help to restore calories, protein, and vitamins to  your body. Depending on your condition, you may be advised to consume this instead of or in addition to meals.  Limit your intake of caffeine. Replace drinks like coffee and black tea with decaffeinated coffee and herbal tea.  Eat a variety of foods that are high in omega fatty acids. These include fish, nuts and seeds, and soybeans. These foods may help your liver to recover and may also stabilize your mood.  Certain medicines may cause changes in your appetite, taste, and weight. Work with your health care provider and dietitian to make any adjustments to your medicines and diet plan.  Include other healthy lifestyle choices in your daily routine. ? Be physically active. ? Get enough sleep. ? Spend time doing activities that you enjoy.  If you are unable to take in enough food and calories by mouth, your health care provider may recommend a feeding tube. This is a tube that passes through your nose and throat, directly into your stomach. Nutritional supplement beverages can be given to you through the feeding tube to help you get the nutrients you need.  Take vitamin or mineral supplements as recommended by your health care provider. What foods can I eat? Grains Enriched pasta. Enriched rice. Fortified whole-grain bread. Fortified whole-grain cereal. Barley. Brown rice. Quinoa. [place]. Vegetables All fresh, frozen, and canned vegetables. Spinach. Kale. Artichoke. Carrots. Winter squash and pumpkin. Sweet potatoes. Broccoli. Cabbage. Cucumbers. Tomatoes. Sweet peppers. Green beans. Peas. Corn. Fruits All fresh and frozen fruits. Berries. Grapes. Mango. Papaya. Guava. Cherries. Apples. Bananas. Peaches. Plums. Pineapple. Watermelon. Cantaloupe. Oranges. Avocado. Meats and Other Protein Sources Beef liver. Lean beef. Pork. Fresh and canned chicken. Fresh fish. Oysters. Sardines. Canned tuna. Shrimp. Eggs with yolks. Nuts and seeds. Peanut butter. Beans and lentils. Soybeans.  Tofu. Dairy Whole, low-fat, and nonfat milk. Whole, low-fat, and nonfat yogurt. Cottage cheese. Sour cream. Hard and soft cheeses. Beverages Water. Herbal tea. Decaffeinated coffee. Decaffeinated green tea. 100% fruit juice. 100% vegetable juice. Instant breakfast shakes. Condiments Ketchup. Mayonnaise. Mustard. Salad dressing. Barbecue sauce. Sweets and Desserts Sugar-free ice cream. Sugar-free pudding. Sugar-free gelatin. Fats and Oils Butter. Vegetable oil, flaxseed oil, olive oil, and walnut oil. Other Complete nutrition shakes. Protein bars. Sugar-free gum. The items listed above may not be a complete list of recommended foods or beverages. Contact your dietitian for more options. What foods are not recommended? Grains Sugar-sweetened breakfast cereals. Flavored instant oatmeal. Fried breads. Vegetables Breaded or deep-fried vegetables. Fruits Dried fruit with added sugar. Candied fruit. Canned fruit in syrup. Meats and Other Protein Sources Breaded or deep-fried meats. Dairy Flavored milks. Fried cheese curds or fried cheese sticks. Beverages Alcohol. Sugar-sweetened soft drinks. Sugar-sweetened tea. Caffeinated coffee and tea. Condiments Sugar. Honey. Agave nectar. Molasses. Sweets and Desserts Chocolate. Cake. Cookies. Candy. Other Potato chips. Pretzels. Salted nuts. Candied nuts. The items listed above may not be a complete list of foods and beverages to avoid. Contact your dietitian for more information. This information is not intended to replace advice given to you by your health care provider. Make sure you discuss any questions you have with your health care provider. Document Released: 12/14/2004 Document Revised: 06/29/2015 Document Reviewed: 09/22/2013 Elsevier Interactive Patient Education  2018 [employer]  

## 2018-01-17 NOTE — Progress Notes (Signed)
Subjective:  Patient ID: [name], female    DOB: [birth year]  Age: 47 y.o. MRN: [id number]  CC: f/u HTN and alcoholism  HPI [name] a 47 y.o.femalewith a medical history of Anxiety, Bipolar disorder, depression, GERD,sickle cell trait, tobacco use disorder,and sleep apnea presents to f/u on HTN. Pt seen six weeks ago with BP of 141/103 mmHg. Attributed to not having taken her HCTZ that morning but chronic noncompliance was suspected. BP is 136/86 on recheck today. Reportedly taking HCTZ 12.5 mg as directed. Does not endorse CP, palpitations, SOB, HA, LE edema, or GU sxs.     Pt stated she was drinking "a fifth and a half" a day. She was planning to go into an alcohol rehabilitation program at [facility] but never went. Pt has been sober for two and a half weeks. Acquired a job a Biochemist, clinical and the job became stressful and drank again for two days. She has now been sober for four days. She is trying to quit drinking alcohol altogether.         Outpatient Medications Prior to Visit  Medication Sig Dispense Refill  . diclofenac sodium (VOLTAREN) 1 % GEL Apply 4 g topically 4 (four) times daily. 1 Tube 1  . esomeprazole (NEXIUM) 40 MG capsule Take 40 mg by mouth daily at 12 noon.    . hydrochlorothiazide (HYDRODIURIL) 12.5 MG tablet Take 1 tablet (12.5 mg total) by mouth daily. 30 tablet 5  . sucralfate (CARAFATE) 1 GM/10ML suspension Take 10 mLs (1 g total) by mouth 4 (four) times daily -  with meals and at bedtime. (Patient taking differently: Take 0.5 g by mouth 5 (five) times daily. ) 420 mL 0  . varenicline (CHANTIX CONTINUING MONTH PAK) 1 MG tablet Take 1 tablet (1 mg total) by mouth 2 (two) times daily. (Patient not taking: Reported on 01/14/2018) 60 tablet 3  . varenicline (CHANTIX STARTING MONTH PAK) 0.5 MG X 11 & 1 MG X 42 tablet Take one 0.5 mg tablet by mouth once daily for 3 days, then increase to one 0.5 mg tablet twice daily for 4 days, then increase to one 1 mg  tablet twice daily. (Patient not taking: Reported on 01/14/2018) 53 tablet 0  . ondansetron (ZOFRAN ODT) 4 MG disintegrating tablet 4mg  ODT q4 hours prn nausea/vomit 10 tablet 0  . oxyCODONE-acetaminophen (PERCOCET/ROXICET) 5-325 MG tablet Take 1 tablet by mouth every 6 (six) hours as needed for severe pain. 16 tablet 0   No facility-administered medications prior to visit.      ROS Review of Systems  Constitutional: Negative for chills, fever and malaise/fatigue.  Eyes: Negative for blurred vision.  Respiratory: Negative for shortness of breath.   Cardiovascular: Negative for chest pain and palpitations.  Gastrointestinal: Negative for abdominal pain and nausea.  Genitourinary: Negative for dysuria and hematuria.  Musculoskeletal: Positive for joint pain. Negative for myalgias.  Skin: Negative for rash.  Neurological: Negative for tingling and headaches.  Psychiatric/Behavioral: Negative for depression. The patient is not nervous/anxious.     Objective:  BP (!) 154/93 (BP Location: Left Arm, Patient Position: Sitting, Cuff Size: Normal)   Pulse 82   Temp 97.9 F (36.6 C) (Oral)   Ht 5\' 7"  (1.702 m)   Wt 225 lb 9.6 oz (102.3 kg)   LMP 12/22/2017   SpO2 98%   BMI 35.33 kg/m   Vitals:   01/17/18 0916 01/17/18 0920  BP: (!) 154/93 136/86  Pulse: 82 78  Temp: 97.9  F (36.6 C)   SpO2: 98%     Physical Exam  Constitutional: She is oriented to person, place, and time.  Well developed, well nourished, NAD, polite  HENT:  Head: Normocephalic and atraumatic.  Eyes: No scleral icterus.  Neck: Normal range of motion. Neck supple. No thyromegaly present.  Cardiovascular: Normal rate, regular rhythm and normal heart sounds.  Pulmonary/Chest: Effort normal and breath sounds normal.  Musculoskeletal: She exhibits no edema.  Neurological: She is alert and oriented to person, place, and time.  Skin: Skin is warm and dry. No rash noted. No erythema. No pallor.  Psychiatric: She  has a normal mood and affect. Her behavior is normal. Thought content normal.  Vitals reviewed.    Assessment & Plan:   1. Hypertension, unspecified type - Begin amLODipine (NORVASC) 5 MG tablet; Take 1 tablet (5 mg total) by mouth daily.  Dispense: 30 tablet; Refill: 5 - Lipid panel  2. Screening for diabetes mellitus - HgB A1c 5.2% today  3. Alcohol abuse - Begin thiamine (VITAMIN B-1) 100 MG tablet; Take 1 tablet (100 mg total) by mouth daily.  Dispense: 30 tablet; Refill: 1  4. Chronic pain of both knees - Pt managed by orthopedics. Advised to keep - Begin acetaminophen (TYLENOL) 500 MG tablet; Take 2 tablets (1,000 mg total) by mouth every 8 (eight) hours as needed for up to 7 days.  Dispense: 21 tablet; Refill: 0   Meds ordered this encounter  Medications  . amLODipine (NORVASC) 5 MG tablet    Sig: Take 1 tablet (5 mg total) by mouth daily.    Dispense:  30 tablet    Refill:  5    Order Specific Question:   Supervising Provider    Answer:   Charlott Rakes [[id number]]  . thiamine (VITAMIN B-1) 100 MG tablet    Sig: Take 1 tablet (100 mg total) by mouth daily.    Dispense:  30 tablet    Refill:  1    Order Specific Question:   Supervising Provider    Answer:   Charlott Rakes [[id number]]  . acetaminophen (TYLENOL) 500 MG tablet    Sig: Take 2 tablets (1,000 mg total) by mouth every 8 (eight) hours as needed for up to 7 days.    Dispense:  21 tablet    Refill:  0    Order Specific Question:   Supervising Provider    Answer:   Charlott Rakes [[id number]]    Follow-up: 4 months for HTN  Clent Demark PA

## 2018-01-18 LAB — LIPID PANEL
Chol/HDL Ratio: 3.7 ratio (ref 0.0–4.4)
Cholesterol, Total: 192 mg/dL (ref 100–199)
HDL: 52 mg/dL (ref >39)
LDL Calculated: 109 mg/dL — ABNORMAL HIGH (ref 0–99)
Triglycerides: 153 mg/dL — ABNORMAL HIGH (ref 0–149)
VLDL Cholesterol Cal: 31 mg/dL (ref 5–40)

## 2018-01-20 ENCOUNTER — Encounter: Payer: Self-pay | Admitting: Gastroenterology

## 2018-01-20 ENCOUNTER — Ambulatory Visit (AMBULATORY_SURGERY_CENTER): Payer: Self-pay | Admitting: Gastroenterology

## 2018-01-20 VITALS — BP 155/95 | HR 83 | Temp 98.0°F | Resp 15 | Ht 67.0 in | Wt 224.0 lb

## 2018-01-20 DIAGNOSIS — K222 Esophageal obstruction: Secondary | ICD-10-CM

## 2018-01-20 DIAGNOSIS — K2970 Gastritis, unspecified, without bleeding: Secondary | ICD-10-CM

## 2018-01-20 DIAGNOSIS — K449 Diaphragmatic hernia without obstruction or gangrene: Secondary | ICD-10-CM

## 2018-01-20 DIAGNOSIS — R1084 Generalized abdominal pain: Secondary | ICD-10-CM

## 2018-01-20 MED ORDER — SODIUM CHLORIDE 0.9 % IV SOLN: 500.0000 mL | Freq: Once | INTRAVENOUS | Status: DC

## 2018-01-20 NOTE — Patient Instructions (Signed)
Biopsies were taken today from your stomach and small intestine.   YOU HAD AN ENDOSCOPIC PROCEDURE TODAY AT [facility]:   Refer to the procedure report that was given to you for any specific questions about what was found during the examination.  If the procedure report does not answer your questions, please call your gastroenterologist to clarify.  If you requested that your care partner not be given the details of your procedure findings, then the procedure report has been included in a sealed envelope for you to review at your convenience later.  YOU SHOULD EXPECT: Some feelings of bloating in the abdomen. Passage of more gas than usual.  Walking can help get rid of the air that was put into your GI tract during the procedure and reduce the bloating. If you had a lower endoscopy (such as a colonoscopy or flexible sigmoidoscopy) you may notice spotting of blood in your stool or on the toilet paper. If you underwent a bowel prep for your procedure, you may not have a normal bowel movement for a few days.  Please Note:  You might notice some irritation and congestion in your nose or some drainage.  This is from the oxygen used during your procedure.  There is no need for concern and it should clear up in a day or so.  SYMPTOMS TO REPORT IMMEDIATELY:   Following upper endoscopy (EGD)  Vomiting of blood or coffee ground material  New chest pain or pain under the shoulder blades  Painful or persistently difficult swallowing  New shortness of breath  Fever of 100F or higher  Black, tarry-looking stools  For urgent or emergent issues, a gastroenterologist can be reached at any hour by calling [phone].   DIET:  We do recommend a small meal at first, but then you may proceed to your regular diet.  Drink plenty of fluids but you should avoid alcoholic beverages for 24 hours.  ACTIVITY:  You should plan to take it easy for the rest of today and you should NOT DRIVE or use  heavy machinery until tomorrow (because of the sedation medicines used during the test).    FOLLOW UP: Our staff will call the number listed on your records the next business day following your procedure to check on you and address any questions or concerns that you may have regarding the information given to you following your procedure. If we do not reach you, we will leave a message.  However, if you are feeling well and you are not experiencing any problems, there is no need to return our call.  We will assume that you have returned to your regular daily activities without incident.  If any biopsies were taken you will be contacted by phone or by letter within the next 1-3 weeks.  Please call us at [phone] if you have not heard about the biopsies in 3 weeks.    SIGNATURES/CONFIDENTIALITY: You and/or your care partner have signed paperwork which will be entered into your electronic medical record.  These signatures attest to the fact that that the information above on your After Visit Summary has been reviewed and is understood.  Full responsibility of the confidentiality of this discharge information lies with you and/or your care-partner.

## 2018-01-20 NOTE — Op Note (Signed)
[facility] Patient Name: [name] Procedure Date: 01/20/2018 1:54 PM MRN: [id number] Endoscopist: Thornton Park MD, MD Age: 47 Referring MD:  Date of Birth: 12-21-1970 Gender: Female Account #: [id number] Procedure:                Upper GI endoscopy Indications:              Generalized abdominal pain, Dysphagia, Hematemesis Medicines:                See the Anesthesia note for documentation of the                            administered medications Procedure:                Pre-Anesthesia Assessment:                           - Prior to the procedure, a History and Physical                            was performed, and patient medications and                            allergies were reviewed. The patient's tolerance of                            previous anesthesia was also reviewed. The risks                            and benefits of the procedure and the sedation                            options and risks were discussed with the patient.                            All questions were answered, and informed consent                            was obtained. Prior Anticoagulants: The patient has                            taken no previous anticoagulant or antiplatelet                            agents. ASA Grade Assessment: III - A patient with                            severe systemic disease. After reviewing the risks                            and benefits, the patient was deemed in                            satisfactory condition to undergo the procedure.  After obtaining informed consent, the endoscope was                            passed under direct vision. Throughout the                            procedure, the patient's blood pressure, pulse, and                            oxygen saturations were monitored continuously. The                            Endoscope was introduced through the mouth, and   advanced to the second part of duodenum. The upper                            GI endoscopy was accomplished without difficulty.                            The patient tolerated the procedure well. Scope In: Scope Out: Findings:                 The examined esophagus was normal. There is a                            widely patent Schatzki's ring.                           A small hiatal hernia is present. Diffuse mild                            inflammation was found in the gastric body.                            Biopsies were taken with a cold forceps for                            histology.                           Diffuse mildly erythematous mucosa without active                            bleeding and with no stigmata of bleeding was found                            in the duodenal bulb. Biopsies were taken with a                            cold forceps for histology.                           The exam was otherwise without abnormality. Complications:            No immediate complications. Estimated Blood Loss:     Estimated blood  loss: none. Impression:               - Normal esophagus except for a widely patent                            Schatzki's ring.                           - Gastritis. Biopsied.                           - Small hiatal hernia.                           - Erythematous duodenopathy. Biopsied.                           - The examination was otherwise normal. Recommendation:           - Resume previous diet today.                           - Continue present medications including Nexium and                            Carafate.                           - Await pathology results.                           - Avoid all NSAIDs.                           - Please stop drinking alcohol. I am concerned that                            alcohol is contributing to your recent symptoms.                           - Return to my office in 1-2 months. Thornton Park MD,  MD 01/20/2018 2:35:41 PM This report has been signed electronically.

## 2018-01-20 NOTE — Progress Notes (Signed)
Called to room to assist during endoscopic procedure.  Patient ID and intended procedure confirmed with present staff. Received instructions for my participation in the procedure from the performing physician.  

## 2018-01-20 NOTE — Progress Notes (Signed)
To PACU, VSS. Report to [email or web address] 

## 2018-01-21 ENCOUNTER — Telehealth: Payer: Self-pay | Admitting: *Deleted

## 2018-01-21 NOTE — Telephone Encounter (Signed)
Left message on f/u call 

## 2018-01-21 NOTE — Telephone Encounter (Signed)
  Follow up Call-  Call back number 01/20/2018  Post procedure Call Back phone  # [phone]  Permission to leave phone message Yes  Some recent data might be hidden     Patient questions:  Do you have a fever, pain , or abdominal swelling? No. Pain Score  0 *  Have you tolerated food without any problems? Yes.    Have you been able to return to your normal activities? Yes.    Do you have any questions about your discharge instructions: Diet   No. Medications  No. Follow up visit  No.  Do you have questions or concerns about your Care? No.  Actions: * If pain score is 4 or above: No action needed, pain <4.

## 2018-01-22 ENCOUNTER — Other Ambulatory Visit (INDEPENDENT_AMBULATORY_CARE_PROVIDER_SITE_OTHER): Payer: Self-pay | Admitting: Physician Assistant

## 2018-01-22 ENCOUNTER — Telehealth (INDEPENDENT_AMBULATORY_CARE_PROVIDER_SITE_OTHER): Payer: Self-pay

## 2018-01-22 DIAGNOSIS — E782 Mixed hyperlipidemia: Secondary | ICD-10-CM

## 2018-01-22 MED ORDER — ATORVASTATIN CALCIUM 40 MG PO TABS: 40.0000 mg | ORAL_TABLET | Freq: Every day | ORAL | 3 refills | Status: DC

## 2018-01-22 MED FILL — ?ATORVASTATIN 40MG TABLET: 40 | 30 days supply | Qty: 30 | Fill #0

## 2018-01-22 NOTE — Telephone Encounter (Signed)
-----   Message from Clent Demark, PA-C sent at 01/22/2018  1:55 PM EST ----- Cholesterol elevated. I have sent atorvastatin to [facility].

## 2018-01-22 NOTE — Telephone Encounter (Signed)
Patient is aware that her cholesterol is elevated and atorvastatin has been sent to [facility]. Nat Christen, CMA

## 2018-01-23 ENCOUNTER — Ambulatory Visit (INDEPENDENT_AMBULATORY_CARE_PROVIDER_SITE_OTHER): Payer: Self-pay | Admitting: Physician Assistant

## 2018-01-24 ENCOUNTER — Encounter

## 2018-01-25 ENCOUNTER — Encounter: Payer: Self-pay | Admitting: Gastroenterology

## 2018-01-27 ENCOUNTER — Ambulatory Visit: Payer: Self-pay

## 2018-02-03 ENCOUNTER — Ambulatory Visit (INDEPENDENT_AMBULATORY_CARE_PROVIDER_SITE_OTHER): Payer: Self-pay | Admitting: Physician Assistant

## 2018-02-03 ENCOUNTER — Ambulatory Visit: Payer: No Typology Code available for payment source | Attending: Physician Assistant

## 2018-02-03 ENCOUNTER — Telehealth: Payer: Self-pay | Admitting: Physical Therapy

## 2018-02-03 DIAGNOSIS — G8929 Other chronic pain: Secondary | ICD-10-CM | POA: Insufficient documentation

## 2018-02-03 DIAGNOSIS — R262 Difficulty in walking, not elsewhere classified: Secondary | ICD-10-CM | POA: Insufficient documentation

## 2018-02-03 DIAGNOSIS — M25561 Pain in right knee: Secondary | ICD-10-CM | POA: Insufficient documentation

## 2018-02-03 DIAGNOSIS — M25562 Pain in left knee: Secondary | ICD-10-CM | POA: Insufficient documentation

## 2018-02-03 NOTE — Telephone Encounter (Signed)
Message left about missed appointment today and that next appointment is 02/05/18 at 3:45 and clinic number left on message twice

## 2018-02-05 ENCOUNTER — Ambulatory Visit: Payer: No Typology Code available for payment source

## 2018-02-10 ENCOUNTER — Ambulatory Visit: Payer: No Typology Code available for payment source | Admitting: Physical Therapy

## 2018-02-11 ENCOUNTER — Telehealth: Payer: Self-pay | Admitting: Physical Therapy

## 2018-02-11 NOTE — Telephone Encounter (Signed)
Message left for [name] that we would cancel all appointments except tomorrow and if she does no come to that  vist will discharge . Clinic number left on the message.

## 2018-02-12 ENCOUNTER — Ambulatory Visit: Payer: No Typology Code available for payment source | Admitting: Physical Therapy

## 2018-02-12 ENCOUNTER — Encounter: Payer: Self-pay | Admitting: Physical Therapy

## 2018-02-12 DIAGNOSIS — M25562 Pain in left knee: Principal | ICD-10-CM

## 2018-02-12 DIAGNOSIS — M25561 Pain in right knee: Secondary | ICD-10-CM

## 2018-02-12 DIAGNOSIS — R262 Difficulty in walking, not elsewhere classified: Secondary | ICD-10-CM

## 2018-02-12 DIAGNOSIS — G8929 Other chronic pain: Secondary | ICD-10-CM

## 2018-02-12 NOTE — Therapy (Addendum)
[place], [place], [address] Phone: [phone]   Fax:  [phone]  Physical Therapy Treatment / Discharge Summary  Patient Details  Name: [name] MRN: [id number] Date of Birth: [birth year] Referring Provider (PT): Erskine Emery, [place]   Encounter Date: 02/12/2018    Past Medical History:  Diagnosis Date  . Alcoholism ([place])   . Anxiety   . Arthritis   . Asthma   . Blood transfusion    1989 at [facility]  . Bronchitis   . Chronic bipolar disorder ([place])   . Chronic kidney disease   . Depression    bipolar  . GERD (gastroesophageal reflux disease)   . Headache(784.0)    occasional  . Hypertension   . Mental disorder    bipolar  . Pancreatitis   . Pyelonephritis 10/2010  . Recurrent upper respiratory infection (URI)    states she has not been to MD and feels like she has  bronchitis now- greenish sputum  . Shortness of breath    sometimes with exertion  . Sickle cell anemia (HCC)    sickle cell trait   . Sickle cell trait ([place])   . Sleep apnea    CPAP  . Substance abuse ([place])    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  . UTI (lower urinary tract infection) 10/2010    Past Surgical History:  Procedure Laterality Date  . CESAREAN SECTION    . KNEE ARTHROSCOPY  06/15/2011   Procedure: ARTHROSCOPY KNEE;  Surgeon: Rozanna Box, MD;  Location: [facility];  Service: Orthopedics;  Laterality: Left;  LEFT KNEE SCOPE WITH LYSIS OF ADHESIONS AND MANIPULATION  . ORIF TIBIA PLATEAU  03/08/2011   Procedure: OPEN REDUCTION INTERNAL FIXATION (ORIF) TIBIAL PLATEAU;  Surgeon: Rozanna Box;  Location: [facility];  Service: Orthopedics;  Laterality: Left;  . TUBAL LIGATION      There were no vitals filed for this visit.  Subjective Assessment - 02/12/18 1548    Subjective    Pain 10 /10    most days.  Missed PT due to having a cold.      Currently in Pain?  Yes    Pain Score  10-Worst pain ever    Pain Location  Leg    Pain Orientation  Right;Left    Pain Descriptors / Indicators  Shooting;Throbbing;Constant;Stabbing    Pain Type  Chronic pain    Pain Radiating Towards  left hip lateral leg    Aggravating Factors   sleeping on side,  walking hurts,    Pain Relieving Factors  changing position, laying down,  limits longer standing.   stretched  ice, heat,  cream on leg 5 x a day.      Effect of Pain on Daily Activities  cannot get  into/out tub,  limits sleeping,  walking    Multiple Pain Sites  --   shoulders tight  right hand pain  worse  with rain and cold weather.    long standing.                      [facility] Adult PT Treatment/Exercise - 02/12/18 0001      Knee/Hip Exercises: Standing   Gait Training  git trial in clinic with cane.  Pat[place] was able to walk with less pain ,  less gait deviations.  she does not have a cane at home.        Knee/Hip Exercises: Seated  Sit to [employer]  5 reps      Knee/Hip Exercises: Supine   Short Arc [employer]  Strengthening    Short Arc Quad Sets Limitations  5 X stopped due to pain.    Heel Slides  AAROM;Both;1 set;10 reps    Heel Slides Limitations  painful small motions.    Straight Leg Raises  Strengthening;Both;1 set;10 reps    Straight Leg Raises Limitations  painful,  off high bolster to decrease pain.        Moist Heat Therapy   Number Minutes Moist Heat  10 Minutes    Moist Heat Location  Knee   concurrent with pTELLAR MOBS AND RETROGRADE HAMSTRINGS      Manual Therapy   Manual therapy comments  retrograde soft tissue work LT>RT.  tissue softened.  Gentle PROM both and patellar mobs,  able to increase mobility right ,  unable left hyper sensitive patella.  IT band manual,  taught patient how to do.                    PT Long Term Goals - 02/12/18 1638      PT LONG TERM GOAL #1   Title  Patient will stand for 2 hours without increased pain to improve tolerance to work tasks.    Baseline  unable.  had to stop working     Time  4    Period  Weeks    Status  On-going      PT LONG TERM GOAL #2   Title  Patient will demonstrate quad strength at least 4/5 bilateral for improved gait/stair negotiation and less fall risk.    Baseline  strength too painful to test    Time  4    Period  Weeks    Status  On-going      PT LONG TERM GOAL #3   Title  Patient will demonstrate 50% limitation on FOTO    Time  4    Period  Weeks    Status  Unable to assess      PT LONG TERM GOAL #4   Title  Patient independent in HEp for balance/strength and flexibility.    Baseline  independent with beginning exercises, however they are painful    Time  4    Period  Weeks    Status  On-going            Plan - 02/12/18 1639    Clinical Impression Statement  Patient has not been here due to being sick.  She plans to call and let us know when she is unable to attend.  Less pain and improved gait noted at end of session.  Patella left stiff  with decreased ROM.  She is no longer working due to needing to stand 10 to 12 hours.      PT Next Visit Plan  Review HEP, add hip stretching, for L ITB  when able , L ankle strengthening for PF, manual and modalities PRN  BERG? due to fear of falling???    PT Home Exercise Plan  SAQ, SLR, sit to stand    Consulted and Agree with Plan of Care  Patient       Patient will benefit from skilled therapeutic intervention in order to improve the following deficits and impairments:     Visit Diagnosis: Chronic pain of left knee  Chronic pain of right knee  Difficulty in walking, not elsewhere classified  Problem List Patient Active Problem List   Diagnosis Date Noted  . Fracture of tibial plateau, closed 03/08/2011  . GERD (gastroesophageal reflux disease) 03/08/2011  . Bipolar disorder ([place]) 03/08/2011  . OSA (obstructive sleep apnea) 03/08/2011  . Obesity 03/08/2011  . Nicotine dependence 03/08/2011    ,  PTA 02/12/2018, 4:43 PM  [place], [place], [address] Phone: [phone]   Fax:  [phone]  Name: [name] MRN: [id number] Date of Birth: 12/17/1970       PHYSICAL THERAPY DISCHARGE SUMMARY  Visits from Start of Care: 2  Current functional level related to goals / functional outcomes: See goals,    Remaining deficits: Unknown due to not returning   Education / Equipment: HEP  Plan: Patient agrees to discharge.  Patient goals were not met. Patient is being discharged due to not returning since the last visit.  ?????         Kristoffer Leamon PT, DPT, LAT, ATC  02/20/18  4:29 PM

## 2018-02-13 ENCOUNTER — Encounter (INDEPENDENT_AMBULATORY_CARE_PROVIDER_SITE_OTHER): Payer: Self-pay | Admitting: Physician Assistant

## 2018-02-13 ENCOUNTER — Ambulatory Visit (INDEPENDENT_AMBULATORY_CARE_PROVIDER_SITE_OTHER): Payer: Self-pay | Admitting: Physician Assistant

## 2018-02-13 DIAGNOSIS — M25562 Pain in left knee: Secondary | ICD-10-CM

## 2018-02-13 DIAGNOSIS — G8929 Other chronic pain: Secondary | ICD-10-CM

## 2018-02-13 DIAGNOSIS — M25561 Pain in right knee: Secondary | ICD-10-CM

## 2018-02-13 DIAGNOSIS — M1711 Unilateral primary osteoarthritis, right knee: Secondary | ICD-10-CM

## 2018-02-13 DIAGNOSIS — M1712 Unilateral primary osteoarthritis, left knee: Secondary | ICD-10-CM

## 2018-02-13 NOTE — Progress Notes (Signed)
  HPI Ms. Prestridge returns today follow-up bilateral knee pain.  States both knees are bothering her.  She had bilateral knee injections on 12/09/2017.  States left knee is more bothersome than the right.  She has a history of a left tibial plateau fracture and underwent a open reduction internal fixation of the tibial plateau fracture by Dr. Marcelino Scot January 2013 also an arthrotomy with partial lateral meniscectomy.  Subsequently she underwent second surgery by Dr. Marcelino Scot on 06/15/2011 which was knee arthroscopy which showed pristine articular cartilage both the medial lateral compartments and grade 3 changes of the patella she underwent synovectomy at that time due to lysis adhesion and underwent manipulation of the knee.  Physical exam: Bilateral knees full extension full flexion.  She has tenderness along medial lateral joint line of the left knee.  Right knee tenderness along medial joint line only.  No instability valgus varus stressing no abnormal warmth erythema she has significant crepitus patellofemoral region bilaterally.  Impression: Bilateral knee osteoarthritis  Plan: We will obtain an MRI of her left knee to rule out a meniscal tear due to her significant pain despite conservative treatment which include therapy.  I have her follow-up after the MRI to go over results and discuss further treatment.  Questions were encouraged and answered at length.

## 2018-02-17 ENCOUNTER — Encounter: Payer: Self-pay | Admitting: Physical Therapy

## 2018-02-19 ENCOUNTER — Encounter: Payer: Self-pay | Admitting: Physical Therapy

## 2018-02-19 ENCOUNTER — Ambulatory Visit: Payer: No Typology Code available for payment source | Admitting: Physical Therapy

## 2018-02-19 ENCOUNTER — Other Ambulatory Visit (INDEPENDENT_AMBULATORY_CARE_PROVIDER_SITE_OTHER): Payer: Self-pay

## 2018-02-19 ENCOUNTER — Telehealth: Payer: Self-pay | Admitting: Physical Therapy

## 2018-02-19 DIAGNOSIS — M25562 Pain in left knee: Principal | ICD-10-CM

## 2018-02-19 DIAGNOSIS — G8929 Other chronic pain: Secondary | ICD-10-CM

## 2018-02-19 NOTE — Telephone Encounter (Signed)
Called about missed appointment today which was reported as a mix up per pt report due to pt calling a few days ago and asking about visits which they noted there weren't any. I spoke with pt stating that they have another appointment tomorrow at 3:45 and if they cannot make that appointment to call and cancel or reschedule. If she were to miss this appointment it would be grounds for discharge per clinic policy.

## 2018-02-20 ENCOUNTER — Ambulatory Visit: Payer: No Typology Code available for payment source | Admitting: Physical Therapy

## 2018-02-24 MED FILL — ?ESOMEPRAZOLE MAG DR 40MG C: 40 | 30 days supply | Qty: 30 | Fill #2

## 2018-03-03 ENCOUNTER — Ambulatory Visit
Admission: RE | Admit: 2018-03-03 | Discharge: 2018-03-03 | Disposition: A | Discharge status: Home / Self Care | Payer: No Typology Code available for payment source | Source: Ambulatory Visit | Attending: Orthopaedic Surgery | Admitting: Orthopaedic Surgery

## 2018-03-03 DIAGNOSIS — M25562 Pain in left knee: Principal | ICD-10-CM

## 2018-03-03 DIAGNOSIS — G8929 Other chronic pain: Secondary | ICD-10-CM

## 2018-03-07 ENCOUNTER — Telehealth: Payer: Self-pay | Admitting: Gastroenterology

## 2018-03-07 NOTE — Telephone Encounter (Signed)
Desiree have you referred the pt to a GYN?

## 2018-03-07 NOTE — Telephone Encounter (Signed)
Pt would like to know which gyn Dr. Tarri Glenn wants her to see. Pt stated that she does not know any.

## 2018-03-10 ENCOUNTER — Other Ambulatory Visit: Payer: Self-pay | Admitting: Emergency Medicine

## 2018-03-10 ENCOUNTER — Encounter (INDEPENDENT_AMBULATORY_CARE_PROVIDER_SITE_OTHER): Payer: Self-pay | Admitting: Orthopaedic Surgery

## 2018-03-10 ENCOUNTER — Encounter (INDEPENDENT_AMBULATORY_CARE_PROVIDER_SITE_OTHER): Payer: Self-pay | Admitting: Physician Assistant

## 2018-03-10 ENCOUNTER — Ambulatory Visit (INDEPENDENT_AMBULATORY_CARE_PROVIDER_SITE_OTHER): Payer: Self-pay | Admitting: Physician Assistant

## 2018-03-10 DIAGNOSIS — D259 Leiomyoma of uterus, unspecified: Secondary | ICD-10-CM

## 2018-03-10 DIAGNOSIS — S83242A Other tear of medial meniscus, current injury, left knee, initial encounter: Secondary | ICD-10-CM

## 2018-03-10 NOTE — Progress Notes (Signed)
Office Visit Note   Patient: [name]           Date of Birth: 12/23/1970           MRN: [id number] Visit Date: 03/10/2018              Requested by: Clent Demark, PA-C [place],  [id number] PCP: Clent Demark, PA-C   Assessment & Plan: Visit Diagnoses:  1. Acute medial meniscus tear of left knee, initial encounter     Plan: Given patient's findings on MRI of a medial meniscal tear and her continued pain despite conservative treatment recommend left knee arthroscopy with partial medial meniscectomy.  She cannot proceed with this in the near future.  Questions were encouraged and answered by Dr. Ninfa Linden self.  Risk including but not limited to DVT/PE, infection, nerve or vessel injury discussed with patient at length.  She will follow-up 1 week postop  Follow-Up Instructions: Return for 1 week post op.   Orders:  No orders of the defined types were placed in this encounter.  No orders of the defined types were placed in this encounter.     Procedures: No procedures performed   Clinical Data: No additional findings.   Subjective: Chief Complaint  Patient presents with  . Left Knee - Pain    HPI [name] returns today follow-up of her MRI left knee.  Images of the MRI are reviewed with the patient today.  These show a small oblique tear of the posterior horn of the medial meniscus.  High-grade partial-thickness cartilage loss was seen medial patellar femoral compartment.  Partial thickness cartilage loss lateral patellofemoral compartment.  Mild changes medial and lateral compartments. She continues to have pain in the left knee despite conservative treatment. Review of Systems See HPI  Objective: Vital Signs: There were no vitals taken for this visit.  Physical Exam Constitutional:      Appearance: She is not ill-appearing or diaphoretic.     Ortho Exam Left knee full extension flexion.  No instability valgus varus  stressing.  Tenderness along medial joint line. Specialty Comments:  No specialty comments available.  Imaging: No results found.   PMFS History: Patient Active Problem List   Diagnosis Date Noted  . Fracture of tibial plateau, closed 03/08/2011  . GERD (gastroesophageal reflux disease) 03/08/2011  . Bipolar disorder ([place]) 03/08/2011  . OSA (obstructive sleep apnea) 03/08/2011  . Obesity 03/08/2011  . Nicotine dependence 03/08/2011   Past Medical History:  Diagnosis Date  . Alcoholism ([place])   . Anxiety   . Arthritis   . Asthma   . Blood transfusion    1989 at [facility]  . Bronchitis   . Chronic bipolar disorder ([place])   . Chronic kidney disease   . Depression    bipolar  . GERD (gastroesophageal reflux disease)   . Headache(784.0)    occasional  . Hypertension   . Mental disorder    bipolar  . Pancreatitis   . Pyelonephritis 10/2010  . Recurrent upper respiratory infection (URI)    states she has not been to MD and feels like she has  bronchitis now- greenish sputum  . Shortness of breath    sometimes with exertion  . Sickle cell anemia (H[facility])    sickle cell trait   . Sickle cell trait ([place])   . Sleep apnea    CPAP  . Substance abuse ([place])    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  .  UTI (lower urinary tract infection) 10/2010    Family History  Problem Relation Age of Onset  . Thyroid disease Father   . Colon cancer Father   . Diabetes Other   . Cancer Other   . Cystic fibrosis Sister   . Heart disease Brother   . Esophageal cancer Paternal Grandmother   . Anesthesia problems Neg Hx   . Breast cancer Neg Hx     Past Surgical History:  Procedure Laterality Date  . CESAREAN SECTION    . KNEE ARTHROSCOPY  06/15/2011   Procedure: ARTHROSCOPY KNEE;  Surgeon: Rozanna Box, MD;  Location: [facility];  Service: Orthopedics;  Laterality: Left;  LEFT KNEE SCOPE WITH LYSIS OF ADHESIONS AND MANIPULATION  . ORIF TIBIA PLATEAU  03/08/2011   Procedure: OPEN REDUCTION INTERNAL FIXATION  (ORIF) TIBIAL PLATEAU;  Surgeon: Rozanna Box;  Location: [facility];  Service: Orthopedics;  Laterality: Left;  . TUBAL LIGATION     Social History   Occupational History  . Not on file  Tobacco Use  . Smoking status: Current Every Day Smoker    Packs/day: 1.50    Years: 24.00    Pack years: 36.00    Types: Cigarettes  . Smokeless tobacco: Never Used  Substance and Sexual Activity  . Alcohol use: Yes    Comment: drinks liquor everyday  . Drug use: Yes    Types: "Crack" cocaine    Comment: none since 2000  . Sexual activity: Not on file

## 2018-03-10 NOTE — Telephone Encounter (Signed)
Spoke  To patients fiance and let him know referral has been placed and they will call her with an appointment.

## 2018-03-13 ENCOUNTER — Other Ambulatory Visit (INDEPENDENT_AMBULATORY_CARE_PROVIDER_SITE_OTHER): Payer: Self-pay | Admitting: Physician Assistant

## 2018-03-13 ENCOUNTER — Other Ambulatory Visit: Payer: Self-pay | Admitting: Emergency Medicine

## 2018-03-13 DIAGNOSIS — D259 Leiomyoma of uterus, unspecified: Secondary | ICD-10-CM

## 2018-03-13 NOTE — Addendum Note (Signed)
Addended by: Wyline Beady on: 03/13/2018 04:46 PM   Modules accepted: Orders

## 2018-03-14 NOTE — Addendum Note (Signed)
Addended by: Wyline Beady on: 03/14/2018 10:23 AM   Modules accepted: Orders

## 2018-03-17 NOTE — Addendum Note (Signed)
Addended by: Wyline Beady on: 03/17/2018 11:30 AM   Modules accepted: Orders

## 2018-03-19 ENCOUNTER — Encounter (HOSPITAL_BASED_OUTPATIENT_CLINIC_OR_DEPARTMENT_OTHER): Payer: Self-pay | Admitting: *Deleted

## 2018-03-19 ENCOUNTER — Other Ambulatory Visit: Payer: Self-pay

## 2018-03-26 ENCOUNTER — Encounter (HOSPITAL_BASED_OUTPATIENT_CLINIC_OR_DEPARTMENT_OTHER)
Admission: RE | Admit: 2018-03-26 | Discharge: 2018-03-26 | Disposition: A | Discharge status: Home / Self Care | Payer: No Typology Code available for payment source | Source: Ambulatory Visit | Attending: Orthopaedic Surgery | Admitting: Orthopaedic Surgery

## 2018-03-26 LAB — COMPREHENSIVE METABOLIC PANEL
ALT: 13 U/L (ref 0–44)
AST: 18 U/L (ref 15–41)
Albumin: 3.3 g/dL — ABNORMAL LOW (ref 3.5–5.0)
Alkaline Phosphatase: 94 U/L (ref 38–126)
Anion gap: 9 (ref 5–15)
BUN: <5 mg/dL — ABNORMAL LOW (ref 6–20)
CO2: 24 mmol/L (ref 22–32)
Calcium: 9 mg/dL (ref 8.9–10.3)
Chloride: 107 mmol/L (ref 98–111)
Creatinine, Ser: 0.68 mg/dL (ref 0.44–1.00)
GFR calc Af Amer: >60 mL/min (ref >60)
GFR calc non Af Amer: >60 mL/min (ref >60)
Glucose, Bld: 95 mg/dL (ref 70–99)
Potassium: 3.9 mmol/L (ref 3.5–5.1)
Sodium: 140 mmol/L (ref 135–145)
Total Bilirubin: 0.4 mg/dL (ref 0.3–1.2)
Total Protein: 6.7 g/dL (ref 6.5–8.1)

## 2018-03-27 ENCOUNTER — Encounter (HOSPITAL_BASED_OUTPATIENT_CLINIC_OR_DEPARTMENT_OTHER)
Admission: RE | Disposition: A | Discharge status: Home / Self Care | Payer: Self-pay | Source: Home / Self Care | Attending: Orthopaedic Surgery

## 2018-03-27 ENCOUNTER — Encounter (HOSPITAL_BASED_OUTPATIENT_CLINIC_OR_DEPARTMENT_OTHER): Payer: Self-pay | Admitting: Certified Registered"

## 2018-03-27 ENCOUNTER — Other Ambulatory Visit: Payer: Self-pay

## 2018-03-27 ENCOUNTER — Ambulatory Visit (HOSPITAL_BASED_OUTPATIENT_CLINIC_OR_DEPARTMENT_OTHER)
Admission: RE | Admit: 2018-03-27 | Discharge: 2018-03-27 | Disposition: A | Discharge status: Home / Self Care | Payer: Self-pay | Attending: Orthopaedic Surgery | Admitting: Orthopaedic Surgery

## 2018-03-27 ENCOUNTER — Ambulatory Visit (HOSPITAL_BASED_OUTPATIENT_CLINIC_OR_DEPARTMENT_OTHER): Payer: Self-pay | Admitting: Certified Registered"

## 2018-03-27 DIAGNOSIS — N189 Chronic kidney disease, unspecified: Secondary | ICD-10-CM | POA: Insufficient documentation

## 2018-03-27 DIAGNOSIS — Z881 Allergy status to other antibiotic agents status: Secondary | ICD-10-CM | POA: Insufficient documentation

## 2018-03-27 DIAGNOSIS — J45909 Unspecified asthma, uncomplicated: Secondary | ICD-10-CM | POA: Insufficient documentation

## 2018-03-27 DIAGNOSIS — S83242D Other tear of medial meniscus, current injury, left knee, subsequent encounter: Secondary | ICD-10-CM

## 2018-03-27 DIAGNOSIS — Z91018 Allergy to other foods: Secondary | ICD-10-CM | POA: Insufficient documentation

## 2018-03-27 DIAGNOSIS — F319 Bipolar disorder, unspecified: Secondary | ICD-10-CM | POA: Insufficient documentation

## 2018-03-27 DIAGNOSIS — Z886 Allergy status to analgesic agent status: Secondary | ICD-10-CM | POA: Insufficient documentation

## 2018-03-27 DIAGNOSIS — Z91013 Allergy to seafood: Secondary | ICD-10-CM | POA: Insufficient documentation

## 2018-03-27 DIAGNOSIS — I129 Hypertensive chronic kidney disease with stage 1 through stage 4 chronic kidney disease, or unspecified chronic kidney disease: Secondary | ICD-10-CM | POA: Insufficient documentation

## 2018-03-27 DIAGNOSIS — Z91040 Latex allergy status: Secondary | ICD-10-CM | POA: Insufficient documentation

## 2018-03-27 DIAGNOSIS — Z880 Allergy status to penicillin: Secondary | ICD-10-CM | POA: Insufficient documentation

## 2018-03-27 DIAGNOSIS — Z79899 Other long term (current) drug therapy: Secondary | ICD-10-CM | POA: Insufficient documentation

## 2018-03-27 DIAGNOSIS — S83242A Other tear of medial meniscus, current injury, left knee, initial encounter: Secondary | ICD-10-CM | POA: Insufficient documentation

## 2018-03-27 DIAGNOSIS — G4730 Sleep apnea, unspecified: Secondary | ICD-10-CM | POA: Insufficient documentation

## 2018-03-27 DIAGNOSIS — F17210 Nicotine dependence, cigarettes, uncomplicated: Secondary | ICD-10-CM | POA: Insufficient documentation

## 2018-03-27 HISTORY — DX: Methicillin resistant Staphylococcus aureus infection, unspecified site: A49.02

## 2018-03-27 HISTORY — PX: KNEE ARTHROSCOPY WITH MEDIAL MENISECTOMY: SHX5651

## 2018-03-27 SURGERY — ARTHROSCOPY, KNEE, WITH MEDIAL MENISCECTOMY
Anesthesia: General | Site: Knee | Laterality: Left

## 2018-03-27 MED ORDER — HYDROMORPHONE HCL 1 MG/ML IJ SOLN
0.2500 mg | INTRAMUSCULAR | Status: DC | PRN
  Administered 2018-03-27: 0.5 mg via INTRAVENOUS

## 2018-03-27 MED ORDER — EPHEDRINE 5 MG/ML INJ
INTRAVENOUS | Status: AC
  Filled 2018-03-27: qty 10

## 2018-03-27 MED ORDER — FENTANYL CITRATE (PF) 100 MCG/2ML IJ SOLN
INTRAMUSCULAR | Status: AC
  Filled 2018-03-27: qty 2

## 2018-03-27 MED ORDER — HYDROCODONE-ACETAMINOPHEN 5-325 MG PO TABS: 1.0000 | ORAL_TABLET | Freq: Four times a day (QID) | ORAL | 0 refills | Status: DC | PRN

## 2018-03-27 MED ORDER — OXYCODONE HCL 5 MG PO TABS
5.0000 mg | ORAL_TABLET | Freq: Once | ORAL | Status: AC | PRN
  Administered 2018-03-27: 5 mg via ORAL

## 2018-03-27 MED ORDER — MIDAZOLAM HCL 2 MG/2ML IJ SOLN
1.0000 mg | INTRAMUSCULAR | Status: DC | PRN
  Administered 2018-03-27: 2 mg via INTRAVENOUS

## 2018-03-27 MED ORDER — CHLORHEXIDINE GLUCONATE 4 % EX LIQD: 60.0000 mL | Freq: Once | CUTANEOUS | Status: DC

## 2018-03-27 MED ORDER — ONDANSETRON HCL 4 MG/2ML IJ SOLN
INTRAMUSCULAR | Status: AC
  Filled 2018-03-27: qty 8

## 2018-03-27 MED ORDER — ONDANSETRON HCL 4 MG/2ML IJ SOLN
INTRAMUSCULAR | Status: DC | PRN
  Administered 2018-03-27: 4 mg via INTRAVENOUS

## 2018-03-27 MED ORDER — HYDROMORPHONE HCL 1 MG/ML IJ SOLN
INTRAMUSCULAR | Status: AC
  Filled 2018-03-27: qty 0.5

## 2018-03-27 MED ORDER — PROPOFOL 10 MG/ML IV BOLUS
INTRAVENOUS | Status: AC
  Filled 2018-03-27: qty 20

## 2018-03-27 MED ORDER — LIDOCAINE 2% (20 MG/ML) 5 ML SYRINGE
INTRAMUSCULAR | Status: AC
  Filled 2018-03-27: qty 25

## 2018-03-27 MED ORDER — SCOPOLAMINE 1 MG/3DAYS TD PT72: 1.0000 | MEDICATED_PATCH | Freq: Once | TRANSDERMAL | Status: DC | PRN

## 2018-03-27 MED ORDER — OXYCODONE HCL 5 MG/5ML PO SOLN: 5.0000 mg | Freq: Once | ORAL | Status: AC | PRN

## 2018-03-27 MED ORDER — FENTANYL CITRATE (PF) 100 MCG/2ML IJ SOLN
50.0000 ug | INTRAMUSCULAR | Status: DC | PRN
  Administered 2018-03-27 (×2): 100 ug via INTRAVENOUS

## 2018-03-27 MED ORDER — PROMETHAZINE HCL 25 MG/ML IJ SOLN: 6.2500 mg | INTRAMUSCULAR | Status: DC | PRN

## 2018-03-27 MED ORDER — DEXAMETHASONE SODIUM PHOSPHATE 10 MG/ML IJ SOLN
INTRAMUSCULAR | Status: AC
  Filled 2018-03-27: qty 2

## 2018-03-27 MED ORDER — BUPIVACAINE HCL (PF) 0.25 % IJ SOLN
INTRAMUSCULAR | Status: AC
  Filled 2018-03-27: qty 30

## 2018-03-27 MED ORDER — BUPIVACAINE HCL (PF) 0.25 % IJ SOLN
INTRAMUSCULAR | Status: DC | PRN
  Administered 2018-03-27: 20 mL

## 2018-03-27 MED ORDER — MEPERIDINE HCL 25 MG/ML IJ SOLN: 6.2500 mg | INTRAMUSCULAR | Status: DC | PRN

## 2018-03-27 MED ORDER — LACTATED RINGERS IV SOLN
INTRAVENOUS | Status: DC
  Administered 2018-03-27 (×2): via INTRAVENOUS

## 2018-03-27 MED ORDER — CLINDAMYCIN PHOSPHATE 900 MG/50ML IV SOLN
INTRAVENOUS | Status: AC
  Filled 2018-03-27: qty 50

## 2018-03-27 MED ORDER — DEXAMETHASONE SODIUM PHOSPHATE 10 MG/ML IJ SOLN
INTRAMUSCULAR | Status: DC | PRN
  Administered 2018-03-27: 4 mg via INTRAVENOUS

## 2018-03-27 MED ORDER — CLINDAMYCIN PHOSPHATE 900 MG/50ML IV SOLN
900.0000 mg | INTRAVENOUS | Status: AC
  Administered 2018-03-27 (×2): 900 mg via INTRAVENOUS

## 2018-03-27 MED ORDER — PHENYLEPHRINE 40 MCG/ML (10ML) SYRINGE FOR IV PUSH (FOR BLOOD PRESSURE SUPPORT)
PREFILLED_SYRINGE | INTRAVENOUS | Status: AC
  Filled 2018-03-27: qty 20

## 2018-03-27 MED ORDER — MIDAZOLAM HCL 2 MG/2ML IJ SOLN
INTRAMUSCULAR | Status: AC
  Filled 2018-03-27: qty 2

## 2018-03-27 MED ORDER — OXYCODONE HCL 5 MG PO TABS
ORAL_TABLET | ORAL | Status: AC
  Filled 2018-03-27: qty 1

## 2018-03-27 SURGICAL SUPPLY — 35 items
BANDAGE ACE 6X5 VEL STRL LF (GAUZE/BANDAGES/DRESSINGS) ×3 IMPLANT
BLADE 4.2CUDA (BLADE) IMPLANT
BLADE CUDA GRT WHITE 3.5 (BLADE) IMPLANT
BLADE CUDA SHAVER 3.5 (BLADE) IMPLANT
BLADE CUTTER GATOR 3.5 (BLADE) IMPLANT
BLADE GREAT WHITE 4.2 (BLADE) IMPLANT
BLADE GREAT WHITE 4.2MM (BLADE)
COVER WAND RF STERILE (DRAPES) IMPLANT
DRAPE ARTHROSCOPY W/POUCH 90 (DRAPES) ×3 IMPLANT
DRAPE U-SHAPE 47X51 STRL (DRAPES) ×3 IMPLANT
DRSG PAD ABDOMINAL 8X10 ST (GAUZE/BANDAGES/DRESSINGS) ×3 IMPLANT
DURAPREP 26ML APPLICATOR (WOUND CARE) ×3 IMPLANT
ELECT MENISCUS 165MM 90D (ELECTRODE) IMPLANT
ELECT REM PT RETURN 9FT ADLT (ELECTROSURGICAL)
ELECTRODE REM PT RTRN 9FT ADLT (ELECTROSURGICAL) IMPLANT
GAUZE SPONGE 4X4 12PLY STRL (GAUZE/BANDAGES/DRESSINGS) ×3 IMPLANT
GAUZE XEROFORM 1X8 LF (GAUZE/BANDAGES/DRESSINGS) ×3 IMPLANT
GLOVE BIOGEL PI IND STRL 8 (GLOVE) ×2 IMPLANT
GLOVE BIOGEL PI INDICATOR 8 (GLOVE) ×4
GLOVE ORTHO TXT STRL SZ7.5 (GLOVE) ×3 IMPLANT
GLOVE SURG ORTHO 8.0 STRL STRW (GLOVE) ×3 IMPLANT
GOWN STRL REUS W/ TWL LRG LVL3 (GOWN DISPOSABLE) ×2 IMPLANT
GOWN STRL REUS W/ TWL XL LVL3 (GOWN DISPOSABLE) ×1 IMPLANT
GOWN STRL REUS W/TWL LRG LVL3 (GOWN DISPOSABLE) ×4
GOWN STRL REUS W/TWL XL LVL3 (GOWN DISPOSABLE) ×2
KNEE WRAP E Z 3 GEL PACK (MISCELLANEOUS) ×3 IMPLANT
MANIFOLD NEPTUNE II (INSTRUMENTS) IMPLANT
PACK ARTHROSCOPY DSU (CUSTOM PROCEDURE TRAY) ×3 IMPLANT
PACK BASIN DAY SURGERY FS (CUSTOM PROCEDURE TRAY) ×3 IMPLANT
PADDING CAST COTTON 6X4 STRL (CAST SUPPLIES) ×3 IMPLANT
PENCIL BUTTON HOLSTER BLD 10FT (ELECTRODE) IMPLANT
SUT ETHILON 3 0 PS 1 (SUTURE) ×3 IMPLANT
TOWEL GREEN STERILE FF (TOWEL DISPOSABLE) ×3 IMPLANT
TUBING ARTHRO INFLOW-ONLY STRL (TUBING) ×3 IMPLANT
WATER STERILE IRR 1000ML POUR (IV SOLUTION) ×3 IMPLANT

## 2018-03-27 NOTE — H&P (Signed)
[name] is an 48 y.o. female.   Chief Complaint:   Left knee pain with locking and catching HPI:   47 yo female with a known symptomatic left knee meniscal tear confirmed by MRI.  Tried and failed conservative treatment.  Past Medical History:  Diagnosis Date  . Alcoholism ([place])   . Anxiety   . Arthritis   . Asthma   . Blood transfusion    1989 at [facility]  . Bronchitis   . Chronic bipolar disorder ([place])   . Chronic kidney disease   . Depression    bipolar  . GERD (gastroesophageal reflux disease)   . Headache(784.0)    occasional  . Hypertension   . Mental disorder    bipolar  . MRSA infection 2011   left lower leg  . Pancreatitis   . Pyelonephritis 10/2010  . Recurrent upper respiratory infection (URI)    states she has not been to MD and feels like she has  bronchitis now- greenish sputum  . Shortness of breath    sometimes with exertion  . Sickle cell anemia (HCC)    sickle cell trait   . Sickle cell trait ([place])   . Sleep apnea    CPAP  . Substance abuse ([place])    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  . UTI (lower urinary tract infection) 10/2010    Past Surgical History:  Procedure Laterality Date  . CESAREAN SECTION    . KNEE ARTHROSCOPY  06/15/2011   Procedure: ARTHROSCOPY KNEE;  Surgeon: Rozanna Box, MD;  Location: [facility];  Service: Orthopedics;  Laterality: Left;  LEFT KNEE SCOPE WITH LYSIS OF ADHESIONS AND MANIPULATION  . ORIF TIBIA PLATEAU  03/08/2011   Procedure: OPEN REDUCTION INTERNAL FIXATION (ORIF) TIBIAL PLATEAU;  Surgeon: Rozanna Box;  Location: [facility];  Service: Orthopedics;  Laterality: Left;  . TUBAL LIGATION      Family History  Problem Relation Age of Onset  . Thyroid disease Father   . Colon cancer Father   . Diabetes Other   . Cancer Other   . Cystic fibrosis Sister   . Heart disease Brother   . Esophageal cancer Paternal Grandmother   . Anesthesia problems Neg Hx   . Breast cancer Neg Hx    Social History:  reports that she has been  smoking cigarettes. She has a 12.00 pack-year smoking history. She has never used smokeless tobacco. She reports current alcohol use. She reports previous drug use. Drug: "Crack" cocaine.  Allergies:  Allergies  Allergen Reactions  . Aspirin Other (See Comments)    "chilhood allergy"  . Banana Hives  . Latex Hives  . Penicillins Swelling    Has patient had a PCN reaction causing immediate rash, facial/tongue/throat swelling, SOB or lightheadedness with hypotension: unknown Has patient had a PCN reaction causing severe rash involving mucus membranes or skin necrosis: unknown Has patient had a PCN reaction that required hospitalization unknown Has patient had a PCN reaction occurring within the last 10 years: no If all of the above answers are "NO", then may proceed with Cephalosporin use.   Sarina Ill [Bactrim] Itching and Swelling  . Shellfish Allergy Other (See Comments)    No "reaction" tested positive  . Strawberry Extract Hives  . Tape Other (See Comments)    Skin peels away if paper tape is left on for long period of time    Medications Prior to Admission  Medication Sig Dispense Refill  . amLODipine (NORVASC) 5 MG  tablet Take 1 tablet (5 mg total) by mouth daily. 30 tablet 5  . atorvastatin (LIPITOR) 40 MG tablet Take 1 tablet (40 mg total) by mouth daily. 90 tablet 3  . diclofenac sodium (VOLTAREN) 1 % GEL Apply 4 g topically 4 (four) times daily. 1 Tube 1  . gabapentin (NEURONTIN) 100 MG capsule Take 100 mg by mouth 4 (four) times daily.    . hydrochlorothiazide (HYDRODIURIL) 12.5 MG tablet Take 1 tablet (12.5 mg total) by mouth daily. 30 tablet 5  . QUEtiapine (SEROQUEL) 50 MG tablet Take 50 mg by mouth at bedtime.    . varenicline (CHANTIX CONTINUING MONTH PAK) 1 MG tablet Take 1 tablet (1 mg total) by mouth 2 (two) times daily. 60 tablet 3    Results for orders placed or performed during the hospital encounter of 03/27/18 (from the past 48 hour(s))  Comprehensive  metabolic panel     Status: Abnormal   Collection Time: 03/26/18  1:00 PM  Result Value Ref Range   Sodium 140 135 - 145 mmol/L   Potassium 3.9 3.5 - 5.1 mmol/L   Chloride 107 98 - 111 mmol/L   CO2 24 22 - 32 mmol/L   Glucose, Bld 95 70 - 99 mg/dL   BUN <5 (L) 6 - 20 mg/dL   Creatinine, Ser 0.68 0.44 - 1.00 mg/dL   Calcium 9.0 8.9 - 10.3 mg/dL   Total Protein 6.7 6.5 - 8.1 g/dL   Albumin 3.3 (L) 3.5 - 5.0 g/dL   AST 18 15 - 41 U/L   ALT 13 0 - 44 U/L   Alkaline Phosphatase 94 38 - 126 U/L   Total Bilirubin 0.4 0.3 - 1.2 mg/dL   GFR calc non Af Amer >60 >60 mL/min   GFR calc Af Amer >60 >60 mL/min   Anion gap 9 5 - 15    Comment: Performed at [facility], [address], [place], [place] [address]   No results found.  Review of Systems  All other systems reviewed and are negative.   Blood pressure (!) 142/92, pulse 87, temperature 98.3 F (36.8 C), temperature source Oral, resp. rate 18, height 5\' 7"  (1.702 m), weight 99 kg, last menstrual period 03/05/2018, SpO2 100 %. Physical Exam  Constitutional: She is oriented to person, place, and time. She appears well-developed and well-nourished.  HENT:  Head: Normocephalic and atraumatic.  Eyes: Pupils are equal, round, and reactive to light. EOM are normal.  Neck: Normal range of motion. Neck supple.  Cardiovascular: Normal rate.  Respiratory: Effort normal.  GI: Soft.  Musculoskeletal:     Left knee: She exhibits decreased range of motion, swelling, effusion and abnormal meniscus. Tenderness found. Medial joint line tenderness noted.  Neurological: She is alert and oriented to person, place, and time.  Skin: Skin is warm and dry.  Psychiatric: She has a normal mood and affect.     Assessment/Plan Left knee with medial meniscal tear  To the OR today as an outpatient for a partial left arthroscopy.  Risks and benefits have been discussed an informed consent is obtained.  Mcarthur Rossetti, MD 03/27/2018, 7:12  AM

## 2018-03-27 NOTE — Anesthesia Procedure Notes (Signed)
Procedure Name: LMA Insertion Date/Time: 03/27/2018 7:23 AM Performed by: Signe Colt, CRNA Pre-anesthesia Checklist: Patient identified, Emergency Drugs available, Suction available and Patient being monitored Patient Re-evaluated:Patient Re-evaluated prior to induction Oxygen Delivery Method: Circle system utilized Preoxygenation: Pre-oxygenation with 100% oxygen Induction Type: IV induction Ventilation: Mask ventilation without difficulty LMA: LMA inserted LMA Size: 4.0 Number of attempts: 1 Airway Equipment and Method: Bite block Placement Confirmation: positive ETCO2 Tube secured with: Tape Dental Injury: Teeth and Oropharynx as per pre-operative assessment

## 2018-03-27 NOTE — Brief Op Note (Signed)
03/27/2018  7:58 AM  PATIENT:  [name]  48 y.o. female  PRE-OPERATIVE DIAGNOSIS:  left knee medial meniscal tear  POST-OPERATIVE DIAGNOSIS:  left knee medial meniscal tear  PROCEDURE:  Procedure(s): LEFT KNEE ARTHROSCOPY WITH PARTIAL MEDIAL MENISCECTOMY (Left)  SURGEON:  Surgeon(s) and Role:    Mcarthur Rossetti, MD - Primary  ANESTHESIA:   local and general  COUNTS:  YES  TOURNIQUET:  * Missing tourniquet times found for documented tourniquets in log: [id number] *  DICTATION: .Other Dictation: Dictation Number [id number]  PLAN OF CARE: Discharge to home after PACU  PATIENT DISPOSITION:  PACU - hemodynamically stable.   Delay start of Pharmacological VTE agent (>24hrs) due to surgical blood loss or risk of bleeding: no

## 2018-03-27 NOTE — Discharge Instructions (Signed)
°  Gave pain medication at 8:50 am today!      Increase your activities as comfort allows. You may put all of your weight on your left knee/leg as comfort allows. Ice and elevation as needed for swelling. You may remove all of your dressings tomorrow 1/24 and get your knee wet in the shower. Place small band-aids over your incisions daily after each shower. Do pump your feet occasionally throughout the day.    Please excuse Ms. Chowning absence from work today 03/27/18 due to her knee surgery.        Post Anesthesia Home Care Instructions  Activity: Get plenty of rest for the remainder of the day. A responsible individual must stay with you for 24 hours following the procedure.  For the next 24 hours, DO NOT: -Drive a car -Paediatric nurse -Drink alcoholic beverages -Take any medication unless instructed by your physician -Make any legal decisions or sign important papers.  Meals: Start with liquid foods such as gelatin or soup. Progress to regular foods as tolerated. Avoid greasy, spicy, heavy foods. If nausea and/or vomiting occur, drink only clear liquids until the nausea and/or vomiting subsides. Call your physician if vomiting continues.  Special Instructions/Symptoms: Your throat may feel dry or sore from the anesthesia or the breathing tube placed in your throat during surgery. If this causes discomfort, gargle with warm salt water. The discomfort should disappear within 24 hours.  If you had a scopolamine patch placed behind your ear for the management of post- operative nausea and/or vomiting:  1. The medication in the patch is effective for 72 hours, after which it should be removed.  Wrap patch in a tissue and discard in the trash. Wash hands thoroughly with soap and water. 2. You may remove the patch earlier than 72 hours if you experience unpleasant side effects which may include dry mouth, dizziness or visual disturbances. 3. Avoid touching the patch. Wash your  hands with soap and water after contact with the patch.

## 2018-03-27 NOTE — Op Note (Signed)
NAMEBRIGHTEN, BUZZELLI MEDICAL RECORD ZO:[id number] ACCOUNT [id number] DATE OF BIRTH:[birth year] FACILITY: [facility] LOCATION: [facility] PHYSICIAN:Exzavier Ruderman Kerry Fort, MD  OPERATIVE REPORT  DATE OF PROCEDURE:  03/27/2018  PREOPERATIVE DIAGNOSIS:  Left knee anterior horn to midbody medial meniscal tear.  POSTOPERATIVE DIAGNOSIS:  Left knee anterior horn to midbody medial meniscal tear.  PROCEDURE:  Left knee arthroscopy with partial medial meniscectomy.  SURGEON:  Lind Guest. Ninfa Linden, MD  ANESTHESIA: 1.  General. 2.  Local with 0.25% plain Marcaine.  ESTIMATED BLOOD LOSS:  Minimal.  COMPLICATIONS:  None.  ANTIBIOTICS:  900 mg IV clindamycin.  INDICATIONS:  The patient is a 48 year old female with known medial meniscal tear.  This has become symptomatic.  The knee swells on her, and she reports locking and catching.  This was confirmed with an MRI.  She has tried and failed conservative  treatment measures including rest, activity modification, anti-inflammatories and injections.  At this point, she does wish to proceed with an arthroscopic intervention.  We had a long and thorough discussion about the risks and benefits of surgery, and  she does wish to proceed.  DESCRIPTION OF PROCEDURE:  After informed consent was obtained and appropriate left knee was marked, she was brought to the operating room and placed supine on the operating table.  General anesthesia was then obtained.  Her left thigh, knee, leg, ankle  and foot were prepped and draped with DuraPrep and sterile drapes including a sterile stockinette.  A lateral leg post utilized, and the bed was raised.  A time-out was called, and she was identified as correct patient, correct left knee.  I then made an  anterior lateral arthroscopy portal and inserted a cannula in the knee and did not find any effusion at all.  I went to the medial compartment of the knee and made an anterior medial incision.  We probed the meniscus  and found a small posterior horn  tear, but that was small at the root, and we did not feel that we needed to get back to the knee and do anything for that in the back of the knee.  At the central body to the anterior aspect of the anterior horn, there was a meniscal tear.  Using an  arthroscopic shaver, I debrided this back with minimal debridement of the meniscus with a partial medial meniscectomy.  We found the cartilage on the medial femoral condyle and the medial tibial plateau to be intact.  The intercondylar area of the knee  was assessed, and it was intact.  With the knee in a figure-of-four position, the lateral compartment was assessed, and it was intact as well.  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  This was debrided back to a stable margin using arthroscopic shaver and a chondroplasty just in this area only.  The trochlear groove itself looked pristine.  I then allowed fluid lavage of the knee and then drained all the fluid from the knee.   The portal sites were then closed with interrupted nylon suture.  I then inserted Marcaine into the knee joint and the portal sites.  A well-padded sterile dressing was applied.  She was awakened, extubated, and taken to recovery room in stable  condition.  All final counts were correct.  There were no complications noted.  Postoperatively, I will allow her to weightbear as tolerated with increasing activity as comfort allows.  We will see her back in the  office in a week.  LN/NUANCE  D:03/27/2018 T:03/27/2018 JOB:[id number]

## 2018-03-27 NOTE — Anesthesia Preprocedure Evaluation (Signed)
Anesthesia Evaluation  Patient identified by MRN, date of birth, ID band Patient awake    Reviewed: Allergy & Precautions, H&P , NPO status , Patient's Chart, lab work & pertinent test results  Airway Mallampati: I  TM Distance: >3 FB Neck ROM: Full    Dental no notable dental hx. (+) Teeth Intact   Pulmonary shortness of breath and at rest, sleep apnea and Continuous Positive Airway Pressure Ventilation , Recent URI , Current Smoker,    Pulmonary exam normal breath sounds clear to auscultation       Cardiovascular hypertension, Pt. on medications negative cardio ROS   Rhythm:Regular Rate:Normal     Neuro/Psych  Headaches, PSYCHIATRIC DISORDERS Anxiety Depression Bipolar Disorder    GI/Hepatic Neg liver ROS, GERD  Poorly Controlled,  Endo/Other  negative endocrine ROS  Renal/GU negative Renal ROS  negative genitourinary   Musculoskeletal  (+) Arthritis , Osteoarthritis,    Abdominal (+) + obese,   Peds  Hematology negative hematology ROS (+)   Anesthesia Other Findings   Reproductive/Obstetrics negative OB ROS                             Anesthesia Physical  Anesthesia Plan  ASA: III  Anesthesia Plan: General   Post-op Pain Management:    Induction: Intravenous  PONV Risk Score and Plan: 2 and Ondansetron and Midazolam  Airway Management Planned: LMA  Additional Equipment:   Intra-op Plan:   Post-operative Plan: Extubation in OR  Informed Consent: I have reviewed the patients History and Physical, chart, labs and discussed the procedure including the risks, benefits and alternatives for the proposed anesthesia with the patient or authorized representative who has indicated his/her understanding and acceptance.       Plan Discussed with: CRNA  Anesthesia Plan Comments:         Anesthesia Quick Evaluation

## 2018-03-27 NOTE — Anesthesia Postprocedure Evaluation (Signed)
Anesthesia Post Note  Patient: [name]  Procedure(s) Performed: LEFT KNEE ARTHROSCOPY WITH PARTIAL MEDIAL MENISCECTOMY (Left Knee)     Patient location during evaluation: PACU Anesthesia Type: General Level of consciousness: awake and alert Pain management: pain level controlled Vital Signs Assessment: post-procedure vital signs reviewed and stable Respiratory status: spontaneous breathing, nonlabored ventilation and respiratory function stable Cardiovascular status: blood pressure returned to baseline and stable Postop Assessment: no apparent nausea or vomiting Anesthetic complications: no    Last Vitals:  Vitals:   03/27/18 0845 03/27/18 0930  BP: (!) 139/91 138/79  Pulse: 80 87  Resp: 16 18  Temp:  36.9 C  SpO2: 100% 100%    Last Pain:  Vitals:   03/27/18 0930  TempSrc:   PainSc: 4     LLE Motor Response: Purposeful movement (03/27/18 0930) LLE Sensation: Full sensation (03/27/18 0930)          Lynda Rainwater

## 2018-03-27 NOTE — Transfer of Care (Signed)
Immediate Anesthesia Transfer of Care Note  Patient: [name]  Procedure(s) Performed: LEFT KNEE ARTHROSCOPY WITH PARTIAL MEDIAL MENISCECTOMY (Left Knee)  Patient Location: PACU  Anesthesia Type:General  Level of Consciousness: awake, alert , oriented and patient cooperative  Airway & Oxygen Therapy: Patient Spontanous Breathing and Patient connected to face mask oxygen  Post-op Assessment: Report given to RN and Post -op Vital signs reviewed and stable  Post vital signs: Reviewed and stable  Last Vitals:  Vitals Value Taken Time  BP    Temp    Pulse    Resp    SpO2      Last Pain:  Vitals:   03/27/18 0631  TempSrc: Oral  PainSc: 8       Patients Stated Pain Goal: 4 (17/49/44 9675)  Complications: No apparent anesthesia complications

## 2018-03-28 ENCOUNTER — Encounter (HOSPITAL_BASED_OUTPATIENT_CLINIC_OR_DEPARTMENT_OTHER): Payer: Self-pay | Admitting: Orthopaedic Surgery

## 2018-04-03 ENCOUNTER — Encounter (INDEPENDENT_AMBULATORY_CARE_PROVIDER_SITE_OTHER): Payer: Self-pay | Admitting: Physician Assistant

## 2018-04-03 ENCOUNTER — Ambulatory Visit (INDEPENDENT_AMBULATORY_CARE_PROVIDER_SITE_OTHER): Payer: No Typology Code available for payment source | Admitting: Physician Assistant

## 2018-04-03 DIAGNOSIS — Z9889 Other specified postprocedural states: Secondary | ICD-10-CM

## 2018-04-03 NOTE — Progress Notes (Signed)
HPI: Ms. Ola returns today 1 week status post left knee arthroscopy.  She states overall she is having some dull achiness in the knee particularly in the posterior aspect.  No chest pain shortness of breath fevers chills or calf pain.  She underwent a left knee arthroscopy with partial medial meniscectomy and debridement of grade 2/ 3 chondromalacia of the patella.  Physical exam: General well-developed well-nourished female no acute distress.  Left knee: Port sites well approximated with interrupted nylon sutures no signs of infection.  She has full extension and flexion to 110 degrees.  Calf supple nontender.  Impression: Status post left knee arthroscopy  Plan: Sutures removed she will work on scar tissue mobilization.  Work on range of motion and quad strengthening.  Follow-up with [place] in 1 month sooner if there is any questions concerns.  Was given a note to return back to work full duties on 04/07/2018.

## 2018-04-07 ENCOUNTER — Ambulatory Visit (INDEPENDENT_AMBULATORY_CARE_PROVIDER_SITE_OTHER): Payer: Self-pay | Admitting: Internal Medicine

## 2018-04-07 ENCOUNTER — Other Ambulatory Visit: Payer: Self-pay

## 2018-04-07 ENCOUNTER — Encounter (INDEPENDENT_AMBULATORY_CARE_PROVIDER_SITE_OTHER): Payer: Self-pay | Admitting: Internal Medicine

## 2018-04-07 VITALS — BP 133/83 | HR 89 | Temp 98.1°F | Ht 67.0 in | Wt 222.2 lb

## 2018-04-07 DIAGNOSIS — B009 Herpesviral infection, unspecified: Secondary | ICD-10-CM

## 2018-04-07 MED ORDER — ACYCLOVIR 400 MG PO TABS: 400.0000 mg | ORAL_TABLET | Freq: Two times a day (BID) | ORAL | 2 refills | Status: DC

## 2018-04-07 MED ORDER — ACYCLOVIR 400 MG PO TABS: 400.0000 mg | ORAL_TABLET | Freq: Three times a day (TID) | ORAL | 0 refills | Status: DC

## 2018-04-07 MED FILL — ACYCLOVIR 400 MG TABLET: 400 | 30 days supply | Qty: 60 | Fill #0

## 2018-04-07 MED FILL — HYDROCHLOROTHIAZIDE 12.5 MG: 12.5 | 30 days supply | Qty: 30 | Fill #2

## 2018-04-07 MED FILL — AMLODIPINE BESYLATE 5 MG TA: 5 | 30 days supply | Qty: 30 | Fill #1

## 2018-04-07 MED FILL — ?ATORVASTATIN 40MG TABLET: 40 | 30 days supply | Qty: 30 | Fill #1

## 2018-04-07 MED FILL — ?ESOMEPRAZOLE MAG DR 40MG C: 40 | 30 days supply | Qty: 30 | Fill #3

## 2018-04-07 MED FILL — ACYCLOVIR 400 MG TABLET: 400 | 5 days supply | Qty: 15 | Fill #0

## 2018-04-07 NOTE — Patient Instructions (Signed)
Take acyclovir 500 mg 3 times a day for 5 days.  After that you will start taking acyclovir 500 mg twice a day to help prevent future outbreaks.   Genital Herpes Genital herpes is a common sexually transmitted infection (STI) that is caused by a virus. The virus spreads from person to person through sexual contact. Infection can cause itching, blisters, and sores around the genitals or rectum. Symptoms may last several days and then go away This is called an outbreak. However, the virus remains in your body, so you may have more outbreaks in the future. The time between outbreaks varies and can be months or years. Genital herpes affects men and women. It is particularly concerning for pregnant women because the virus can be passed to the baby during delivery and can cause serious problems. Genital herpes is also a concern for people who have a weak disease-fighting (immune) system. What are the causes? This condition is caused by the herpes simplex virus (HSV) type 1 or type 2. The virus may spread through:  Sexual contact with an infected person, including vaginal, anal, and oral sex.  Contact with fluid from a herpes sore.  The skin. This means that you can get herpes from an infected partner even if he or she does not have a visible sore or does not know that he or she is infected. What increases the risk? You are more likely to develop this condition if:  You have sex with many partners.  You do not use latex condoms during sex. What are the signs or symptoms? Most people do not have symptoms (asymptomatic) or have mild symptoms that may be mistaken for other skin problems. Symptoms may include:  Small red bumps near the genitals, rectum, or mouth. These bumps turn into blisters and then turn into sores.  Flu-like symptoms, including: ? Fever. ? Body aches. ? Swollen lymph nodes. ? Headache.  Painful urination.  Pain and itching in the genital area or rectal area.  Vaginal  discharge.  Tingling or shooting pain in the legs and buttocks. Generally, symptoms are more severe and last longer during the first (primary) outbreak. Flu-like symptoms are also more common during the primary outbreak. How is this diagnosed? Genital herpes may be diagnosed based on:  A physical exam.  Your medical history.  Blood tests.  A test of a fluid sample (culture) from an open sore. How is this treated? There is no cure for this condition, but treatment with antiviral medicines that are taken by mouth (orally) can do the following:  Speed up healing and relieve symptoms.  Help to reduce the spread of the virus to sexual partners.  Limit the chance of future outbreaks, or make future outbreaks shorter.  Lessen symptoms of future outbreaks. Your health care provider may also recommend pain relief medicines, such as aspirin or ibuprofen. Follow these instructions at home: Sexual activity  Do not have sexual contact during active outbreaks.  Practice safe sex. Latex condoms and female condoms may help prevent the spread of the herpes virus. General instructions  Keep the affected areas dry and clean.  Take over-the-counter and prescription medicines only as told by your health care provider.  Avoid rubbing or touching blisters and sores. If you do touch blisters or sores: ? Wash your hands thoroughly with soap and water. ? Do not touch your eyes afterward.  To help relieve pain or itching, you may take the following actions as directed by your health care provider: ?  Apply a cold, wet cloth (cold compress) to affected areas 4-6 times a day. ? Apply a substance that protects your skin and reduces bleeding (astringent). ? Apply a gel that helps relieve pain around sores (lidocaine gel). ? Take a warm, shallow bath that cleans the genital area (sitz bath).  Keep all follow-up visits as told by your health care provider. This is important. How is this  prevented?  Use condoms. Although anyone can get genital herpes during sexual contact, even with the use of a condom, a condom can provide some protection.  Avoid having multiple sexual partners.  Talk with your sexual partner about any symptoms either of you may have. Also, talk with your partner about any history of STIs.  Get tested for STIs before you have sex. Ask your partner to do the same.  Do not have sexual contact if you have symptoms of genital herpes. Contact a health care provider if:  Your symptoms are not improving with medicine.  Your symptoms return.  You have new symptoms.  You have a fever.  You have abdominal pain.  You have redness, swelling, or pain in your eye.  You notice new sores on other parts of your body.  You are a woman and experience bleeding between menstrual periods.  You have had herpes and you become pregnant or plan to become pregnant. Summary  Genital herpes is a common sexually transmitted infection (STI) that is caused by the herpes simplex virus (HSV) type 1 or type 2.  These viruses are most often spread through sexual contact with an infected person.  You are more likely to develop this condition if you have sex with many partners or you have unprotected sex.  Most people do not have symptoms (asymptomatic) or have mild symptoms that may be mistaken for other skin problems. Symptoms occur as outbreaks that may happen months or years apart.  There is no cure for this condition, but treatment with oral antiviral medicines can reduce symptoms, reduce the chance of spreading the virus to a partner, prevent future outbreaks, or shorten future outbreaks. This information is not intended to replace advice given to you by your health care provider. Make sure you discuss any questions you have with your health care provider. Document Released: 02/17/2000 Document Revised: 01/20/2016 Document Reviewed: 01/20/2016 Elsevier Interactive  Patient Education  2019 [employer].

## 2018-04-07 NOTE — Progress Notes (Signed)
Patient ID: [name], female    DOB: 1970-04-25  MRN: [id number]  CC: genital herpes  Subjective: [name] is a 48 y.o. female who presents for UC visit Her concerns today include:  Patient with history of OSA, GERD, bipolar, obesity, tobacco dependence, [place] trait, uterine fibroid  Pt c/o 2 herpes outbreak in the past 2 mths.  Both episodes came on around time of her menstrual cycle and occurred on the right buttock.  She describes it as a cluster of painful vesicles.  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  This is the first time that she is noticing outbreaks occurring on the buttocks and not on the vaginal area.  She has taken a picture for me to see as she is currently on her menstrual cycle.    Patient Active Problem List   Diagnosis Date Noted  . Other tear of medial meniscus, current injury, left knee, subsequent encounter 03/27/2018  . Fracture of tibial plateau, closed 03/08/2011  . GERD (gastroesophageal reflux disease) 03/08/2011  . Bipolar disorder ([place]) 03/08/2011  . OSA (obstructive sleep apnea) 03/08/2011  . Obesity 03/08/2011  . Nicotine dependence 03/08/2011     Current Outpatient Medications on File Prior to Visit  Medication Sig Dispense Refill  . amLODipine (NORVASC) 5 MG tablet Take 1 tablet (5 mg total) by mouth daily. 30 tablet 5  . atorvastatin (LIPITOR) 40 MG tablet Take 1 tablet (40 mg total) by mouth daily. 90 tablet 3  . diclofenac sodium (VOLTAREN) 1 % GEL Apply 4 g topically 4 (four) times daily. 1 Tube 1  . gabapentin (NEURONTIN) 100 MG capsule Take 100 mg by mouth 4 (four) times daily.    . hydrochlorothiazide (HYDRODIURIL) 12.5 MG tablet Take 1 tablet (12.5 mg total) by mouth daily. 30 tablet 5  . HYDROcodone-acetaminophen (NORCO/VICODIN) 5-325 MG tablet Take 1-2 tablets by mouth every 6 (six) hours as needed for moderate pain. 40 tablet 0  . QUEtiapine (SEROQUEL) 50 MG  tablet Take 50 mg by mouth at bedtime.    . varenicline (CHANTIX CONTINUING MONTH PAK) 1 MG tablet Take 1 tablet (1 mg total) by mouth 2 (two) times daily. 60 tablet 3   No current facility-administered medications on file prior to visit.     Allergies  Allergen Reactions  . Aspirin Other (See Comments)    "chilhood allergy"  . Banana Hives  . Latex Hives  . Penicillins Swelling    Has patient had a PCN reaction causing immediate rash, facial/tongue/throat swelling, SOB or lightheadedness with hypotension: unknown Has patient had a PCN reaction causing severe rash involving mucus membranes or skin necrosis: unknown Has patient had a PCN reaction that required hospitalization unknown Has patient had a PCN reaction occurring within the last 10 years: no If all of the above answers are "NO", then may proceed with Cephalosporin use.   Sarina Ill [Bactrim] Itching and Swelling  . Shellfish Allergy Other (See Comments)    No "reaction" tested positive  . Strawberry Extract Hives  . Tape Other (See Comments)    Skin peels away if paper tape is left on for long period of time    Social History   Socioeconomic History  . Marital status: Significant Other    Spouse name: Not on file  . Number of children: 4  . Years of education: Not on file  . Highest education level:  Not on file  Occupational History  . Not on file  Social Needs  . Financial resource strain: Not on file  . Food insecurity:    Worry: Not on file    Inability: Not on file  . Transportation needs:    Medical: Not on file    Non-medical: Not on file  Tobacco Use  . Smoking status: Current Every Day Smoker    Packs/day: 0.50    Years: 24.00    Pack years: 12.00    Types: Cigarettes  . Smokeless tobacco: Never Used  Substance and Sexual Activity  . Alcohol use: Yes    Comment: drinks liquor everyday  . Drug use: Not Currently    Types: "Crack" cocaine    Comment: none since 2000  . Sexual activity: Not on  file  Lifestyle  . Physical activity:    Days per week: Not on file    Minutes per session: Not on file  . Stress: Not on file  Relationships  . Social connections:    Talks on phone: Not on file    Gets together: Not on file    Attends religious service: Not on file    Active member of club or organization: Not on file    Attends meetings of clubs or organizations: Not on file    Relationship status: Not on file  . Intimate partner violence:    Fear of current or ex partner: Not on file    Emotionally abused: Not on file    Physically abused: Not on file    Forced sexual activity: Not on file  Other Topics Concern  . Not on file  Social History Narrative  . Not on file    Family History  Problem Relation Age of Onset  . Thyroid disease Father   . Colon cancer Father   . Diabetes Other   . Cancer Other   . Cystic fibrosis Sister   . Heart disease Brother   . Esophageal cancer Paternal Grandmother   . Anesthesia problems Neg Hx   . Breast cancer Neg Hx     Past Surgical History:  Procedure Laterality Date  . CESAREAN SECTION    . KNEE ARTHROSCOPY  06/15/2011   Procedure: ARTHROSCOPY KNEE;  Surgeon: Rozanna Box, MD;  Location: [facility];  Service: Orthopedics;  Laterality: Left;  LEFT KNEE SCOPE WITH LYSIS OF ADHESIONS AND MANIPULATION  . KNEE ARTHROSCOPY WITH MEDIAL MENISECTOMY Left 03/27/2018   Procedure: LEFT KNEE ARTHROSCOPY WITH PARTIAL MEDIAL MENISCECTOMY;  Surgeon: Mcarthur Rossetti, MD;  Location: [facility];  Service: Orthopedics;  Laterality: Left;  . ORIF TIBIA PLATEAU  03/08/2011   Procedure: OPEN REDUCTION INTERNAL FIXATION (ORIF) TIBIAL PLATEAU;  Surgeon: Rozanna Box;  Location: [facility];  Service: Orthopedics;  Laterality: Left;  . TUBAL LIGATION      ROS: Review of Systems Negative except as above. PHYSICAL EXAM: BP 133/83 (BP Location: Left Arm, Patient Position: Sitting, Cuff Size: Large)   Pulse 89   Temp 98.1 F (36.7 C)  (Oral)   Ht 5\' 7"  (1.702 m)   Wt 222 lb 3.2 oz (100.8 kg)   SpO2 100%   BMI 34.80 kg/m   Physical Exam  General appearance - alert, well appearing, and in no distress Mental status - normal mood, behavior, speech, dress, motor activity, and thought processes Skin -small crop of vesicles noted on the right buttock between the gluteal fold   ASSESSMENT AND PLAN: 1. Herpes  simplex infection We discussed having her take acyclovir daily for suppressive therapy given that she has had 2 outbreaks in the past 2 months.  Patient is agreeable to this.   We discussed good hand hygiene. Advised to avoid sexual intimacy during outbreaks.  Patient reports that her significant other is aware of her history of genital herpes.  Printed information given - acyclovir (ZOVIRAX) 400 MG tablet; Take 1 tablet (400 mg total) by mouth 3 (three) times daily.  Dispense: 15 tablet; Refill: 0 - acyclovir (ZOVIRAX) 400 MG tablet; Take 1 tablet (400 mg total) by mouth 2 (two) times daily.  Dispense: 60 tablet; Refill: 2    Patient was given the opportunity to ask questions.  Patient verbalized understanding of the plan and was able to repeat key elements of the plan.   No orders of the defined types were placed in this encounter.    Requested Prescriptions   Signed Prescriptions Disp Refills  . acyclovir (ZOVIRAX) 400 MG tablet 15 tablet 0    Sig: Take 1 tablet (400 mg total) by mouth 3 (three) times daily.  Marland Kitchen acyclovir (ZOVIRAX) 400 MG tablet 60 tablet 2    Sig: Take 1 tablet (400 mg total) by mouth 2 (two) times daily.    Return in about 2 months (around 06/06/2018).  Karle Plumber, MD, FACP

## 2018-04-10 ENCOUNTER — Ambulatory Visit: Payer: Self-pay | Admitting: Gynecology

## 2018-05-07 ENCOUNTER — Emergency Department (HOSPITAL_COMMUNITY)
Admission: EM | Admit: 2018-05-07 | Discharge: 2018-05-07 | Disposition: A | Discharge status: Home / Self Care | Payer: No Typology Code available for payment source | Attending: Emergency Medicine | Admitting: Emergency Medicine

## 2018-05-07 ENCOUNTER — Encounter (HOSPITAL_COMMUNITY): Payer: Self-pay | Admitting: Emergency Medicine

## 2018-05-07 ENCOUNTER — Other Ambulatory Visit: Payer: Self-pay

## 2018-05-07 ENCOUNTER — Encounter (HOSPITAL_COMMUNITY): Payer: Self-pay | Admitting: Physician Assistant

## 2018-05-07 ENCOUNTER — Emergency Department (HOSPITAL_COMMUNITY): Payer: No Typology Code available for payment source

## 2018-05-07 DIAGNOSIS — Z79899 Other long term (current) drug therapy: Secondary | ICD-10-CM | POA: Insufficient documentation

## 2018-05-07 DIAGNOSIS — F1010 Alcohol abuse, uncomplicated: Secondary | ICD-10-CM | POA: Insufficient documentation

## 2018-05-07 DIAGNOSIS — I129 Hypertensive chronic kidney disease with stage 1 through stage 4 chronic kidney disease, or unspecified chronic kidney disease: Secondary | ICD-10-CM | POA: Insufficient documentation

## 2018-05-07 DIAGNOSIS — N189 Chronic kidney disease, unspecified: Secondary | ICD-10-CM | POA: Insufficient documentation

## 2018-05-07 DIAGNOSIS — F17210 Nicotine dependence, cigarettes, uncomplicated: Secondary | ICD-10-CM | POA: Insufficient documentation

## 2018-05-07 DIAGNOSIS — R1013 Epigastric pain: Secondary | ICD-10-CM | POA: Insufficient documentation

## 2018-05-07 DIAGNOSIS — R112 Nausea with vomiting, unspecified: Secondary | ICD-10-CM | POA: Insufficient documentation

## 2018-05-07 DIAGNOSIS — D573 Sickle-cell trait: Secondary | ICD-10-CM | POA: Insufficient documentation

## 2018-05-07 DIAGNOSIS — R197 Diarrhea, unspecified: Secondary | ICD-10-CM | POA: Insufficient documentation

## 2018-05-07 DIAGNOSIS — K2920 Alcoholic gastritis without bleeding: Secondary | ICD-10-CM | POA: Insufficient documentation

## 2018-05-07 DIAGNOSIS — F319 Bipolar disorder, unspecified: Secondary | ICD-10-CM | POA: Insufficient documentation

## 2018-05-07 DIAGNOSIS — Z91040 Latex allergy status: Secondary | ICD-10-CM | POA: Insufficient documentation

## 2018-05-07 LAB — COMPREHENSIVE METABOLIC PANEL
ALT: 33 U/L (ref 0–44)
AST: 50 U/L — ABNORMAL HIGH (ref 15–41)
Albumin: 3.8 g/dL (ref 3.5–5.0)
Alkaline Phosphatase: 116 U/L (ref 38–126)
Anion gap: 10 (ref 5–15)
BUN: 10 mg/dL (ref 6–20)
CO2: 23 mmol/L (ref 22–32)
Calcium: 9.3 mg/dL (ref 8.9–10.3)
Chloride: 104 mmol/L (ref 98–111)
Creatinine, Ser: 0.78 mg/dL (ref 0.44–1.00)
GFR calc Af Amer: >60 mL/min (ref >60)
GFR calc non Af Amer: >60 mL/min (ref >60)
Glucose, Bld: 111 mg/dL — ABNORMAL HIGH (ref 70–99)
Potassium: 3.7 mmol/L (ref 3.5–5.1)
Sodium: 137 mmol/L (ref 135–145)
Total Bilirubin: 0.5 mg/dL (ref 0.3–1.2)
Total Protein: 7.7 g/dL (ref 6.5–8.1)

## 2018-05-07 LAB — I-STAT BETA HCG BLOOD, ED (MC, WL, AP ONLY): I-stat hCG, quantitative: <5 m[IU]/mL (ref <5)

## 2018-05-07 LAB — URINALYSIS, ROUTINE W REFLEX MICROSCOPIC
Glucose, UA: NEGATIVE mg/dL
Ketones, ur: NEGATIVE mg/dL
Nitrite: NEGATIVE
Protein, ur: 30 mg/dL — AB
Specific Gravity, Urine: 1.03 (ref 1.005–1.030)
pH: 5 (ref 5.0–8.0)

## 2018-05-07 LAB — LIPASE, BLOOD: Lipase: 19 U/L (ref 11–51)

## 2018-05-07 LAB — CBC
HCT: 35.5 % — ABNORMAL LOW (ref 36.0–46.0)
Hemoglobin: 11 g/dL — ABNORMAL LOW (ref 12.0–15.0)
MCH: 25.2 pg — ABNORMAL LOW (ref 26.0–34.0)
MCHC: 31 g/dL (ref 30.0–36.0)
MCV: 81.2 fL (ref 80.0–100.0)
Platelets: 473 10*3/uL — ABNORMAL HIGH (ref 150–400)
RBC: 4.37 MIL/uL (ref 3.87–5.11)
RDW: 19 % — ABNORMAL HIGH (ref 11.5–15.5)
WBC: 8.1 10*3/uL (ref 4.0–10.5)
nRBC: 0.2 % (ref 0.0–0.2)

## 2018-05-07 MED ORDER — MORPHINE SULFATE (PF) 4 MG/ML IV SOLN
4.0000 mg | Freq: Once | INTRAVENOUS | Status: AC
  Administered 2018-05-07: 4 mg via INTRAVENOUS
  Filled 2018-05-07: qty 1

## 2018-05-07 MED ORDER — SODIUM CHLORIDE 0.9% FLUSH: 3.0000 mL | Freq: Once | INTRAVENOUS | Status: DC

## 2018-05-07 MED ORDER — IOHEXOL 300 MG/ML  SOLN
100.0000 mL | Freq: Once | INTRAMUSCULAR | Status: AC | PRN
  Administered 2018-05-07: 100 mL via INTRAVENOUS

## 2018-05-07 MED ORDER — SODIUM CHLORIDE 0.9 % IV BOLUS
1000.0000 mL | Freq: Once | INTRAVENOUS | Status: AC
  Administered 2018-05-07: 1000 mL via INTRAVENOUS

## 2018-05-07 MED ORDER — FENTANYL CITRATE (PF) 100 MCG/2ML IJ SOLN
50.0000 ug | Freq: Once | INTRAMUSCULAR | Status: AC
  Administered 2018-05-07: 50 ug via INTRAVENOUS
  Filled 2018-05-07: qty 2

## 2018-05-07 MED ORDER — ONDANSETRON HCL 4 MG PO TABS: 4.0000 mg | ORAL_TABLET | Freq: Four times a day (QID) | ORAL | 0 refills | Status: DC

## 2018-05-07 MED ORDER — PANTOPRAZOLE SODIUM 20 MG PO TBEC: 20.0000 mg | DELAYED_RELEASE_TABLET | Freq: Every day | ORAL | 0 refills | Status: DC

## 2018-05-07 MED ORDER — ONDANSETRON HCL 4 MG/2ML IJ SOLN
4.0000 mg | Freq: Once | INTRAMUSCULAR | Status: AC
  Administered 2018-05-07: 4 mg via INTRAVENOUS
  Filled 2018-05-07: qty 2

## 2018-05-07 NOTE — ED Provider Notes (Addendum)
[facility] EMERGENCY DEPARTMENT Provider Note   CSN: [id number] Arrival date & time: 05/07/18  1027  History   Chief Complaint Chief Complaint  Patient presents with  . Abdominal Pain    HPI [name] is a 48 y.o. female with a PMH of alcohol abuse, anxiety, HTN, GERD, sickle cell trait, and pancreatitis presenting with constant epigastric sharp abdominal pain radiating to her back onset 3 days ago. Patient reports intermittent nausea, vomiting, and diarrhea onset last night. Patient states she has not taken anything for her symptoms. Patient states eating makes her symptoms worse. Patient states she last ate last night at 8pm. Patient states she drank a lot of alcohol this weekend with her son. Patient states her last drink was on Monday. Patient denies fever, chills, cough, or congestion. Patient denies recent travel or sick contacts. Patient reports tobacco use, but denies drug use. Patient denies dysuria or frequency.     HPI  Past Medical History:  Diagnosis Date  . Alcoholism ([place])   . Anxiety   . Arthritis   . Asthma   . Blood transfusion    1989 at [place]  . Bronchitis   . Chronic bipolar disorder ([place])   . Chronic kidney disease   . Depression    bipolar  . GERD (gastroesophageal reflux disease)   . Headache(784.0)    occasional  . Hypertension   . Mental disorder    bipolar  . MRSA infection 2011   left lower leg  . Pancreatitis   . Pyelonephritis 10/2010  . Recurrent upper respiratory infection (URI)    states she has not been to MD and feels like she has  bronchitis now- greenish sputum  . Shortness of breath    sometimes with exertion  . Sickle cell anemia (HCC)    sickle cell trait   . Sickle cell trait ([place])   . Sleep apnea    CPAP  . Substance abuse ([place])    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  . UTI (lower urinary tract infection) 10/2010    Patient Active Problem List   Diagnosis Date Noted  . Herpes simplex infection 04/07/2018   . Other tear of medial meniscus, current injury, left knee, subsequent encounter 03/27/2018  . Fracture of tibial plateau, closed 03/08/2011  . GERD (gastroesophageal reflux disease) 03/08/2011  . Bipolar disorder ([place]) 03/08/2011  . OSA (obstructive sleep apnea) 03/08/2011  . Obesity 03/08/2011  . Nicotine dependence 03/08/2011    Past Surgical History:  Procedure Laterality Date  . CESAREAN SECTION    . KNEE ARTHROSCOPY  06/15/2011   Procedure: ARTHROSCOPY KNEE;  Surgeon: Rozanna Box, MD;  Location: [facility];  Service: Orthopedics;  Laterality: Left;  LEFT KNEE SCOPE WITH LYSIS OF ADHESIONS AND MANIPULATION  . KNEE ARTHROSCOPY WITH MEDIAL MENISECTOMY Left 03/27/2018   Procedure: LEFT KNEE ARTHROSCOPY WITH PARTIAL MEDIAL MENISCECTOMY;  Surgeon: Mcarthur Rossetti, MD;  Location: [facility];  Service: Orthopedics;  Laterality: Left;  . ORIF TIBIA PLATEAU  03/08/2011   Procedure: OPEN REDUCTION INTERNAL FIXATION (ORIF) TIBIAL PLATEAU;  Surgeon: Rozanna Box;  Location: [facility];  Service: Orthopedics;  Laterality: Left;  . TUBAL LIGATION       OB History   No obstetric history on file.      Home Medications    Prior to Admission medications   Medication Sig Start Date End Date Taking? Authorizing Provider  acyclovir (ZOVIRAX) 400 MG tablet Take 1 tablet (400  mg total) by mouth 3 (three) times daily. 04/07/18   Ladell Pier, MD  acyclovir (ZOVIRAX) 400 MG tablet Take 1 tablet (400 mg total) by mouth 2 (two) times daily. 04/07/18   Ladell Pier, MD  amLODipine (NORVASC) 5 MG tablet Take 1 tablet (5 mg total) by mouth daily. 01/17/18   Clent Demark, PA-C  atorvastatin (LIPITOR) 40 MG tablet Take 1 tablet (40 mg total) by mouth daily. 01/22/18   Clent Demark, PA-C  diclofenac sodium (VOLTAREN) 1 % GEL Apply 4 g topically 4 (four) times daily. 01/08/18   Mcarthur Rossetti, MD  gabapentin (NEURONTIN) 100 MG capsule Take 100 mg by mouth 4 (four)  times daily.    [provider]  hydrochlorothiazide (HYDRODIURIL) 12.5 MG tablet Take 1 tablet (12.5 mg total) by mouth daily. 10/22/17   Clent Demark, PA-C  HYDROcodone-acetaminophen (NORCO/VICODIN) 5-325 MG tablet Take 1-2 tablets by mouth every 6 (six) hours as needed for moderate pain. 03/27/18 03/27/19  Mcarthur Rossetti, MD  ondansetron (ZOFRAN) 4 MG tablet Take 1 tablet (4 mg total) by mouth every 6 (six) hours. 05/07/18   Alveria Apley, PA-C  pantoprazole (PROTONIX) 20 MG tablet Take 1 tablet (20 mg total) by mouth daily for 30 days. 05/07/18 06/06/18  Madilyn Hook A, PA-C  QUEtiapine (SEROQUEL) 50 MG tablet Take 50 mg by mouth at bedtime.    [provider]  varenicline (CHANTIX CONTINUING MONTH PAK) 1 MG tablet Take 1 tablet (1 mg total) by mouth 2 (two) times daily. 12/03/17   Clent Demark, PA-C    Family History Family History  Problem Relation Age of Onset  . Thyroid disease Father   . Colon cancer Father   . Diabetes Other   . Cancer Other   . Cystic fibrosis Sister   . Heart disease Brother   . Esophageal cancer Paternal Grandmother   . Anesthesia problems Neg Hx   . Breast cancer Neg Hx     Social History Social History   Tobacco Use  . Smoking status: Current Every Day Smoker    Packs/day: 0.50    Years: 24.00    Pack years: 12.00    Types: Cigarettes  . Smokeless tobacco: Never Used  Substance Use Topics  . Alcohol use: Yes    Comment: drinks liquor everyday  . Drug use: Not Currently    Types: "Crack" cocaine    Comment: none since 2000     Allergies   Aspirin; Banana; Latex; Penicillins; Septra [bactrim]; Shellfish allergy; Strawberry extract; and Tape   Review of Systems Review of Systems  Constitutional: Positive for chills and diaphoresis. Negative for activity change, appetite change, fever and unexpected weight change.  HENT: Negative for congestion, rhinorrhea and sore throat.   Eyes: Negative for visual  disturbance.  Respiratory: Negative for cough and shortness of breath.   Cardiovascular: Negative for chest pain.  Gastrointestinal: Positive for abdominal pain, diarrhea, nausea and vomiting. Negative for constipation.  Endocrine: Negative for polydipsia, polyphagia and polyuria.  Genitourinary: Negative for dysuria, flank pain and frequency.  Musculoskeletal: Positive for back pain. Negative for gait problem.  Skin: Negative for rash.  Allergic/Immunologic: Negative for immunocompromised state.  Neurological: Negative for dizziness, weakness and headaches.  Psychiatric/Behavioral: The patient is not nervous/anxious.      Physical Exam Updated Vital Signs BP 112/85   Pulse 83   Temp 98.6 F (37 C) (Oral)   Resp 16   SpO2 100%  Physical Exam Vitals signs and nursing note reviewed.  Constitutional:      General: She is in acute distress.     Appearance: She is well-developed. She is not diaphoretic.     Comments: Patient appears uncomfortable during exam due to the abdominal pain.  HENT:     Head: Normocephalic and atraumatic.     Mouth/Throat:     Mouth: Mucous membranes are dry.     Pharynx: Oropharynx is clear. Uvula midline.  Neck:     Musculoskeletal: Normal range of motion and neck supple.  Cardiovascular:     Rate and Rhythm: Normal rate and regular rhythm.     Heart sounds: Normal heart sounds. No murmur. No friction rub. No gallop.   Pulmonary:     Effort: Pulmonary effort is normal. No respiratory distress.     Breath sounds: Normal breath sounds. No wheezing or rales.  Abdominal:     General: Abdomen is protuberant. Bowel sounds are normal. There is no distension.     Palpations: Abdomen is soft. Abdomen is not rigid. There is no mass.     Tenderness: There is abdominal tenderness in the epigastric area. There is guarding. There is no right CVA tenderness, left CVA tenderness or rebound. Negative signs include Murphy's sign and McBurney's sign.     Hernia: No  hernia is present.  Musculoskeletal: Normal range of motion.     Cervical back: Normal. She exhibits normal range of motion, no tenderness and no bony tenderness.     Thoracic back: Normal. She exhibits normal range of motion, no tenderness and no bony tenderness.     Lumbar back: Normal. She exhibits normal range of motion, no tenderness and no bony tenderness.  Skin:    Findings: No rash.  Neurological:     Mental Status: She is alert and oriented to person, place, and time.    ED Treatments / Results  Labs (all labs ordered are listed, but only abnormal results are displayed) Labs Reviewed  COMPREHENSIVE METABOLIC PANEL - Abnormal; Notable for the following components:      Result Value   Glucose, Bld 111 (*)    AST 50 (*)    All other components within normal limits  CBC - Abnormal; Notable for the following components:   Hemoglobin 11.0 (*)    HCT 35.5 (*)    MCH 25.2 (*)    RDW 19.0 (*)    Platelets 473 (*)    All other components within normal limits  URINALYSIS, ROUTINE W REFLEX MICROSCOPIC - Abnormal; Notable for the following components:   Color, Urine AMBER (*)    APPearance HAZY (*)    Hgb urine dipstick MODERATE (*)    Bilirubin Urine SMALL (*)    Protein, ur 30 (*)    Leukocytes,Ua TRACE (*)    Bacteria, UA FEW (*)    All other components within normal limits  URINE CULTURE  LIPASE, BLOOD  I-STAT BETA HCG BLOOD, ED (MC, [facility], AP ONLY)    EKG EKG Interpretation  Date/Time:  Wednesday May 07 2018 11:15:37 EST Ventricular Rate:  88 PR Interval:  104 QRS Duration: 80 QT Interval:  378 QTC Calculation: 457 R Axis:   55 Text Interpretation:  Sinus rhythm with short PR Otherwise normal ECG normal, no change Confirmed by Charlesetta Shanks [phone]) on 05/07/2018 1:39:54 PM   Radiology Ct Abdomen Pelvis W Contrast  Result Date: 05/07/2018 CLINICAL DATA:  48 year old female with acute abdominal and pelvic pain with  nausea and vomiting for 3 days. EXAM: CT ABDOMEN  AND PELVIS WITH CONTRAST TECHNIQUE: Multidetector CT imaging of the abdomen and pelvis was performed using the standard protocol following bolus administration of intravenous contrast. CONTRAST:  120mL OMNIPAQUE IOHEXOL 300 MG/ML  SOLN COMPARISON:  None. FINDINGS: Lower chest: No acute abnormality Hepatobiliary: Probable mild hepatic steatosis noted. No focal hepatic abnormality. The gallbladder is unremarkable. No biliary dilatation. Pancreas: Unremarkable Spleen: Unremarkable Adrenals/Urinary Tract: The kidneys, adrenal glands and bladder are unremarkable. Stomach/Bowel: Stomach is within normal limits. Appendix appears normal. No evidence of bowel wall thickening, distention, or inflammatory changes. Vascular/Lymphatic: No significant vascular findings are present. No enlarged abdominal or pelvic lymph nodes. Reproductive: The uterus and adnexal regions are unremarkable except for a 2 cm subserosal fundal fibroid. Other: No ascites, focal collection or pneumoperitoneum. Musculoskeletal: No acute or suspicious bony abnormalities. IMPRESSION: 1. No acute abnormality 2. Question mild hepatic steatosis 3. 2 cm subserosal fundal fibroid Electronically Signed   By: Margarette Canada M.D.   On: 05/07/2018 16:53    Procedures Procedures (including critical care time)  Medications Ordered in ED Medications  sodium chloride 0.9 % bolus 1,000 mL (0 mLs Intravenous Stopped 05/07/18 1412)  ondansetron (ZOFRAN) injection 4 mg (4 mg Intravenous Given 05/07/18 1212)  fentaNYL (SUBLIMAZE) injection 50 mcg (50 mcg Intravenous Given 05/07/18 1305)  morphine 4 MG/ML injection 4 mg (4 mg Intravenous Given 05/07/18 1544)  iohexol (OMNIPAQUE) 300 MG/ML solution 100 mL (100 mLs Intravenous Contrast Given 05/07/18 1646)     Initial Impression / Assessment and Plan / ED Course  I have reviewed the triage vital signs and the nursing notes.  Pertinent labs & imaging results that were available during my care of the patient were reviewed  by me and considered in my medical decision making (see chart for details).  Clinical Course as of May 07 1201  Wed May 07, 2018  1154 WBCs are within normal limits.   WBC: 8.1 [AH]  1247 Lipase within normal limits.   Lipase, blood [AH]  1247 Low hemoglobin at 11. This appears to be patient's baseline when compared to previous values.  Hemoglobin(!): 11.0 [AH]  1308 Trace leukocytes, moderate hgb, and few bacteria noted on UA. Will order urine culture.  Leukocytes,Ua(!): TRACE [AH]  1325 Patient reports pain has improved with pain medicine.    [NU]  2725 Patient reports pain has improved significantly while in the ER. Patient is able to tolerate clear oral fluids without difficulty. Patient denies nausea.   [AH]  1703 Patient taken over by myself due to shift change.  Patient has epigastric pain and history of pancreatitis and was recently out on an alcohol binge.  Patient is now tolerating oral fluids.  Pain controlled.  Her CT abdomen and pelvis is unremarkable, no acute findings on her lab work.  I believe she probably has alcohol induced gastritis.  We will send her out with some Zofran and a PPI.   [KM]    Clinical Course User Index [AH] Arville Lime, PA-C [KM] Alveria Apley, PA-C      Patient presents with epigastric abdominal pain. WBCs and lipase are within normal limits. Patient's pain and other symptoms adequately managed in emergency department.  Fluid bolus given.  Labs and vitals reviewed. Patient is now comfortable in no acute distress. Pain has been controlled and patient is tolerating fluids without difficulty. On repeat abdominal exam, patient is non tender. CT abdomen is pending.   At shift change  care was transferred to [facility], PA-C who will follow pending studies, re-evaluate and determine disposition.    Final Clinical Impressions(s) / ED Diagnoses   Final diagnoses:  Epigastric pain  Acute alcoholic gastritis without hemorrhage    ED Discharge  Orders         Ordered    pantoprazole (PROTONIX) 20 MG tablet  Daily     05/07/18 1706    ondansetron (ZOFRAN) 4 MG tablet  Every 6 hours     05/07/18 1706           Arville Lime, PA-C 05/07/18 1645    [address] [place], [place] 05/08/18 1203    Charlesetta Shanks, MD 05/10/18 2050

## 2018-05-07 NOTE — ED Triage Notes (Signed)
Patient reports history of pancreatitis, states she drank alcohol this weekend and began having abd pain n/v/d 3 days ago.

## 2018-05-07 NOTE — Discharge Instructions (Addendum)
You have been seen today for abdominal pain. Please read and follow all provided instructions.   1. Medications: zofran for nausea, PPI for gastritis usual home medications 2. Treatment: rest, drink plenty of fluids, NO ALCOHOL 3. Follow Up: Please follow up with GI for abdominal pain. Please follow up with your primary doctor in 2 days for discussion of your diagnoses and further evaluation after today's visit; if you do not have a primary care doctor use the resource guide provided to find one; Please return to the ER for any new or worsening symptoms. Please obtain all of your results from medical records or have your doctors office obtain the results - share them with your doctor - you should be seen at your doctors office. Call today to arrange your follow up.   Take medications as prescribed. Please review all of the medicines and only take them if you do not have an allergy to them. Return to the emergency room for worsening condition or new concerning symptoms. Follow up with your regular doctor. If you don't have a regular doctor use one of the numbers below to establish a primary care doctor.  Please be aware that if you are taking birth control pills, taking other prescriptions, ESPECIALLY ANTIBIOTICS may make the birth control ineffective - if this is the case, either do not engage in sexual activity or use alternative methods of birth control such as condoms until you have finished the medicine and your family doctor says it is OK to restart them. If you are on a blood thinner such as COUMADIN, be aware that any other medicine that you take may cause the coumadin to either work too much, or not enough - you should have your coumadin level rechecked in next 7 days if this is the case.  ?  It is also a possibility that you have an allergic reaction to any of the medicines that you have been prescribed - Everybody reacts differently to medications and while MOST people have no trouble with most  medicines, you may have a reaction such as nausea, vomiting, rash, swelling, shortness of breath. If this is the case, please stop taking the medicine immediately and contact your physician.  ?  You should return to the ER if you develop severe or worsening symptoms.   Emergency Department Resource Guide 1) Find a Doctor and Pay Out of Pocket Although you won't have to find out who is covered by your insurance plan, it is a good idea to ask around and get recommendations. You will then need to call the office and see if the doctor you have chosen will accept you as a new patient and what types of options they offer for patients who are self-pay. Some doctors offer discounts or will set up payment plans for their patients who do not have insurance, but you will need to ask so you aren't surprised when you get to your appointment.  2) Contact Your Local Health Department Not all health departments have doctors that can see patients for sick visits, but many do, so it is worth a call to see if yours does. If you don't know where your local health department is, you can check in your phone book. The CDC also has a tool to help you locate your state's health department, and many state websites also have listings of all of their local health departments.  3) Find a [facility] If your illness is not likely to be very severe or  complicated, you may want to try a walk in clinic. These are popping up all over the country in pharmacies, drugstores, and shopping centers. They're usually staffed by nurse practitioners or physician assistants that have been trained to treat common illnesses and complaints. They're usually fairly quick and inexpensive. However, if you have serious medical issues or chronic medical problems, these are probably not your best option.  No Primary Care Doctor: Call Health Connect at  [phone] - they can help you locate a primary care doctor that  accepts your insurance, provides  certain services, etc. Physician Referral [phone]  Emergency Department Resource Guide 1) Find a Doctor and Pay Out of Pocket Although you won't have to find out who is covered by your insurance plan, it is a good idea to ask around and get recommendations. You will then need to call the office and see if the doctor you have chosen will accept you as a new patient and what types of options they offer for patients who are self-pay. Some doctors offer discounts or will set up payment plans for their patients who do not have insurance, but you will need to ask so you aren't surprised when you get to your appointment.  2) Contact Your Local Health Department Not all health departments have doctors that can see patients for sick visits, but many do, so it is worth a call to see if yours does. If you don't know where your local health department is, you can check in your phone book. The CDC also has a tool to help you locate your state's health department, and many state websites also have listings of all of their local health departments.  3) Find a [facility] If your illness is not likely to be very severe or complicated, you may want to try a walk in clinic. These are popping up all over the country in pharmacies, drugstores, and shopping centers. They're usually staffed by nurse practitioners or physician assistants that have been trained to treat common illnesses and complaints. They're usually fairly quick and inexpensive. However, if you have serious medical issues or chronic medical problems, these are probably not your best option.  No Primary Care Doctor: Call Health Connect at  [phone] - they can help you locate a primary care doctor that  accepts your insurance, provides certain services, etc. Physician Referral Service- [phone]  Chronic Pain Problems: Organization         Address  Phone   Notes  [facility]  [phone] Patients need to be  referred by their primary care doctor.   Medication Assistance: Organization         Address  Phone   Notes  [facility] Medication [facility] [address] [place], [place] [address] [phone] --Must be a resident of [facility] -- Must have NO insurance coverage whatsoever (no Medicaid/ Medicare, etc.) -- The pt. MUST have a primary care doctor that directs their care regularly and follows them in the community   [facility]  [phone]   [employer]  [phone]    Agencies that provide inexpensive medical care: Organization         Address  Phone   Notes  [place]  [phone]   [facility]    [phone]   [facility] [place], [place] [address] [phone]   [facility]  [address], [place] [phone]   [facility]    [phone]   [facility]    [phone]   [facility] Diaz and [facility]  [address], [place] Phone:  [phone], Fax:  [phone] Hours of Operation:  9 am - 6 pm, M-F.  Also accepts Medicaid/Medicare and self-pay.  [facility] [place], [address], [place] Phone: [phone], Fax: [phone]. Hours of Operation:  8:30 am - 5:30 pm, M-F.  Also accepts Medicaid and self-pay.  [facility] [address], [place] Phone: [phone]   [place], [place], [place] [phone], Ext. [phone] Mondays & Thursdays: 7-9 AM.  First 15 patients are seen on a first come, first serve basis.    [place] Providers:  Organization         Address  Phone   Notes  [facility] [address], [place] [phone] Also accepts self-pay patients.  [facility] [address] [place], [place]  [phone]   [place], [address], [place] [phone]   [facility] [address], [place] [phone]   Lucianne Lei [address], [place]   [phone] Only accepts [place] [facility] [place] patients after they have their name applied to their card.   Self-Pay (no insurance) in [facility]:  Organization         Address  Phone   Notes  Sickle Cell Patients, [facility] [place] [phone]   [facility] [place] [phone]   [facility] [place]  [place], [place], [place] [phone]   [facility]/Dr. Osei-Bonsu  [address], [place] or [address], [place] [phone] Phone number for both [place] and [place] locations is the same.  Urgent Medical and [facility] [address] [place] [phone]   [facility] [address], [place] or [address] [phone]   [facility] [address] [place] [phone], phone; [phone], fax Sees patients 1st and 3rd Saturday of every month.  Must not qualify for public or private insurance (i.e. Medicaid, Medicare, [place] Health Choice, Veterans' Benefits)  Household income should be no more than 200% of the poverty level The clinic cannot treat you if you are pregnant or think you are pregnant  Sexually transmitted diseases are not treated at the clinic.

## 2018-05-07 NOTE — ED Notes (Signed)
Declined W/C at D/C and was escorted to lobby by RN. 

## 2018-05-07 NOTE — ED Provider Notes (Signed)
  Physical Exam  BP (!) 149/77 (BP Location: Right Arm)   Pulse 81   Temp 98.5 F (36.9 C) (Oral)   Resp 16   SpO2 100%   Physical Exam  ED Course/Procedures   Clinical Course as of May 06 1708  Wed May 07, 2018  1154 WBCs are within normal limits.   WBC: 8.1 [AH]  1247 Lipase within normal limits.   Lipase, blood [AH]  1247 Low hemoglobin at 11. This appears to be patient's baseline when compared to previous values.  Hemoglobin(!): 11.0 [AH]  1308 Trace leukocytes, moderate hgb, and few bacteria noted on UA. Will order urine culture.  Leukocytes,Ua(!): TRACE [AH]  1325 Patient reports pain has improved with pain medicine.    [TO]  6712 Patient reports pain has improved significantly while in the ER. Patient is able to tolerate clear oral fluids without difficulty. Patient denies nausea.   [AH]  1703 Patient taken over by myself due to shift change.  Patient has epigastric pain and history of pancreatitis and was recently out on an alcohol binge.  Patient is now tolerating oral fluids.  Pain controlled.  Her CT abdomen and pelvis is unremarkable, no acute findings on her lab work.  I believe she probably has alcohol induced gastritis.  We will send her out with some Zofran and a PPI.   [KM]    Clinical Course User Index [AH] Arville Lime, PA-C [KM] Alveria Apley, PA-C    Procedures  MDM        Alveria Apley, PA-C 05/07/18 1711    Charlesetta Shanks, MD 05/18/18 1407

## 2018-05-08 MED FILL — ?ONDANSETRON HCL 4MG TABLET: 4 | 4 days supply | Qty: 12 | Fill #0

## 2018-05-08 MED FILL — PANTOPRAZOLE SOD DR 20 MG T: 20 | 30 days supply | Qty: 30 | Fill #0

## 2018-05-09 ENCOUNTER — Ambulatory Visit (INDEPENDENT_AMBULATORY_CARE_PROVIDER_SITE_OTHER): Payer: Self-pay | Admitting: Primary Care

## 2018-05-09 LAB — URINE CULTURE: Culture: >=100000 — AB

## 2018-05-10 ENCOUNTER — Telehealth: Payer: Self-pay | Admitting: Emergency Medicine

## 2018-05-10 NOTE — Telephone Encounter (Signed)
Post ED Visit - Positive Culture Follow-up  Culture report reviewed by antimicrobial stewardship pharmacist: [facility] Team []  Elenor Quinones, Pharm.D. []  Heide Guile, Pharm.D., BCPS AQ-ID []  Parks Neptune, Pharm.D., BCPS []  Alycia Rossetti, Pharm.D., BCPS [x]  [place], [place].D., BCPS, AAHIVP []  Legrand Como, Pharm.D., BCPS, AAHIVP []  Salome Arnt, PharmD, BCPS []  Johnnette Gourd, PharmD, BCPS []  Hughes Better, PharmD, BCPS []  Leeroy Cha, PharmD []  Laqueta Linden, PharmD, BCPS []  Albertina Parr, PharmD  [place] [facility] Team []  Leodis Sias, PharmD []  Lindell Spar, PharmD []  Royetta Asal, PharmD []  Graylin Shiver, Rph []  Rema Fendt) Glennon Mac, PharmD []  Arlyn Dunning, PharmD []  Netta Cedars, PharmD []  Dia Sitter, PharmD []  Leone Haven, PharmD []  Gretta Arab, PharmD []  Theodis Shove, PharmD []  Peggyann Juba, PharmD []  Reuel Boom, PharmD   Positive urine culture No symptoms of UTI,  no further patient follow-up is required at this time.  [name] 05/10/2018, 2:13 PM

## 2018-05-10 NOTE — Progress Notes (Signed)
ED Antimicrobial Stewardship Positive Culture Follow Up   [name] is an 48 y.o. female who presented to [facility] on 05/07/2018 with a chief complaint of  Chief Complaint  Patient presents with  . Abdominal Pain    Recent Results (from the past 720 hour(s))  Urine Culture     Status: Abnormal   Collection Time: 05/07/18 11:11 AM  Result Value Ref Range Status   Specimen Description URINE, RANDOM  Final   Special Requests   Final    NONE Performed at [facility], [address], [place], [place] [address]    Culture >=100,000 COLONIES/mL ENTEROCOCCUS FAECALIS (A)  Final   Report Status 05/09/2018 FINAL  Final   Organism ID, Bacteria ENTEROCOCCUS FAECALIS (A)  Final      Susceptibility   Enterococcus faecalis - MIC*    AMPICILLIN <=2 SENSITIVE Sensitive     LEVOFLOXACIN 0.5 SENSITIVE Sensitive     NITROFURANTOIN <=16 SENSITIVE Sensitive     VANCOMYCIN 1 SENSITIVE Sensitive     * >=100,000 COLONIES/mL ENTEROCOCCUS FAECALIS   No symptoms of uti no abx indicated  ED Provider: Okey Regal, PA-C  [name] 05/10/2018, 9:02 AM Clinical Pharmacist Monday - Friday phone -  [phone] Saturday - Sunday phone - [phone]

## 2018-05-15 ENCOUNTER — Ambulatory Visit (INDEPENDENT_AMBULATORY_CARE_PROVIDER_SITE_OTHER): Payer: No Typology Code available for payment source | Admitting: Gynecology

## 2018-05-15 ENCOUNTER — Encounter: Payer: Self-pay | Admitting: Gynecology

## 2018-05-15 ENCOUNTER — Other Ambulatory Visit: Payer: Self-pay

## 2018-05-15 VITALS — BP 124/82 | Ht 68.0 in | Wt 224.0 lb

## 2018-05-15 DIAGNOSIS — N921 Excessive and frequent menstruation with irregular cycle: Secondary | ICD-10-CM

## 2018-05-15 DIAGNOSIS — Z01419 Encounter for gynecological examination (general) (routine) without abnormal findings: Secondary | ICD-10-CM

## 2018-05-15 LAB — TSH: TSH: 1.53 mIU/L

## 2018-05-15 NOTE — Patient Instructions (Signed)
Follow-up for the ultrasound as scheduled   Call to Schedule your mammogram  Facilities in [place]: 1)  [facility] [address] [place] Phone: [phone] 2)  Dr. Isaiah Blakes at [facility] [address] Phone: [phone]     Mammogram A mammogram is an X-ray test to find changes in a woman's breast. You should get a mammogram if:  You are 48 years of age or older  You have risk factors.   Your doctor recommends that you have one.  BEFORE THE TEST  Do not schedule the test the week before your period, especially if your breasts are sore during this time.  On the day of your mammogram:  Wash your breasts and armpits well. After washing, do not put on any deodorant or talcum powder on until after your test.   Eat and drink as you usually do.   Take your medicines as usual.   If you are diabetic and take insulin, make sure you:   Eat before coming for your test.   Take your insulin as usual.   If you cannot keep your appointment, call before the appointment to cancel. Schedule another appointment.  TEST  You will need to undress from the waist up. You will put on a hospital gown.   Your breast will be put on the mammogram machine, and it will press firmly on your breast with a piece of plastic called a compression paddle. This will make your breast flatter so that the machine can X-ray all parts of your breast.   Both breasts will be X-rayed. Each breast will be X-rayed from above and from the side. An X-ray might need to be taken again if the picture is not good enough.   The mammogram will last about 15 to 30 minutes.  AFTER THE TEST Finding out the results of your test Ask when your test results will be ready. Make sure you get your test results.  Document Released: 05/18/2008 Document Revised: 02/08/2011 Document Reviewed: 05/18/2008 [facility] Patient Information 2012 [place].

## 2018-05-15 NOTE — Progress Notes (Signed)
    [name] 16-Aug-1970 [id number]        48 y.o.  [id number] new patient for annual gynecologic exam.  Also complaining of heavy menses with some intermenstrual bleeding.  Has been going on for years.  Was told she had fibroids and referred to [place].  In review of her chart she recently had an abdominal pelvic CT which showed a 2 cm fibroid but otherwise was unremarkable from a reproductive standpoint.  History of BTL in the past.  Past medical history,surgical history, problem list, medications, allergies, family history and social history were all reviewed and documented as reviewed in the EPIC chart.  ROS:  Performed with pertinent positives and negatives included in the history, assessment and plan.   Additional significant findings : None   Exam: Caryn Bee assistant Vitals:   05/15/18 1101  BP: 124/82  Weight: 224 lb (101.6 kg)  Height: 5\' 8"  (1.727 m)   Body mass index is 34.06 kg/m.  General appearance:  Normal affect, orientation and appearance. Skin: Grossly normal HEENT: Without gross lesions.  No cervical or supraclavicular adenopathy. Thyroid normal.  Lungs:  Clear without wheezing, rales or rhonchi Cardiac: RR, without RMG Abdominal:  Soft, nontender, without masses, guarding, rebound, organomegaly or hernia Breasts:  Examined lying and sitting without masses, retractions, discharge or axillary adenopathy. Pelvic:  Ext, BUS, Vagina: Normal  Cervix: Normal  Uterus: Anteverted, normal size, shape and contour, midline and mobile nontender   Adnexa: Without masses or tenderness    Anus and perineum: Normal   Rectovaginal: Normal sphincter tone without palpated masses or tenderness.    Assessment/Plan:  48 y.o. [id number] female for annual gynecologic exam.  With irregular heavy menses, tubal sterilization  1. Heavy irregular menses.  Recent CT scan shows small myoma at 2 cm.  Differential reviewed with the patient.  Recommend checking baseline TSH.  Recent  hemoglobin 11.  Patient does have a history of sickle cell trait.  Recommended sonohysterogram for pelvic surveillance and endometrial assessment.  Patient agrees to schedule and follow-up for this. 2. Mammogram never.  Strongly recommended patient schedule a screening mammogram.  Most common cancer in women reviewed.  Benefits of early detection.  Names and numbers provided.  Breast exam normal today. 3. Pap smear 12/2016 normal.  No Pap smear done today.  No history of abnormal Pap smears.  Plan repeat Pap smear next year at 3-year interval per current screening guidelines. 4. Health maintenance.  No routine lab work done as patient does this elsewhere.  Follow-up for ultrasound as scheduled.   Anastasio Auerbach MD, 11:18 AM 05/15/2018

## 2018-05-20 ENCOUNTER — Other Ambulatory Visit: Payer: Self-pay | Admitting: Gynecology

## 2018-05-20 DIAGNOSIS — N939 Abnormal uterine and vaginal bleeding, unspecified: Secondary | ICD-10-CM

## 2018-05-26 ENCOUNTER — Other Ambulatory Visit: Payer: Self-pay

## 2018-05-28 ENCOUNTER — Other Ambulatory Visit: Payer: No Typology Code available for payment source

## 2018-05-28 ENCOUNTER — Ambulatory Visit: Payer: No Typology Code available for payment source | Admitting: Gynecology

## 2018-06-05 ENCOUNTER — Ambulatory Visit (INDEPENDENT_AMBULATORY_CARE_PROVIDER_SITE_OTHER): Payer: No Typology Code available for payment source | Admitting: Primary Care

## 2018-06-06 ENCOUNTER — Ambulatory Visit (INDEPENDENT_AMBULATORY_CARE_PROVIDER_SITE_OTHER): Payer: No Typology Code available for payment source | Admitting: Primary Care

## 2018-06-17 ENCOUNTER — Other Ambulatory Visit: Payer: Self-pay

## 2018-06-17 ENCOUNTER — Ambulatory Visit (HOSPITAL_COMMUNITY)
Admission: EM | Admit: 2018-06-17 | Discharge: 2018-06-17 | Disposition: A | Discharge status: Home / Self Care | Payer: No Typology Code available for payment source | Attending: Family Medicine | Admitting: Family Medicine

## 2018-06-17 ENCOUNTER — Encounter (HOSPITAL_COMMUNITY): Payer: Self-pay | Admitting: Emergency Medicine

## 2018-06-17 DIAGNOSIS — B009 Herpesviral infection, unspecified: Secondary | ICD-10-CM

## 2018-06-17 DIAGNOSIS — M1611 Unilateral primary osteoarthritis, right hip: Secondary | ICD-10-CM

## 2018-06-17 DIAGNOSIS — N939 Abnormal uterine and vaginal bleeding, unspecified: Secondary | ICD-10-CM

## 2018-06-17 DIAGNOSIS — M1712 Unilateral primary osteoarthritis, left knee: Secondary | ICD-10-CM

## 2018-06-17 MED ORDER — MEDROXYPROGESTERONE ACETATE 10 MG PO TABS: 20.0000 mg | ORAL_TABLET | Freq: Three times a day (TID) | ORAL | 0 refills | Status: DC

## 2018-06-17 MED FILL — MEDROXYPROGESTERONE ACETATE: 10 | 7 days supply | Qty: 42 | Fill #0

## 2018-06-17 NOTE — ED Provider Notes (Signed)
[facility]    CSN: [id number] Arrival date & time: 06/17/18  1138     History   Chief Complaint Chief Complaint  Patient presents with  . Vaginal Bleeding    HPI [name] is a 48 y.o. female.   HPI  Patient has had abnormal uterine bleeding for years.  She discussed this with her primary care doctor at their last visit.  She was referred to GYN.  She was evaluated by: Dr. Donalynn Furlong in the Department of gynecology on 05/15/2018.  His note is reviewed.  He recommended a hysterosalpingogram.  The patient lost her insurance and states she was unable to have the test done.  She is frustrated her continued bleeding and cramps.  She is here today asking for referral to a different physician.  I explained to her that a call to Dr. Ronnie Doss might be useful as he may prescribe medicine for her.  I explained that a call to her primary care doctor would also be useful as they could help her with referrals as well.  I am uncertain what she expects at the urgent care center.  I discussed with her taking an anti-inflammatory to help with the cramping, however, she expresses that her mother told her she might be allergic to aspirin, so she is never taken any aspirin products.  Unknown reaction.  Mother is deceased.  We discussed that hormonal therapy might be risky given her cigarette smoking, age, obesity, hypertension.  I do feel comfortable with 10 days of Megace just to stop the bleeding.  Patient is currently on Chantix and states that she will not smoke while on the hormone Past Medical History:  Diagnosis Date  . Alcoholism ([place])   . Anxiety   . Arthritis   . Asthma   . Blood transfusion    1989 at [facility]  . Bronchitis   . Chronic bipolar disorder ([place])   . Chronic kidney disease   . Depression    bipolar  . GERD (gastroesophageal reflux disease)   . Headache(784.0)    occasional  . Hypertension   . Mental disorder    bipolar  . MRSA infection 2011   left lower leg  . Pancreatitis   . Pyelonephritis 10/2010  . Recurrent upper respiratory infection (URI)    states she has not been to MD and feels like she has  bronchitis now- greenish sputum  . Shortness of breath    sometimes with exertion  . Sickle cell anemia ([facility])    sickle cell trait   . Sickle cell trait ([place])   . Sleep apnea    CPAP  . Substance abuse ([place])    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  . UTI (lower urinary tract infection) 10/2010    Patient Active Problem List   Diagnosis Date Noted  . Herpes simplex infection 04/07/2018  . Other tear of medial meniscus, current injury, left knee, subsequent encounter 03/27/2018  . Fracture of tibial plateau, closed 03/08/2011  . GERD (gastroesophageal reflux disease) 03/08/2011  . Bipolar disorder ([place]) 03/08/2011  . OSA (obstructive sleep apnea) 03/08/2011  . Obesity 03/08/2011  . Nicotine dependence 03/08/2011    Past Surgical History:  Procedure Laterality Date  . CESAREAN SECTION    . KNEE ARTHROSCOPY  06/15/2011   Procedure: ARTHROSCOPY KNEE;  Surgeon: Rozanna Box, MD;  Location: [facility];  Service: Orthopedics;  Laterality: Left;  LEFT KNEE SCOPE WITH LYSIS OF ADHESIONS AND MANIPULATION  . KNEE ARTHROSCOPY  WITH MEDIAL MENISECTOMY Left 03/27/2018   Procedure: LEFT KNEE ARTHROSCOPY WITH PARTIAL MEDIAL MENISCECTOMY;  Surgeon: Mcarthur Rossetti, MD;  Location: [facility];  Service: Orthopedics;  Laterality: Left;  . ORIF TIBIA PLATEAU  03/08/2011   Procedure: OPEN REDUCTION INTERNAL FIXATION (ORIF) TIBIAL PLATEAU;  Surgeon: Rozanna Box;  Location: [facility];  Service: Orthopedics;  Laterality: Left;  . TUBAL LIGATION      OB History    Gravida  8   Para  4   Term      Preterm      AB  4   Living  4     SAB  2   TAB  2   Ectopic      Multiple      Live Births               Home Medications    Prior to Admission medications   Medication Sig Start Date End Date Taking? Authorizing Provider   acyclovir (ZOVIRAX) 400 MG tablet Take 1 tablet (400 mg total) by mouth 3 (three) times daily. 04/07/18   Ladell Pier, MD  amLODipine (NORVASC) 5 MG tablet Take 1 tablet (5 mg total) by mouth daily. 01/17/18   Clent Demark, PA-C  atorvastatin (LIPITOR) 40 MG tablet Take 1 tablet (40 mg total) by mouth daily. 01/22/18   Clent Demark, PA-C  gabapentin (NEURONTIN) 100 MG capsule Take 100 mg by mouth 4 (four) times daily.    [provider]  hydrochlorothiazide (HYDRODIURIL) 12.5 MG tablet Take 1 tablet (12.5 mg total) by mouth daily. 10/22/17   Clent Demark, PA-C  medroxyPROGESTERone (PROVERA) 10 MG tablet Take 2 tablets (20 mg total) by mouth 3 (three) times daily. 06/17/18   Raylene Everts, MD  ondansetron (ZOFRAN) 4 MG tablet Take 1 tablet (4 mg total) by mouth every 6 (six) hours. 05/07/18   Alveria Apley, PA-C  QUEtiapine (SEROQUEL) 50 MG tablet Take 50 mg by mouth at bedtime.    [provider]  varenicline (CHANTIX CONTINUING MONTH PAK) 1 MG tablet Take 1 tablet (1 mg total) by mouth 2 (two) times daily. 12/03/17   Clent Demark, PA-C    Family History Family History  Problem Relation Age of Onset  . Thyroid disease Father   . Colon cancer Father   . Cystic fibrosis Sister   . Heart disease Brother   . Esophageal cancer Paternal Grandmother   . Anesthesia problems Neg Hx   . Breast cancer Neg Hx     Social History Social History   Tobacco Use  . Smoking status: Current Every Day Smoker    Packs/day: 0.50    Years: 24.00    Pack years: 12.00    Types: Cigarettes  . Smokeless tobacco: Never Used  Substance Use Topics  . Alcohol use: Yes    Comment: Rare  . Drug use: Not Currently    Types: "Crack" cocaine    Comment: none since 2000     Allergies   Aspirin; Banana; Latex; Penicillins; Septra [bactrim]; Shellfish allergy; Strawberry extract; and Tape   Review of Systems Review of Systems  Constitutional: Negative for  chills and fever.  HENT: Negative for ear pain and sore throat.   Eyes: Negative for pain and visual disturbance.  Respiratory: Negative for cough and shortness of breath.   Cardiovascular: Negative for chest pain and palpitations.  Gastrointestinal: Positive for abdominal distention and abdominal pain. Negative for  vomiting.  Genitourinary: Positive for menstrual problem and vaginal bleeding. Negative for dysuria and hematuria.  Musculoskeletal: Negative for arthralgias and back pain.  Skin: Negative for color change and rash.  Neurological: Negative for seizures and syncope.  All other systems reviewed and are negative.    Physical Exam Triage Vital Signs ED Triage Vitals  Enc Vitals Group     BP 06/17/18 1234 (!) 168/98     Pulse Rate 06/17/18 1234 77     Resp 06/17/18 1234 16     Temp 06/17/18 1234 98.5 F (36.9 C)     Temp src --      SpO2 06/17/18 1234 100 %     Weight --      Height --      Head Circumference --      Peak Flow --      Pain Score 06/17/18 1235 7     Pain Loc --      Pain Edu? --      Excl. in [place]? --    No data found.  Updated Vital Signs BP (!) 168/98   Pulse 77   Temp 98.5 F (36.9 C)   Resp 16   SpO2 100%   Visual Acuity Right Eye Distance:   Left Eye Distance:   Bilateral Distance:    Right Eye Near:   Left Eye Near:    Bilateral Near:     Physical Exam Constitutional:      General: She is not in acute distress.    Appearance: She is well-developed.     Comments: Emotional lability  HENT:     Head: Normocephalic and atraumatic.  Eyes:     Conjunctiva/sclera: Conjunctivae normal.     Pupils: Pupils are equal, round, and reactive to light.  Neck:     Musculoskeletal: Normal range of motion.  Cardiovascular:     Rate and Rhythm: Normal rate.  Pulmonary:     Effort: Pulmonary effort is normal. No respiratory distress.  Abdominal:     General: There is no distension.     Palpations: Abdomen is soft.     Tenderness: There is  abdominal tenderness. There is no guarding or rebound.     Comments: Rounded abdomen.  Mild tender suprapubic region.  No palpable mass  Musculoskeletal: Normal range of motion.  Skin:    General: Skin is warm and dry.  Neurological:     Mental Status: She is alert.      UC Treatments / Results  Labs (all labs ordered are listed, but only abnormal results are displayed) Labs Reviewed - No data to display  EKG None  Radiology No results found.  Procedures Procedures (including critical care time)  Medications Ordered in UC Medications - No data to display  Initial Impression / Assessment and Plan / UC Course  I have reviewed the triage vital signs and the nursing notes.  Pertinent labs & imaging results that were available during my care of the patient were reviewed by me and considered in my medical decision making (see chart for details).     I did describe the limitations of obtaining medical care at the urgent care center for chronic medical problems. Final Clinical Impressions(s) / UC Diagnoses   Final diagnoses:  Abnormal uterine bleeding (AUB)     Discharge Instructions     I have prescribed for you the hormone to stop the bleeding Call the Dr Altamease Oiler to see about different medicines I cannot give you  the naproxen with your aspirin allergy    ED Prescriptions    Medication Sig Dispense Auth. Provider   medroxyPROGESTERone (PROVERA) 10 MG tablet Take 2 tablets (20 mg total) by mouth 3 (three) times daily. 42 tablet Raylene Everts, MD     Controlled Substance Prescriptions [place] Controlled Substance Registry consulted? Not Applicable   Raylene Everts, MD 06/17/18 [phone]

## 2018-06-17 NOTE — ED Triage Notes (Signed)
Pt c/o chronic heavy vaginal bleeding, saw gyno back in march and had scheduled ultrasound with a camera but states she was denied the scan due to not having any insurance. Pt c/o ongoing bleeding.

## 2018-06-17 NOTE — Discharge Instructions (Signed)
I have prescribed for you the hormone to stop the bleeding Call the Dr Altamease Oiler to see about different medicines I cannot give you the naproxen with your aspirin allergy

## 2018-07-23 ENCOUNTER — Ambulatory Visit: Payer: No Typology Code available for payment source | Admitting: Orthopaedic Surgery

## 2018-08-27 ENCOUNTER — Ambulatory Visit (INDEPENDENT_AMBULATORY_CARE_PROVIDER_SITE_OTHER): Payer: No Typology Code available for payment source | Admitting: Primary Care

## 2018-08-27 ENCOUNTER — Encounter (INDEPENDENT_AMBULATORY_CARE_PROVIDER_SITE_OTHER): Payer: Self-pay | Admitting: Primary Care

## 2018-08-27 ENCOUNTER — Other Ambulatory Visit (HOSPITAL_COMMUNITY)
Admission: RE | Admit: 2018-08-27 | Discharge: 2018-08-27 | Disposition: A | Discharge status: Home / Self Care | Payer: No Typology Code available for payment source | Source: Ambulatory Visit | Attending: Primary Care | Admitting: Primary Care

## 2018-08-27 ENCOUNTER — Other Ambulatory Visit: Payer: Self-pay

## 2018-08-27 VITALS — BP 142/88 | HR 81 | Temp 98.3°F | Ht 68.0 in | Wt 218.4 lb

## 2018-08-27 DIAGNOSIS — J302 Other seasonal allergic rhinitis: Secondary | ICD-10-CM

## 2018-08-27 DIAGNOSIS — I10 Essential (primary) hypertension: Secondary | ICD-10-CM

## 2018-08-27 DIAGNOSIS — N898 Other specified noninflammatory disorders of vagina: Secondary | ICD-10-CM | POA: Insufficient documentation

## 2018-08-27 MED ORDER — HYDROCHLOROTHIAZIDE 25 MG PO TABS: 25.0000 mg | ORAL_TABLET | Freq: Every day | ORAL | 0 refills | Status: DC

## 2018-08-27 MED ORDER — CETIRIZINE HCL 10 MG PO TABS: 10.0000 mg | ORAL_TABLET | Freq: Every day | ORAL | 11 refills | Status: DC

## 2018-08-27 MED ORDER — FLUTICASONE PROPIONATE 50 MCG/ACT NA SUSP: 2.0000 | Freq: Every day | NASAL | 6 refills | Status: DC

## 2018-08-27 MED ORDER — METRONIDAZOLE 500 MG PO TABS: 500.0000 mg | ORAL_TABLET | Freq: Two times a day (BID) | ORAL | 0 refills | Status: DC

## 2018-08-27 MED FILL — metroNIDAZOLE 500 MG TABS: 500 | 7 days supply | Qty: 14 | Fill #0

## 2018-08-27 MED FILL — ?CETIRIZINE HCL 10 MG TABLE: 10 | 30 days supply | Qty: 30 | Fill #0

## 2018-08-27 MED FILL — FLUTICASONE PROP 50 MCG SPR: 50 | 30 days supply | Qty: 16 | Fill #0

## 2018-08-27 MED FILL — ?HYDROCHLOROTHIAZIDE 25MG T: 25 | 30 days supply | Qty: 30 | Fill #0
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
## 2018-08-27 NOTE — Patient Instructions (Signed)
Bacterial Vaginosis    Bacterial vaginosis is a vaginal infection that occurs when the normal balance of bacteria in the vagina is disrupted. It results from an overgrowth of certain bacteria. This is the most common vaginal infection among women ages 15-44.  Because bacterial vaginosis increases your risk for STIs (sexually transmitted infections), getting treated can help reduce your risk for chlamydia, gonorrhea, herpes, and HIV (human immunodeficiency virus). Treatment is also important for preventing complications in pregnant women, because this condition can cause an early (premature) delivery.  What are the causes?  This condition is caused by an increase in harmful bacteria that are normally present in small amounts in the vagina. However, the reason that the condition develops is not fully understood.  What increases the risk?  The following factors may make you more likely to develop this condition:  · Having a new sexual partner or multiple sexual partners.  · Having unprotected sex.  · Douching.  · Having an intrauterine device (IUD).  · Smoking.  · Drug and alcohol abuse.  · Taking certain antibiotic medicines.  · Being pregnant.  You cannot get bacterial vaginosis from toilet seats, bedding, swimming pools, or contact with objects around you.  What are the signs or symptoms?  Symptoms of this condition include:  · Grey or white vaginal discharge. The discharge can also be watery or foamy.  · A fish-like odor with discharge, especially after sexual intercourse or during menstruation.  · Itching in and around the vagina.  · Burning or pain with urination.  Some women with bacterial vaginosis have no signs or symptoms.  How is this diagnosed?  This condition is diagnosed based on:  · Your medical history.  · A physical exam of the vagina.  · Testing a sample of vaginal fluid under a microscope to look for a large amount of bad bacteria or abnormal cells. Your health care provider may use a cotton swab or  a small wooden spatula to collect the sample.  How is this treated?  This condition is treated with antibiotics. These may be given as a pill, a vaginal cream, or a medicine that is put into the vagina (suppository). If the condition comes back after treatment, a second round of antibiotics may be needed.  Follow these instructions at home:  Medicines  · Take over-the-counter and prescription medicines only as told by your health care provider.  · Take or use your antibiotic as told by your health care provider. Do not stop taking or using the antibiotic even if you start to feel better.  General instructions  · If you have a female sexual partner, tell her that you have a vaginal infection. She should see her health care provider and be treated if she has symptoms. If you have a female sexual partner, he does not need treatment.  · During treatment:  ? Avoid sexual activity until you finish treatment.  ? Do not douche.  ? Avoid alcohol as directed by your health care provider.  ? Avoid breastfeeding as directed by your health care provider.  · Drink enough water and fluids to keep your urine clear or pale yellow.  · Keep the area around your vagina and rectum clean.  ? Wash the area daily with warm water.  ? Wipe yourself from front to back after using the toilet.  · Keep all follow-up visits as told by your health care provider. This is important.  How is this prevented?  · Do not   douche.  · Wash the outside of your vagina with warm water only.  · Use protection when having sex. This includes latex condoms and dental dams.  · Limit how many sexual partners you have. To help prevent bacterial vaginosis, it is best to have sex with just one partner (monogamous).  · Make sure you and your sexual partner are tested for STIs.  · Wear cotton or cotton-lined underwear.  · Avoid wearing tight pants and pantyhose, especially during summer.  · Limit the amount of alcohol that you drink.  · Do not use any products that contain  nicotine or tobacco, such as cigarettes and e-cigarettes. If you need help quitting, ask your health care provider.  · Do not use illegal drugs.  Where to find more information  · Centers for Disease Control and Prevention: www.cdc.gov/std  · American Sexual Health Association (ASHA): www.ashastd.org  · U.S. Department of Health and Human Services, Office on Women's Health: www.womenshealth.gov/ or https://www.womenshealth.gov/a-z-topics/bacterial-vaginosis  Contact a health care provider if:  · Your symptoms do not improve, even after treatment.  · You have more discharge or pain when urinating.  · You have a fever.  · You have pain in your abdomen.  · You have pain during sex.  · You have vaginal bleeding between periods.  Summary  · Bacterial vaginosis is a vaginal infection that occurs when the normal balance of bacteria in the vagina is disrupted.  · Because bacterial vaginosis increases your risk for STIs (sexually transmitted infections), getting treated can help reduce your risk for chlamydia, gonorrhea, herpes, and HIV (human immunodeficiency virus). Treatment is also important for preventing complications in pregnant women, because the condition can cause an early (premature) delivery.  · This condition is treated with antibiotic medicines. These may be given as a pill, a vaginal cream, or a medicine that is put into the vagina (suppository).  This information is not intended to replace advice given to you by your health care provider. Make sure you discuss any questions you have with your health care provider.  Document Released: 02/19/2005 Document Revised: 06/25/2016 Document Reviewed: 11/05/2015  Elsevier Interactive Patient Education © 2019 [employer]

## 2018-08-28 ENCOUNTER — Other Ambulatory Visit: Payer: Self-pay

## 2018-08-28 ENCOUNTER — Emergency Department (HOSPITAL_COMMUNITY): Payer: No Typology Code available for payment source

## 2018-08-28 ENCOUNTER — Encounter (HOSPITAL_COMMUNITY): Payer: Self-pay | Admitting: Emergency Medicine

## 2018-08-28 ENCOUNTER — Emergency Department (HOSPITAL_COMMUNITY)
Admission: EM | Admit: 2018-08-28 | Discharge: 2018-08-28 | Disposition: A | Discharge status: Home / Self Care | Payer: No Typology Code available for payment source | Attending: Emergency Medicine | Admitting: Emergency Medicine

## 2018-08-28 DIAGNOSIS — N189 Chronic kidney disease, unspecified: Secondary | ICD-10-CM | POA: Insufficient documentation

## 2018-08-28 DIAGNOSIS — J302 Other seasonal allergic rhinitis: Secondary | ICD-10-CM | POA: Insufficient documentation

## 2018-08-28 DIAGNOSIS — I10 Essential (primary) hypertension: Secondary | ICD-10-CM | POA: Insufficient documentation

## 2018-08-28 DIAGNOSIS — R059 Cough, unspecified: Secondary | ICD-10-CM

## 2018-08-28 DIAGNOSIS — Z79899 Other long term (current) drug therapy: Secondary | ICD-10-CM | POA: Insufficient documentation

## 2018-08-28 DIAGNOSIS — Z91040 Latex allergy status: Secondary | ICD-10-CM | POA: Insufficient documentation

## 2018-08-28 DIAGNOSIS — F17210 Nicotine dependence, cigarettes, uncomplicated: Secondary | ICD-10-CM | POA: Insufficient documentation

## 2018-08-28 DIAGNOSIS — I129 Hypertensive chronic kidney disease with stage 1 through stage 4 chronic kidney disease, or unspecified chronic kidney disease: Secondary | ICD-10-CM | POA: Insufficient documentation

## 2018-08-28 DIAGNOSIS — R05 Cough: Secondary | ICD-10-CM

## 2018-08-28 DIAGNOSIS — J45909 Unspecified asthma, uncomplicated: Secondary | ICD-10-CM | POA: Insufficient documentation

## 2018-08-28 DIAGNOSIS — R067 Sneezing: Secondary | ICD-10-CM

## 2018-08-28 DIAGNOSIS — R0602 Shortness of breath: Secondary | ICD-10-CM | POA: Insufficient documentation

## 2018-08-28 LAB — CERVICOVAGINAL ANCILLARY ONLY
Bacterial vaginitis: POSITIVE — AB
Candida vaginitis: NEGATIVE
Chlamydia: NEGATIVE
Neisseria Gonorrhea: NEGATIVE
Trichomonas: NEGATIVE

## 2018-08-28 LAB — BASIC METABOLIC PANEL
Anion gap: 9 (ref 5–15)
BUN: 6 mg/dL (ref 6–20)
CO2: 20 mmol/L — ABNORMAL LOW (ref 22–32)
Calcium: 8.2 mg/dL — ABNORMAL LOW (ref 8.9–10.3)
Chloride: 111 mmol/L (ref 98–111)
Creatinine, Ser: 0.69 mg/dL (ref 0.44–1.00)
GFR calc Af Amer: >60 mL/min (ref >60)
GFR calc non Af Amer: >60 mL/min (ref >60)
Glucose, Bld: 114 mg/dL — ABNORMAL HIGH (ref 70–99)
Potassium: 3.5 mmol/L (ref 3.5–5.1)
Sodium: 140 mmol/L (ref 135–145)

## 2018-08-28 LAB — CBC
HCT: 32.2 % — ABNORMAL LOW (ref 36.0–46.0)
Hemoglobin: 9.9 g/dL — ABNORMAL LOW (ref 12.0–15.0)
MCH: 24.8 pg — ABNORMAL LOW (ref 26.0–34.0)
MCHC: 30.7 g/dL (ref 30.0–36.0)
MCV: 80.7 fL (ref 80.0–100.0)
Platelets: 459 10*3/uL — ABNORMAL HIGH (ref 150–400)
RBC: 3.99 MIL/uL (ref 3.87–5.11)
RDW: 18.9 % — ABNORMAL HIGH (ref 11.5–15.5)
WBC: 5.4 10*3/uL (ref 4.0–10.5)
nRBC: 0 % (ref 0.0–0.2)

## 2018-08-28 LAB — I-STAT BETA HCG BLOOD, ED (MC, WL, AP ONLY): I-stat hCG, quantitative: <5 m[IU]/mL (ref <5)

## 2018-08-28 LAB — TROPONIN I (HIGH SENSITIVITY): Troponin I (High Sensitivity): 2 ng/L (ref <18)

## 2018-08-28 MED ORDER — ALBUTEROL SULFATE HFA 108 (90 BASE) MCG/ACT IN AERS: 2.0000 | INHALATION_SPRAY | Freq: Four times a day (QID) | RESPIRATORY_TRACT | 0 refills | Status: DC | PRN

## 2018-08-28 MED ORDER — PREDNISONE 20 MG PO TABS
60.0000 mg | ORAL_TABLET | Freq: Once | ORAL | Status: AC
  Administered 2018-08-28: 60 mg via ORAL
  Filled 2018-08-28: qty 3

## 2018-08-28 MED ORDER — SODIUM CHLORIDE 0.9% FLUSH: 3.0000 mL | Freq: Once | INTRAVENOUS | Status: DC

## 2018-08-28 MED ORDER — ALBUTEROL SULFATE HFA 108 (90 BASE) MCG/ACT IN AERS
2.0000 | INHALATION_SPRAY | Freq: Once | RESPIRATORY_TRACT | Status: AC
  Administered 2018-08-28: 2 via RESPIRATORY_TRACT
  Filled 2018-08-28: qty 6.7

## 2018-08-28 MED ORDER — PREDNISONE 20 MG PO TABS: ORAL_TABLET | ORAL | 0 refills | Status: DC

## 2018-08-28 NOTE — ED Triage Notes (Signed)
Pt c/o sore throat, cough, shortness of breath and chest pain that started a few days ago and worsened today. Afebrile, no recent travel or sick contacts. States she has been taking meds for her allergies with no relief. Hx asthma

## 2018-08-28 NOTE — Discharge Instructions (Signed)
Take the prescribed medication as directed.  I would continue the nasal spray and allergy medication. Follow-up with your primary care doctor. Return to the ED for new or worsening symptoms.

## 2018-08-28 NOTE — ED Provider Notes (Signed)
[facility] EMERGENCY DEPARTMENT Provider Note   CSN: [id number] Arrival date & time: 08/28/18  2038     History   Chief Complaint Chief Complaint  Patient presents with   Shortness of Breath   Cough    HPI [name] is a 48 y.o. female.     The history is provided by the patient and medical records.     48 year old female with history of alcoholism, anxiety, arthritis, asthma, bipolar disorder, depression, GERD, hypertension, sickle cell trait, chronic kidney disease, presenting to the ED with URI type symptoms.  Specifically she has had dry cough, sneezing, itchy eyes and throat, and feeling of fluid in her ears.  This began a few days ago and she saw her PCP yesterday who prescribed her some allergy medicine and nasal spray but she has not noticed any relief.  States today she got into a coughing fit and had some pain in her left ribs.  She called the nurse hotline and was encouraged to come here for evaluation.  She has no history of coronary artery disease.  She is a daily smoker.  Patient reports this seems to happen to her every year, but each year seems to get worse.  States at one point she was on albuterol inhaler, however her PCP stopped this but she is unsure why.  She denies any sick contacts or known COVID exposures.  She has not had any fever, chills, body aches, nausea, vomiting, or diarrhea.  She is eating and drinking well.  Past Medical History:  Diagnosis Date   Alcoholism ([place])    Anxiety    Arthritis    Asthma    Blood transfusion    1989 at [place]   Bronchitis    Chronic bipolar disorder ([place])    Chronic kidney disease    Depression    bipolar   GERD (gastroesophageal reflux disease)    Headache(784.0)    occasional   Hypertension    Mental disorder    bipolar   MRSA infection 2011   left lower leg   Pancreatitis    Pyelonephritis 10/2010   Recurrent upper respiratory infection (URI)    states  she has not been to MD and feels like she has  bronchitis now- greenish sputum   Shortness of breath    sometimes with exertion   Sickle cell anemia (H[facility])    sickle cell trait    Sickle cell trait ([place])    Sleep apnea    CPAP   Substance abuse ([place])    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   UTI (lower urinary tract infection) 10/2010    Patient Active Problem List   Diagnosis Date Noted   Essential hypertension, benign 08/28/2018   Seasonal allergies 08/28/2018   Herpes simplex infection 04/07/2018   Other tear of medial meniscus, current injury, left knee, subsequent encounter 03/27/2018   Fracture of tibial plateau, closed 03/08/2011   GERD (gastroesophageal reflux disease) 03/08/2011   Bipolar disorder ([place]) 03/08/2011   OSA (obstructive sleep apnea) 03/08/2011   Obesity 03/08/2011   Nicotine dependence 03/08/2011    Past Surgical History:  Procedure Laterality Date   CESAREAN SECTION     KNEE ARTHROSCOPY  06/15/2011   Procedure: ARTHROSCOPY KNEE;  Surgeon: Rozanna Box, MD;  Location: [place];  Service: Orthopedics;  Laterality: Left;  LEFT KNEE SCOPE WITH LYSIS OF ADHESIONS AND MANIPULATION   KNEE ARTHROSCOPY WITH MEDIAL MENISECTOMY Left 03/27/2018   Procedure: LEFT KNEE  ARTHROSCOPY WITH PARTIAL MEDIAL MENISCECTOMY;  Surgeon: Mcarthur Rossetti, MD;  Location: [place];  Service: Orthopedics;  Laterality: Left;   ORIF TIBIA PLATEAU  03/08/2011   Procedure: OPEN REDUCTION INTERNAL FIXATION (ORIF) TIBIAL PLATEAU;  Surgeon: Rozanna Box;  Location: [place];  Service: Orthopedics;  Laterality: Left;   TUBAL LIGATION       OB History    Gravida  8   Para  4   Term      Preterm      AB  4   Living  4     SAB  2   TAB  2   Ectopic      Multiple      Live Births               Home Medications    Prior to Admission medications   Medication Sig Start Date End Date Taking? Authorizing Provider  acyclovir (ZOVIRAX) 400 MG tablet  Take 1 tablet (400 mg total) by mouth 3 (three) times daily. 04/07/18   Ladell Pier, MD  amLODipine (NORVASC) 5 MG tablet Take 1 tablet (5 mg total) by mouth daily. 01/17/18   Clent Demark, PA-C  atorvastatin (LIPITOR) 40 MG tablet Take 1 tablet (40 mg total) by mouth daily. 01/22/18   Clent Demark, PA-C  cetirizine (ZYRTEC) 10 MG tablet Take 1 tablet (10 mg total) by mouth daily. 08/27/18   Kerin Perna, NP  fluticasone (FLONASE) 50 MCG/ACT nasal spray Place 2 sprays into both nostrils daily. 08/27/18   Kerin Perna, NP  gabapentin (NEURONTIN) 100 MG capsule Take 100 mg by mouth 4 (four) times daily.    [provider]  hydrochlorothiazide (HYDRODIURIL) 25 MG tablet Take 1 tablet (25 mg total) by mouth daily. 08/27/18   Kerin Perna, NP  metroNIDAZOLE (FLAGYL) 500 MG tablet Take 1 tablet (500 mg total) by mouth 2 (two) times daily. 08/27/18   Kerin Perna, NP  QUEtiapine (SEROQUEL) 50 MG tablet Take 50 mg by mouth at bedtime.    [provider]  varenicline (CHANTIX CONTINUING MONTH PAK) 1 MG tablet Take 1 tablet (1 mg total) by mouth 2 (two) times daily. 12/03/17   Clent Demark, PA-C    Family History Family History  Problem Relation Age of Onset   Thyroid disease Father    Colon cancer Father    Cystic fibrosis Sister    Heart disease Brother    Esophageal cancer Paternal Grandmother    Anesthesia problems Neg Hx    Breast cancer Neg Hx     Social History Social History   Tobacco Use   Smoking status: Current Every Day Smoker    Packs/day: 0.50    Years: 24.00    Pack years: 12.00    Types: Cigarettes   Smokeless tobacco: Never Used  Substance Use Topics   Alcohol use: Yes    Comment: Rare   Drug use: Not Currently    Types: "Crack" cocaine    Comment: none since 2000     Allergies   Aspirin, Banana, Latex, Penicillins, Septra [bactrim], Shellfish allergy, Strawberry extract, and Tape   Review  of Systems Review of Systems  Respiratory: Positive for cough and shortness of breath.   Cardiovascular: Positive for chest pain (left ribs).  All other systems reviewed and are negative.    Physical Exam Updated Vital Signs BP (!) 137/94 (BP Location: Right Arm)    Pulse  83    Temp 97.9 F (36.6 C) (Oral)    Resp 18    LMP 08/19/2018 (Exact Date)    SpO2 100%   Physical Exam Vitals signs and nursing note reviewed.  Constitutional:      Appearance: She is well-developed.  HENT:     Head: Normocephalic and atraumatic.     Right Ear: Tympanic membrane and ear canal normal.     Ears:     Comments: Fluid behind left TM but appears clear    Mouth/Throat:     Comments: Tonsils overall normal in appearance bilaterally without exudate; uvula midline without evidence of peritonsillar abscess; handling secretions appropriately; no difficulty swallowing or speaking; normal phonation without stridor Eyes:     Conjunctiva/sclera: Conjunctivae normal.     Pupils: Pupils are equal, round, and reactive to light.  Neck:     Musculoskeletal: Normal range of motion.  Cardiovascular:     Rate and Rhythm: Normal rate and regular rhythm.     Heart sounds: Normal heart sounds.  Pulmonary:     Effort: Pulmonary effort is normal.     Breath sounds: Normal breath sounds. No wheezing or rhonchi.     Comments: Lungs are clear but somewhat diminished, she is able to talk in sentences but takes deep breath at the end of conversation, no acute distress Abdominal:     General: Bowel sounds are normal.     Palpations: Abdomen is soft.  Musculoskeletal: Normal range of motion.  Skin:    General: Skin is warm and dry.  Neurological:     Mental Status: She is alert and oriented to person, place, and time.      ED Treatments / Results  Labs (all labs ordered are listed, but only abnormal results are displayed) Labs Reviewed  BASIC METABOLIC PANEL - Abnormal; Notable for the following components:       Result Value   CO2 20 (*)    Glucose, Bld 114 (*)    Calcium 8.2 (*)    All other components within normal limits  CBC - Abnormal; Notable for the following components:   Hemoglobin 9.9 (*)    HCT 32.2 (*)    MCH 24.8 (*)    RDW 18.9 (*)    Platelets 459 (*)    All other components within normal limits  TROPONIN I (HIGH SENSITIVITY)  TROPONIN I (HIGH SENSITIVITY)  I-STAT BETA HCG BLOOD, ED ([facility], [facility], [facility] ONLY)    EKG    Radiology Dg Chest 2 View  Result Date: 08/28/2018 CLINICAL DATA:  Chest pain EXAM: CHEST - 2 VIEW COMPARISON:  August 06, 2017 FINDINGS: The heart size and mediastinal contours are within normal limits. Both lungs are clear. The visualized skeletal structures are unremarkable. IMPRESSION: No active cardiopulmonary disease. Electronically Signed   By: Constance Holster M.D.   On: 08/28/2018 21:41    Procedures Procedures (including critical care time)  Medications Ordered in ED Medications  predniSONE (DELTASONE) tablet 60 mg (60 mg Oral Given 08/28/18 2248)  albuterol (VENTOLIN HFA) 108 (90 Base) MCG/ACT inhaler 2 puff (2 puffs Inhalation Given 08/28/18 2248)     Initial Impression / Assessment and Plan / ED Course  I have reviewed the triage vital signs and the nursing notes.  Pertinent labs & imaging results that were available during my care of the patient were reviewed by me and considered in my medical decision making (see chart for details).  48 year old female here with URI type symptoms  for the past several days.  She reports a dry cough, sneezing, itchy eyes and throat.  She denies any fever, body aches, nausea, diarrhea, or diarrhea.  Had episode of left-sided rib pain today which prompted ER visit.  She is afebrile and nontoxic in appearance here.  She does have some fluid behind left TM but ear does not appear grossly infected.  Her lungs are clear without any wheezes or rhonchi changes and no acute distress.  She is able to talk in full sentences but  does often take deep breaths at the end of conversation.  Her O2 sats remained stable on room air.  Work-up is overall reassuring with grossly normal labs, negative troponin, negative chest x-ray.  EKG without any acute ischemic changes.  Her symptoms seem more allergic/asthma related.  She has not had any sick contacts or known COVID exposures so I feel this is less likely given her overall benign appearance.  Doubt ACS, PE, dissection, acute cardiac event.  I recommended to restart her home albuterol inhaler as needed as well as short prednisone taper.  She should continue her allergy medication and nasal spray given from PCP.  Would follow with PCP next week for re-check.  She can return here for any new or acute changes.  LORRINDA RAMSTAD was evaluated in Emergency Department on 08/28/2018 for the symptoms described in the history of present illness. She was evaluated in the context of the global COVID-19 pandemic, which necessitated consideration that the patient might be at risk for infection with the SARS-CoV-2 virus that causes COVID-19. Institutional protocols and algorithms that pertain to the evaluation of patients at risk for COVID-19 are in a state of rapid change based on information released by regulatory bodies including the CDC and federal and state organizations. These policies and algorithms were followed during the patient's care in the ED.   Final Clinical Impressions(s) / ED Diagnoses   Final diagnoses:  Cough  Sneezing  Shortness of breath    ED Discharge Orders         Ordered    predniSONE (DELTASONE) 20 MG tablet     08/28/18 2249    albuterol (VENTOLIN HFA) 108 (90 Base) MCG/ACT inhaler  Every 6 hours PRN     08/28/18 2249           Larene Pickett, PA-C 08/28/18 2308    Varney Biles, MD 08/29/18 1540

## 2018-08-28 NOTE — ED Notes (Signed)
Patient verbalizes understanding of discharge instructions. Opportunity for questioning and answers were provided. Armband removed by staff, pt discharged from ED ambulatory.   

## 2018-08-29 MED FILL — predniSONE 20 MG TABS: 20 | 9 days supply | Qty: 12 | Fill #0

## 2018-08-29 MED FILL — !VENTOLIN HFA INHALER: 108 (90 BAS | 25 days supply | Qty: 18 | Fill #0

## 2018-09-01 ENCOUNTER — Telehealth (INDEPENDENT_AMBULATORY_CARE_PROVIDER_SITE_OTHER): Payer: Self-pay

## 2018-09-01 ENCOUNTER — Other Ambulatory Visit (INDEPENDENT_AMBULATORY_CARE_PROVIDER_SITE_OTHER): Payer: Self-pay | Admitting: Primary Care

## 2018-09-01 NOTE — Telephone Encounter (Signed)
-----   Message from Kerin Perna, NP sent at 09/01/2018  9:14 AM EDT ----- BV sent in flagyl

## 2018-09-01 NOTE — Telephone Encounter (Signed)
Patient is aware that cytology was negative for STD but positive for BV. Flagyl sent to pharmacy. Patient has already picked up Rx and began taking. Patient did not have any questions. Nat Christen, CMA

## 2018-10-06 ENCOUNTER — Other Ambulatory Visit: Payer: Self-pay

## 2018-10-06 ENCOUNTER — Emergency Department (HOSPITAL_COMMUNITY): Payer: Self-pay

## 2018-10-06 ENCOUNTER — Encounter (HOSPITAL_COMMUNITY): Payer: Self-pay

## 2018-10-06 ENCOUNTER — Emergency Department (HOSPITAL_COMMUNITY)
Admission: EM | Admit: 2018-10-06 | Discharge: 2018-10-06 | Disposition: A | Discharge status: Home / Self Care | Payer: Self-pay | Attending: Emergency Medicine | Admitting: Emergency Medicine

## 2018-10-06 DIAGNOSIS — R079 Chest pain, unspecified: Secondary | ICD-10-CM | POA: Insufficient documentation

## 2018-10-06 DIAGNOSIS — Z5321 Procedure and treatment not carried out due to patient leaving prior to being seen by health care provider: Secondary | ICD-10-CM | POA: Insufficient documentation

## 2018-10-06 LAB — CBC
HCT: 34.6 % — ABNORMAL LOW (ref 36.0–46.0)
Hemoglobin: 10.6 g/dL — ABNORMAL LOW (ref 12.0–15.0)
MCH: 24.8 pg — ABNORMAL LOW (ref 26.0–34.0)
MCHC: 30.6 g/dL (ref 30.0–36.0)
MCV: 80.8 fL (ref 80.0–100.0)
Platelets: 493 10*3/uL — ABNORMAL HIGH (ref 150–400)
RBC: 4.28 MIL/uL (ref 3.87–5.11)
RDW: 18.2 % — ABNORMAL HIGH (ref 11.5–15.5)
WBC: 6.8 10*3/uL (ref 4.0–10.5)
nRBC: 0 % (ref 0.0–0.2)

## 2018-10-06 LAB — COMPREHENSIVE METABOLIC PANEL
ALT: 25 U/L (ref 0–44)
AST: 31 U/L (ref 15–41)
Albumin: 3.7 g/dL (ref 3.5–5.0)
Alkaline Phosphatase: 116 U/L (ref 38–126)
Anion gap: 11 (ref 5–15)
BUN: <5 mg/dL — ABNORMAL LOW (ref 6–20)
CO2: 22 mmol/L (ref 22–32)
Calcium: 9.2 mg/dL (ref 8.9–10.3)
Chloride: 104 mmol/L (ref 98–111)
Creatinine, Ser: 0.82 mg/dL (ref 0.44–1.00)
GFR calc Af Amer: >60 mL/min (ref >60)
GFR calc non Af Amer: >60 mL/min (ref >60)
Glucose, Bld: 124 mg/dL — ABNORMAL HIGH (ref 70–99)
Potassium: 3.8 mmol/L (ref 3.5–5.1)
Sodium: 137 mmol/L (ref 135–145)
Total Bilirubin: 0.9 mg/dL (ref 0.3–1.2)
Total Protein: 7.7 g/dL (ref 6.5–8.1)

## 2018-10-06 LAB — I-STAT BETA HCG BLOOD, ED (MC, WL, AP ONLY): I-stat hCG, quantitative: <5 m[IU]/mL (ref <5)

## 2018-10-06 LAB — LIPASE, BLOOD: Lipase: 26 U/L (ref 11–51)

## 2018-10-06 LAB — TROPONIN I (HIGH SENSITIVITY): Troponin I (High Sensitivity): 4 ng/L (ref <18)

## 2018-10-06 NOTE — ED Triage Notes (Signed)
Pt endorses mid upper abd pain and chest x 1 days. Pt has hx of pancreatitis and drank "A LOT" of alcohol over the weekend. VSS. Also having n/v.

## 2018-10-06 NOTE — ED Notes (Signed)
PT stated she would no longer like to wait and would not like to speak with anybody

## 2018-10-09 ENCOUNTER — Telehealth (HOSPITAL_COMMUNITY): Payer: Self-pay

## 2018-11-04 ENCOUNTER — Encounter (INDEPENDENT_AMBULATORY_CARE_PROVIDER_SITE_OTHER): Payer: Self-pay | Admitting: Primary Care

## 2018-11-04 ENCOUNTER — Telehealth (INDEPENDENT_AMBULATORY_CARE_PROVIDER_SITE_OTHER): Payer: Self-pay | Admitting: Primary Care

## 2018-11-04 DIAGNOSIS — D171 Benign lipomatous neoplasm of skin and subcutaneous tissue of trunk: Secondary | ICD-10-CM

## 2018-11-04 DIAGNOSIS — D1722 Benign lipomatous neoplasm of skin and subcutaneous tissue of left arm: Secondary | ICD-10-CM

## 2018-11-04 DIAGNOSIS — B370 Candidal stomatitis: Secondary | ICD-10-CM

## 2018-11-04 MED ORDER — NYSTATIN 100000 UNIT/GM EX POWD: Freq: Four times a day (QID) | CUTANEOUS | 0 refills | Status: DC

## 2018-11-04 NOTE — Progress Notes (Signed)
Knot gets bigger

## 2018-11-05 ENCOUNTER — Telehealth: Payer: Self-pay | Admitting: Primary Care

## 2018-11-05 MED ORDER — NYSTATIN 100000 UNIT/ML MT SUSP: 5.0000 mL | Freq: Four times a day (QID) | OROMUCOSAL | 0 refills | Status: DC

## 2018-11-05 MED FILL — NYSTATIN 100,000 UNITS/ML S: 100000 | 3 days supply | Qty: 60 | Fill #0

## 2018-11-05 NOTE — Telephone Encounter (Signed)
New Message   Pt came in states she needs to renew her Cafa but when she goes onto the ir website  to get her info they say its blocked. Please f/u

## 2018-11-06 NOTE — Telephone Encounter (Signed)
I returned Pt called, LVM with the IRS numbers [phone] and [phone]

## 2018-11-09 NOTE — Progress Notes (Signed)
Virtual Visit via Telephone Note  I connected with [name] on 11/09/18 at  4:10 PM EDT by telephone and verified that I am speaking with the correct person using two identifiers.   I discussed the limitations, risks, security and privacy concerns of performing an evaluation and management service by telephone and the availability of in person appointments. I also discussed with the patient that there may be a patient responsible charge related to this service. The patient expressed understanding and agreed to proceed. WEB VISIT History of Present Illness: Ms. [name] is having a web visit to be able to visualize her mouth and knott on her back . These are the concerns she described White film in her mouth and knott growing on her back denies pain but itches at time.  Past Medical History:  Diagnosis Date  . Alcoholism ([place])   . Anxiety   . Arthritis   . Asthma   . Blood transfusion    1989 at [place]  . Bronchitis   . Chronic bipolar disorder ([place])   . Chronic kidney disease   . Depression    bipolar  . GERD (gastroesophageal reflux disease)   . Headache(784.0)    occasional  . Hypertension   . Mental disorder    bipolar  . MRSA infection 2011   left lower leg  . Pancreatitis   . Pyelonephritis 10/2010  . Recurrent upper respiratory infection (URI)    states she has not been to MD and feels like she has  bronchitis now- greenish sputum  . Shortness of breath    sometimes with exertion  . Sickle cell anemia ([facility])    sickle cell trait   . Sickle cell trait ([place])   . Sleep apnea    CPAP  . Substance abuse ([place])    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  . UTI (lower urinary tract infection) 10/2010    Observations/Objective: Review of Systems  HENT:       Tongue painted white and the back of her throat and cheeks  Skin: Positive for itching.       Raised area on her back  All other systems reviewed and are negative.   Assessment and Plan: Jalexa was seen today for  thrush and cyst.  Diagnoses and all orders for this visit:  Oral thrush White, slightly raised areas in her  Mouth, tongue coated white  nystatin (MYCOSTATIN) 100000 UNIT/ML suspension; Take 5 mLs (500,000 Units total) by mouth 4 (four) times daily.    Lipoma of left upper extremity Lipoma is a knot of fatty tissue that is usually found just below the skin (subcutaneous). Lipomas can occur almost anywhere on the body, but are most commonly found on the trunk, shoulders, neck, and armpits. Lipomas can rarely form in muscles and internal organs. A lipoma is a  movable  rubbery bulge   Other order nystatin (MYCOSTATIN) 100000 UNIT/ML suspension; Take 5 mLs (500,000 Units total) by mouth 4 (four) times daily.   -     nystatin (MYCOSTATIN/NYSTOP) powder; Apply topically 4 (four) times daily. -      Follow Up Instructions:    I discussed the assessment and treatment plan with the patient. The patient was provided an opportunity to ask questions and all were answered. The patient agreed with the plan and demonstrated an understanding of the instructions.   The patient was advised to call back or seek an in-person evaluation if the symptoms worsen or if the condition fails  to improve as anticipated.  I provided 18  minutes of face-to-face time during this encounter.   Kerin Perna, NP

## 2018-11-25 ENCOUNTER — Encounter: Payer: Self-pay | Admitting: Gynecology

## 2018-11-25 MED FILL — MIRTAZAPINE 15 MG TABS: 15 | 30 days supply | Qty: 30 | Fill #0

## 2018-11-25 MED FILL — GABAPENTIN 300 MG CAPSULE: 300 | 30 days supply | Qty: 90 | Fill #0

## 2018-11-25 MED FILL — ?AMLODIPINE BESYLATE 5MG TA: 5 | 30 days supply | Qty: 30 | Fill #0

## 2018-11-25 MED FILL — ?ATORVASTATIN 40MG TABLET: 40 | 30 days supply | Qty: 30 | Fill #0

## 2019-01-05 MED FILL — ACYCLOVIR 400 MG TABLET: 400 | 30 days supply | Qty: 60 | Fill #1

## 2019-01-09 ENCOUNTER — Other Ambulatory Visit: Payer: Self-pay | Admitting: Gastroenterology

## 2019-01-09 DIAGNOSIS — K920 Hematemesis: Secondary | ICD-10-CM

## 2019-01-09 DIAGNOSIS — R1310 Dysphagia, unspecified: Secondary | ICD-10-CM

## 2019-01-09 DIAGNOSIS — D649 Anemia, unspecified: Secondary | ICD-10-CM

## 2019-01-09 DIAGNOSIS — R1084 Generalized abdominal pain: Secondary | ICD-10-CM

## 2019-01-09 DIAGNOSIS — R739 Hyperglycemia, unspecified: Secondary | ICD-10-CM

## 2019-01-09 DIAGNOSIS — D259 Leiomyoma of uterus, unspecified: Secondary | ICD-10-CM

## 2019-01-09 DIAGNOSIS — K852 Alcohol induced acute pancreatitis without necrosis or infection: Secondary | ICD-10-CM

## 2019-01-31 ENCOUNTER — Emergency Department (HOSPITAL_COMMUNITY)
Admission: EM | Admit: 2019-01-31 | Discharge: 2019-02-01 | Disposition: A | Discharge status: Home / Self Care | Payer: Self-pay | Attending: Emergency Medicine | Admitting: Emergency Medicine

## 2019-01-31 ENCOUNTER — Other Ambulatory Visit: Payer: Self-pay

## 2019-01-31 ENCOUNTER — Encounter (HOSPITAL_COMMUNITY): Payer: Self-pay | Admitting: *Deleted

## 2019-01-31 DIAGNOSIS — Z5321 Procedure and treatment not carried out due to patient leaving prior to being seen by health care provider: Secondary | ICD-10-CM | POA: Insufficient documentation

## 2019-01-31 DIAGNOSIS — R1031 Right lower quadrant pain: Secondary | ICD-10-CM | POA: Insufficient documentation

## 2019-01-31 LAB — I-STAT BETA HCG BLOOD, ED (MC, WL, AP ONLY): I-stat hCG, quantitative: <5 m[IU]/mL (ref <5)

## 2019-01-31 LAB — URINALYSIS, ROUTINE W REFLEX MICROSCOPIC
Bacteria, UA: NONE SEEN
Bilirubin Urine: NEGATIVE
Glucose, UA: NEGATIVE mg/dL
Ketones, ur: 20 mg/dL — AB
Leukocytes,Ua: NEGATIVE
Nitrite: NEGATIVE
Protein, ur: 30 mg/dL — AB
Specific Gravity, Urine: 1.024 (ref 1.005–1.030)
pH: 6 (ref 5.0–8.0)

## 2019-01-31 LAB — COMPREHENSIVE METABOLIC PANEL
ALT: 21 U/L (ref 0–44)
AST: 29 U/L (ref 15–41)
Albumin: 3.5 g/dL (ref 3.5–5.0)
Alkaline Phosphatase: 112 U/L (ref 38–126)
Anion gap: 13 (ref 5–15)
BUN: 6 mg/dL (ref 6–20)
CO2: 21 mmol/L — ABNORMAL LOW (ref 22–32)
Calcium: 8.7 mg/dL — ABNORMAL LOW (ref 8.9–10.3)
Chloride: 103 mmol/L (ref 98–111)
Creatinine, Ser: 0.77 mg/dL (ref 0.44–1.00)
GFR calc Af Amer: >60 mL/min (ref >60)
GFR calc non Af Amer: >60 mL/min (ref >60)
Glucose, Bld: 90 mg/dL (ref 70–99)
Potassium: 3.1 mmol/L — ABNORMAL LOW (ref 3.5–5.1)
Sodium: 137 mmol/L (ref 135–145)
Total Bilirubin: 0.8 mg/dL (ref 0.3–1.2)
Total Protein: 7.2 g/dL (ref 6.5–8.1)

## 2019-01-31 LAB — CBC
HCT: 32.1 % — ABNORMAL LOW (ref 36.0–46.0)
Hemoglobin: 10.3 g/dL — ABNORMAL LOW (ref 12.0–15.0)
MCH: 26.2 pg (ref 26.0–34.0)
MCHC: 32.1 g/dL (ref 30.0–36.0)
MCV: 81.7 fL (ref 80.0–100.0)
Platelets: 413 10*3/uL — ABNORMAL HIGH (ref 150–400)
RBC: 3.93 MIL/uL (ref 3.87–5.11)
RDW: 21 % — ABNORMAL HIGH (ref 11.5–15.5)
WBC: 8.1 10*3/uL (ref 4.0–10.5)
nRBC: 0 % (ref 0.0–0.2)

## 2019-01-31 LAB — LIPASE, BLOOD: Lipase: 39 U/L (ref 11–51)

## 2019-01-31 MED ORDER — SODIUM CHLORIDE 0.9% FLUSH: 3.0000 mL | Freq: Once | INTRAVENOUS | Status: DC

## 2019-01-31 NOTE — ED Triage Notes (Signed)
The pt is c/o abd and rt flank pain for 3-4 days  She thinks she has pancreatitis  She last had alcohol yesterday  lmp yesterday

## 2019-02-01 ENCOUNTER — Emergency Department (HOSPITAL_COMMUNITY): Payer: Self-pay

## 2019-02-01 ENCOUNTER — Emergency Department (HOSPITAL_COMMUNITY)
Admission: EM | Admit: 2019-02-01 | Discharge: 2019-02-01 | Disposition: A | Discharge status: Home / Self Care | Payer: Self-pay | Attending: Emergency Medicine | Admitting: Emergency Medicine

## 2019-02-01 ENCOUNTER — Other Ambulatory Visit: Payer: Self-pay

## 2019-02-01 ENCOUNTER — Encounter (HOSPITAL_COMMUNITY): Payer: Self-pay | Admitting: Emergency Medicine

## 2019-02-01 DIAGNOSIS — R1084 Generalized abdominal pain: Secondary | ICD-10-CM | POA: Insufficient documentation

## 2019-02-01 DIAGNOSIS — Z79899 Other long term (current) drug therapy: Secondary | ICD-10-CM | POA: Insufficient documentation

## 2019-02-01 DIAGNOSIS — Z91040 Latex allergy status: Secondary | ICD-10-CM | POA: Insufficient documentation

## 2019-02-01 DIAGNOSIS — F17210 Nicotine dependence, cigarettes, uncomplicated: Secondary | ICD-10-CM | POA: Insufficient documentation

## 2019-02-01 DIAGNOSIS — I10 Essential (primary) hypertension: Secondary | ICD-10-CM | POA: Insufficient documentation

## 2019-02-01 LAB — CBC WITH DIFFERENTIAL/PLATELET
Abs Immature Granulocytes: 0 10*3/uL (ref 0.00–0.07)
Basophils Absolute: 0 10*3/uL (ref 0.0–0.1)
Basophils Relative: 0 %
Eosinophils Absolute: 0.1 10*3/uL (ref 0.0–0.5)
Eosinophils Relative: 1 %
HCT: 29.8 % — ABNORMAL LOW (ref 36.0–46.0)
Hemoglobin: 9.5 g/dL — ABNORMAL LOW (ref 12.0–15.0)
Lymphocytes Relative: 23 %
Lymphs Abs: 1.5 10*3/uL (ref 0.7–4.0)
MCH: 26.2 pg (ref 26.0–34.0)
MCHC: 31.9 g/dL (ref 30.0–36.0)
MCV: 82.3 fL (ref 80.0–100.0)
Monocytes Absolute: 0.3 10*3/uL (ref 0.1–1.0)
Monocytes Relative: 4 %
Neutro Abs: 4.8 10*3/uL (ref 1.7–7.7)
Neutrophils Relative %: 72 %
Platelets: 363 10*3/uL (ref 150–400)
RBC: 3.62 MIL/uL — ABNORMAL LOW (ref 3.87–5.11)
RDW: 21.2 % — ABNORMAL HIGH (ref 11.5–15.5)
WBC: 6.7 10*3/uL (ref 4.0–10.5)
nRBC: 0 /100 WBC
nRBC: 0.3 % — ABNORMAL HIGH (ref 0.0–0.2)

## 2019-02-01 LAB — URINALYSIS, ROUTINE W REFLEX MICROSCOPIC
Bacteria, UA: NONE SEEN
Glucose, UA: NEGATIVE mg/dL
Hgb urine dipstick: NEGATIVE
Ketones, ur: 5 mg/dL — AB
Leukocytes,Ua: NEGATIVE
Nitrite: NEGATIVE
Protein, ur: 30 mg/dL — AB
Specific Gravity, Urine: 1.03 (ref 1.005–1.030)
pH: 5 (ref 5.0–8.0)

## 2019-02-01 LAB — COMPREHENSIVE METABOLIC PANEL
ALT: 18 U/L (ref 0–44)
AST: 29 U/L (ref 15–41)
Albumin: 3 g/dL — ABNORMAL LOW (ref 3.5–5.0)
Alkaline Phosphatase: 91 U/L (ref 38–126)
Anion gap: 10 (ref 5–15)
BUN: 7 mg/dL (ref 6–20)
CO2: 23 mmol/L (ref 22–32)
Calcium: 8.3 mg/dL — ABNORMAL LOW (ref 8.9–10.3)
Chloride: 104 mmol/L (ref 98–111)
Creatinine, Ser: 0.64 mg/dL (ref 0.44–1.00)
GFR calc Af Amer: >60 mL/min (ref >60)
GFR calc non Af Amer: >60 mL/min (ref >60)
Glucose, Bld: 110 mg/dL — ABNORMAL HIGH (ref 70–99)
Potassium: 3.4 mmol/L — ABNORMAL LOW (ref 3.5–5.1)
Sodium: 137 mmol/L (ref 135–145)
Total Bilirubin: 0.5 mg/dL (ref 0.3–1.2)
Total Protein: 6.4 g/dL — ABNORMAL LOW (ref 6.5–8.1)

## 2019-02-01 LAB — LIPASE, BLOOD: Lipase: 31 U/L (ref 11–51)

## 2019-02-01 MED ORDER — PANTOPRAZOLE SODIUM 20 MG PO TBEC: 20.0000 mg | DELAYED_RELEASE_TABLET | Freq: Every day | ORAL | 0 refills | Status: DC

## 2019-02-01 MED ORDER — ONDANSETRON HCL 4 MG/2ML IJ SOLN
4.0000 mg | Freq: Once | INTRAMUSCULAR | Status: AC
  Administered 2019-02-01: 12:00:00 4 mg via INTRAVENOUS
  Filled 2019-02-01: qty 2

## 2019-02-01 MED ORDER — SODIUM CHLORIDE 0.9 % IV BOLUS
1000.0000 mL | Freq: Once | INTRAVENOUS | Status: AC
  Administered 2019-02-01: 12:00:00 1000 mL via INTRAVENOUS

## 2019-02-01 MED ORDER — SODIUM CHLORIDE 0.9% FLUSH: 3.0000 mL | Freq: Once | INTRAVENOUS | Status: DC

## 2019-02-01 MED ORDER — HYDROMORPHONE HCL 1 MG/ML IJ SOLN
1.0000 mg | Freq: Once | INTRAMUSCULAR | Status: AC
  Administered 2019-02-01: 1 mg via INTRAVENOUS
  Filled 2019-02-01: qty 1

## 2019-02-01 MED ORDER — IOHEXOL 300 MG/ML  SOLN
100.0000 mL | Freq: Once | INTRAMUSCULAR | Status: AC | PRN
  Administered 2019-02-01: 100 mL via INTRAVENOUS

## 2019-02-01 NOTE — Discharge Instructions (Addendum)
Protonix as prescribed and follow-up with your provider.

## 2019-02-01 NOTE — ED Triage Notes (Signed)
PT reports generalized abdominal pain, back pain with breathing, and right  Upper quadrant flank/abd pain. Pt also endorses fevers and chills. Had labs drawn here last night.

## 2019-02-01 NOTE — ED Provider Notes (Signed)
[facility] EMERGENCY DEPARTMENT Provider Note   CSN: [id number] Arrival date & time: 02/01/19  0901     History   Chief Complaint Chief Complaint  Patient presents with  . Abdominal Pain  . Back Pain    HPI [name] is a 48 y.o. female.     48 year old female presents with complaint of upper abdominal pain radiates around to her back x2 days.  Patient also reports having occasional sweats and chills with nausea and vomiting.  Pain is worse with taking a deep breath or with palpation of her stomach, nothing makes pain better, has not taken anything for her pain.  Pain is sharp in nature.  Patient denies chest pain or shortness of breath, changes in bowel or bladder habits.  Patient reports similar pain previously when she had pancreatitis, last drink alcohol yesterday.  No prior abdominal surgeries.  No other complaints or concerns.     Past Medical History:  Diagnosis Date  . Alcoholism ([place])   . Anxiety   . Arthritis   . Asthma   . Blood transfusion    1989 at [place]  . Bronchitis   . Chronic bipolar disorder ([place])   . Chronic kidney disease   . Depression    bipolar  . GERD (gastroesophageal reflux disease)   . Headache(784.0)    occasional  . Hypertension   . Mental disorder    bipolar  . MRSA infection 2011   left lower leg  . Pancreatitis   . Pyelonephritis 10/2010  . Recurrent upper respiratory infection (URI)    states she has not been to MD and feels like she has  bronchitis now- greenish sputum  . Shortness of breath    sometimes with exertion  . Sickle cell anemia (HCC)    sickle cell trait   . Sickle cell trait ([place])   . Sleep apnea    CPAP  . Substance abuse ([place])    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  . UTI (lower urinary tract infection) 10/2010    Patient Active Problem List   Diagnosis Date Noted  . Essential hypertension, benign 08/28/2018  . Seasonal allergies 08/28/2018  . Herpes simplex infection 04/07/2018  .  Other tear of medial meniscus, current injury, left knee, subsequent encounter 03/27/2018  . Fracture of tibial plateau, closed 03/08/2011  . GERD (gastroesophageal reflux disease) 03/08/2011  . Bipolar disorder ([place]) 03/08/2011  . OSA (obstructive sleep apnea) 03/08/2011  . Obesity 03/08/2011  . Nicotine dependence 03/08/2011    Past Surgical History:  Procedure Laterality Date  . CESAREAN SECTION    . KNEE ARTHROSCOPY  06/15/2011   Procedure: ARTHROSCOPY KNEE;  Surgeon: Rozanna Box, MD;  Location: [facility];  Service: Orthopedics;  Laterality: Left;  LEFT KNEE SCOPE WITH LYSIS OF ADHESIONS AND MANIPULATION  . KNEE ARTHROSCOPY WITH MEDIAL MENISECTOMY Left 03/27/2018   Procedure: LEFT KNEE ARTHROSCOPY WITH PARTIAL MEDIAL MENISCECTOMY;  Surgeon: Mcarthur Rossetti, MD;  Location: [facility];  Service: Orthopedics;  Laterality: Left;  . ORIF TIBIA PLATEAU  03/08/2011   Procedure: OPEN REDUCTION INTERNAL FIXATION (ORIF) TIBIAL PLATEAU;  Surgeon: Rozanna Box;  Location: [facility];  Service: Orthopedics;  Laterality: Left;  . TUBAL LIGATION       OB History    Gravida  8   Para  4   Term      Preterm      AB  4   Living  4  SAB  2   TAB  2   Ectopic      Multiple      Live Births               Home Medications    Prior to Admission medications   Medication Sig Start Date End Date Taking? Authorizing Provider  albuterol (VENTOLIN HFA) 108 (90 Base) MCG/ACT inhaler Inhale 2 puffs into the lungs every 6 (six) hours as needed for wheezing. 08/28/18  Yes Larene Pickett, PA-C  amLODipine (NORVASC) 5 MG tablet Take 1 tablet (5 mg total) by mouth daily. 01/17/18  Yes Clent Demark, PA-C  atorvastatin (LIPITOR) 40 MG tablet Take 1 tablet (40 mg total) by mouth daily. 01/22/18  Yes Clent Demark, PA-C  cetirizine (ZYRTEC) 10 MG tablet Take 1 tablet (10 mg total) by mouth daily. 08/27/18  Yes Kerin Perna, NP  gabapentin (NEURONTIN) 100 MG  capsule Take 100 mg by mouth 4 (four) times daily.   Yes [provider]  hydrochlorothiazide (HYDRODIURIL) 25 MG tablet Take 1 tablet (25 mg total) by mouth daily. 08/27/18  Yes Kerin Perna, NP  fluticasone (FLONASE) 50 MCG/ACT nasal spray Place 2 sprays into both nostrils daily. 08/27/18   Kerin Perna, NP  pantoprazole (PROTONIX) 20 MG tablet Take 1 tablet (20 mg total) by mouth daily. 02/01/19 03/03/19  Tacy Learn, PA-C    Family History Family History  Problem Relation Age of Onset  . Thyroid disease Father   . Colon cancer Father   . Cystic fibrosis Sister   . Heart disease Brother   . Esophageal cancer Paternal Grandmother   . Anesthesia problems Neg Hx   . Breast cancer Neg Hx     Social History Social History   Tobacco Use  . Smoking status: Current Every Day Smoker    Packs/day: 0.50    Years: 24.00    Pack years: 12.00    Types: Cigarettes  . Smokeless tobacco: Never Used  Substance Use Topics  . Alcohol use: Yes    Comment: Rare  . Drug use: Not Currently    Types: "Crack" cocaine    Comment: none since 2000     Allergies   Aspirin, Banana, Latex, Penicillins, Septra [bactrim], Shellfish allergy, Strawberry extract, and Tape   Review of Systems Review of Systems  Constitutional: Positive for chills and diaphoresis. Negative for fever.  Respiratory: Negative for shortness of breath.   Cardiovascular: Negative for chest pain.  Gastrointestinal: Positive for abdominal pain, nausea and vomiting. Negative for constipation and diarrhea.  Genitourinary: Negative for dysuria and frequency.  Musculoskeletal: Positive for back pain.  Skin: Negative for rash and wound.  Allergic/Immunologic: Negative for immunocompromised state.  Neurological: Negative for headaches.  Hematological: Negative for adenopathy.  Psychiatric/Behavioral: Negative for confusion.  All other systems reviewed and are negative.    Physical Exam Updated Vital  Signs BP (!) 150/79   Pulse 86   Temp 98.4 F (36.9 C) (Oral)   Resp 16   LMP 01/30/2019   SpO2 100%   Physical Exam Vitals signs and nursing note reviewed.  Constitutional:      General: She is not in acute distress.    Appearance: She is well-developed. She is not diaphoretic.  HENT:     Head: Normocephalic and atraumatic.  Cardiovascular:     Rate and Rhythm: Normal rate and regular rhythm.     Heart sounds: Normal heart sounds.  Pulmonary:  Effort: Pulmonary effort is normal.     Breath sounds: Normal breath sounds.  Abdominal:     Palpations: Abdomen is soft.     Tenderness: There is generalized abdominal tenderness. There is right CVA tenderness. There is no left CVA tenderness.  Skin:    General: Skin is warm and dry.     Findings: No rash.  Neurological:     Mental Status: She is alert and oriented to person, place, and time.  Psychiatric:        Behavior: Behavior normal.      ED Treatments / Results  Labs (all labs ordered are listed, but only abnormal results are displayed) Labs Reviewed  CBC WITH DIFFERENTIAL/PLATELET - Abnormal; Notable for the following components:      Result Value   RBC 3.62 (*)    Hemoglobin 9.5 (*)    HCT 29.8 (*)    RDW 21.2 (*)    nRBC 0.3 (*)    All other components within normal limits  COMPREHENSIVE METABOLIC PANEL - Abnormal; Notable for the following components:   Potassium 3.4 (*)    Glucose, Bld 110 (*)    Calcium 8.3 (*)    Total Protein 6.4 (*)    Albumin 3.0 (*)    All other components within normal limits  URINALYSIS, ROUTINE W REFLEX MICROSCOPIC - Abnormal; Notable for the following components:   Color, Urine AMBER (*)    APPearance HAZY (*)    Bilirubin Urine SMALL (*)    Ketones, ur 5 (*)    Protein, ur 30 (*)    All other components within normal limits  LIPASE, BLOOD    EKG None  Radiology Ct Abdomen Pelvis W Contrast  Result Date: 02/01/2019 CLINICAL DATA:  Abdominal pain, primarily  right-sided.  Fever. EXAM: CT ABDOMEN AND PELVIS WITH CONTRAST TECHNIQUE: Multidetector CT imaging of the abdomen and pelvis was performed using the standard protocol following bolus administration of intravenous contrast. CONTRAST:  139mL OMNIPAQUE IOHEXOL 300 MG/ML  SOLN COMPARISON:  May 07, 2018 FINDINGS: Lower chest: Lung bases are clear. Hepatobiliary: There is hepatic steatosis with fatty infiltration somewhat more severe at the fissure for the ligamentum teres than elsewhere. No focal liver lesions are demonstrable. Gallbladder wall is not appreciably thickened. There is no biliary duct dilatation. Pancreas: There is no pancreatic mass or inflammatory focus. Spleen: No splenic lesions are evident. Adrenals/Urinary Tract: Adrenals bilaterally appear normal. There is no evident renal mass or hydronephrosis on either side. No well-defined renal calculi are evident. Contrast which is present in each collecting system could obscure small renal calculi. No ureteral calculi are evident on either side. Urinary bladder is midline with wall thickness within normal limits for nearly empty bladder state. Stomach/Bowel: There is no appreciable bowel wall or mesenteric thickening. No evident bowel obstruction. Terminal ileum appears normal. There is no free air or portal venous air. Vascular/Lymphatic: There is no abdominal aortic aneurysm. No vascular lesions are evident on this study. There is no adenopathy appreciable in the abdomen or pelvis by size criteria. Subcentimeter inguinal lymph nodes are considered nonspecific. Reproductive: Is anteverted. The uterus has an inhomogeneous appearance consistent with leiomyomatous change. There is a leiomyoma extending anteriorly from the superior uterine fundus measuring 2.5 x 2.4 cm. There is an apparent leiomyoma arising from the inferior uterus measuring approximately 3 x 3 cm. Other less well-defined areas of apparent leiomyomatous change evident. There is no extrauterine  pelvic mass. Other: The appendix appears normal. No abscess or  ascites is evident in the abdomen or pelvis. There is fat in the umbilicus. Musculoskeletal: There are no blastic or lytic bone lesions. 1 No intramuscular lesions are evident. IMPRESSION: 1. No demonstrable bowel obstruction. No abscess in the abdomen or pelvis. Appendix appears normal. 2. No renal calculi with the proviso that small calculi in the kidneys could be obscured by contrast. No ureteral calculi. No hydronephrosis. Urinary bladder wall thickness within normal limits. 3.  Leiomyomatous uterus. 4.  Hepatic steatosis. Electronically Signed   By: Lowella Grip III M.D.   On: 02/01/2019 13:31    Procedures Procedures (including critical care time)  Medications Ordered in ED Medications  sodium chloride 0.9 % bolus 1,000 mL (1,000 mLs Intravenous New Bag/Given 02/01/19 1216)  HYDROmorphone (DILAUDID) injection 1 mg (1 mg Intravenous Given 02/01/19 1211)  ondansetron (ZOFRAN) injection 4 mg (4 mg Intravenous Given 02/01/19 1211)  iohexol (OMNIPAQUE) 300 MG/ML solution 100 mL (100 mLs Intravenous Contrast Given 02/01/19 1245)     Initial Impression / Assessment and Plan / ED Course  I have reviewed the triage vital signs and the nursing notes.  Pertinent labs & imaging results that were available during my care of the patient were reviewed by me and considered in my medical decision making (see chart for details).  Clinical Course as of Feb 01 1428  Sun Jan 31, 3765  2353 48 year old female presents with complaint of abdominal pain which radiates to her back, similar to previous episodes of pancreatitis.  Patient was in the emergency room last night, had lab work done but did not stay due to prolonged wait and return today.  On exam patient has generalized abdominal tenderness.  Labs without significant changes from baseline and previous includes CBC, CMP, lipase.  Urinalysis with ketones and protein, no evidence of urinary  tract infection.  CT abdomen pelvis without significant findings, does see fibroid uterus however patient is having upper abdominal pain.  Discussed results with patient who has had some relief with her pain medication while in the ER.  Plan is to start Protonix and follow-up with PCP.   [LM]    Clinical Course User Index [LM] Tacy Learn, PA-C        Final Clinical Impressions(s) / ED Diagnoses   Final diagnoses:  Generalized abdominal pain    ED Discharge Orders         Ordered    pantoprazole (PROTONIX) 20 MG tablet  Daily     02/01/19 1427           Tacy Learn, PA-C 02/01/19 Malone, [place], DO 02/01/19 1517

## 2019-02-01 NOTE — ED Notes (Signed)
Patient verbalizes understanding of discharge instructions . Opportunity for questions and answers were provided . Armband removed by staff ,Pt discharged from ED. W/C  offered at D/C  and Declined W/C at D/C and was escorted to lobby by RN.  

## 2019-02-02 MED FILL — PANTOPRAZOLE SOD DR 20 MG T: 20 | 30 days supply | Qty: 30 | Fill #0

## 2019-02-04 ENCOUNTER — Telehealth (INDEPENDENT_AMBULATORY_CARE_PROVIDER_SITE_OTHER): Payer: Self-pay | Admitting: Primary Care

## 2019-02-04 DIAGNOSIS — B370 Candidal stomatitis: Secondary | ICD-10-CM

## 2019-02-04 NOTE — Progress Notes (Signed)
Virtual Visit via Telephone Note  I connected with [name] on 02/04/19 at 11:10 AM EST by telephone and verified that I am speaking with the correct person using two identifiers.   I discussed the limitations, risks, security and privacy concerns of performing an evaluation and management service by telephone and the availability of in person appointments. I also discussed with the patient that there may be a patient responsible charge related to this service. The patient expressed understanding and agreed to proceed.   History of Present Illness: [name] is having a web visit for a white coat on her tongue had patient to brush her tongue color decrease asked did she smoke yes. Does not appear to be thrush.    Past Medical History:  Diagnosis Date  . Alcoholism ([place])   . Anxiety   . Arthritis   . Asthma   . Blood transfusion    1989 at [place]  . Bronchitis   . Chronic bipolar disorder ([place])   . Chronic kidney disease   . Depression    bipolar  . GERD (gastroesophageal reflux disease)   . Headache(784.0)    occasional  . Hypertension   . Mental disorder    bipolar  . MRSA infection 2011   left lower leg  . Pancreatitis   . Pyelonephritis 10/2010  . Recurrent upper respiratory infection (URI)    states she has not been to MD and feels like she has  bronchitis now- greenish sputum  . Shortness of breath    sometimes with exertion  . Sickle cell anemia (H[facility])    sickle cell trait   . Sickle cell trait ([place])   . Sleep apnea    CPAP  . Substance abuse ([place])    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  . UTI (lower urinary tract infection) 10/2010   Observations/Objective: Review of Systems  HENT:       Concerned with whitish tongue  All other systems reviewed and are negative.   Assessment and Plan: Diagnoses and all orders for this visit:  Oral thrush Does not appear to be thrush use Listerine if does not improved have a in person visit.  Follow Up  Instructions:    I discussed the assessment and treatment plan with the patient. The patient was provided an opportunity to ask questions and all were answered. The patient agreed with the plan and demonstrated an understanding of the instructions.   The patient was advised to call back or seek an in-person evaluation if the symptoms worsen or if the condition fails to improve as anticipated.  I provided 10 minutes of non-face-to-face time during this encounter.   Kerin Perna, NP

## 2019-06-10 ENCOUNTER — Other Ambulatory Visit (HOSPITAL_COMMUNITY)
Admission: RE | Admit: 2019-06-10 | Discharge: 2019-06-10 | Disposition: A | Discharge status: Home / Self Care | Payer: Self-pay | Source: Ambulatory Visit | Attending: Primary Care | Admitting: Primary Care

## 2019-06-10 ENCOUNTER — Other Ambulatory Visit: Payer: Self-pay

## 2019-06-10 ENCOUNTER — Encounter (INDEPENDENT_AMBULATORY_CARE_PROVIDER_SITE_OTHER): Payer: Self-pay | Admitting: Primary Care

## 2019-06-10 ENCOUNTER — Ambulatory Visit (INDEPENDENT_AMBULATORY_CARE_PROVIDER_SITE_OTHER): Payer: Self-pay | Admitting: Primary Care

## 2019-06-10 VITALS — BP 168/106 | HR 69 | Temp 97.3°F | Ht 68.0 in | Wt 211.2 lb

## 2019-06-10 DIAGNOSIS — Z0000 Encounter for general adult medical examination without abnormal findings: Secondary | ICD-10-CM

## 2019-06-10 DIAGNOSIS — Z113 Encounter for screening for infections with a predominantly sexual mode of transmission: Secondary | ICD-10-CM

## 2019-06-10 DIAGNOSIS — Z1231 Encounter for screening mammogram for malignant neoplasm of breast: Secondary | ICD-10-CM

## 2019-06-10 DIAGNOSIS — Z124 Encounter for screening for malignant neoplasm of cervix: Secondary | ICD-10-CM | POA: Insufficient documentation

## 2019-06-10 DIAGNOSIS — I10 Essential (primary) hypertension: Secondary | ICD-10-CM

## 2019-06-10 DIAGNOSIS — F17200 Nicotine dependence, unspecified, uncomplicated: Secondary | ICD-10-CM

## 2019-06-10 MED ORDER — AMLODIPINE BESYLATE 10 MG PO TABS: ORAL_TABLET | ORAL | 1 refills | Status: DC

## 2019-06-10 MED ORDER — CHLORTHALIDONE 50 MG PO TABS: 50.0000 mg | ORAL_TABLET | Freq: Every day | ORAL | 1 refills | Status: DC

## 2019-06-10 MED FILL — CHLORTHALIDONE 50 MG TABS: 50 | 30 days supply | Qty: 30 | Fill #0

## 2019-06-10 MED FILL — AMLODIPINE BESYLATE 10 MG T: 10 | 30 days supply | Qty: 30 | Fill #0

## 2019-06-10 NOTE — Patient Instructions (Signed)
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
## 2019-06-11 ENCOUNTER — Other Ambulatory Visit (INDEPENDENT_AMBULATORY_CARE_PROVIDER_SITE_OTHER): Payer: Self-pay | Admitting: Primary Care

## 2019-06-11 LAB — CBC WITH DIFFERENTIAL/PLATELET
Basophils Absolute: 0.1 10*3/uL (ref 0.0–0.2)
Basos: 1 %
EOS (ABSOLUTE): 0.1 10*3/uL (ref 0.0–0.4)
Eos: 1 %
Hematocrit: 36.2 % (ref 34.0–46.6)
Hemoglobin: 11.2 g/dL (ref 11.1–15.9)
Immature Grans (Abs): 0 10*3/uL (ref 0.0–0.1)
Immature Granulocytes: 0 %
Lymphocytes Absolute: 3.1 10*3/uL (ref 0.7–3.1)
Lymphs: 48 %
MCH: 25.7 pg — ABNORMAL LOW (ref 26.6–33.0)
MCHC: 30.9 g/dL — ABNORMAL LOW (ref 31.5–35.7)
MCV: 83 fL (ref 79–97)
Monocytes Absolute: 0.4 10*3/uL (ref 0.1–0.9)
Monocytes: 7 %
Neutrophils Absolute: 2.8 10*3/uL (ref 1.4–7.0)
Neutrophils: 43 %
Platelets: 401 10*3/uL (ref 150–450)
RBC: 4.35 x10E6/uL (ref 3.77–5.28)
RDW: 21.2 % — ABNORMAL HIGH (ref 11.7–15.4)
WBC: 6.5 10*3/uL (ref 3.4–10.8)

## 2019-06-11 LAB — CERVICOVAGINAL ANCILLARY ONLY
Bacterial Vaginitis (gardnerella): POSITIVE — AB
Candida Glabrata: NEGATIVE
Candida Vaginitis: NEGATIVE
Chlamydia: NEGATIVE
Comment: NEGATIVE
Comment: NEGATIVE
Comment: NEGATIVE
Comment: NEGATIVE
Comment: NEGATIVE
Comment: NORMAL
Neisseria Gonorrhea: NEGATIVE
Trichomonas: NEGATIVE

## 2019-06-11 LAB — CMP14+EGFR
ALT: 30 IU/L (ref 0–32)
AST: 65 IU/L — ABNORMAL HIGH (ref 0–40)
Albumin/Globulin Ratio: 1.4 (ref 1.2–2.2)
Albumin: 4.1 g/dL (ref 3.8–4.8)
Alkaline Phosphatase: 140 IU/L — ABNORMAL HIGH (ref 39–117)
BUN/Creatinine Ratio: 7 — ABNORMAL LOW (ref 9–23)
BUN: 5 mg/dL — ABNORMAL LOW (ref 6–24)
Bilirubin Total: 0.2 mg/dL (ref 0.0–1.2)
CO2: 22 mmol/L (ref 20–29)
Calcium: 8.7 mg/dL (ref 8.7–10.2)
Chloride: 104 mmol/L (ref 96–106)
Creatinine, Ser: 0.72 mg/dL (ref 0.57–1.00)
GFR calc Af Amer: 114 mL/min/{1.73_m2} (ref >59)
GFR calc non Af Amer: 99 mL/min/{1.73_m2} (ref >59)
Globulin, Total: 2.9 g/dL (ref 1.5–4.5)
Glucose: 103 mg/dL — ABNORMAL HIGH (ref 65–99)
Potassium: 4.2 mmol/L (ref 3.5–5.2)
Sodium: 141 mmol/L (ref 134–144)
Total Protein: 7 g/dL (ref 6.0–8.5)

## 2019-06-11 LAB — LIPID PANEL
Chol/HDL Ratio: 2.7 ratio (ref 0.0–4.4)
Cholesterol, Total: 170 mg/dL (ref 100–199)
HDL: 62 mg/dL (ref >39)
LDL Chol Calc (NIH): 87 mg/dL (ref 0–99)
Triglycerides: 121 mg/dL (ref 0–149)
VLDL Cholesterol Cal: 21 mg/dL (ref 5–40)

## 2019-06-11 MED ORDER — METRONIDAZOLE 500 MG PO TABS: 500.0000 mg | ORAL_TABLET | Freq: Two times a day (BID) | ORAL | 0 refills | Status: DC

## 2019-06-12 MED FILL — metroNIDAZOLE 500 MG TABS: 500 | 7 days supply | Qty: 14 | Fill #0

## 2019-06-15 ENCOUNTER — Telehealth (INDEPENDENT_AMBULATORY_CARE_PROVIDER_SITE_OTHER): Payer: Self-pay

## 2019-06-15 LAB — CYTOLOGY - PAP
Comment: NEGATIVE
Diagnosis: NEGATIVE
High risk HPV: NEGATIVE

## 2019-06-15 NOTE — Telephone Encounter (Signed)
Patient called an states SOB has gotten worse with medication. Patient was advice to take 1/2 of pill per PCP. Patient voiced understanding and will follow up in 2 weeks.

## 2019-06-15 NOTE — Telephone Encounter (Signed)
Patient states PCP prescribed a new medication ( amLODipine (NORVASC) 10 MG tablet )  Patient states her Shortness of breath has worsen and is feeling light headed. Patient states her blood pressure has only been high one time after starting this medication.  Please advice [phone]

## 2019-06-15 NOTE — Telephone Encounter (Signed)
Sent to PCP ?

## 2019-06-15 NOTE — Telephone Encounter (Signed)
Has the shortness of breath become worse with medication. If not take 1/2 tablet keep a log and follow up in 2 weeks.

## 2019-07-07 ENCOUNTER — Telehealth (INDEPENDENT_AMBULATORY_CARE_PROVIDER_SITE_OTHER): Payer: Self-pay

## 2019-07-07 NOTE — Telephone Encounter (Signed)
Patient called wanting to know the status of he colonoscopy referral. States  PCP would referral her to a GI clinic and to the breast center to have her breast exam.   Please advice patient [phone]

## 2019-07-08 ENCOUNTER — Other Ambulatory Visit: Payer: Self-pay

## 2019-07-08 ENCOUNTER — Ambulatory Visit (INDEPENDENT_AMBULATORY_CARE_PROVIDER_SITE_OTHER): Payer: Self-pay | Admitting: Nurse Practitioner

## 2019-07-08 ENCOUNTER — Emergency Department (HOSPITAL_COMMUNITY)
Admission: EM | Admit: 2019-07-08 | Discharge: 2019-07-08 | Disposition: A | Discharge status: Home / Self Care | Payer: Self-pay | Attending: Emergency Medicine | Admitting: Emergency Medicine

## 2019-07-08 ENCOUNTER — Encounter: Payer: Self-pay | Admitting: Nurse Practitioner

## 2019-07-08 ENCOUNTER — Other Ambulatory Visit (INDEPENDENT_AMBULATORY_CARE_PROVIDER_SITE_OTHER): Payer: Self-pay

## 2019-07-08 VITALS — BP 152/90 | Temp 97.5°F | Ht 67.5 in | Wt 205.0 lb

## 2019-07-08 DIAGNOSIS — N189 Chronic kidney disease, unspecified: Secondary | ICD-10-CM | POA: Insufficient documentation

## 2019-07-08 DIAGNOSIS — Z91040 Latex allergy status: Secondary | ICD-10-CM | POA: Insufficient documentation

## 2019-07-08 DIAGNOSIS — R1010 Upper abdominal pain, unspecified: Secondary | ICD-10-CM

## 2019-07-08 DIAGNOSIS — R112 Nausea with vomiting, unspecified: Secondary | ICD-10-CM

## 2019-07-08 DIAGNOSIS — I129 Hypertensive chronic kidney disease with stage 1 through stage 4 chronic kidney disease, or unspecified chronic kidney disease: Secondary | ICD-10-CM | POA: Insufficient documentation

## 2019-07-08 DIAGNOSIS — F17210 Nicotine dependence, cigarettes, uncomplicated: Secondary | ICD-10-CM | POA: Insufficient documentation

## 2019-07-08 DIAGNOSIS — K852 Alcohol induced acute pancreatitis without necrosis or infection: Secondary | ICD-10-CM | POA: Insufficient documentation

## 2019-07-08 DIAGNOSIS — Z79899 Other long term (current) drug therapy: Secondary | ICD-10-CM | POA: Insufficient documentation

## 2019-07-08 DIAGNOSIS — F1010 Alcohol abuse, uncomplicated: Secondary | ICD-10-CM

## 2019-07-08 LAB — COMPREHENSIVE METABOLIC PANEL
ALT: 35 U/L (ref 0–35)
ALT: 36 U/L (ref 0–44)
AST: 36 U/L (ref 0–37)
AST: 37 U/L (ref 15–41)
Albumin: 3.9 g/dL (ref 3.5–5.2)
Albumin: 3.9 g/dL (ref 3.5–5.0)
Alkaline Phosphatase: 114 U/L (ref 39–117)
Alkaline Phosphatase: 107 U/L (ref 38–126)
Anion gap: 10 (ref 5–15)
BUN: 6 mg/dL (ref 6–23)
BUN: 6 mg/dL (ref 6–20)
CO2: 27 mEq/L (ref 19–32)
CO2: 26 mmol/L (ref 22–32)
Calcium: 8.6 mg/dL (ref 8.4–10.5)
Calcium: 8.6 mg/dL — ABNORMAL LOW (ref 8.9–10.3)
Chloride: 100 mEq/L (ref 96–112)
Chloride: 103 mmol/L (ref 98–111)
Creatinine, Ser: 0.69 mg/dL (ref 0.40–1.20)
Creatinine, Ser: 0.74 mg/dL (ref 0.44–1.00)
GFR calc Af Amer: >60 mL/min (ref >60)
GFR calc non Af Amer: >60 mL/min (ref >60)
GFR: 109.28 mL/min (ref >60.00)
Glucose, Bld: 101 mg/dL — ABNORMAL HIGH (ref 70–99)
Glucose, Bld: 99 mg/dL (ref 70–99)
Potassium: 3.8 mEq/L (ref 3.5–5.1)
Potassium: 3.5 mmol/L (ref 3.5–5.1)
Sodium: 137 mEq/L (ref 135–145)
Sodium: 139 mmol/L (ref 135–145)
Total Bilirubin: 0.9 mg/dL (ref 0.2–1.2)
Total Bilirubin: 0.8 mg/dL (ref 0.3–1.2)
Total Protein: 7.2 g/dL (ref 6.0–8.3)
Total Protein: 7.5 g/dL (ref 6.5–8.1)

## 2019-07-08 LAB — CBC WITH DIFFERENTIAL/PLATELET
Abs Immature Granulocytes: 0.02 10*3/uL (ref 0.00–0.07)
Basophils Absolute: 0 10*3/uL (ref 0.0–0.1)
Basophils Relative: 1 %
Eosinophils Absolute: 0.2 10*3/uL (ref 0.0–0.5)
Eosinophils Relative: 2 %
HCT: 36 % (ref 36.0–46.0)
Hemoglobin: 11.4 g/dL — ABNORMAL LOW (ref 12.0–15.0)
Immature Granulocytes: 0 %
Lymphocytes Relative: 21 %
Lymphs Abs: 1.8 10*3/uL (ref 0.7–4.0)
MCH: 26.6 pg (ref 26.0–34.0)
MCHC: 31.7 g/dL (ref 30.0–36.0)
MCV: 84.1 fL (ref 80.0–100.0)
Monocytes Absolute: 0.5 10*3/uL (ref 0.1–1.0)
Monocytes Relative: 6 %
Neutro Abs: 5.8 10*3/uL (ref 1.7–7.7)
Neutrophils Relative %: 70 %
Platelets: 333 10*3/uL (ref 150–400)
RBC: 4.28 MIL/uL (ref 3.87–5.11)
RDW: 19.8 % — ABNORMAL HIGH (ref 11.5–15.5)
WBC: 8.3 10*3/uL (ref 4.0–10.5)
nRBC: 0 % (ref 0.0–0.2)

## 2019-07-08 LAB — I-STAT BETA HCG BLOOD, ED (MC, WL, AP ONLY): I-stat hCG, quantitative: <5 m[IU]/mL (ref <5)

## 2019-07-08 LAB — CBC
HCT: 36.4 % (ref 36.0–46.0)
Hemoglobin: 11.7 g/dL — ABNORMAL LOW (ref 12.0–15.0)
MCHC: 32.1 g/dL (ref 30.0–36.0)
MCV: 83.6 fl (ref 78.0–100.0)
Platelets: 351 10*3/uL (ref 150.0–400.0)
RBC: 4.36 Mil/uL (ref 3.87–5.11)
RDW: 22.2 % — ABNORMAL HIGH (ref 11.5–15.5)
WBC: 8.7 10*3/uL (ref 4.0–10.5)

## 2019-07-08 LAB — URINALYSIS, ROUTINE W REFLEX MICROSCOPIC
Bilirubin Urine: NEGATIVE
Glucose, UA: NEGATIVE mg/dL
Hgb urine dipstick: NEGATIVE
Ketones, ur: 20 mg/dL — AB
Leukocytes,Ua: NEGATIVE
Nitrite: NEGATIVE
Protein, ur: NEGATIVE mg/dL
Specific Gravity, Urine: 1.016 (ref 1.005–1.030)
pH: 6 (ref 5.0–8.0)

## 2019-07-08 LAB — LIPASE, BLOOD: Lipase: 70 U/L — ABNORMAL HIGH (ref 11–51)

## 2019-07-08 LAB — LIPASE: Lipase: 71 U/L — ABNORMAL HIGH (ref 11.0–59.0)

## 2019-07-08 MED ORDER — PROMETHAZINE HCL 12.5 MG RE SUPP: 12.5000 mg | Freq: Three times a day (TID) | RECTAL | 0 refills | Status: DC | PRN

## 2019-07-08 MED ORDER — ONDANSETRON HCL 4 MG/2ML IJ SOLN: 4.0000 mg | Freq: Once | INTRAMUSCULAR | Status: DC

## 2019-07-08 MED ORDER — ONDANSETRON HCL 4 MG/2ML IJ SOLN
4.0000 mg | Freq: Once | INTRAMUSCULAR | Status: AC
  Administered 2019-07-08: 17:00:00 4 mg via INTRAVENOUS
  Filled 2019-07-08: qty 2

## 2019-07-08 MED ORDER — SODIUM CHLORIDE 0.9 % IV BOLUS
1000.0000 mL | Freq: Once | INTRAVENOUS | Status: AC
  Administered 2019-07-08: 1000 mL via INTRAVENOUS

## 2019-07-08 MED ORDER — ONDANSETRON HCL 4 MG/2ML IJ SOLN
4.0000 mg | Freq: Once | INTRAMUSCULAR | Status: AC
  Administered 2019-07-08: 15:00:00 4 mg via INTRAVENOUS
  Filled 2019-07-08: qty 2

## 2019-07-08 MED ORDER — DICYCLOMINE HCL 20 MG PO TABS
20.0000 mg | ORAL_TABLET | Freq: Two times a day (BID) | ORAL | 0 refills | Status: DC
Start: 2019-07-08 — End: 2019-08-25

## 2019-07-08 MED ORDER — MORPHINE SULFATE (PF) 4 MG/ML IV SOLN
4.0000 mg | Freq: Once | INTRAVENOUS | Status: AC
  Administered 2019-07-08: 15:00:00 4 mg via INTRAVENOUS
  Filled 2019-07-08: qty 1

## 2019-07-08 MED ORDER — FENTANYL CITRATE (PF) 100 MCG/2ML IJ SOLN
50.0000 ug | Freq: Once | INTRAMUSCULAR | Status: AC
  Administered 2019-07-08: 19:00:00 50 ug via INTRAVENOUS
  Filled 2019-07-08: qty 2

## 2019-07-08 MED ORDER — PROMETHAZINE HCL 25 MG/ML IJ SOLN
12.5000 mg | Freq: Once | INTRAMUSCULAR | Status: AC
  Administered 2019-07-08: 19:00:00 12.5 mg via INTRAVENOUS
  Filled 2019-07-08: qty 1

## 2019-07-08 MED ORDER — DICYCLOMINE HCL 20 MG PO TABS
20.0000 mg | ORAL_TABLET | Freq: Two times a day (BID) | ORAL | 0 refills | Status: DC
Start: 2019-07-08 — End: 2019-07-08

## 2019-07-08 MED ORDER — FENTANYL CITRATE (PF) 100 MCG/2ML IJ SOLN
100.0000 ug | Freq: Once | INTRAMUSCULAR | Status: AC
  Administered 2019-07-08: 17:00:00 100 ug via INTRAVENOUS
  Filled 2019-07-08: qty 2

## 2019-07-08 MED FILL — PROMETHAZINE 12.5 MG SUPPOS: 12.5 | 4 days supply | Qty: 12 | Fill #0

## 2019-07-08 NOTE — ED Notes (Signed)
Patient given PO food and fluids. 

## 2019-07-08 NOTE — Discharge Instructions (Signed)
As we discussed here, your work-up was reassuring.  You did have some slight inflammation of your pancreas, likely due to alcohol use.  As we discussed, you do clear liquids at home for diet.  You can take the previously prescribed Phenergan from [facility] for nausea/vomiting.  Take Bentyl for pain.  Follow-up with [facility].  Return to the Emergency Department immediately if you experience any worsening abdominal pain, fever, persistent nausea and vomiting, inability keep any food down, pain with urination, blood in your urine or any other worsening or concerning symptoms.

## 2019-07-08 NOTE — ED Provider Notes (Signed)
[facility] DEPT Provider Note   CSN: [id number] Arrival date & time: 07/08/19  1347     History Chief Complaint  Patient presents with  . Abdominal Pain    [name] is a 49 y.o. female past medical history of alcoholism, bipolar, chronic kidney disease, GERD who presents for evaluation of abdominal pain x3 days.  She reports that it is mostly in the epigastric and left upper quadrant region and describes it as cramping "almost like contractions" type pain.  She states it has been constant over the last 3 days.  She has had some nausea/vomiting.  Emesis is nonbloody, nonbilious.  She states she has had this pain before when she has had episodes of pancreatitis.  She does endorse drinking alcohol and states that she drank about half of 1/5 of [place] vodka prior to onset of symptoms.  She was seen at [facility] today for evaluation of symptoms and was discharged home.  She was told to go to the emergency department if her symptoms worsen which is what brought her in today.  Her last bowel movement was this morning and was normal.  Patient states states she has not had any fevers, dysuria, hematuria, chest pain, difficulty breathing.  The history is provided by the patient.       Past Medical History:  Diagnosis Date  . Alcoholism ([place])   . Anxiety   . Arthritis   . Asthma   . Blood transfusion    1989 at [facility]  . Bronchitis   . Chronic bipolar disorder ([place])   . Chronic kidney disease   . Depression    bipolar  . GERD (gastroesophageal reflux disease)   . Headache(784.0)    occasional  . Hypertension   . Mental disorder    bipolar  . MRSA infection 2011   left lower leg  . Pancreatitis   . Pyelonephritis 10/2010  . Recurrent upper respiratory infection (URI)    states she has not been to MD and feels like she has  bronchitis now- greenish sputum  . Shortness of breath    sometimes with exertion  . Sickle cell anemia (HCC)     sickle cell trait   . Sickle cell trait ([place])   . Sleep apnea    CPAP  . Substance abuse ([place])    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  . UTI (lower urinary tract infection) 10/2010    Patient Active Problem List   Diagnosis Date Noted  . Essential hypertension, benign 08/28/2018  . Seasonal allergies 08/28/2018  . Herpes simplex infection 04/07/2018  . Other tear of medial meniscus, current injury, left knee, subsequent encounter 03/27/2018  . Fracture of tibial plateau, closed 03/08/2011  . GERD (gastroesophageal reflux disease) 03/08/2011  . Bipolar disorder ([place]) 03/08/2011  . OSA (obstructive sleep apnea) 03/08/2011  . Obesity 03/08/2011  . Nicotine dependence 03/08/2011    Past Surgical History:  Procedure Laterality Date  . CESAREAN SECTION    . KNEE ARTHROSCOPY  06/15/2011   Procedure: ARTHROSCOPY KNEE;  Surgeon: Rozanna Box, MD;  Location: [facility];  Service: Orthopedics;  Laterality: Left;  LEFT KNEE SCOPE WITH LYSIS OF ADHESIONS AND MANIPULATION  . KNEE ARTHROSCOPY WITH MEDIAL MENISECTOMY Left 03/27/2018   Procedure: LEFT KNEE ARTHROSCOPY WITH PARTIAL MEDIAL MENISCECTOMY;  Surgeon: Mcarthur Rossetti, MD;  Location: [facility];  Service: Orthopedics;  Laterality: Left;  . ORIF TIBIA PLATEAU  03/08/2011   Procedure: OPEN REDUCTION  INTERNAL FIXATION (ORIF) TIBIAL PLATEAU;  Surgeon: Rozanna Box;  Location: [facility];  Service: Orthopedics;  Laterality: Left;  . TUBAL LIGATION       OB History    Gravida  8   Para  4   Term      Preterm      AB  4   Living  4     SAB  2   TAB  2   Ectopic      Multiple      Live Births              Family History  Problem Relation Age of Onset  . Thyroid disease Father   . Colon cancer Father   . Cystic fibrosis Sister   . Heart disease Brother   . Esophageal cancer Paternal Grandmother   . Anesthesia problems Neg Hx   . Breast cancer Neg Hx     Social History   Tobacco Use  . Smoking status:  Current Every Day Smoker    Packs/day: 0.50    Years: 24.00    Pack years: 12.00    Types: Cigarettes  . Smokeless tobacco: Never Used  Substance Use Topics  . Alcohol use: Yes    Comment: Rare  . Drug use: Not Currently    Types: "Crack" cocaine    Comment: none since 2000    Home Medications Prior to Admission medications   Medication Sig Start Date End Date Taking? Authorizing Provider  amLODipine (NORVASC) 10 MG tablet Take 1 tablet daily 06/10/19  Yes Kerin Perna, NP  chlorthalidone (HYGROTON) 50 MG tablet Take 1 tablet (50 mg total) by mouth daily. 06/10/19  Yes Kerin Perna, NP  ibuprofen (ADVIL) 200 MG tablet Take 200 mg by mouth every 6 (six) hours as needed for moderate pain.   Yes [provider]  dicyclomine (BENTYL) 20 MG tablet Take 1 tablet (20 mg total) by mouth 2 (two) times daily for 6 days. 07/08/19 07/14/19  Volanda Napoleon, PA-C  hydrochlorothiazide (HYDRODIURIL) 25 MG tablet Take 1 tablet (25 mg total) by mouth daily. Patient not taking: Reported on 07/08/2019 08/27/18   Kerin Perna, NP  promethazine (PHENERGAN) 12.5 MG suppository Place 1 suppository (12.5 mg total) rectally every 8 (eight) hours as needed for nausea or vomiting. Patient not taking: Reported on 07/08/2019 07/08/19   Willia Craze, NP    Allergies    Aspirin, Banana, Latex, Penicillins, Septra [bactrim], Shellfish allergy, Strawberry extract, and Tape  Review of Systems   Review of Systems  Constitutional: Negative for fever.  Respiratory: Negative for cough and shortness of breath.   Cardiovascular: Negative for chest pain.  Gastrointestinal: Positive for abdominal pain, nausea and vomiting.  Genitourinary: Negative for dysuria and hematuria.  Neurological: Negative for headaches.  All other systems reviewed and are negative.   Physical Exam Updated Vital Signs BP (!) 150/83   Pulse 83   Temp 98.9 F (37.2 C) (Oral)   Resp 16   Ht 5' 7.5" (1.715 m)   Wt  93 kg   SpO2 100%   BMI 31.63 kg/m   Physical Exam Vitals and nursing note reviewed.  Constitutional:      Appearance: Normal appearance. She is well-developed.     Comments: Appears uncomfortable but NAD  HENT:     Head: Normocephalic and atraumatic.  Eyes:     General: Lids are normal.     Conjunctiva/sclera: Conjunctivae normal.  Pupils: Pupils are equal, round, and reactive to light.  Cardiovascular:     Rate and Rhythm: Normal rate and regular rhythm.     Pulses: Normal pulses.     Heart sounds: Normal heart sounds. No murmur. No friction rub. No gallop.   Pulmonary:     Effort: Pulmonary effort is normal.     Breath sounds: Normal breath sounds.     Comments: Lungs clear to auscultation bilaterally.  Symmetric chest rise.  No wheezing, rales, rhonchi. Abdominal:     Palpations: Abdomen is soft. Abdomen is not rigid.     Tenderness: There is abdominal tenderness in the epigastric area and left upper quadrant. There is no guarding.     Comments: Abdomen soft, nondistended.  Tenderness palpation of the epigastric and left upper quadrant area.  No rigidity, guarding.  No CVA tenderness noted bilaterally.  Musculoskeletal:        General: Normal range of motion.     Cervical back: Full passive range of motion without pain.  Skin:    General: Skin is warm and dry.     Capillary Refill: Capillary refill takes less than 2 seconds.  Neurological:     Mental Status: She is alert and oriented to person, place, and time.  Psychiatric:        Speech: Speech normal.     ED Results / Procedures / Treatments   Labs (all labs ordered are listed, but only abnormal results are displayed) Labs Reviewed  COMPREHENSIVE METABOLIC PANEL - Abnormal; Notable for the following components:      Result Value   Calcium 8.6 (*)    All other components within normal limits  LIPASE, BLOOD - Abnormal; Notable for the following components:   Lipase 70 (*)    All other components within  normal limits  CBC WITH DIFFERENTIAL/PLATELET - Abnormal; Notable for the following components:   Hemoglobin 11.4 (*)    RDW 19.8 (*)    All other components within normal limits  URINALYSIS, ROUTINE W REFLEX MICROSCOPIC - Abnormal; Notable for the following components:   APPearance HAZY (*)    Ketones, ur 20 (*)    All other components within normal limits  I-STAT BETA HCG BLOOD, ED ([facility], [facility], AP ONLY)    EKG None  Radiology No results found.  Procedures Procedures (including critical care time)  Medications Ordered in ED Medications  sodium chloride 0.9 % bolus 1,000 mL (0 mLs Intravenous Stopped 07/08/19 1648)  ondansetron (ZOFRAN) injection 4 mg (4 mg Intravenous Given 07/08/19 1500)  morphine 4 MG/ML injection 4 mg (4 mg Intravenous Given 07/08/19 1515)  fentaNYL (SUBLIMAZE) injection 100 mcg (100 mcg Intravenous Given 07/08/19 1645)  ondansetron (ZOFRAN) injection 4 mg (4 mg Intravenous Given 07/08/19 1645)  fentaNYL (SUBLIMAZE) injection 50 mcg (50 mcg Intravenous Given 07/08/19 1855)  promethazine (PHENERGAN) injection 12.5 mg (12.5 mg Intravenous Given 07/08/19 1854)    ED Course  I have reviewed the triage vital signs and the nursing notes.  Pertinent labs & imaging results that were available during my care of the patient were reviewed by me and considered in my medical decision making (see chart for details).  Clinical Course as of Jul 08 2022  Wed Jul 08, 8182  6552 49 year old female with history of pancreatitis here with abdominal pain nausea vomiting after drinking alcohol.  She is mildly uncomfortable looking on the room vitals are unremarkable other than hypertension.  Getting labs fluids pain medicine.  Disposition per results  of her testing and her symptom improvement.   [MB]    Clinical Course User Index [MB] Hayden Rasmussen, MD   MDM Rules/Calculators/A&P                      48 year old female past history of alcoholism, GERD who presents for evaluation of  abdominal pain x3 days.  Associate with nausea/vomiting.  History of alcohol induced pancreatitis.  Does endorse drinking alcohol prior to onset of symptoms.  Seen previously at [facility]. Patient is afebrile, non-toxic appearing, sitting comfortably on examination table. Vital signs reviewed and stable.  Consider pancreatitis versus infectious etiology versus hepatobiliary oncology.  Plan to check labs.  UA negative for infectious theology.  I-STAT beta negative.  Lipase is slightly elevated at 70.  CMP shows no evidence of elevation in her LFTs.  CBC shows no leukocytosis.  Reevaluation.  Patient still uncomfortable.  Will give additional analgesics.  Reevaluation.  Patient is resting comfortably.  Repeat abdominal exam shows improved tenderness.  Patient reports feeling somewhat better.  She has not had any more vomiting after additional analgesics and antiemetics.  At this time, I suspect that she has slight pancreatitis secondary to alcohol use.  History/physical exam not concerning for cholecystitis, appendicitis, diverticulitis.  At this time, no indication for CT is do not suspect surgical abdomen.  She had been seen by GI earlier this morning and first been prescribed Phenergan.  We will plan to give her a short course of Bentyl to help with pain.  I encourage patient with clear fluids at home. At this time, patient exhibits no emergent life-threatening condition that require further evaluation in ED or admission. Patient had ample opportunity for questions and discussion. All patient's questions were answered with full understanding. Strict return precautions discussed. Patient expresses understanding and agreement to plan.   Portions of this note were generated with [employer]. Dictation errors may occur despite best attempts at proofreading.  Final Clinical Impression(s) / ED Diagnoses Final diagnoses:  Pain of upper abdomen  Alcohol-induced acute pancreatitis, unspecified  complication status    Rx / DC Orders ED Discharge Orders         Ordered    dicyclomine (BENTYL) 20 MG tablet  2 times daily,   Status:  Discontinued     07/08/19 2006    dicyclomine (BENTYL) 20 MG tablet  2 times daily     07/08/19 2007           Desma Mcgregor 07/08/19 2025    Hayden Rasmussen, MD 07/09/19 [phone]

## 2019-07-08 NOTE — Patient Instructions (Signed)
If you are age 49 or older, your body mass index should be between 23-30. Your Body mass index is 31.63 kg/m. If this is out of the aforementioned range listed, please consider follow up with your Primary Care Provider.  If you are age 6 or younger, your body mass index should be between 19-25. Your Body mass index is 31.63 kg/m. If this is out of the aformentioned range listed, please consider follow up with your Primary Care Provider.   Your provider has requested that you go to the basement level for lab work before leaving today. Press "B" on the elevator. The lab is located at the first door on the left as you exit the elevator.  We have sent the following medications to your pharmacy for you to pick up at your convenience: Phenergan Suppositories   STOP ALCOHOL. If any shaking go to emergency department.  Drink 4-6 bottles of water daily.  Clear Liquid diet until notified.  Thank you for choosing me and [facility].   Tye Savoy, NP

## 2019-07-08 NOTE — ED Triage Notes (Signed)
Arrives via EMS via from home. C/C abd pain with emesis and nausea. Hx of pancreatitis, patient reports drinking 1/2 of a 5th of [place] vodka 3 days ago and then the pain started.   BP with EMS 158/90 P 93 99% RA CBG 109 T 97.3

## 2019-07-08 NOTE — Progress Notes (Signed)
Reviewed and agree with management plans. ? ?Garnett Rekowski L. Kenna Kirn, MD, MPH  ?

## 2019-07-08 NOTE — Progress Notes (Signed)
IMPRESSION and PLAN:    49 year old female with PMH significant for chronic abdominal pain, history of EtOH pancreatitis, CKD, bipolar disorder , sleep apnea, GERD, diverticulosis, uterine fibroids  # Acute on chronic upper abdominal pain --History of acute Etoh pancreatitis October 2019 --Feels like when she had pancreatitis. Of note she was seen in ED in March 2020 and November 2020 with same symptoms but NO biochemical or radiographic evidence for pancreatitis those times.  --Interestingly patient says that she she was evaluated at [facility] on [facility].  two weeks ago but there is no record of that visit / labs in [place].  --She is still consuming Etoh - 1/5 of liquor a day so still need to evaluate for pancreatitis.  She is not holding much in the way of fluids.  Sometimes vomiting up antiemetic --Trial of Phenergan Vanstory 12.5 mg every 8 hours needed for nausea and vomiting --Push fluids --CBC, c-Met, lipase now --I will call her with results  # Etoh abuse --With her detox program a few months ago, resume drinking a month or so later --Consuming 1/5 of liquor a day.  She wants to stop drinking but is in a stressful living situation.  She does not attend AA --Reiterated the need to discontinue alcohol.  Explained that it can be damaging to her pancreas and liver and possibly even responsible for her abdominal pain --Advised her to stop drinking.  If she develops any withdrawal symptoms such as tremors then needs to go to the ED  # Chronic normocytic anemia --history of fibroids, abnormal uterine bleeding --Hemoglobin stable at 11.2 on 06/10/2019 labs.   #Colon cancer screening --This should be scheduled in the future when acute issues resolve   HPI:    Primary GI: Dr. Tarri Glenn  Chief complaint :  Pancreatitis flare  Patient is a 49 year old female with history of chronic abdominal pain.  She saw Dr. Tarri Glenn November 2019 for worsening abdominal pain and hematemesis.   She also complained of solid food dysphagia localized to the cervical esophagus  Patient was scheduled for EGD which showed a small hiatal hernia, gastritis and duodenopathy.    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  Small bowel biopsies unremarkable .  She was advised to discontinue alcohol which was felt to be contributing to symptoms.   HISTORY SINCE LAST VISIT HERE  05/07/2018 -  ED for upper abdominal pain. Labs and CT scan unremarkable  10/06/2018 - ED for upper abdominal pain. Labs and CT scan unremarkable.   01/31/19 - ED for upper abdominal pain radiating through to her back with associated nausea and vomiting. Marland Kitchen LFTs and lipase were normal. She has chronic anemia, hemoglobin was stable. WBC was normal.  Urinalysis not suspicious for UTI . CT AP with contrast showed hepatic steatosis,  leiomyomatous uterus.  No acute GI findings.  Appendix absent.   Patient says she began having recurrent upper abdominal pain radiating through to her back about 1.5 weeks ago.  Symptoms associated with nausea and vomiting.  Pain constant, it is worse if she takes a deep breath.  Pain has gotten progressively worse as the days go on.  She is having difficulty holding down fluids.  Sounds like she has been taking Zofran but occasionally vomiting up the medication.  No documented fevers but feels hot and then cold.  Patient has been drinking for the liquor a day.  She wants to stop drinking.  Went to detox a  few months ago but started drinking about a month later.  She reports being under a lot of stress with her living situation.  [name] says she [facility] on [address] 2 weeks ago.  She says that she was evaluated in the ED and sent home.  However, there are no records of that visit/labs in epic.  There are labs in epic from 06/10/2019 but that was from PCP annual exam.  On that day her alk phos was slightly elevated at 140, AST elevated at 65, ALT 30. Lipase not checked, she wasn't having abdominal pain at that  time.   Patient says she just needs to make the pain stop.  She wants a medication to prevent flaring of pancreatitis.  We discussed that she may not have pancreatitis but if she did then alcohol is most certainly the culprit.    Data Reviewed:  Previous Endoscopic Evaluations:  EGD 01/20/18 Normal esophagus except for a widely patent Schatzki's ring. - Gastritis. Biopsied. - Small hiatal hernia. - Erythematous duodenopathy. Biopsied. - The examination was otherwise normal  Gastric biopsies -chronic inactive gastritis.  No H. Pylori Small bowel biopsies unremarkable  Review of systems:     No chest pain, no SOB, no fevers, no urinary sx   Past Medical History:  Diagnosis Date  . Alcoholism ([place])   . Anxiety   . Arthritis   . Asthma   . Blood transfusion    1989 at [facility]  . Bronchitis   . Chronic bipolar disorder ([place])   . Chronic kidney disease   . Depression    bipolar  . GERD (gastroesophageal reflux disease)   . Headache(784.0)    occasional  . Hypertension   . Mental disorder    bipolar  . MRSA infection 2011   left lower leg  . Pancreatitis   . Pyelonephritis 10/2010  . Recurrent upper respiratory infection (URI)    states she has not been to MD and feels like she has  bronchitis now- greenish sputum  . Shortness of breath    sometimes with exertion  . Sickle cell anemia (HCC)    sickle cell trait   . Sickle cell trait ([place])   . Sleep apnea    CPAP  . Substance abuse ([place])    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  . UTI (lower urinary tract infection) 10/2010    Patient's surgical history, family medical history, social history, medications and allergies were all reviewed in Epic   Creatinine clearance cannot be calculated (Patient's most recent lab result is older than the maximum 21 days allowed.)  Current Outpatient Medications  Medication Sig Dispense Refill  . albuterol (VENTOLIN HFA) 108 (90 Base) MCG/ACT inhaler Inhale 2 puffs into the lungs every 6 (six)  hours as needed for wheezing. (Patient not taking: Reported on 06/10/2019) 18 g 0  . amLODipine (NORVASC) 10 MG tablet Take 1 tablet daily 90 tablet 1  . atorvastatin (LIPITOR) 40 MG tablet Take 1 tablet (40 mg total) by mouth daily. (Patient not taking: Reported on 06/10/2019) 90 tablet 3  . cetirizine (ZYRTEC) 10 MG tablet Take 1 tablet (10 mg total) by mouth daily. (Patient not taking: Reported on 06/10/2019) 30 tablet 11  . chlorthalidone (HYGROTON) 50 MG tablet Take 1 tablet (50 mg total) by mouth daily. 90 tablet 1  . fluticasone (FLONASE) 50 MCG/ACT nasal spray Place 2 sprays into both nostrils daily. (Patient not taking: Reported on 06/10/2019) 16 g 6  . gabapentin (NEURONTIN)  100 MG capsule Take 100 mg by mouth 4 (four) times daily.    . hydrochlorothiazide (HYDRODIURIL) 25 MG tablet Take 1 tablet (25 mg total) by mouth daily. (Patient not taking: Reported on 06/10/2019) 90 tablet 0  . metroNIDAZOLE (FLAGYL) 500 MG tablet Take 1 tablet (500 mg total) by mouth 2 (two) times daily. 14 tablet 0  . pantoprazole (PROTONIX) 20 MG tablet Take 1 tablet (20 mg total) by mouth daily. 30 tablet 0   No current facility-administered medications for this visit.    Physical Exam:     BP (!) 152/90 (BP Location: Left Arm, Patient Position: Sitting, Cuff Size: Normal)   Temp (!) 97.5 F (36.4 C) (Other (Comment))   Ht 5' 7.5" (1.715 m)   Wt 205 lb (93 kg)   BMI 31.63 kg/m   GENERAL:  Female in NAD PSYCH: : Cooperative, normal affect CARDIAC:  RRR,  PULM: Normal respiratory effort, lungs CTA bilaterally, no wheezing ABDOMEN:  Nondistended, soft, mild diffuse tenderness.  No obvious masses, no hepatomegaly,  normal bowel sounds SKIN:  turgor, no lesions seen Musculoskeletal:  Normal muscle tone, normal strength NEURO: Alert and oriented x 3, no focal neurologic deficits   Tye Savoy , NP 07/08/2019, 8:39 AM

## 2019-07-08 NOTE — Telephone Encounter (Signed)
Patient was seen by GI today for upper abdominal pain and pancreatitis. Advised she ask them if she can schedule a colonoscopy or would another referral be required. Patient states PCP was also supposed to refer to dermatology. No supporting information in last office note. Please refer if appropriate. Patient advised to contact breast center to check on status of mammogram through [facility].

## 2019-07-09 ENCOUNTER — Encounter (HOSPITAL_COMMUNITY): Payer: Self-pay

## 2019-07-09 ENCOUNTER — Other Ambulatory Visit: Payer: Self-pay

## 2019-07-09 ENCOUNTER — Telehealth: Payer: Self-pay | Admitting: Gastroenterology

## 2019-07-09 ENCOUNTER — Telehealth: Payer: Self-pay

## 2019-07-09 ENCOUNTER — Emergency Department (HOSPITAL_COMMUNITY)
Admission: EM | Admit: 2019-07-09 | Discharge: 2019-07-09 | Disposition: A | Discharge status: Home / Self Care | Payer: Self-pay | Attending: Emergency Medicine | Admitting: Emergency Medicine

## 2019-07-09 DIAGNOSIS — R1011 Right upper quadrant pain: Secondary | ICD-10-CM | POA: Insufficient documentation

## 2019-07-09 DIAGNOSIS — Z5321 Procedure and treatment not carried out due to patient leaving prior to being seen by health care provider: Secondary | ICD-10-CM | POA: Insufficient documentation

## 2019-07-09 LAB — COMPREHENSIVE METABOLIC PANEL
ALT: 28 U/L (ref 0–44)
AST: 34 U/L (ref 15–41)
Albumin: 3.7 g/dL (ref 3.5–5.0)
Alkaline Phosphatase: 95 U/L (ref 38–126)
Anion gap: 9 (ref 5–15)
BUN: 7 mg/dL (ref 6–20)
CO2: 26 mmol/L (ref 22–32)
Calcium: 9.2 mg/dL (ref 8.9–10.3)
Chloride: 106 mmol/L (ref 98–111)
Creatinine, Ser: 0.8 mg/dL (ref 0.44–1.00)
GFR calc Af Amer: >60 mL/min (ref >60)
GFR calc non Af Amer: >60 mL/min (ref >60)
Glucose, Bld: 111 mg/dL — ABNORMAL HIGH (ref 70–99)
Potassium: 4 mmol/L (ref 3.5–5.1)
Sodium: 141 mmol/L (ref 135–145)
Total Bilirubin: 0.4 mg/dL (ref 0.3–1.2)
Total Protein: 7.2 g/dL (ref 6.5–8.1)

## 2019-07-09 LAB — URINALYSIS, ROUTINE W REFLEX MICROSCOPIC
Glucose, UA: NEGATIVE mg/dL
Hgb urine dipstick: NEGATIVE
Ketones, ur: 5 mg/dL — AB
Leukocytes,Ua: NEGATIVE
Nitrite: NEGATIVE
Protein, ur: 30 mg/dL — AB
Specific Gravity, Urine: 1.024 (ref 1.005–1.030)
pH: 6 (ref 5.0–8.0)

## 2019-07-09 LAB — CBC
HCT: 34.8 % — ABNORMAL LOW (ref 36.0–46.0)
Hemoglobin: 10.9 g/dL — ABNORMAL LOW (ref 12.0–15.0)
MCH: 26.7 pg (ref 26.0–34.0)
MCHC: 31.3 g/dL (ref 30.0–36.0)
MCV: 85.3 fL (ref 80.0–100.0)
Platelets: 325 10*3/uL (ref 150–400)
RBC: 4.08 MIL/uL (ref 3.87–5.11)
RDW: 20.3 % — ABNORMAL HIGH (ref 11.5–15.5)
WBC: 8.6 10*3/uL (ref 4.0–10.5)
nRBC: 0.2 % (ref 0.0–0.2)

## 2019-07-09 LAB — LIPASE, BLOOD: Lipase: 48 U/L (ref 11–51)

## 2019-07-09 LAB — I-STAT BETA HCG BLOOD, ED (MC, WL, AP ONLY): I-stat hCG, quantitative: <5 m[IU]/mL (ref <5)

## 2019-07-09 MED FILL — metroNIDAZOLE 500 MG TABS: 500 | 7 days supply | Qty: 14 | Fill #0

## 2019-07-09 NOTE — Telephone Encounter (Signed)
-----   Message from Willia Craze, NP sent at 07/09/2019  4:44 PM EDT ----- Eustaquio Maize, would you please call and check on her.  Her lipase is mildly elevated, possibly does represent pancreatitis.  She has to stop drinking alcohol.  See how the Phenergan suppositories are working for her, make sure she is on clear liquids until feeling better.  If feels really bad and/or unable to tolerate fluids then needs to go to the ED.  Thanks

## 2019-07-09 NOTE — ED Triage Notes (Signed)
RUQ abdominal pain x 4 days. Dx pancreatitis yesterday here. Went to PCP and told to come back due to pain.

## 2019-07-09 NOTE — Telephone Encounter (Signed)
Spoke with this very nice patient. She is advised of her results. She states the phenergan is helping some. She is keeping down clear liquids. Encouraged to continue clear liquids such as broth, jello, popcicles and juices until feeling better. Instructed to call us if she worsens acutely or fails to improve.

## 2019-07-09 NOTE — ED Notes (Signed)
Pt called 3X for room placement. Not located. Eloped from waiting area.

## 2019-07-09 NOTE — Telephone Encounter (Signed)
Patient paged on call to inform that her pain and nausea have worsened through the day today and that she was heading to the ER. Was seen in the GI clinic yesterday, then in the ER last night. Aside from lipase 70, essentially normal work up and was d/c-ed to home. No imaging for review from yesterday.   States she hasn't tolerated any PO all day and is heading to ER for eval. Will forward on to her primary GI team to f/u on her tomorrow. Appreciative of call back.

## 2019-07-10 ENCOUNTER — Encounter (HOSPITAL_COMMUNITY): Payer: Self-pay

## 2019-07-10 ENCOUNTER — Other Ambulatory Visit: Payer: Self-pay

## 2019-07-10 ENCOUNTER — Emergency Department (HOSPITAL_COMMUNITY)
Admission: EM | Admit: 2019-07-10 | Discharge: 2019-07-10 | Disposition: A | Discharge status: Home / Self Care | Payer: Self-pay | Attending: Emergency Medicine | Admitting: Emergency Medicine

## 2019-07-10 ENCOUNTER — Telehealth: Payer: Self-pay | Admitting: Nurse Practitioner

## 2019-07-10 DIAGNOSIS — R1011 Right upper quadrant pain: Secondary | ICD-10-CM | POA: Insufficient documentation

## 2019-07-10 DIAGNOSIS — Z5321 Procedure and treatment not carried out due to patient leaving prior to being seen by health care provider: Secondary | ICD-10-CM | POA: Insufficient documentation

## 2019-07-10 NOTE — ED Triage Notes (Addendum)
RUQ abdominal pain x 4 days. Dx pancreatitis yesterday here. Went to PCP and told to come back due to pain. Blood drawn a couple hours ago on last visit.

## 2019-07-10 NOTE — Telephone Encounter (Signed)
Spoke with the patient. She states she did stay in the ED for lengthy period of time last night then again this morning. Labs were drawn. She left without being seen. She was uncomfortable waiting in the waiting room, more comfortable at home. She is at home now. She is still on clear liquids, no solids. Vomited a little this morning, "not a lot." Last took promethazine last night. She has "cold sweats" feels "shakey." Abdomen still tender.

## 2019-07-11 ENCOUNTER — Ambulatory Visit: Payer: Self-pay

## 2019-07-13 ENCOUNTER — Ambulatory Visit (INDEPENDENT_AMBULATORY_CARE_PROVIDER_SITE_OTHER): Payer: Self-pay | Admitting: Primary Care

## 2019-07-14 NOTE — Telephone Encounter (Signed)
Beth,  Please get this patient a follow-up with Dr. Loletha Carrow.  She is having persistent pain, ED visits are negative, lipase borderline.  Thanks

## 2019-07-15 NOTE — Telephone Encounter (Signed)
I will do that. His first opening for an office visit is late June.  Please advise on the scheduling.

## 2019-07-15 NOTE — Telephone Encounter (Signed)
If that is his first available appointment then that is all we can do.  She has been to the emergency department and I have seen her in clinic.  At this point I do not have anything else to offer her, we will see what suggestions Dr. Loletha Carrow has when he sees her in clinic. Thanks

## 2019-07-20 ENCOUNTER — Other Ambulatory Visit: Payer: Self-pay

## 2019-07-20 NOTE — Telephone Encounter (Signed)
Sent to PCP ?

## 2019-07-20 NOTE — Telephone Encounter (Signed)
Appointment scheduled for the patient.  Letter mailed.

## 2019-07-21 NOTE — Telephone Encounter (Signed)
Tired calling patient we discussed so much during her appointment I do not recall any problems with her skin. Please try to contact her so I can refer if appropiate

## 2019-07-23 ENCOUNTER — Ambulatory Visit (INDEPENDENT_AMBULATORY_CARE_PROVIDER_SITE_OTHER): Payer: Self-pay | Admitting: Primary Care

## 2019-07-23 NOTE — Telephone Encounter (Signed)
Unable to reach and patient and she did not show for her appointment today.

## 2019-08-25 ENCOUNTER — Encounter: Payer: Self-pay | Admitting: Gastroenterology

## 2019-08-25 ENCOUNTER — Ambulatory Visit (INDEPENDENT_AMBULATORY_CARE_PROVIDER_SITE_OTHER): Payer: Self-pay | Admitting: Gastroenterology

## 2019-08-25 ENCOUNTER — Other Ambulatory Visit: Payer: Self-pay

## 2019-08-25 VITALS — BP 140/76 | HR 72 | Ht 67.0 in | Wt 199.0 lb

## 2019-08-25 DIAGNOSIS — Z01818 Encounter for other preprocedural examination: Secondary | ICD-10-CM

## 2019-08-25 DIAGNOSIS — R112 Nausea with vomiting, unspecified: Secondary | ICD-10-CM

## 2019-08-25 DIAGNOSIS — R109 Unspecified abdominal pain: Secondary | ICD-10-CM

## 2019-08-25 DIAGNOSIS — K625 Hemorrhage of anus and rectum: Secondary | ICD-10-CM

## 2019-08-25 DIAGNOSIS — K219 Gastro-esophageal reflux disease without esophagitis: Secondary | ICD-10-CM

## 2019-08-25 DIAGNOSIS — R195 Other fecal abnormalities: Secondary | ICD-10-CM

## 2019-08-25 MED ORDER — FAMOTIDINE 20 MG PO TABS
20.0000 mg | ORAL_TABLET | Freq: Two times a day (BID) | ORAL | 3 refills | Status: DC
Start: 2019-08-25 — End: 2020-11-22

## 2019-08-25 MED ORDER — PLENVU 140 G PO SOLR
1.0000 | Freq: Once | ORAL | 0 refills | Status: AC
Start: 2019-08-25 — End: 2019-08-25

## 2019-08-25 MED FILL — FAMOTIDINE 20 MG TABS: 20 | 30 days supply | Qty: 60 | Fill #0
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
## 2019-08-25 NOTE — Progress Notes (Deleted)
[facility] Progress Note  Chief Complaint: (Primary patient of Dr. Thornton Park, last seen in clinic by APP on 07/08/19)  Nausea and vomiting  Subjective  History: From most recent visit 07/08/19: "50 year old female with PMH significant for chronic abdominal pain, history of EtOH pancreatitis, CKD, bipolar disorder , sleep apnea, GERD, diverticulosis, uterine fibroids   # Acute on chronic upper abdominal pain --History of acute Etoh pancreatitis October 2019 --Feels like when she had pancreatitis. Of note she was seen in ED in March 2020 and November 2020 with same symptoms but NO biochemical or radiographic evidence for pancreatitis those times.  --Interestingly patient says that she she was evaluated at [facility] ED on [facility].  two weeks ago but there is no record of that visit / labs in [place].  --She is still consuming Etoh - 1/5 of liquor a day so still need to evaluate for pancreatitis.  She is not holding much in the way of fluids.  Sometimes vomiting up antiemetic --Trial of Phenergan Vanstory 12.5 mg every 8 hours needed for nausea and vomiting --Push fluids --CBC, c-Met, lipase now --I will call her with results   # Etoh abuse --With her detox program a few months ago, resume drinking a month or so later --Consuming 1/5 of liquor a day.  She wants to stop drinking but is in a stressful living situation.  She does not attend AA --Reiterated the need to discontinue alcohol.  Explained that it can be damaging to her pancreas and liver and possibly even responsible for her abdominal pain --Advised her to stop drinking.  If she develops any withdrawal symptoms such as tremors then needs to go to the ED   # Chronic normocytic anemia --history of fibroids, abnormal uterine bleeding --Hemoglobin stable at 11.2 on 06/10/2019 labs.    #Colon cancer screening --This should be scheduled in the future when acute issues resolve     HPI:     Primary GI: Dr.  Tarri Glenn" ____________________________________________  Martin Majestic to ED 07/08/19, then again 5/6 and 5/7 but left w/o being seen. _____________________________________ ***  ROS: Cardiovascular:  no chest pain Respiratory: no dyspnea  The patient's Past Medical, Family and Social History were reviewed and are on file in the EMR.  Objective:  Med list reviewed  Current Outpatient Medications:  .  amLODipine (NORVASC) 10 MG tablet, Take 1 tablet daily, Disp: 90 tablet, Rfl: 1 .  chlorthalidone (HYGROTON) 50 MG tablet, Take 1 tablet (50 mg total) by mouth daily., Disp: 90 tablet, Rfl: 1 .  dicyclomine (BENTYL) 20 MG tablet, Take 1 tablet (20 mg total) by mouth 2 (two) times daily for 6 days., Disp: 12 tablet, Rfl: 0 .  hydrochlorothiazide (HYDRODIURIL) 25 MG tablet, Take 1 tablet (25 mg total) by mouth daily. (Patient not taking: Reported on 07/08/2019), Disp: 90 tablet, Rfl: 0 .  ibuprofen (ADVIL) 200 MG tablet, Take 200 mg by mouth every 6 (six) hours as needed for moderate pain., Disp: , Rfl:  .  promethazine (PHENERGAN) 12.5 MG suppository, Place 1 suppository (12.5 mg total) rectally every 8 (eight) hours as needed for nausea or vomiting. (Patient not taking: Reported on 07/08/2019), Disp: 12 each, Rfl: 0   Vital signs in last 24 hrs: There were no vitals filed for this visit.  Physical Exam  ***  HEENT: sclera anicteric, oral mucosa moist without lesions  Neck: supple, no thyromegaly, JVD or lymphadenopathy  Cardiac: RRR without murmurs, S1S2 heard, no peripheral edema  Pulm: clear to auscultation bilaterally, normal RR and effort noted  Abdomen: soft, *** tenderness, with active bowel sounds. No guarding or palpable hepatosplenomegaly.  Skin; warm and dry, no jaundice or rash  Labs:   ___________________________________________ Radiologic studies:   ____________________________________________ Other:   _____________________________________________ Assessment & Plan   Assessment: No diagnosis found.    Plan:    *** minutes were spent on this encounter (including chart review, history/exam, counseling/coordination of care, and documentation)  Nelida Meuse III

## 2019-08-25 NOTE — Patient Instructions (Addendum)
Your provider has requested that you go to the basement level for lab work before leaving today. Press "B" on the elevator. The lab is located at the first door on the left as you exit the elevator.   You have been scheduled for an MRI/MRCP at [facility] on 09/11/2019. Your appointment time is 9:00am. Please arrive 30 minutes prior to your appointment time for registration purposes. Please make certain not to have anything to eat or drink 4 hours prior to your test. In addition, if you have any metal in your body, have a pacemaker or defibrillator, please be sure to let your ordering physician know. This test typically takes 45 minutes to 1 hour to complete. Should you need to reschedule, please call [phone] to do so.  You have been scheduled for a colonoscopy. Please follow written instructions given to you at your visit today.  Please pick up your prep supplies at the pharmacy within the next 1-3 days. If you use inhalers (even only as needed), please bring them with you on the day of your procedure.    Abstain from alcohol

## 2019-08-26 ENCOUNTER — Encounter: Payer: Self-pay | Admitting: Gastroenterology

## 2019-08-27 ENCOUNTER — Telehealth: Payer: Self-pay | Admitting: Gastroenterology

## 2019-08-27 ENCOUNTER — Telehealth: Payer: Self-pay | Admitting: Primary Care

## 2019-08-27 MED ORDER — SUCRALFATE 1 G PO TABS
1.0000 g | ORAL_TABLET | Freq: Three times a day (TID) | ORAL | 1 refills | Status: DC
Start: 2019-08-27 — End: 2020-11-28

## 2019-08-27 MED FILL — SUCRALFATE 1 GM TABLET: 1 | 20 days supply | Qty: 80 | Fill #0

## 2019-08-27 NOTE — Telephone Encounter (Signed)
I recommend a trial of Carafate 1 gram slurry QID. Thank you.

## 2019-08-27 NOTE — Telephone Encounter (Signed)
Patient came into the office and requested to speak with the financial coordinator regarding tax forms. Please follow up at your earliest convenience.

## 2019-08-27 NOTE — Telephone Encounter (Signed)
Pt was call and inform what she need for applying for CAFA

## 2019-08-27 NOTE — Telephone Encounter (Signed)
Pt states she was just here yesterday. States she is now having pain in her stomach going through to her back. She is requesting medication for nausea and pain medication. Please advise.

## 2019-08-27 NOTE — Telephone Encounter (Signed)
Spoke with pt and she is aware. Script sent to pharmacy. 

## 2019-09-01 ENCOUNTER — Ambulatory Visit: Payer: Self-pay

## 2019-09-08 ENCOUNTER — Ambulatory Visit (INDEPENDENT_AMBULATORY_CARE_PROVIDER_SITE_OTHER): Payer: Self-pay

## 2019-09-08 ENCOUNTER — Other Ambulatory Visit: Payer: Self-pay | Admitting: Gastroenterology

## 2019-09-08 ENCOUNTER — Telehealth: Payer: Self-pay | Admitting: Internal Medicine

## 2019-09-08 DIAGNOSIS — Z1159 Encounter for screening for other viral diseases: Secondary | ICD-10-CM

## 2019-09-08 LAB — SARS CORONAVIRUS 2 (TAT 6-24 HRS): SARS Coronavirus 2: NEGATIVE

## 2019-09-08 NOTE — Telephone Encounter (Signed)
Patient called twice First time re: new prep times for change in appointment from [place] to 1130 AM  I gave her the correct times for second half of prep  Then she called back and said she was cancelling procedure because she ate solid food after noon - about 230  I explained that it was still ok to proceed but she declined. She had apparently received phone instructions about the appointment change and solid food etc but I cannot find documentation.  She wishes to reschedule the procedure so that she may fully comply with our prep instructions.

## 2019-09-08 NOTE — Telephone Encounter (Signed)
Thank you for the update!

## 2019-09-09 ENCOUNTER — Encounter: Payer: Self-pay | Admitting: Gastroenterology

## 2019-09-11 ENCOUNTER — Other Ambulatory Visit: Payer: Self-pay | Admitting: Gastroenterology

## 2019-09-11 ENCOUNTER — Ambulatory Visit (HOSPITAL_COMMUNITY)
Admission: RE | Admit: 2019-09-11 | Discharge: 2019-09-11 | Disposition: A | Discharge status: Home / Self Care | Payer: Self-pay | Source: Ambulatory Visit | Attending: Gastroenterology | Admitting: Gastroenterology

## 2019-09-11 ENCOUNTER — Other Ambulatory Visit: Payer: Self-pay

## 2019-09-11 DIAGNOSIS — R109 Unspecified abdominal pain: Secondary | ICD-10-CM | POA: Insufficient documentation

## 2019-09-11 MED ORDER — GADOBUTROL 1 MMOL/ML IV SOLN
10.0000 mL | Freq: Once | INTRAVENOUS | Status: AC | PRN
  Administered 2019-09-11: 10 mL via INTRAVENOUS

## 2019-09-15 ENCOUNTER — Ambulatory Visit (INDEPENDENT_AMBULATORY_CARE_PROVIDER_SITE_OTHER): Payer: Self-pay

## 2019-09-15 ENCOUNTER — Encounter: Payer: Self-pay | Admitting: Gastroenterology

## 2019-09-15 DIAGNOSIS — Z1159 Encounter for screening for other viral diseases: Secondary | ICD-10-CM

## 2019-09-16 ENCOUNTER — Telehealth: Payer: Self-pay

## 2019-09-16 NOTE — Telephone Encounter (Signed)
Pt is scheduled to see Dr. Tarri Glenn on 09/18/19 at 1530. Pt no showed covid test scheduled on 7/13. Tried to reach patient to see if she went to a different testing site or if she got the covid vaccine. Was unable to reach patient, left a voicemail to have her call office back. She will need to be rescheduled for a covid test prior to procedure on Friday.

## 2019-09-17 ENCOUNTER — Other Ambulatory Visit: Payer: Self-pay | Admitting: Gastroenterology

## 2019-09-17 ENCOUNTER — Ambulatory Visit (INDEPENDENT_AMBULATORY_CARE_PROVIDER_SITE_OTHER): Payer: Self-pay

## 2019-09-17 DIAGNOSIS — Z1159 Encounter for screening for other viral diseases: Secondary | ICD-10-CM

## 2019-09-18 ENCOUNTER — Other Ambulatory Visit: Payer: Self-pay

## 2019-09-18 ENCOUNTER — Ambulatory Visit (AMBULATORY_SURGERY_CENTER): Payer: Self-pay | Admitting: Gastroenterology

## 2019-09-18 ENCOUNTER — Encounter: Payer: Self-pay | Admitting: Gastroenterology

## 2019-09-18 VITALS — BP 165/96 | HR 80 | Temp 97.1°F | Resp 13 | Ht 67.0 in | Wt 199.0 lb

## 2019-09-18 DIAGNOSIS — K649 Unspecified hemorrhoids: Secondary | ICD-10-CM

## 2019-09-18 DIAGNOSIS — K625 Hemorrhage of anus and rectum: Secondary | ICD-10-CM

## 2019-09-18 DIAGNOSIS — D128 Benign neoplasm of rectum: Secondary | ICD-10-CM

## 2019-09-18 DIAGNOSIS — R194 Change in bowel habit: Secondary | ICD-10-CM

## 2019-09-18 DIAGNOSIS — D125 Benign neoplasm of sigmoid colon: Secondary | ICD-10-CM

## 2019-09-18 DIAGNOSIS — D123 Benign neoplasm of transverse colon: Secondary | ICD-10-CM

## 2019-09-18 DIAGNOSIS — D127 Benign neoplasm of rectosigmoid junction: Secondary | ICD-10-CM

## 2019-09-18 DIAGNOSIS — R195 Other fecal abnormalities: Secondary | ICD-10-CM

## 2019-09-18 LAB — SARS CORONAVIRUS 2 (TAT 6-24 HRS): SARS Coronavirus 2: NEGATIVE

## 2019-09-18 MED ORDER — SODIUM CHLORIDE 0.9 % IV SOLN: 500.0000 mL | Freq: Once | INTRAVENOUS | Status: DC

## 2019-09-18 NOTE — Progress Notes (Signed)
Pt's states no medical or surgical changes since previsit or office visit.  VS WR  Did not take BP med today  Do NOT USE paper tape--that is what she is allergic to.

## 2019-09-18 NOTE — Patient Instructions (Signed)
Handouts provided on polyps, diverticulosis, hemorrhoids and high fiber diet.   Add a daily stool bulking agent such as Citrucel. Increase to twice daily after one week if bowel habits are not more normal.   High fiber diet recommended. Drink at least 64 ounces of water daily.   YOU HAD AN ENDOSCOPIC PROCEDURE TODAY AT [facility]:   Refer to the procedure report that was given to you for any specific questions about what was found during the examination.  If the procedure report does not answer your questions, please call your gastroenterologist to clarify.  If you requested that your care partner not be given the details of your procedure findings, then the procedure report has been included in a sealed envelope for you to review at your convenience later.  YOU SHOULD EXPECT: Some feelings of bloating in the abdomen. Passage of more gas than usual.  Walking can help get rid of the air that was put into your GI tract during the procedure and reduce the bloating. If you had a lower endoscopy (such as a colonoscopy or flexible sigmoidoscopy) you may notice spotting of blood in your stool or on the toilet paper. If you underwent a bowel prep for your procedure, you may not have a normal bowel movement for a few days.  Please Note:  You might notice some irritation and congestion in your nose or some drainage.  This is from the oxygen used during your procedure.  There is no need for concern and it should clear up in a day or so.  SYMPTOMS TO REPORT IMMEDIATELY:   Following lower endoscopy (colonoscopy or flexible sigmoidoscopy):  Excessive amounts of blood in the stool  Significant tenderness or worsening of abdominal pains  Swelling of the abdomen that is new, acute  Fever of 100F or higher  For urgent or emergent issues, a gastroenterologist can be reached at any hour by calling [phone]. Do not use MyChart messaging for urgent concerns.    DIET:  We do recommend a  small meal at first, but then you may proceed to your regular diet.  Drink plenty of fluids but you should avoid alcoholic beverages for 24 hours.  ACTIVITY:  You should plan to take it easy for the rest of today and you should NOT DRIVE or use heavy machinery until tomorrow (because of the sedation medicines used during the test).    FOLLOW UP: Our staff will call the number listed on your records 48-72 hours following your procedure to check on you and address any questions or concerns that you may have regarding the information given to you following your procedure. If we do not reach you, we will leave a message.  We will attempt to reach you two times.  During this call, we will ask if you have developed any symptoms of COVID 19. If you develop any symptoms (ie: fever, flu-like symptoms, shortness of breath, cough etc.) before then, please call [phone].  If you test positive for Covid 19 in the 2 weeks post procedure, please call and report this information to [place].    If any biopsies were taken you will be contacted by phone or by letter within the next 1-3 weeks.  Please call us at [phone] if you have not heard about the biopsies in 3 weeks.    SIGNATURES/CONFIDENTIALITY: You and/or your care partner have signed paperwork which will be entered into your electronic medical record.  These signatures attest to the fact  that that the information above on your After Visit Summary has been reviewed and is understood.  Full responsibility of the confidentiality of this discharge information lies with you and/or your care-partner.

## 2019-09-18 NOTE — Progress Notes (Signed)
pt tolerated well. VSS. awake and to recovery. Report given to RN.  

## 2019-09-18 NOTE — Progress Notes (Signed)
Called to room to assist during endoscopic procedure.  Patient ID and intended procedure confirmed with present staff. Received instructions for my participation in the procedure from the performing physician.  

## 2019-09-18 NOTE — Op Note (Signed)
[place] Patient Name: Kiaria Quinnell Procedure Date: 09/18/2019 2:43 PM MRN: [id number] Endoscopist: Thornton Park MD, MD Age: 49 Referring MD:  Date of Birth: Mar 03, 1971 Gender: Female Account #: [id number] Procedure:                Colonoscopy Indications:              Change in bowel habits - mucous in the stool, feels                            like they smell like iron                           Rectal bleeding x 2 with no prior colonoscopy                           Acute on chronic upper abdominal pain                           - recurrent symptoms of pancreatitis but not                            biochemical or radiographic evidence for                            pancreatitis Medicines:                Monitored Anesthesia Care Procedure:                Pre-Anesthesia Assessment:                           - Prior to the procedure, a History and Physical                            was performed, and patient medications and                            allergies were reviewed. The patient's tolerance of                            previous anesthesia was also reviewed. The risks                            and benefits of the procedure and the sedation                            options and risks were discussed with the patient.                            All questions were answered, and informed consent                            was obtained. Prior Anticoagulants: The patient has  taken no previous anticoagulant or antiplatelet                            agents. ASA Grade Assessment: III - A patient with                            severe systemic disease. After reviewing the risks                            and benefits, the patient was deemed in                            satisfactory condition to undergo the procedure.                           After obtaining informed consent, the colonoscope                            was passed under  direct vision. Throughout the                            procedure, the patient's blood pressure, pulse, and                            oxygen saturations were monitored continuously. The                            Colonoscope was introduced through the anus and                            advanced to the the cecum, identified by                            appendiceal orifice and ileocecal valve. The                            colonoscopy was performed without difficulty. The                            patient tolerated the procedure well. The quality                            of the bowel preparation was good. The ileocecal                            valve, appendiceal orifice, and rectum were                            photographed. Scope In: 2:59:03 PM Scope Out: 3:15:51 PM Scope Withdrawal Time: 0 hours 12 minutes 1 second  Total Procedure Duration: 0 hours 16 minutes 48 seconds  Findings:                 Hemorrhoids were found on perianal exam.  Non-bleeding internal hemorrhoids were found.                           Two sessile polyps were found in the rectum and                            transverse colon. The polyps were 2 to 3 mm in                            size. These polyps were removed with a cold snare.                            Resection and retrieval were complete. Estimated                            blood loss was minimal.                           A less than 1 mm polyp was found in the sigmoid                            colon. The polyp was flat. The polyp was removed                            with a cold biopsy forceps. Resection and retrieval                            were complete. Estimated blood loss was minimal.                           The colon (entire examined portion) appeared                            normal. Biopsies for histology were taken with a                            cold forceps from the right colon and left colon                             for evaluation of microscopic colitis. Estimated                            blood loss was minimal.                           A few small and large-mouthed diverticula were                            found in the sigmoid colon and descending colon.                           The exam was otherwise without abnormality on  direct and retroflexion views. Complications:            No immediate complications. Estimated blood loss:                            Minimal. Estimated Blood Loss:     Estimated blood loss was minimal. Impression:               - Hemorrhoids found on perianal exam.                           - Non-bleeding internal hemorrhoids. The likely                            source of bleeding.                           - Two 2 to 3 mm polyps in the rectum and in the                            transverse colon, removed with a cold snare.                            Resected and retrieved.                           - One less than 1 mm polyp in the sigmoid colon,                            removed with a cold biopsy forceps. Resected and                            retrieved.                           - The entire examined colon is normal. Biopsied for                            microscopic colitis.                           - The examination was otherwise normal on direct                            and retroflexion views. Recommendation:           - Patient has a contact number available for                            emergencies. The signs and symptoms of potential                            delayed complications were discussed with the                            patient. Return to normal activities tomorrow.  Written discharge instructions were provided to the                            patient.                           - Resume previous diet. High fiber diet                            recommended. Drink at  least 64 ounces of water                            daily.                           - Continue present medications.                           - Add a daily stool bulking agent such as Citrucel.                            Increase to twice daily after one week if bowel                            habits are not more normal.                           - Await pathology results.                           - Repeat colonoscopy date to be determined after                            pending pathology results are reviewed for                            surveillance.                           - Emerging evidence supports eating a diet of                            fruits, vegetables, grains, calcium, and yogurt                            while reducing red meat and alcohol may reduce the                            risk of colon cancer.                           - Thank you for allowing me to be involved in your                            colon cancer prevention. Thornton Park MD, MD 09/18/2019 3:28:20 PM This  report has been signed electronically.

## 2019-09-22 ENCOUNTER — Telehealth: Payer: Self-pay | Admitting: *Deleted

## 2019-09-22 ENCOUNTER — Other Ambulatory Visit: Payer: Self-pay

## 2019-09-22 NOTE — Telephone Encounter (Signed)
No answering reached on f/u call

## 2019-09-23 ENCOUNTER — Encounter: Payer: Self-pay | Admitting: Gastroenterology

## 2019-10-13 IMAGING — CT CT ABD-PELV W/ CM
2 of 5 series · 16 of 46 positions shown, 18 images · IV contrast (APPLIED)
Comparison: Radiograph 12/23/2017, CT 12/05/2017, 12/12/2016

CLINICAL DATA: Upper abdominal pain with nausea and vomiting

EXAM:
CT ABDOMEN AND PELVIS WITH CONTRAST
TECHNIQUE: Multidetector CT imaging of the abdomen and pelvis was performed
using the standard protocol following bolus administration of
intravenous contrast.
CONTRAST:  100mL OMNIPAQUE IOHEXOL 300 MG/ML  SOLN

[Series 3: abdomen 5.0 · axial · 0.89mm/px · z∈[-889,-454]mm · 13 of 101 slices shown, 15 images]
[im 7/101  soft-tissue]
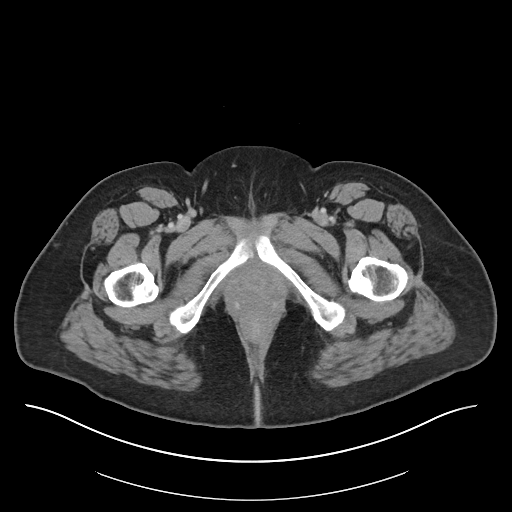
[im 7/101  bone]
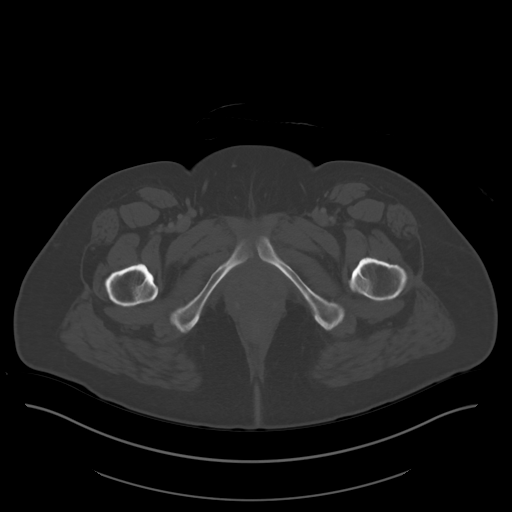
[im 14/101  soft-tissue]
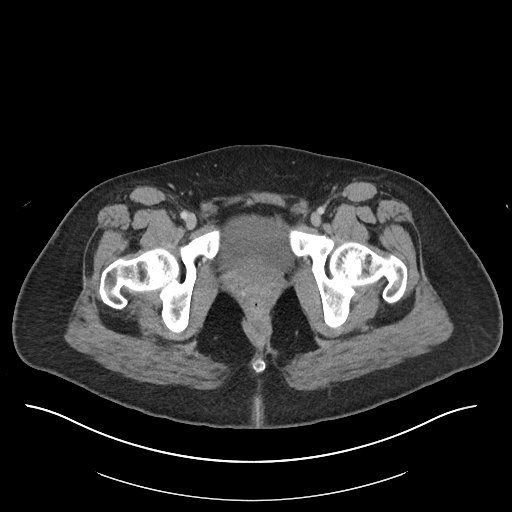
[im 21/101  soft-tissue]
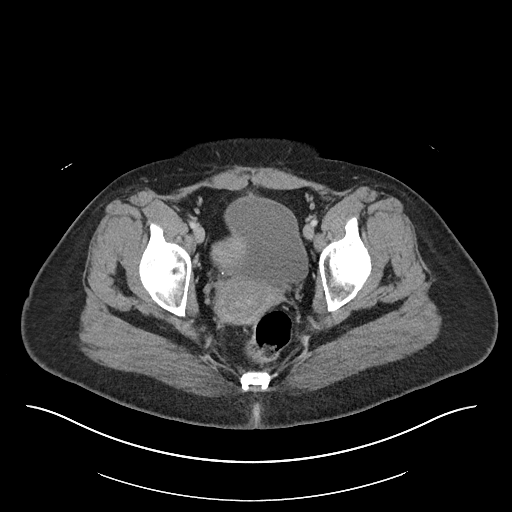
[im 27/101  soft-tissue]
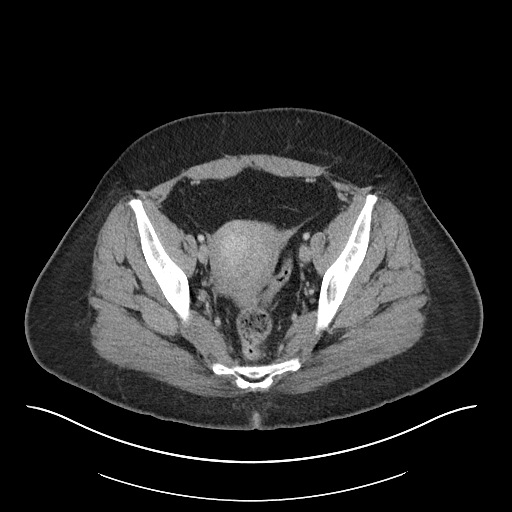
[im 34/101  soft-tissue]
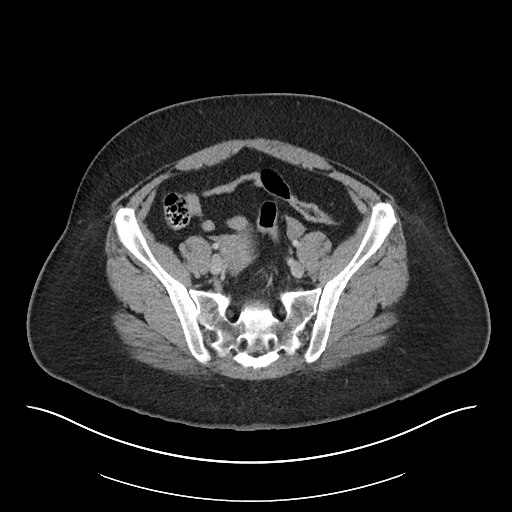
[im 41/101  soft-tissue]
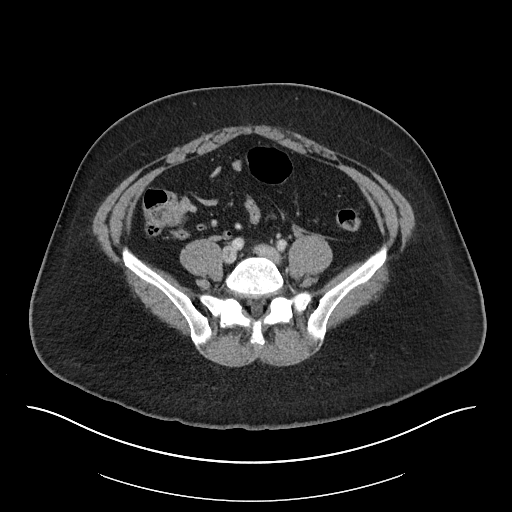
[im 54/101  soft-tissue]
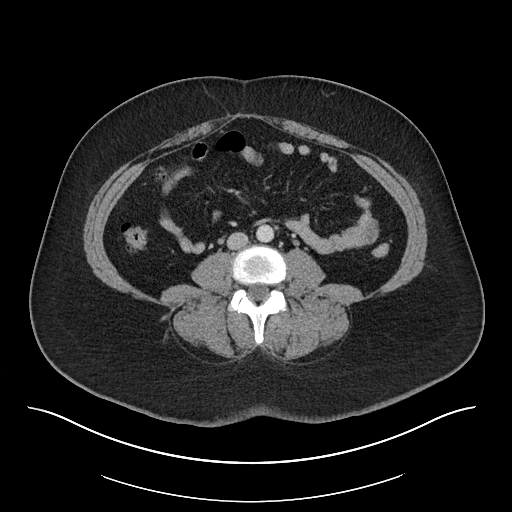
[im 61/101  soft-tissue]
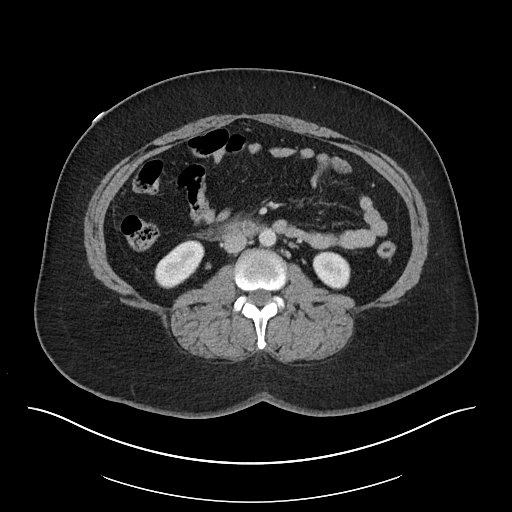
[im 67/101  soft-tissue]
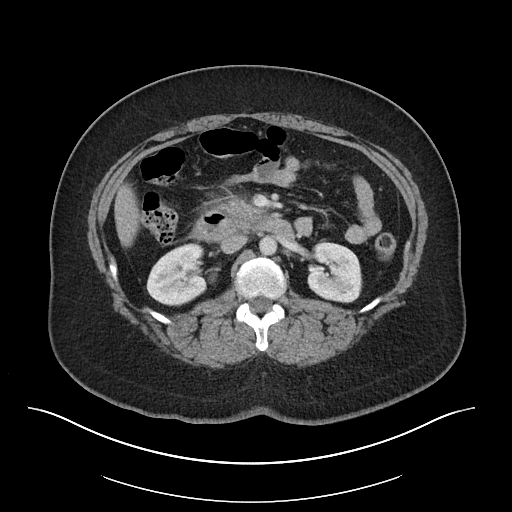
[im 67/101  bone]
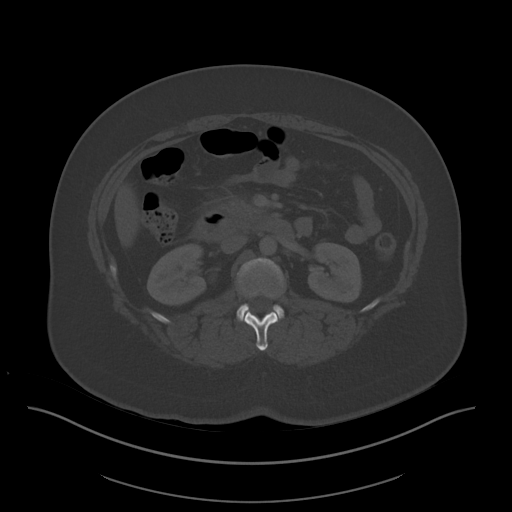
[im 74/101  soft-tissue]
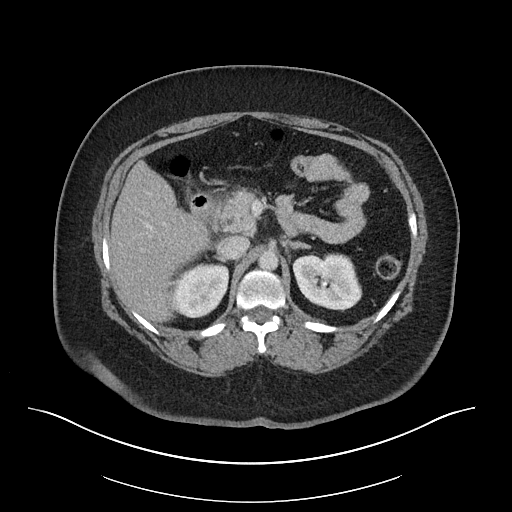
[im 81/101  soft-tissue]
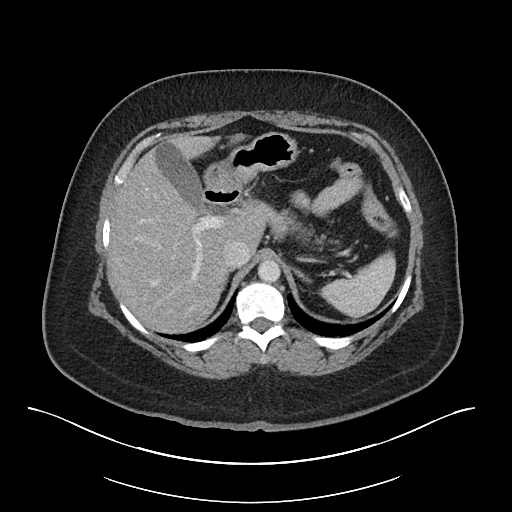
[im 87/101  soft-tissue]
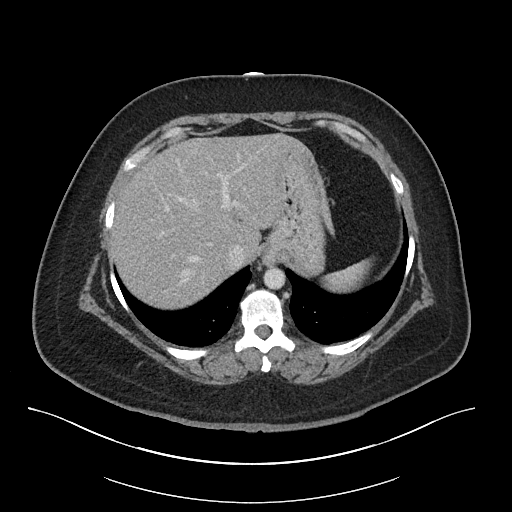
[im 94/101  soft-tissue]
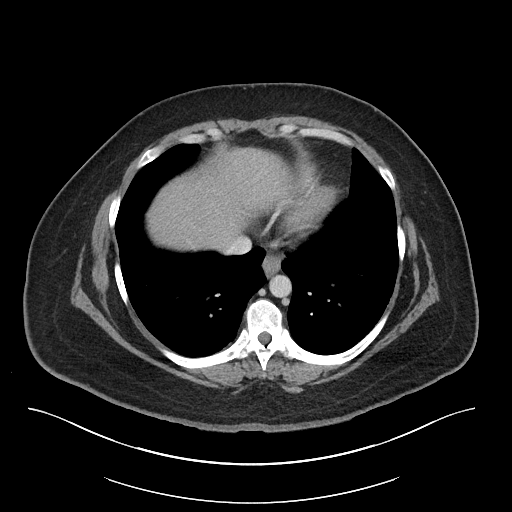

[Series 6: abdomen 3.0 mpr cor · coronal · 0.82mm/px · 3 of 88 slices shown]
[im 30/88  soft-tissue]
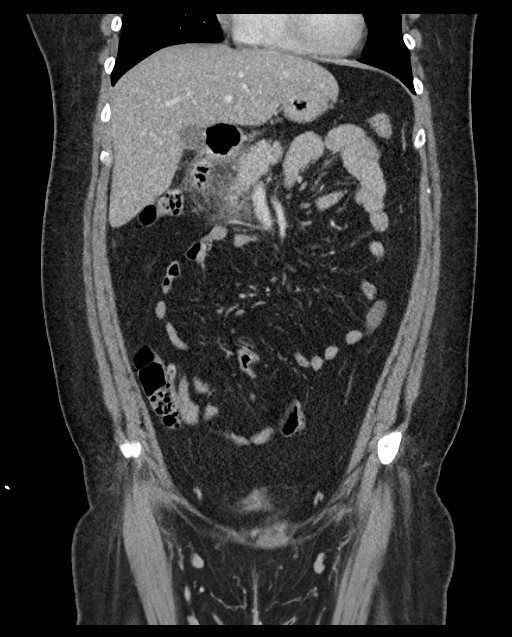
[im 39/88  soft-tissue]
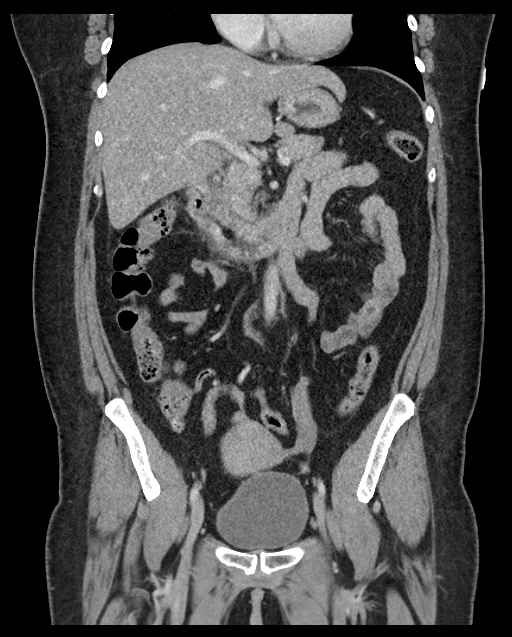
[im 49/88  soft-tissue]
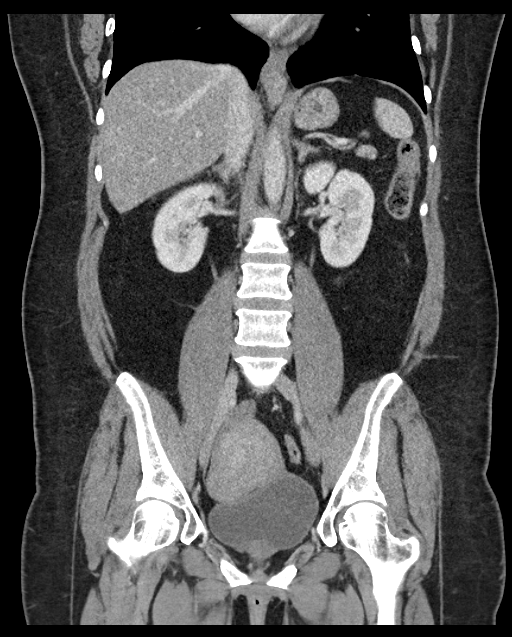

[16 of 46 positions shown; findings below may reference images not displayed]

FINDINGS: Lower chest: Lung bases demonstrate no acute consolidation or
effusion. Heart size is normal

Hepatobiliary: No focal liver abnormality is seen. No gallstones,
gallbladder wall thickening, or biliary dilatation.

Pancreas: Enlargement of the pancreatic head and uncinate process
with soft tissue stranding, consistent with acute pancreatitis. No
focal fluid collection. No evidence for necrosis.

Spleen: Normal in size without focal abnormality.

Adrenals/Urinary Tract: Adrenal glands are unremarkable. Kidneys are
normal, without renal calculi, focal lesion, or hydronephrosis.
Bladder is unremarkable.

Stomach/Bowel: Mild duodenal wall thickening. No dilated small
bowel. Negative appendix. No colon wall thickening

Vascular/Lymphatic: Nonaneurysmal aorta. Normal enhancement of the
portal and splenic veins. No significantly enlarged lymph nodes.

Reproductive: Fibroid uterus.  No adnexal mass

Other: No abdominal wall hernia or abnormality. No abdominopelvic
ascites.

Musculoskeletal: No acute or significant osseous findings.
IMPRESSION: 1. Findings consistent with acute pancreatitis involving the head
and uncinate process of the pancreas. No evidence for necrosis or
organized fluid collection at this time.
2. Mild duodenal wall thickening, likely reactive to inflammatory
change at the pancreas
3. Fibroid uterus

## 2019-11-30 ENCOUNTER — Telehealth (INDEPENDENT_AMBULATORY_CARE_PROVIDER_SITE_OTHER): Payer: Self-pay | Admitting: Primary Care

## 2019-12-09 ENCOUNTER — Other Ambulatory Visit: Payer: Self-pay

## 2019-12-09 ENCOUNTER — Ambulatory Visit (INDEPENDENT_AMBULATORY_CARE_PROVIDER_SITE_OTHER): Payer: Self-pay | Admitting: Primary Care

## 2019-12-09 ENCOUNTER — Encounter (INDEPENDENT_AMBULATORY_CARE_PROVIDER_SITE_OTHER): Payer: Self-pay | Admitting: Primary Care

## 2019-12-09 VITALS — BP 120/82 | HR 90 | Temp 98.2°F | Ht 67.0 in | Wt 180.2 lb

## 2019-12-09 DIAGNOSIS — H9313 Tinnitus, bilateral: Secondary | ICD-10-CM

## 2019-12-09 DIAGNOSIS — K860 Alcohol-induced chronic pancreatitis: Secondary | ICD-10-CM

## 2019-12-09 DIAGNOSIS — F17200 Nicotine dependence, unspecified, uncomplicated: Secondary | ICD-10-CM

## 2019-12-09 DIAGNOSIS — I10 Essential (primary) hypertension: Secondary | ICD-10-CM

## 2019-12-09 DIAGNOSIS — D1722 Benign lipomatous neoplasm of skin and subcutaneous tissue of left arm: Secondary | ICD-10-CM

## 2019-12-09 DIAGNOSIS — N898 Other specified noninflammatory disorders of vagina: Secondary | ICD-10-CM

## 2019-12-09 MED ORDER — HYDROCHLOROTHIAZIDE 25 MG PO TABS: 25.0000 mg | ORAL_TABLET | Freq: Every day | ORAL | 0 refills | Status: DC
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
## 2019-12-09 NOTE — Progress Notes (Signed)
Pancreatitis flare up States she has these little things on her head

## 2019-12-09 NOTE — Patient Instructions (Addendum)
Schedule appointment with Dr. Hulen Skains lipoma   Tinnitus Tinnitus refers to hearing a sound when there is no actual source for that sound. This is often described as ringing in the ears. However, people with this condition may hear a variety of noises, in one ear or in both ears. The sounds of tinnitus can be soft, loud, or somewhere in between. Tinnitus can last for a few seconds or can be constant for days. It may go away without treatment and come back at various times. When tinnitus is constant or happens often, it can lead to other problems, such as trouble sleeping and trouble concentrating. Almost everyone experiences tinnitus at some point. Tinnitus that is long-lasting (chronic) or comes back often (recurs) may require medical attention. What are the causes? The cause of tinnitus is often not known. In some cases, it can result from:  Exposure to loud noises from machinery, music, or other sources.  An object (foreign body) stuck in the ear.  Earwax buildup.  Drinking alcohol or caffeine.  Taking certain medicines.  Age-related hearing loss. It may also be caused by medical conditions such as:  Ear or sinus infections.  High blood pressure.  Heart diseases.  Anemia.  Allergies.  Meniere's disease.  Thyroid problems.  Tumors.  A weak, bulging blood vessel (aneurysm) near the ear. What are the signs or symptoms? The main symptom of tinnitus is hearing a sound when there is no source for that sound. It may sound like:  Buzzing.  Roaring.  Ringing.  Blowing air.  Hissing.  Whistling.  Sizzling.  Humming.  Running water.  A musical note.  Tapping. Symptoms may affect only one ear (unilateral) or both ears (bilateral). How is this diagnosed? Tinnitus is diagnosed based on your symptoms, your medical history, and a physical exam. Your health care provider may do a thorough hearing test (audiologic exam) if your tinnitus:  Is unilateral.  Causes  hearing difficulties.  Lasts 6 months or longer. You may work with a health care provider who specializes in hearing disorders (audiologist). You may be asked questions about your symptoms and how they affect your daily life. You may have other tests done, such as:  CT scan.  MRI.  An imaging test of how blood flows through your blood vessels (angiogram). How is this treated? Treating an underlying medical condition can sometimes make tinnitus go away. If your tinnitus continues, other treatments may include:  Medicines.  Therapy and counseling to help you manage the stress of living with tinnitus.  Sound generators to mask the tinnitus. These include: ? Tabletop sound machines that play relaxing sounds to help you fall asleep. ? Wearable devices that fit in your ear and play sounds or music. ? Acoustic neural stimulation. This involves using headphones to listen to music that contains an auditory signal. Over time, listening to this signal may change some pathways in your brain and make you less sensitive to tinnitus. This treatment is used for very severe cases when no other treatment is working.  Using hearing aids or cochlear implants if your tinnitus is related to hearing loss. Hearing aids are worn in the outer ear. Cochlear implants are surgically placed in the inner ear. Follow these instructions at home: Managing symptoms      When possible, avoid being in loud places and being exposed to loud sounds.  Wear hearing protection, such as earplugs, when you are exposed to loud noises.  Use a white noise machine, a humidifier, or other  devices to mask the sound of tinnitus.  Practice techniques for reducing stress, such as meditation, yoga, or deep breathing. Work with your health care provider if you need help with managing stress.  Sleep with your head slightly raised. This may reduce the impact of tinnitus. General instructions  Do not use stimulants, such as nicotine,  alcohol, or caffeine. Talk with your health care provider about other stimulants to avoid. Stimulants are substances that can make you feel alert and attentive by increasing certain activities in the body (such as heart rate and blood pressure). These substances may make tinnitus worse.  Take over-the-counter and prescription medicines only as told by your health care provider.  Try to get plenty of sleep each night.  Keep all follow-up visits as told by your health care provider. This is important. Contact a health care provider if:  Your tinnitus continues for 3 weeks or longer without stopping.  You develop sudden hearing loss.  Your symptoms get worse or do not get better with home care.  You feel you are not able to manage the stress of living with tinnitus. Get help right away if:  You develop tinnitus after a head injury.  You have tinnitus along with any of the following: ? Dizziness. ? Loss of balance. ? Nausea and vomiting. ? Sudden, severe headache. These symptoms may represent a serious problem that is an emergency. Do not wait to see if the symptoms will go away. Get medical help right away. Call your local emergency services (911 in the [place] Do not drive yourself to the hospital. Summary  Tinnitus refers to hearing a sound when there is no actual source for that sound. This is often described as ringing in the ears.  Symptoms may affect only one ear (unilateral) or both ears (bilateral).  Use a white noise machine, a humidifier, or other devices to mask the sound of tinnitus.  Do not use stimulants, such as nicotine, alcohol, or caffeine. Talk with your health care provider about other stimulants to avoid. These substances may make tinnitus worse. This information is not intended to replace advice given to you by your health care provider. Make sure you discuss any questions you have with your health care provider. Document Revised: 09/03/2018 Document Reviewed:  11/29/2016 Elsevier Patient Education  2020 [employer].

## 2019-12-10 ENCOUNTER — Other Ambulatory Visit (HOSPITAL_COMMUNITY)
Admission: RE | Admit: 2019-12-10 | Discharge: 2019-12-10 | Disposition: A | Discharge status: Home / Self Care | Payer: Self-pay | Source: Ambulatory Visit | Attending: Primary Care | Admitting: Primary Care

## 2019-12-10 DIAGNOSIS — N898 Other specified noninflammatory disorders of vagina: Secondary | ICD-10-CM | POA: Insufficient documentation

## 2019-12-10 MED FILL — HYDROCHLOROTHIAZIDE 25 MG T: 25 | 30 days supply | Qty: 30 | Fill #0

## 2019-12-13 LAB — CERVICOVAGINAL ANCILLARY ONLY
Bacterial Vaginitis (gardnerella): POSITIVE — AB
Candida Glabrata: NEGATIVE
Candida Vaginitis: NEGATIVE
Chlamydia: NEGATIVE
Comment: NEGATIVE
Comment: NEGATIVE
Comment: NEGATIVE
Comment: NEGATIVE
Comment: NEGATIVE
Comment: NORMAL
Neisseria Gonorrhea: NEGATIVE
Trichomonas: NEGATIVE

## 2019-12-13 MED ORDER — METRONIDAZOLE 500 MG PO TABS: 500.0000 mg | ORAL_TABLET | Freq: Two times a day (BID) | ORAL | 0 refills | Status: DC

## 2019-12-14 ENCOUNTER — Telehealth (INDEPENDENT_AMBULATORY_CARE_PROVIDER_SITE_OTHER): Payer: Self-pay

## 2019-12-14 MED FILL — metroNIDAZOLE 500 MG TABS: 500 | 7 days supply | Qty: 14 | Fill #0

## 2019-12-14 NOTE — Telephone Encounter (Signed)
Patient is aware of results. She viewed them on mychart. She did not have any questions and will pick up medication today. Nat Christen, CMA

## 2019-12-22 ENCOUNTER — Ambulatory Visit: Payer: Self-pay | Admitting: General Surgery

## 2019-12-30 ENCOUNTER — Emergency Department (HOSPITAL_COMMUNITY)
Admission: EM | Admit: 2019-12-30 | Discharge: 2019-12-30 | Disposition: A | Discharge status: Home / Self Care | Payer: Self-pay | Attending: Emergency Medicine | Admitting: Emergency Medicine

## 2019-12-30 ENCOUNTER — Other Ambulatory Visit (HOSPITAL_COMMUNITY): Payer: Self-pay | Admitting: Physician Assistant

## 2019-12-30 ENCOUNTER — Other Ambulatory Visit: Payer: Self-pay

## 2019-12-30 ENCOUNTER — Encounter (HOSPITAL_COMMUNITY): Payer: Self-pay

## 2019-12-30 DIAGNOSIS — J45909 Unspecified asthma, uncomplicated: Secondary | ICD-10-CM | POA: Insufficient documentation

## 2019-12-30 DIAGNOSIS — Z79899 Other long term (current) drug therapy: Secondary | ICD-10-CM | POA: Insufficient documentation

## 2019-12-30 DIAGNOSIS — N189 Chronic kidney disease, unspecified: Secondary | ICD-10-CM | POA: Insufficient documentation

## 2019-12-30 DIAGNOSIS — N3001 Acute cystitis with hematuria: Secondary | ICD-10-CM | POA: Insufficient documentation

## 2019-12-30 DIAGNOSIS — F17210 Nicotine dependence, cigarettes, uncomplicated: Secondary | ICD-10-CM | POA: Insufficient documentation

## 2019-12-30 DIAGNOSIS — I129 Hypertensive chronic kidney disease with stage 1 through stage 4 chronic kidney disease, or unspecified chronic kidney disease: Secondary | ICD-10-CM | POA: Insufficient documentation

## 2019-12-30 DIAGNOSIS — Z91040 Latex allergy status: Secondary | ICD-10-CM | POA: Insufficient documentation

## 2019-12-30 DIAGNOSIS — K219 Gastro-esophageal reflux disease without esophagitis: Secondary | ICD-10-CM | POA: Insufficient documentation

## 2019-12-30 LAB — CBC WITH DIFFERENTIAL/PLATELET
Abs Immature Granulocytes: 0.01 10*3/uL (ref 0.00–0.07)
Basophils Absolute: 0.1 10*3/uL (ref 0.0–0.1)
Basophils Relative: 1 %
Eosinophils Absolute: 0.1 10*3/uL (ref 0.0–0.5)
Eosinophils Relative: 2 %
HCT: 36.7 % (ref 36.0–46.0)
Hemoglobin: 11.6 g/dL — ABNORMAL LOW (ref 12.0–15.0)
Immature Granulocytes: 0 %
Lymphocytes Relative: 34 %
Lymphs Abs: 2.3 10*3/uL (ref 0.7–4.0)
MCH: 26.9 pg (ref 26.0–34.0)
MCHC: 31.6 g/dL (ref 30.0–36.0)
MCV: 85 fL (ref 80.0–100.0)
Monocytes Absolute: 0.6 10*3/uL (ref 0.1–1.0)
Monocytes Relative: 8 %
Neutro Abs: 3.7 10*3/uL (ref 1.7–7.7)
Neutrophils Relative %: 55 %
Platelets: 364 10*3/uL (ref 150–400)
RBC: 4.32 MIL/uL (ref 3.87–5.11)
RDW: 20.1 % — ABNORMAL HIGH (ref 11.5–15.5)
WBC: 6.8 10*3/uL (ref 4.0–10.5)
nRBC: 0 % (ref 0.0–0.2)

## 2019-12-30 LAB — URINALYSIS, ROUTINE W REFLEX MICROSCOPIC
Glucose, UA: NEGATIVE mg/dL
Hgb urine dipstick: NEGATIVE
Ketones, ur: 5 mg/dL — AB
Nitrite: NEGATIVE
Protein, ur: 30 mg/dL — AB
Specific Gravity, Urine: 1.026 (ref 1.005–1.030)
pH: 5 (ref 5.0–8.0)

## 2019-12-30 LAB — COMPREHENSIVE METABOLIC PANEL
ALT: 27 U/L (ref 0–44)
AST: 55 U/L — ABNORMAL HIGH (ref 15–41)
Albumin: 3.9 g/dL (ref 3.5–5.0)
Alkaline Phosphatase: 90 U/L (ref 38–126)
Anion gap: 12 (ref 5–15)
BUN: 7 mg/dL (ref 6–20)
CO2: 25 mmol/L (ref 22–32)
Calcium: 9.6 mg/dL (ref 8.9–10.3)
Chloride: 100 mmol/L (ref 98–111)
Creatinine, Ser: 0.74 mg/dL (ref 0.44–1.00)
GFR, Estimated: >60 mL/min (ref >60)
Glucose, Bld: 107 mg/dL — ABNORMAL HIGH (ref 70–99)
Potassium: 3.7 mmol/L (ref 3.5–5.1)
Sodium: 137 mmol/L (ref 135–145)
Total Bilirubin: 0.9 mg/dL (ref 0.3–1.2)
Total Protein: 7.5 g/dL (ref 6.5–8.1)

## 2019-12-30 LAB — LIPASE, BLOOD: Lipase: 21 U/L (ref 11–51)

## 2019-12-30 MED ORDER — NAPROXEN 500 MG PO TABS: 500.0000 mg | ORAL_TABLET | Freq: Two times a day (BID) | ORAL | 0 refills | Status: DC | PRN

## 2019-12-30 MED ORDER — CEPHALEXIN 500 MG PO CAPS: 500.0000 mg | ORAL_CAPSULE | Freq: Three times a day (TID) | ORAL | 0 refills | Status: DC

## 2019-12-30 MED ORDER — PHENAZOPYRIDINE HCL 200 MG PO TABS: 200.0000 mg | ORAL_TABLET | Freq: Three times a day (TID) | ORAL | 0 refills | Status: DC | PRN

## 2019-12-30 MED FILL — CEPHALEXIN 500 MG CAPSULE: 500 | 5 days supply | Qty: 15 | Fill #0

## 2019-12-30 MED FILL — PHENAZOPYRIDINE 100 MG TAB: 100 | 2 days supply | Qty: 12 | Fill #0

## 2019-12-30 MED FILL — NAPROXEN 500 MG TABLET: 500 | 15 days supply | Qty: 30 | Fill #0

## 2019-12-30 NOTE — ED Triage Notes (Signed)
Pt presents with Right flank pain radiating to her suprapubic area x2.5 weeks. Seen her PCP last week, they did not order a urine test.

## 2019-12-30 NOTE — ED Provider Notes (Signed)
[facility] EMERGENCY DEPARTMENT Provider Note   CSN: [id number] Arrival date & time: 12/30/19  1028     History Chief Complaint  Patient presents with  . Flank Pain    [name] is a 49 y.o. female with PMH of HTN, tobacco use disorder, and alcohol induced chronic pancreatitis who presents the ED with a 2.5-week history of right-sided flank pain rating to her suprapubic region.  On my examination, patient is having multiple complaints.  She tells me that her primary care provider does not listen to her address her needs.  She is followed by [facility].  Patient reports that she has had diminished appetite x1 month.  She also has a 2 to 3-week history of right-sided flank pain rating into her groin.  She states that she was diagnosed with bacterial vaginosis, but has not picked up her metronidazole prescription.  She also is supposed to be wearing a CPAP machine for her OSA, but believes that she needs to have another sleep study so that she can have her breathing adjusted.  She had STI testing obtained earlier this month which was all negative.  She denies any vaginal discharge.  She also has been having intermittent nausea symptoms, no hematemesis.  She has been taking Zofran, with good effect.  She denies any chest pain, but sometimes feels short of breath with exertion.  Her SOB has unclear chronicity.  She feels as though most of her complaints can be worked up on outpatient basis, but feels as though her complaints are not being adequately addressed.  She is also endorsing increased urinary hesitance and difficulty, but no dysuria.  No malodorous urine or vaginal discharge.  She also sometimes feels "hot and cold" but denies any objective fevers.  HPI     Past Medical History:  Diagnosis Date  . Alcoholism ([place])   . Allergy    seasonal  . Anxiety   . Arthritis   . Asthma   . Blood transfusion    1989 at [place]  .  Bronchitis   . Chronic bipolar disorder ([place])   . Chronic kidney disease   . Depression    bipolar  . GERD (gastroesophageal reflux disease)   . Headache(784.0)    occasional  . Hypertension   . Mental disorder    bipolar  . MRSA infection 2011   left lower leg  . Pancreatitis   . Pyelonephritis 10/2010  . Recurrent upper respiratory infection (URI)    states she has not been to MD and feels like she has  bronchitis now- greenish sputum  . Shortness of breath    sometimes with exertion  . Sickle cell anemia (H[facility])    sickle cell trait   . Sickle cell trait ([place])   . Sleep apnea    CPAP  . Substance abuse ([place])    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  . UTI (lower urinary tract infection) 10/2010    Patient Active Problem List   Diagnosis Date Noted  . Essential hypertension, benign 08/28/2018  . Seasonal allergies 08/28/2018  . Herpes simplex infection 04/07/2018  . Other tear of medial meniscus, current injury, left knee, subsequent encounter 03/27/2018  . Fracture of tibial plateau, closed 03/08/2011  . GERD (gastroesophageal reflux disease) 03/08/2011  . Bipolar disorder ([address]) 03/08/2011  . OSA (obstructive sleep apnea) 03/08/2011  . Obesity 03/08/2011  . Nicotine dependence 03/08/2011    Past Surgical History:  Procedure Laterality Date  .  CESAREAN SECTION    . KNEE ARTHROSCOPY  06/15/2011   Procedure: ARTHROSCOPY KNEE;  Surgeon: Rozanna Box, MD;  Location: [facility];  Service: Orthopedics;  Laterality: Left;  LEFT KNEE SCOPE WITH LYSIS OF ADHESIONS AND MANIPULATION  . KNEE ARTHROSCOPY WITH MEDIAL MENISECTOMY Left 03/27/2018   Procedure: LEFT KNEE ARTHROSCOPY WITH PARTIAL MEDIAL MENISCECTOMY;  Surgeon: Mcarthur Rossetti, MD;  Location: [facility];  Service: Orthopedics;  Laterality: Left;  . ORIF TIBIA PLATEAU  03/08/2011   Procedure: OPEN REDUCTION INTERNAL FIXATION (ORIF) TIBIAL PLATEAU;  Surgeon: Rozanna Box;  Location: [facility];  Service: Orthopedics;   Laterality: Left;  . TUBAL LIGATION       OB History    Gravida  8   Para  4   Term      Preterm      AB  4   Living  4     SAB  2   TAB  2   Ectopic      Multiple      Live Births              Family History  Problem Relation Age of Onset  . Thyroid disease Father   . Colon cancer Father   . Cystic fibrosis Sister   . Heart disease Brother   . Esophageal cancer Paternal Grandmother   . Anesthesia problems Neg Hx   . Breast cancer Neg Hx   . Colon polyps Neg Hx   . Rectal cancer Neg Hx   . Stomach cancer Neg Hx     Social History   Tobacco Use  . Smoking status: Current Every Day Smoker    Packs/day: 0.50    Years: 24.00    Pack years: 12.00    Types: Cigarettes  . Smokeless tobacco: Never Used  Vaping Use  . Vaping Use: Never used  Substance Use Topics  . Alcohol use: Yes    Comment: Rare  . Drug use: Not Currently    Types: "Crack" cocaine    Comment: none since 2000    Home Medications Prior to Admission medications   Medication Sig Start Date End Date Taking? Authorizing Provider  amLODipine (NORVASC) 10 MG tablet Take 1 tablet daily 06/10/19  Yes Kerin Perna, NP  famotidine (PEPCID) 20 MG tablet Take 1 tablet (20 mg total) by mouth 2 (two) times daily. 08/25/19  Yes Irene Shipper, MD  hydrochlorothiazide (HYDRODIURIL) 25 MG tablet Take 1 tablet (25 mg total) by mouth daily. 12/09/19  Yes Kerin Perna, NP  cephALEXin (KEFLEX) 500 MG capsule Take 1 capsule (500 mg total) by mouth 3 (three) times daily for 5 days. 12/30/19 01/04/20  Corena Herter, PA-C  metroNIDAZOLE (FLAGYL) 500 MG tablet Take 1 tablet (500 mg total) by mouth 2 (two) times daily. 12/13/19   Kerin Perna, NP  naproxen (NAPROSYN) 500 MG tablet Take 1 tablet (500 mg total) by mouth 2 (two) times daily as needed for moderate pain. 12/30/19   Corena Herter, PA-C  phenazopyridine (PYRIDIUM) 200 MG tablet Take 1 tablet (200 mg total) by mouth 3 (three)  times daily as needed for pain. 12/30/19   Corena Herter, PA-C  sucralfate (CARAFATE) 1 g tablet Take 1 tablet (1 g total) by mouth 4 (four) times daily -  with meals and at bedtime. Dissolve tablet in 1 tablespoon of warm water and swallow Patient not taking: Reported on 12/09/2019 08/27/19   [place],  MD    Allergies    Aspirin, Banana, Latex, Penicillins, Septra [bactrim], Shellfish allergy, Strawberry extract, and Tape  Review of Systems   Review of Systems  All other systems reviewed and are negative.   Physical Exam Updated Vital Signs BP 123/79   Pulse 69   Temp 98.2 F (36.8 C) (Oral)   Resp 18   LMP 12/03/2019 (Exact Date)   SpO2 100%   Physical Exam Vitals and nursing note reviewed. Exam conducted with a chaperone present.  Constitutional:      General: She is not in acute distress.    Appearance: Normal appearance. She is not ill-appearing.  HENT:     Head: Normocephalic and atraumatic.  Eyes:     General: No scleral icterus.    Conjunctiva/sclera: Conjunctivae normal.  Cardiovascular:     Rate and Rhythm: Normal rate and regular rhythm.     Pulses: Normal pulses.     Heart sounds: Normal heart sounds.  Pulmonary:     Effort: Pulmonary effort is normal. No respiratory distress.     Breath sounds: Normal breath sounds. No wheezing or rales.  Abdominal:     General: There is no distension.     Palpations: Abdomen is soft.     Tenderness: There is abdominal tenderness. There is no guarding.     Comments: Soft, nondistended.  Generalized TTP, most notably in suprapubic and RLQ regions.  Mild epigastric TTP, as well.  No guarding.  Mild right-sided CVAT.  No left-sided CVAT.  No overlying skin changes.  Musculoskeletal:     Right lower leg: No edema.     Left lower leg: No edema.  Skin:    General: Skin is dry.     Capillary Refill: Capillary refill takes less than 2 seconds.  Neurological:     Mental Status: She is alert and oriented to person,  place, and time.     GCS: GCS eye subscore is 4. GCS verbal subscore is 5. GCS motor subscore is 6.  Psychiatric:        Mood and Affect: Mood normal.        Behavior: Behavior normal.        Thought Content: Thought content normal.     ED Results / Procedures / Treatments   Labs (all labs ordered are listed, but only abnormal results are displayed) Labs Reviewed  CBC WITH DIFFERENTIAL/PLATELET - Abnormal; Notable for the following components:      Result Value   Hemoglobin 11.6 (*)    RDW 20.1 (*)    All other components within normal limits  COMPREHENSIVE METABOLIC PANEL - Abnormal; Notable for the following components:   Glucose, Bld 107 (*)    AST 55 (*)    All other components within normal limits  URINALYSIS, ROUTINE W REFLEX MICROSCOPIC - Abnormal; Notable for the following components:   Color, Urine AMBER (*)    APPearance CLOUDY (*)    Bilirubin Urine SMALL (*)    Ketones, ur 5 (*)    Protein, ur 30 (*)    Leukocytes,Ua TRACE (*)    Bacteria, UA FEW (*)    All other components within normal limits  URINE CULTURE  LIPASE, BLOOD    EKG None  Radiology No results found.  Procedures Procedures (including critical care time)  Medications Ordered in ED Medications - No data to display  ED Course  I have reviewed the triage vital signs and the nursing notes.  Pertinent labs & imaging  results that were available during my care of the patient were reviewed by me and considered in my medical decision making (see chart for details).    MDM Rules/Calculators/A&P                          Patient has multiple complaints, but her primary concern which prompted her to come to the ED for evaluation is ongoing 2+ week history of right-sided flank and right lower abdominal pain.  She does endorse pain radiating from/to her pelvis, as well.  Unclear as to whether or not this is abdominal or pelvic in etiology.  Her physical exam is largely reassuring, however she does  endorse tenderness most notably over right side of abdomen and suprapubic region.  Given her urinary difficulty and increased hesitance, will obtain UA and culture in addition to basic laboratory work-up.  Labs CBC with differential: Mild anemia with hemoglobin 11.6, consistent with baseline. No leukocytosis. CMP: Mild elevation of AST to 55. 2:1 ratio with ALT. Admits to alcohol use. Lipase: 21. Urine culture: Pending. UA: Few bacteria and 6-10 WBC. Cloudy. Negative nitrite.  While her UA is rather unimpressive, there are still WBC and bacteria noted. She is having urinary symptoms and is endorsing suprapubic region pain and was tender on my exam. She states that this feels like UTI. I feel as though it is reasonable to treat with Keflex x5 days. We will also provide patient given her intermittent spasms.  Despite penicillin allergy, she says that she has had keflex in the past with good effect.  Discussed CT for further evaluation and patient declines. She states that she has had multiple recent imaging and is instead reassured by today's work-up. There is no leukocytosis and her vital signs are stable and within normal limits. No documented fevers. I also offered a pelvic exam, but she declines. She denies any recent vaginal bleeding or discharge.  Patient is eating and drinking without difficulty.    This patient presents with abdominal pain of unclear etiology. Their evaluation has not identified a emergent etiology for the abdominal pain. Specifically, given the very benign exam, normal laboratory studies, and lack of significant risk factors, I have a very low suspicion for appendicitis, ischemic bowel, bowel perforation, or any other life threatening disease. I have discussed with the patient the level of uncertainty with undifferentiated abdominal pain and clearly explained the need to follow-up as noted on the discharge instructions, or return to the Emergency Department immediately if the  pain worsens, develops fever, persistent and uncontrollable vomiting, or for any new symptoms or concerns. I discussed with the patient that this presentation today for abdominal pain could represent a significant risk for an acute abdominal process. Although the tests in the ED were essentially normal, there is still a possibility of a process such as appendicitis, diverticulitis, cholecystitis, ulcer, early bowel obstruction, mesenteric ischemia, kidney stone, or even kidney infection which could subsequently cause disability or death. The patient understands that they must return within 24 hours for a recheck or see their physician within 24 hours for re-exam due to the possibility of significant surgical or medical process.  ED return precautions discussed.  Patient voices understanding and is agreeable to the plan.    Final Clinical Impression(s) / ED Diagnoses Final diagnoses:  Acute cystitis with hematuria    Rx / DC Orders ED Discharge Orders         Ordered    cephALEXin (KEFLEX)  500 MG capsule  3 times daily        12/30/19 1435    phenazopyridine (PYRIDIUM) 200 MG tablet  3 times daily PRN        12/30/19 1435    naproxen (NAPROSYN) 500 MG tablet  2 times daily PRN        12/30/19 1435           Reita Chard 12/30/19 1437    Daleen Bo, MD 01/02/20 [phone]

## 2019-12-30 NOTE — Discharge Instructions (Addendum)
Please take your medications, as directed.  The Keflex is for your suspected urinary tract infection.  The Pyridium will also help with the bladder spasms.  I would like you to take the naproxen for your pain symptoms.  Do not combine with other NSAIDs such as ibuprofen.  Continue to also supplement with Tylenol.  Additionally, I suspect that you are mildly dehydrated.  Please increase your oral hydration.  Your laboratory work-up and exam was reassuring.  However, we elected not to proceed with CT imaging.  While I have low suspicion for emergent pathology, if you develop any new or worsening symptoms, please return to the ED for further evaluation.  Otherwise, please follow-up with your primary care provider regarding today's encounter and for ongoing management.

## 2019-12-31 ENCOUNTER — Ambulatory Visit: Payer: Self-pay | Admitting: General Surgery

## 2019-12-31 LAB — URINE CULTURE: Culture: <10000 — AB

## 2020-02-03 ENCOUNTER — Telehealth (INDEPENDENT_AMBULATORY_CARE_PROVIDER_SITE_OTHER): Payer: Self-pay

## 2020-02-03 NOTE — Telephone Encounter (Signed)
Patient is having issues with vertigo. states at last [facility] PCP diagnosed her. She is requesting to have a medication sent in for vertigo and dizziness. Please send if appropriate. Nat Christen, CMA     Copied from [place] [phone]. Topic: General - Other >> Jan 26, 2020 10:50 AM Yvette Rack wrote: Reason for CRM: Pt requests to speak with Elai Vanwyk. Pt requests call back. Cb# [id number]

## 2020-02-08 NOTE — Telephone Encounter (Signed)
What dosage is patient decreasing antihypertensive to?

## 2020-02-08 NOTE — Telephone Encounter (Signed)
Patient contributed headaches and dizziness to chlorthalidone 25 mg daily.Discontinue chlorthalidone and start hydrochlorothiazide 25 mg daily.  Dizziness continues needs appt

## 2020-02-10 ENCOUNTER — Encounter (HOSPITAL_COMMUNITY): Payer: Self-pay

## 2020-02-10 ENCOUNTER — Emergency Department (HOSPITAL_COMMUNITY)
Admission: EM | Admit: 2020-02-10 | Discharge: 2020-02-10 | Disposition: A | Discharge status: Home / Self Care | Payer: Self-pay | Attending: Emergency Medicine | Admitting: Emergency Medicine

## 2020-02-10 ENCOUNTER — Encounter (HOSPITAL_COMMUNITY): Payer: Self-pay | Admitting: *Deleted

## 2020-02-10 ENCOUNTER — Other Ambulatory Visit: Payer: Self-pay

## 2020-02-10 DIAGNOSIS — Z79899 Other long term (current) drug therapy: Secondary | ICD-10-CM | POA: Insufficient documentation

## 2020-02-10 DIAGNOSIS — R1010 Upper abdominal pain, unspecified: Secondary | ICD-10-CM | POA: Insufficient documentation

## 2020-02-10 DIAGNOSIS — I129 Hypertensive chronic kidney disease with stage 1 through stage 4 chronic kidney disease, or unspecified chronic kidney disease: Secondary | ICD-10-CM | POA: Insufficient documentation

## 2020-02-10 DIAGNOSIS — R112 Nausea with vomiting, unspecified: Secondary | ICD-10-CM | POA: Insufficient documentation

## 2020-02-10 DIAGNOSIS — N189 Chronic kidney disease, unspecified: Secondary | ICD-10-CM | POA: Insufficient documentation

## 2020-02-10 DIAGNOSIS — J45909 Unspecified asthma, uncomplicated: Secondary | ICD-10-CM | POA: Insufficient documentation

## 2020-02-10 DIAGNOSIS — Z91040 Latex allergy status: Secondary | ICD-10-CM | POA: Insufficient documentation

## 2020-02-10 DIAGNOSIS — R1013 Epigastric pain: Secondary | ICD-10-CM | POA: Insufficient documentation

## 2020-02-10 DIAGNOSIS — Z5321 Procedure and treatment not carried out due to patient leaving prior to being seen by health care provider: Secondary | ICD-10-CM | POA: Insufficient documentation

## 2020-02-10 DIAGNOSIS — F17210 Nicotine dependence, cigarettes, uncomplicated: Secondary | ICD-10-CM | POA: Insufficient documentation

## 2020-02-10 DIAGNOSIS — K219 Gastro-esophageal reflux disease without esophagitis: Secondary | ICD-10-CM | POA: Insufficient documentation

## 2020-02-10 DIAGNOSIS — R110 Nausea: Secondary | ICD-10-CM | POA: Insufficient documentation

## 2020-02-10 LAB — COMPREHENSIVE METABOLIC PANEL
ALT: 52 U/L — ABNORMAL HIGH (ref 0–44)
AST: 81 U/L — ABNORMAL HIGH (ref 15–41)
Albumin: 3.8 g/dL (ref 3.5–5.0)
Alkaline Phosphatase: 85 U/L (ref 38–126)
Anion gap: 13 (ref 5–15)
BUN: 8 mg/dL (ref 6–20)
CO2: 24 mmol/L (ref 22–32)
Calcium: 9.9 mg/dL (ref 8.9–10.3)
Chloride: 100 mmol/L (ref 98–111)
Creatinine, Ser: 0.62 mg/dL (ref 0.44–1.00)
GFR, Estimated: >60 mL/min (ref >60)
Glucose, Bld: 101 mg/dL — ABNORMAL HIGH (ref 70–99)
Potassium: 3.5 mmol/L (ref 3.5–5.1)
Sodium: 137 mmol/L (ref 135–145)
Total Bilirubin: 1.3 mg/dL — ABNORMAL HIGH (ref 0.3–1.2)
Total Protein: 7.2 g/dL (ref 6.5–8.1)

## 2020-02-10 LAB — CBC
HCT: 37.1 % (ref 36.0–46.0)
Hemoglobin: 12.2 g/dL (ref 12.0–15.0)
MCH: 27.8 pg (ref 26.0–34.0)
MCHC: 32.9 g/dL (ref 30.0–36.0)
MCV: 84.5 fL (ref 80.0–100.0)
Platelets: 420 10*3/uL — ABNORMAL HIGH (ref 150–400)
RBC: 4.39 MIL/uL (ref 3.87–5.11)
RDW: 20.4 % — ABNORMAL HIGH (ref 11.5–15.5)
WBC: 6.4 10*3/uL (ref 4.0–10.5)
nRBC: 0 % (ref 0.0–0.2)

## 2020-02-10 LAB — I-STAT BETA HCG BLOOD, ED (MC, WL, AP ONLY): I-stat hCG, quantitative: <5 m[IU]/mL (ref <5)

## 2020-02-10 LAB — LIPASE, BLOOD: Lipase: 52 U/L — ABNORMAL HIGH (ref 11–51)

## 2020-02-10 MED ORDER — MORPHINE SULFATE (PF) 4 MG/ML IV SOLN
4.0000 mg | Freq: Once | INTRAVENOUS | Status: AC
  Administered 2020-02-10: 4 mg via INTRAVENOUS
  Filled 2020-02-10: qty 1

## 2020-02-10 MED ORDER — DIPHENHYDRAMINE HCL 50 MG/ML IJ SOLN
25.0000 mg | Freq: Once | INTRAMUSCULAR | Status: AC
  Administered 2020-02-10: 25 mg via INTRAVENOUS
  Filled 2020-02-10: qty 1

## 2020-02-10 MED ORDER — PROCHLORPERAZINE EDISYLATE 10 MG/2ML IJ SOLN
10.0000 mg | Freq: Once | INTRAMUSCULAR | Status: AC
  Administered 2020-02-10: 10 mg via INTRAVENOUS
  Filled 2020-02-10: qty 2

## 2020-02-10 MED ORDER — ALUM & MAG HYDROXIDE-SIMETH 200-200-20 MG/5ML PO SUSP
30.0000 mL | Freq: Once | ORAL | Status: AC
  Administered 2020-02-10: 30 mL via ORAL
  Filled 2020-02-10: qty 30

## 2020-02-10 MED ORDER — SODIUM CHLORIDE 0.9 % IV BOLUS
1000.0000 mL | Freq: Once | INTRAVENOUS | Status: AC
  Administered 2020-02-10: 1000 mL via INTRAVENOUS

## 2020-02-10 NOTE — ED Triage Notes (Signed)
Patient arrives with complaints of upper abdominal pain and NV that started about a week ago. Reports taking ibuprofen at about midnight.

## 2020-02-10 NOTE — Discharge Instructions (Signed)
Continue to take your pepcid.   Try to avoid things that may make this worse, most commonly these are spicy foods tomato based products fatty foods chocolate and peppermint.  Alcohol and tobacco can also make this worse.  Return to the emergency department for sudden worsening pain fever or inability to eat or drink.

## 2020-02-10 NOTE — ED Triage Notes (Signed)
Abdominal pain that feels like pancreatitis. Pain in the upper abdomen, radiates around to the right sided. Has not had ETOH in 3 days. Nausea and vomiting

## 2020-02-10 NOTE — ED Provider Notes (Signed)
[facility] DEPT Provider Note   CSN: [id number] Arrival date & time: 02/10/20  0544     History Chief Complaint  Patient presents with  . Abdominal Pain    [name] is a 49 y.o. female.  49 yo F with a cc of epigastric abdominal pain.  This has been an off-and-on issue for her.  Going on for the past week.  Having some nausea.  Typically is worse with eating and alcohol.  She denies fevers.  Denies diarrhea.  No sick contacts.  No cough or congestion.  The history is provided by the patient.  Abdominal Pain Pain location:  Epigastric Pain quality: aching   Pain radiates to:  Back Pain severity:  Moderate Onset quality:  Gradual Duration:  1 week Timing:  Constant Progression:  Worsening Chronicity:  New Relieved by:  Nothing Worsened by:  Eating Ineffective treatments:  None tried Associated symptoms: nausea   Associated symptoms: no chest pain, no chills, no dysuria, no fever, no shortness of breath and no vomiting        Past Medical History:  Diagnosis Date  . Alcoholism ([place])   . Allergy    seasonal  . Anxiety   . Arthritis   . Asthma   . Blood transfusion    1989 at [facility]  . Bronchitis   . Chronic bipolar disorder ([place])   . Chronic kidney disease   . Depression    bipolar  . GERD (gastroesophageal reflux disease)   . Headache(784.0)    occasional  . Hypertension   . Mental disorder    bipolar  . MRSA infection 2011   left lower leg  . Pancreatitis   . Pyelonephritis 10/2010  . Recurrent upper respiratory infection (URI)    states she has not been to MD and feels like she has  bronchitis now- greenish sputum  . Shortness of breath    sometimes with exertion  . Sickle cell anemia (HCC)    sickle cell trait   . Sickle cell trait ([place])   . Sleep apnea    CPAP  . Substance abuse ([place])    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  . UTI (lower urinary tract infection) 10/2010    Patient Active Problem List   Diagnosis  Date Noted  . Essential hypertension, benign 08/28/2018  . Seasonal allergies 08/28/2018  . Herpes simplex infection 04/07/2018  . Other tear of medial meniscus, current injury, left knee, subsequent encounter 03/27/2018  . Fracture of tibial plateau, closed 03/08/2011  . GERD (gastroesophageal reflux disease) 03/08/2011  . Bipolar disorder ([place]) 03/08/2011  . OSA (obstructive sleep apnea) 03/08/2011  . Obesity 03/08/2011  . Nicotine dependence 03/08/2011    Past Surgical History:  Procedure Laterality Date  . CESAREAN SECTION    . KNEE ARTHROSCOPY  06/15/2011   Procedure: ARTHROSCOPY KNEE;  Surgeon: Rozanna Box, MD;  Location: [facility];  Service: Orthopedics;  Laterality: Left;  LEFT KNEE SCOPE WITH LYSIS OF ADHESIONS AND MANIPULATION  . KNEE ARTHROSCOPY WITH MEDIAL MENISECTOMY Left 03/27/2018   Procedure: LEFT KNEE ARTHROSCOPY WITH PARTIAL MEDIAL MENISCECTOMY;  Surgeon: Mcarthur Rossetti, MD;  Location: [facility];  Service: Orthopedics;  Laterality: Left;  . ORIF TIBIA PLATEAU  03/08/2011   Procedure: OPEN REDUCTION INTERNAL FIXATION (ORIF) TIBIAL PLATEAU;  Surgeon: Rozanna Box;  Location: [facility];  Service: Orthopedics;  Laterality: Left;  . TUBAL LIGATION       OB History  Gravida  8   Para  4   Term      Preterm      AB  4   Living  4     SAB  2   TAB  2   Ectopic      Multiple      Live Births              Family History  Problem Relation Age of Onset  . Thyroid disease Father   . Colon cancer Father   . Cystic fibrosis Sister   . Heart disease Brother   . Esophageal cancer Paternal Grandmother   . Anesthesia problems Neg Hx   . Breast cancer Neg Hx   . Colon polyps Neg Hx   . Rectal cancer Neg Hx   . Stomach cancer Neg Hx     Social History   Tobacco Use  . Smoking status: Current Every Day Smoker    Packs/day: 0.50    Years: 24.00    Pack years: 12.00    Types: Cigarettes  . Smokeless tobacco: Never Used   Vaping Use  . Vaping Use: Never used  Substance Use Topics  . Alcohol use: Yes    Comment: Rare  . Drug use: Not Currently    Types: "Crack" cocaine    Comment: none since 2000    Home Medications Prior to Admission medications   Medication Sig Start Date End Date Taking? Authorizing Provider  amLODipine (NORVASC) 10 MG tablet Take 1 tablet daily 06/10/19   Kerin Perna, NP  famotidine (PEPCID) 20 MG tablet Take 1 tablet (20 mg total) by mouth 2 (two) times daily. 08/25/19   Irene Shipper, MD  hydrochlorothiazide (HYDRODIURIL) 25 MG tablet Take 1 tablet (25 mg total) by mouth daily. 12/09/19   Kerin Perna, NP  metroNIDAZOLE (FLAGYL) 500 MG tablet Take 1 tablet (500 mg total) by mouth 2 (two) times daily. 12/13/19   Kerin Perna, NP  naproxen (NAPROSYN) 500 MG tablet Take 1 tablet (500 mg total) by mouth 2 (two) times daily as needed for moderate pain. 12/30/19   Corena Herter, PA-C  phenazopyridine (PYRIDIUM) 200 MG tablet Take 1 tablet (200 mg total) by mouth 3 (three) times daily as needed for pain. 12/30/19   Corena Herter, PA-C  sucralfate (CARAFATE) 1 g tablet Take 1 tablet (1 g total) by mouth 4 (four) times daily -  with meals and at bedtime. Dissolve tablet in 1 tablespoon of warm water and swallow Patient not taking: Reported on 12/09/2019 08/27/19   Thornton Park, MD    Allergies    Aspirin, Banana, Latex, Penicillins, Septra [bactrim], Shellfish allergy, Strawberry extract, and Tape  Review of Systems   Review of Systems  Constitutional: Negative for chills and fever.  HENT: Negative for congestion and rhinorrhea.   Eyes: Negative for redness and visual disturbance.  Respiratory: Negative for shortness of breath and wheezing.   Cardiovascular: Negative for chest pain and palpitations.  Gastrointestinal: Positive for abdominal pain and nausea. Negative for vomiting.  Genitourinary: Negative for dysuria and urgency.  Musculoskeletal: Negative  for arthralgias and myalgias.  Skin: Negative for pallor and wound.  Neurological: Negative for dizziness and headaches.    Physical Exam Updated Vital Signs BP (!) 167/102 (BP Location: Left Arm)   Pulse 89   Temp 98.3 F (36.8 C) (Oral)   Resp 16   LMP 01/27/2020   SpO2 100%   Physical Exam Vitals  and nursing note reviewed.  Constitutional:      General: She is not in acute distress.    Appearance: She is well-developed. She is not diaphoretic.  HENT:     Head: Normocephalic and atraumatic.  Eyes:     Pupils: Pupils are equal, round, and reactive to light.  Cardiovascular:     Rate and Rhythm: Normal rate and regular rhythm.     Heart sounds: No murmur heard.  No friction rub. No gallop.   Pulmonary:     Effort: Pulmonary effort is normal.     Breath sounds: No wheezing or rales.  Abdominal:     General: There is no distension.     Palpations: Abdomen is soft.     Tenderness: There is abdominal tenderness in the epigastric area.     Comments: Mild diffuse tenderness worst to the epigastrium  Musculoskeletal:        General: No tenderness.     Cervical back: Normal range of motion and neck supple.  Skin:    General: Skin is warm and dry.  Neurological:     Mental Status: She is alert and oriented to person, place, and time.  Psychiatric:        Behavior: Behavior normal.     ED Results / Procedures / Treatments   Labs (all labs ordered are listed, but only abnormal results are displayed) Labs Reviewed - No data to display  EKG None  Radiology No results found.  Procedures Procedures (including critical care time)  Medications Ordered in ED Medications  sodium chloride 0.9 % bolus 1,000 mL (1,000 mLs Intravenous New Bag/Given 02/10/20 0636)  prochlorperazine (COMPAZINE) injection 10 mg (10 mg Intravenous Given 02/10/20 0640)  diphenhydrAMINE (BENADRYL) injection 25 mg (25 mg Intravenous Given 02/10/20 0639)  morphine 4 MG/ML injection 4 mg (4 mg  Intravenous Given 02/10/20 0641)  alum & mag hydroxide-simeth (MAALOX/MYLANTA) 200-200-20 MG/5ML suspension 30 mL (30 mLs Oral Given 02/10/20 5056)    ED Course  I have reviewed the triage vital signs and the nursing notes.  Pertinent labs & imaging results that were available during my care of the patient were reviewed by me and considered in my medical decision making (see chart for details).    MDM Rules/Calculators/A&P                          49 yo F with a chief complaint of epigastric abdominal pain that radiates to the back.  Feels like her chronic pancreatitis.  Patient was seen at [facility] and then left prior to being seen and came to the ED here.  Lab work had resulted by the time I went to go see the patient, no LFT elevation lipase in the 50s.  Seems unlikely to be acute pancreatitis.  She is well-appearing nontoxic.  Tolerating by mouth.  She is requesting a bolus of IV fluids.  Will treat pain and nausea here.  Patient feeling better on reassessment.  Tolerating PO.  D/c home GI follow up.   7:19 AM:  I have discussed the diagnosis/risks/treatment options with the patient and believe the pt to be eligible for discharge home to follow-up with GI. We also discussed returning to the ED immediately if new or worsening sx occur. We discussed the sx which are most concerning (e.g., sudden worsening pain, fever, inability to tolerate by mouth) that necessitate immediate return. Medications administered to the patient during their visit and any new prescriptions  provided to the patient are listed below.  Medications given during this visit Medications  sodium chloride 0.9 % bolus 1,000 mL (1,000 mLs Intravenous New Bag/Given 02/10/20 0636)  prochlorperazine (COMPAZINE) injection 10 mg (10 mg Intravenous Given 02/10/20 0640)  diphenhydrAMINE (BENADRYL) injection 25 mg (25 mg Intravenous Given 02/10/20 0639)  morphine 4 MG/ML injection 4 mg (4 mg Intravenous Given 02/10/20 0641)  alum & mag  hydroxide-simeth (MAALOX/MYLANTA) 200-200-20 MG/5ML suspension 30 mL (30 mLs Oral Given 02/10/20 2224)     The patient appears reasonably screen and/or stabilized for discharge and I doubt any other medical condition or other [facility] requiring further screening, evaluation, or treatment in the ED at this time prior to discharge.   Final Clinical Impression(s) / ED Diagnoses Final diagnoses:  Epigastric abdominal pain    Rx / DC Orders ED Discharge Orders    None       Deno Etienne, DO 02/10/20 0719

## 2020-02-11 NOTE — Progress Notes (Signed)
Pt scheduled to see Dr. Tarri Glenn 02/18/20@1 :30pm. Spoke with pt and she is aware of appt.

## 2020-02-11 NOTE — Telephone Encounter (Signed)
Patient has scheduled appointment to discuss dizziness with PCP on 02/22/20.

## 2020-02-11 NOTE — Progress Notes (Deleted)
Pt scheduled to see Dr. Tarri Glenn 02/18/20@1 :30pm. Spoke with pt and she is aware of appt.

## 2020-02-16 ENCOUNTER — Ambulatory Visit: Payer: Self-pay | Admitting: General Surgery

## 2020-02-17 ENCOUNTER — Emergency Department (HOSPITAL_COMMUNITY)
Admission: EM | Admit: 2020-02-17 | Discharge: 2020-02-17 | Disposition: A | Discharge status: Home / Self Care | Payer: Self-pay | Attending: Emergency Medicine | Admitting: Emergency Medicine

## 2020-02-17 DIAGNOSIS — Z5321 Procedure and treatment not carried out due to patient leaving prior to being seen by health care provider: Secondary | ICD-10-CM | POA: Insufficient documentation

## 2020-02-17 DIAGNOSIS — W19XXXD Unspecified fall, subsequent encounter: Secondary | ICD-10-CM | POA: Insufficient documentation

## 2020-02-17 DIAGNOSIS — Z0489 Encounter for examination and observation for other specified reasons: Secondary | ICD-10-CM | POA: Insufficient documentation

## 2020-02-17 NOTE — ED Notes (Signed)
Patient decided to leave couldn't sit any longer patient stated that she will return if it gets worst

## 2020-02-18 ENCOUNTER — Ambulatory Visit: Payer: Self-pay | Admitting: Gastroenterology

## 2020-02-22 ENCOUNTER — Ambulatory Visit (INDEPENDENT_AMBULATORY_CARE_PROVIDER_SITE_OTHER): Payer: Self-pay | Admitting: Primary Care

## 2020-02-24 ENCOUNTER — Ambulatory Visit (INDEPENDENT_AMBULATORY_CARE_PROVIDER_SITE_OTHER): Payer: Self-pay | Admitting: Orthopaedic Surgery

## 2020-02-24 ENCOUNTER — Other Ambulatory Visit: Payer: Self-pay

## 2020-02-24 ENCOUNTER — Ambulatory Visit (INDEPENDENT_AMBULATORY_CARE_PROVIDER_SITE_OTHER): Payer: Self-pay

## 2020-02-24 ENCOUNTER — Ambulatory Visit: Payer: Self-pay

## 2020-02-24 DIAGNOSIS — G8929 Other chronic pain: Secondary | ICD-10-CM

## 2020-02-24 DIAGNOSIS — M25561 Pain in right knee: Secondary | ICD-10-CM

## 2020-02-24 DIAGNOSIS — M545 Low back pain, unspecified: Secondary | ICD-10-CM

## 2020-02-24 DIAGNOSIS — M25562 Pain in left knee: Secondary | ICD-10-CM

## 2020-02-24 DIAGNOSIS — M4807 Spinal stenosis, lumbosacral region: Secondary | ICD-10-CM

## 2020-02-24 MED ORDER — TIZANIDINE HCL 4 MG PO TABS: 4.0000 mg | ORAL_TABLET | Freq: Three times a day (TID) | ORAL | 1 refills | Status: DC | PRN

## 2020-02-24 MED ORDER — METHYLPREDNISOLONE 4 MG PO TABS: ORAL_TABLET | ORAL | 0 refills | Status: DC

## 2020-02-24 MED ORDER — GABAPENTIN 300 MG PO CAPS: 300.0000 mg | ORAL_CAPSULE | Freq: Every day | ORAL | 1 refills | Status: DC
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
## 2020-03-02 ENCOUNTER — Ambulatory Visit: Payer: Self-pay

## 2020-03-10 ENCOUNTER — Telehealth (INDEPENDENT_AMBULATORY_CARE_PROVIDER_SITE_OTHER): Payer: Self-pay | Admitting: Primary Care

## 2020-03-10 ENCOUNTER — Telehealth: Payer: Self-pay | Admitting: Orthopaedic Surgery

## 2020-03-10 NOTE — Telephone Encounter (Signed)
Called and left 1X vm for pt to call and set MRI Review appt with Dr. Magnus Ivan after 03/23/20

## 2020-03-15 ENCOUNTER — Ambulatory Visit: Payer: Self-pay | Admitting: General Surgery

## 2020-03-16 ENCOUNTER — Ambulatory Visit: Payer: Self-pay

## 2020-03-23 ENCOUNTER — Other Ambulatory Visit: Payer: Self-pay

## 2020-03-25 ENCOUNTER — Ambulatory Visit (INDEPENDENT_AMBULATORY_CARE_PROVIDER_SITE_OTHER): Payer: Self-pay | Admitting: *Deleted

## 2020-03-25 ENCOUNTER — Other Ambulatory Visit: Payer: Self-pay

## 2020-03-25 ENCOUNTER — Ambulatory Visit (INDEPENDENT_AMBULATORY_CARE_PROVIDER_SITE_OTHER): Payer: Self-pay | Admitting: Gastroenterology

## 2020-03-25 ENCOUNTER — Other Ambulatory Visit (INDEPENDENT_AMBULATORY_CARE_PROVIDER_SITE_OTHER): Payer: Self-pay

## 2020-03-25 ENCOUNTER — Encounter: Payer: Self-pay | Admitting: Gastroenterology

## 2020-03-25 VITALS — BP 144/86 | HR 73 | Ht 67.0 in | Wt 171.0 lb

## 2020-03-25 DIAGNOSIS — R748 Abnormal levels of other serum enzymes: Secondary | ICD-10-CM

## 2020-03-25 DIAGNOSIS — R1010 Upper abdominal pain, unspecified: Secondary | ICD-10-CM

## 2020-03-25 DIAGNOSIS — R7989 Other specified abnormal findings of blood chemistry: Secondary | ICD-10-CM

## 2020-03-25 DIAGNOSIS — D72820 Lymphocytosis (symptomatic): Secondary | ICD-10-CM

## 2020-03-25 LAB — BASIC METABOLIC PANEL
BUN: 7 mg/dL (ref 6–23)
CO2: 24 mEq/L (ref 19–32)
Calcium: 8.9 mg/dL (ref 8.4–10.5)
Chloride: 106 mEq/L (ref 96–112)
Creatinine, Ser: 0.63 mg/dL (ref 0.40–1.20)
GFR: 103.72 mL/min (ref >60.00)
Glucose, Bld: 76 mg/dL (ref 70–99)
Potassium: 3.7 mEq/L (ref 3.5–5.1)
Sodium: 141 mEq/L (ref 135–145)

## 2020-03-25 LAB — CBC WITH DIFFERENTIAL/PLATELET
Basophils Absolute: 0.1 10*3/uL (ref 0.0–0.1)
Basophils Relative: 2.6 % (ref 0.0–3.0)
Eosinophils Absolute: 0.1 10*3/uL (ref 0.0–0.7)
Eosinophils Relative: 1.4 % (ref 0.0–5.0)
HCT: 36.4 % (ref 36.0–46.0)
Hemoglobin: 11.8 g/dL — ABNORMAL LOW (ref 12.0–15.0)
Lymphocytes Relative: 55.9 % — ABNORMAL HIGH (ref 12.0–46.0)
Lymphs Abs: 2.7 10*3/uL (ref 0.7–4.0)
MCHC: 32.4 g/dL (ref 30.0–36.0)
MCV: 82.8 fl (ref 78.0–100.0)
Monocytes Absolute: 0.5 10*3/uL (ref 0.1–1.0)
Monocytes Relative: 9.3 % (ref 3.0–12.0)
Neutro Abs: 1.5 10*3/uL (ref 1.4–7.7)
Neutrophils Relative %: 30.8 % — ABNORMAL LOW (ref 43.0–77.0)
Platelets: 489 10*3/uL — ABNORMAL HIGH (ref 150.0–400.0)
RBC: 4.39 Mil/uL (ref 3.87–5.11)
RDW: 21.4 % — ABNORMAL HIGH (ref 11.5–15.5)
WBC: 4.9 10*3/uL (ref 4.0–10.5)

## 2020-03-25 LAB — HEPATIC FUNCTION PANEL
ALT: 32 U/L (ref 0–35)
AST: 66 U/L — ABNORMAL HIGH (ref 0–37)
Albumin: 4.3 g/dL (ref 3.5–5.2)
Alkaline Phosphatase: 92 U/L (ref 39–117)
Bilirubin, Direct: 0.1 mg/dL (ref 0.0–0.3)
Total Bilirubin: 0.4 mg/dL (ref 0.2–1.2)
Total Protein: 7.6 g/dL (ref 6.0–8.3)

## 2020-03-25 LAB — LIPASE: Lipase: 5 U/L — ABNORMAL LOW (ref 11.0–59.0)

## 2020-03-25 MED ORDER — AMITRIPTYLINE HCL 25 MG PO TABS: 25.0000 mg | ORAL_TABLET | Freq: Every day | ORAL | 2 refills | Status: DC

## 2020-03-25 MED FILL — AMITRIPTYLINE HCL 25 MG TAB: 25 | 30 days supply | Qty: 30 | Fill #0

## 2020-03-25 NOTE — Patient Instructions (Addendum)
PRESCRIPTION MEDICATION(S): We have sent the following medication(s) to your pharmacy:   Amitriptyline - please take 63m by mouth daily at bedtime   NOTE: If your medication(s) requires a PRIOR AUTHORIZATION, we will receive notification from your pharmacy. Once received, the process to submit for approval may take up to 7-10 business days. You will be contacted about any denials we have received from your insurance company as well as alternatives recommended by your provider.  LABS: Your provider has requested that you go to the basement level for lab work before leaving today. Press "B" on the elevator. The lab is located at the first door on the left as you exit the elevator.  HEALTHCARE LAWS AND MY CHART RESULTS: Due to recent changes in healthcare laws, you may see the results of your imaging and laboratory studies on MyChart before your provider has had a chance to review them.  We understand that in some cases there may be results that are confusing or concerning to you. Not all laboratory results come back in the same time frame and the provider may be waiting for multiple results in order to interpret others.  Please give [id number] hours in order for your provider to thoroughly review all the results before contacting the office for clarification of your results.   CT SCAN WITH PANCREATIC PROTOCOL:  You have been scheduled for a CT of the abdomen and pelvis [facility] on 04/01/20 at 7:30am. Please arrive @ 7:15am for registration.   PREP:    Do not eat anything after 3:30am (4 hours prior to your test)   MEDICATIONS:   You may take your medications as prescribed with a small amount of water, if necessary.   The following medications MAY need to be held 48 hours before your exam: METFORMIN, GLUCOPHAGE, [place] AVANDAMET, RIOMET, FORTAMET, ABrackenMET, JANUMET, GLUMETZA or METAGLIP. Please contact [facility] at [phone] the hours of 8:00 am and 5:00 pm, Monday-Friday to inquire further.   NEED TO RESCHEDULE? Please call radiology at [phone]  FOLLOW UP APPOINTMENT: Please follow up with PTye Savoy NP on 04/05/20 @ 11am to review the results of your CT.  If you are age 879or younger, your body mass index should be between 19-25. Your Body mass index is 26.78 kg/m. If this is out of the aformentioned range listed, please consider follow up with your Primary Care Provider.   Thank you for trusting me with your gastrointestinal care!    [place] Park MD, MPH

## 2020-03-25 NOTE — Telephone Encounter (Signed)
Advised UC for evaluation of dizziness and ear pain/pressure.  Reason for Disposition  [1] MODERATE dizziness (e.g., vertigo; feels very unsteady, interferes with normal activities) AND [2] has NOT been evaluated by physician for this  Answer Assessment - Initial Assessment Questions 1. DESCRIPTION: "Describe your dizziness."     Vertigo- pain in ears 2. VERTIGO: "Do you feel like either you or the room is spinning or tilting?"      yes 3. LIGHTHEADED: "Do you feel lightheaded?" (e.g., somewhat faint, woozy, weak upon standing)     Off balance 4. SEVERITY: "How bad is it?"  "Can you walk?"   - MILD: Feels unsteady but walking normally.   - MODERATE: Feels very unsteady when walking, but not falling; interferes with normal activities (e.g., school, work) .   - SEVERE: Unable to walk without falling, or requires assistance to walk without falling.     Mild/moderate 5. ONSET:  "When did the dizziness begin?"     Off/on for a while 2-3 months 6. AGGRAVATING FACTORS: "Does anything make it worse?" (e.g., standing, change in head position)     Standing- constant pain and ringing in ears 7. CAUSE: "What do you think is causing the dizziness?"    Hx vertigo 8. RECURRENT SYMPTOM: "Have you had dizziness before?" If Yes, ask: "When was the last time?" "What happened that time?"     Yes- medication for motion sickness- last year 9. OTHER SYMPTOMS: "Do you have any other symptoms?" (e.g., headache, weakness, numbness, vomiting, earache)     Ear pain 10. PREGNANCY: "Is there any chance you are pregnant?" "When was your last menstrual period?"       n/a  Protocols used: DIZZINESS - VERTIGO-A-AH
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
## 2020-03-28 ENCOUNTER — Ambulatory Visit: Payer: Self-pay | Admitting: Orthopaedic Surgery

## 2020-03-28 ENCOUNTER — Telehealth: Payer: Self-pay | Admitting: Hematology

## 2020-03-28 NOTE — Telephone Encounter (Signed)
Received a new hem referral from Dr. Tarri Glenn for elevated platelet count. [name] has been cld and scheduled to see Dr. Irene Limbo on 1/26 at 11am. Pt aware to arrive 20 minutes early.

## 2020-03-29 ENCOUNTER — Other Ambulatory Visit: Payer: Self-pay
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
## 2020-03-30 ENCOUNTER — Inpatient Hospital Stay: Payer: Self-pay | Admitting: Hematology

## 2020-03-30 ENCOUNTER — Telehealth: Payer: Self-pay | Admitting: Hematology

## 2020-03-30 NOTE — Telephone Encounter (Signed)
I received a vm from [name] to r/s her new hem appt w/Dr. Irene Limbo. She has been cld and rescheduled to 2/14 at 11am.

## 2020-04-01 ENCOUNTER — Ambulatory Visit (HOSPITAL_COMMUNITY): Admission: RE | Admit: 2020-04-01 | Payer: Self-pay | Source: Ambulatory Visit

## 2020-04-04 ENCOUNTER — Telehealth: Payer: Self-pay | Admitting: Gastroenterology

## 2020-04-04 NOTE — Telephone Encounter (Signed)
Pt states the elavil was making her shaky and she did not feel good taking it, she has stopped it. Pt given the phone number to call and reschedule the CT scan. She was instructed to call back to reschedule the OV once the CT is rescheduled.

## 2020-04-04 NOTE — Telephone Encounter (Signed)
Patient called to advise that she was feeling weak and dizzy when she started taking the Amitriptyline medication but has now finished them. She also wants to reschedule the MRI

## 2020-04-05 ENCOUNTER — Ambulatory Visit: Payer: Self-pay | Admitting: Nurse Practitioner

## 2020-04-11 ENCOUNTER — Ambulatory Visit (HOSPITAL_COMMUNITY): Admission: RE | Admit: 2020-04-11 | Payer: Self-pay | Source: Ambulatory Visit
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
## 2020-04-18 ENCOUNTER — Other Ambulatory Visit: Payer: Self-pay

## 2020-04-18 ENCOUNTER — Inpatient Hospital Stay: Payer: Self-pay

## 2020-04-18 ENCOUNTER — Inpatient Hospital Stay: Payer: Self-pay | Attending: Hematology | Admitting: Hematology

## 2020-04-18 VITALS — BP 188/107 | HR 74 | Temp 97.2°F | Resp 20 | Ht 67.0 in | Wt 172.8 lb

## 2020-04-18 DIAGNOSIS — R630 Anorexia: Secondary | ICD-10-CM | POA: Insufficient documentation

## 2020-04-18 DIAGNOSIS — I10 Essential (primary) hypertension: Secondary | ICD-10-CM | POA: Insufficient documentation

## 2020-04-18 DIAGNOSIS — R634 Abnormal weight loss: Secondary | ICD-10-CM | POA: Insufficient documentation

## 2020-04-18 DIAGNOSIS — F17210 Nicotine dependence, cigarettes, uncomplicated: Secondary | ICD-10-CM | POA: Insufficient documentation

## 2020-04-18 DIAGNOSIS — Z7289 Other problems related to lifestyle: Secondary | ICD-10-CM | POA: Insufficient documentation

## 2020-04-18 DIAGNOSIS — R112 Nausea with vomiting, unspecified: Secondary | ICD-10-CM | POA: Insufficient documentation

## 2020-04-18 DIAGNOSIS — D75839 Thrombocytosis, unspecified: Secondary | ICD-10-CM

## 2020-04-18 DIAGNOSIS — R519 Headache, unspecified: Secondary | ICD-10-CM | POA: Insufficient documentation

## 2020-04-18 DIAGNOSIS — Z800 Family history of malignant neoplasm of digestive organs: Secondary | ICD-10-CM | POA: Insufficient documentation

## 2020-04-18 LAB — CMP (CANCER CENTER ONLY)
ALT: 46 U/L — ABNORMAL HIGH (ref 0–44)
AST: 71 U/L — ABNORMAL HIGH (ref 15–41)
Albumin: 4.2 g/dL (ref 3.5–5.0)
Alkaline Phosphatase: 104 U/L (ref 38–126)
Anion gap: 10 (ref 5–15)
BUN: 5 mg/dL — ABNORMAL LOW (ref 6–20)
CO2: 24 mmol/L (ref 22–32)
Calcium: 9.9 mg/dL (ref 8.9–10.3)
Chloride: 104 mmol/L (ref 98–111)
Creatinine: 0.74 mg/dL (ref 0.44–1.00)
GFR, Estimated: >60 mL/min (ref >60)
Glucose, Bld: 101 mg/dL — ABNORMAL HIGH (ref 70–99)
Potassium: 4.1 mmol/L (ref 3.5–5.1)
Sodium: 138 mmol/L (ref 135–145)
Total Bilirubin: 1.3 mg/dL — ABNORMAL HIGH (ref 0.3–1.2)
Total Protein: 8.5 g/dL — ABNORMAL HIGH (ref 6.5–8.1)

## 2020-04-18 LAB — CBC WITH DIFFERENTIAL/PLATELET
Abs Immature Granulocytes: 0.01 10*3/uL (ref 0.00–0.07)
Basophils Absolute: 0.1 10*3/uL (ref 0.0–0.1)
Basophils Relative: 1 %
Eosinophils Absolute: 0.1 10*3/uL (ref 0.0–0.5)
Eosinophils Relative: 1 %
HCT: 38.4 % (ref 36.0–46.0)
Hemoglobin: 12.3 g/dL (ref 12.0–15.0)
Immature Granulocytes: 0 %
Lymphocytes Relative: 35 %
Lymphs Abs: 2.4 10*3/uL (ref 0.7–4.0)
MCH: 26.5 pg (ref 26.0–34.0)
MCHC: 32 g/dL (ref 30.0–36.0)
MCV: 82.6 fL (ref 80.0–100.0)
Monocytes Absolute: 0.5 10*3/uL (ref 0.1–1.0)
Monocytes Relative: 8 %
Neutro Abs: 3.8 10*3/uL (ref 1.7–7.7)
Neutrophils Relative %: 55 %
Platelets: 544 10*3/uL — ABNORMAL HIGH (ref 150–400)
RBC: 4.65 MIL/uL (ref 3.87–5.11)
RDW: 19.8 % — ABNORMAL HIGH (ref 11.5–15.5)
WBC: 6.8 10*3/uL (ref 4.0–10.5)
nRBC: 0 % (ref 0.0–0.2)

## 2020-04-18 LAB — IRON AND TIBC
Iron: 311 ug/dL — ABNORMAL HIGH (ref 41–142)
Saturation Ratios: 66 % — ABNORMAL HIGH (ref 21–57)
TIBC: 473 ug/dL — ABNORMAL HIGH (ref 236–444)
UIBC: 162 ug/dL (ref 120–384)

## 2020-04-18 LAB — FERRITIN: Ferritin: 20 ng/mL (ref 11–307)

## 2020-04-19 ENCOUNTER — Ambulatory Visit (HOSPITAL_COMMUNITY): Payer: Self-pay

## 2020-04-19 ENCOUNTER — Telehealth: Payer: Self-pay | Admitting: Hematology

## 2020-04-19 NOTE — Telephone Encounter (Signed)
Scheduled per 02/14 los, patient has been called and voicemail was left. 

## 2020-04-20 ENCOUNTER — Encounter: Payer: Self-pay | Admitting: *Deleted

## 2020-04-20 ENCOUNTER — Telehealth: Payer: Self-pay | Admitting: Gastroenterology

## 2020-04-20 NOTE — Telephone Encounter (Signed)
Noted  

## 2020-04-25 LAB — JAK2 (INCLUDING V617F AND EXON 12), MPL,& CALR-NEXT GEN SEQ
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
## 2020-05-03 ENCOUNTER — Inpatient Hospital Stay: Payer: Self-pay | Attending: Hematology | Admitting: Hematology

## 2020-05-03 DIAGNOSIS — D75839 Thrombocytosis, unspecified: Secondary | ICD-10-CM | POA: Insufficient documentation

## 2020-06-27 ENCOUNTER — Encounter (HOSPITAL_COMMUNITY): Payer: Self-pay

## 2020-06-27 ENCOUNTER — Emergency Department (HOSPITAL_COMMUNITY)
Admission: EM | Admit: 2020-06-27 | Discharge: 2020-06-27 | Disposition: A | Discharge status: Home / Self Care | Payer: Self-pay | Attending: Emergency Medicine | Admitting: Emergency Medicine

## 2020-06-27 ENCOUNTER — Other Ambulatory Visit: Payer: Self-pay

## 2020-06-27 DIAGNOSIS — K219 Gastro-esophageal reflux disease without esophagitis: Secondary | ICD-10-CM | POA: Insufficient documentation

## 2020-06-27 DIAGNOSIS — J45909 Unspecified asthma, uncomplicated: Secondary | ICD-10-CM | POA: Insufficient documentation

## 2020-06-27 DIAGNOSIS — Z79899 Other long term (current) drug therapy: Secondary | ICD-10-CM | POA: Insufficient documentation

## 2020-06-27 DIAGNOSIS — Z91040 Latex allergy status: Secondary | ICD-10-CM | POA: Insufficient documentation

## 2020-06-27 DIAGNOSIS — I129 Hypertensive chronic kidney disease with stage 1 through stage 4 chronic kidney disease, or unspecified chronic kidney disease: Secondary | ICD-10-CM | POA: Insufficient documentation

## 2020-06-27 DIAGNOSIS — R112 Nausea with vomiting, unspecified: Secondary | ICD-10-CM | POA: Insufficient documentation

## 2020-06-27 DIAGNOSIS — F17210 Nicotine dependence, cigarettes, uncomplicated: Secondary | ICD-10-CM | POA: Insufficient documentation

## 2020-06-27 DIAGNOSIS — R1013 Epigastric pain: Secondary | ICD-10-CM | POA: Insufficient documentation

## 2020-06-27 DIAGNOSIS — N189 Chronic kidney disease, unspecified: Secondary | ICD-10-CM | POA: Insufficient documentation

## 2020-06-27 LAB — URINALYSIS, ROUTINE W REFLEX MICROSCOPIC
Bilirubin Urine: NEGATIVE
Glucose, UA: NEGATIVE mg/dL
Hgb urine dipstick: NEGATIVE
Ketones, ur: NEGATIVE mg/dL
Leukocytes,Ua: NEGATIVE
Nitrite: NEGATIVE
Protein, ur: NEGATIVE mg/dL
Specific Gravity, Urine: 1.019 (ref 1.005–1.030)
pH: 6 (ref 5.0–8.0)

## 2020-06-27 LAB — COMPREHENSIVE METABOLIC PANEL
ALT: 29 U/L (ref 0–44)
AST: 50 U/L — ABNORMAL HIGH (ref 15–41)
Albumin: 4 g/dL (ref 3.5–5.0)
Alkaline Phosphatase: 74 U/L (ref 38–126)
Anion gap: 11 (ref 5–15)
BUN: 6 mg/dL (ref 6–20)
CO2: 27 mmol/L (ref 22–32)
Calcium: 9.6 mg/dL (ref 8.9–10.3)
Chloride: 100 mmol/L (ref 98–111)
Creatinine, Ser: 0.63 mg/dL (ref 0.44–1.00)
GFR, Estimated: >60 mL/min (ref >60)
Glucose, Bld: 122 mg/dL — ABNORMAL HIGH (ref 70–99)
Potassium: 3.8 mmol/L (ref 3.5–5.1)
Sodium: 138 mmol/L (ref 135–145)
Total Bilirubin: 0.5 mg/dL (ref 0.3–1.2)
Total Protein: 7.4 g/dL (ref 6.5–8.1)

## 2020-06-27 LAB — LIPASE, BLOOD: Lipase: 36 U/L (ref 11–51)

## 2020-06-27 LAB — CBC
HCT: 36.9 % (ref 36.0–46.0)
Hemoglobin: 12 g/dL (ref 12.0–15.0)
MCH: 28 pg (ref 26.0–34.0)
MCHC: 32.5 g/dL (ref 30.0–36.0)
MCV: 86.2 fL (ref 80.0–100.0)
Platelets: 347 10*3/uL (ref 150–400)
RBC: 4.28 MIL/uL (ref 3.87–5.11)
RDW: 19 % — ABNORMAL HIGH (ref 11.5–15.5)
WBC: 5.6 10*3/uL (ref 4.0–10.5)
nRBC: 0 % (ref 0.0–0.2)

## 2020-06-27 LAB — I-STAT BETA HCG BLOOD, ED (MC, WL, AP ONLY): I-stat hCG, quantitative: <5 m[IU]/mL (ref <5)

## 2020-06-27 MED ORDER — OXYCODONE HCL 5 MG PO TABS
5.0000 mg | ORAL_TABLET | Freq: Once | ORAL | Status: AC
Start: 2020-06-27 — End: 2020-06-27
  Administered 2020-06-27: 5 mg via ORAL
  Filled 2020-06-27: qty 1

## 2020-06-27 MED ORDER — MORPHINE SULFATE (PF) 4 MG/ML IV SOLN
4.0000 mg | Freq: Once | INTRAVENOUS | Status: AC
  Administered 2020-06-27: 4 mg via INTRAVENOUS
  Filled 2020-06-27: qty 1

## 2020-06-27 MED ORDER — SODIUM CHLORIDE 0.9 % IV BOLUS
1000.0000 mL | Freq: Once | INTRAVENOUS | Status: AC
  Administered 2020-06-27: 1000 mL via INTRAVENOUS

## 2020-06-27 MED ORDER — ALUM & MAG HYDROXIDE-SIMETH 200-200-20 MG/5ML PO SUSP
30.0000 mL | Freq: Once | ORAL | Status: AC
  Administered 2020-06-27: 30 mL via ORAL
  Filled 2020-06-27: qty 30

## 2020-06-27 MED ORDER — ONDANSETRON 4 MG PO TBDP
ORAL_TABLET | ORAL | 0 refills | Status: DC
  Filled 2020-06-27: qty 20, 3d supply, fill #0

## 2020-06-27 MED ORDER — ONDANSETRON HCL 4 MG/2ML IJ SOLN
4.0000 mg | Freq: Once | INTRAMUSCULAR | Status: AC
  Administered 2020-06-27: 4 mg via INTRAVENOUS
  Filled 2020-06-27: qty 2

## 2020-06-27 NOTE — Discharge Instructions (Signed)
Try pepcid or tagamet up to twice a day.  Try to avoid things that may make this worse, most commonly these are spicy foods tomato based products fatty foods chocolate and peppermint.  Alcohol and tobacco can also make this worse.  Return to the emergency department for sudden worsening pain fever or inability to eat or drink.  

## 2020-06-27 NOTE — ED Triage Notes (Signed)
Right flank pain radiating to abdomen with nausea x 1 week but got worse today. Pt states it feels like her pancreatitis.

## 2020-06-27 NOTE — ED Notes (Signed)
Blood draw unsuccessful 

## 2020-06-27 NOTE — ED Provider Notes (Signed)
[facility] EMERGENCY DEPARTMENT Provider Note   CSN: [id number] Arrival date & time: 06/27/20  3329     History Chief Complaint  Patient presents with  . Flank Pain  . Nausea    [name] is a 50 y.o. female.  49 yo F with a chief complaints of epigastric abdominal pain that radiates to the back.  Been going on for a few days now.  Feels like her prior history of pancreatitis.  Has been able to drink without issue but has been having trouble eating.  Having some vomiting.  No fevers.  She also has some right low back pain that radiates to the groin.  She thinks she may have a kidney infection.  Has had some dysuria off and on but not currently.  Denies vaginal bleeding or discharge.  She endorses that her history of pancreatitis was caused by alcoholism.  Denies any current alcohol intake.  The history is provided by the patient.  Flank Pain Associated symptoms include abdominal pain. Pertinent negatives include no chest pain, no headaches and no shortness of breath.  Abdominal Pain Pain location:  Epigastric Pain quality: aching and sharp   Pain radiates to:  Back Pain severity:  Moderate Onset quality:  Gradual Duration:  3 days Timing:  Constant Progression:  Worsening Chronicity:  Recurrent Relieved by:  Nothing Worsened by:  Nothing Ineffective treatments:  None tried Associated symptoms: nausea and vomiting   Associated symptoms: no chest pain, no chills, no dysuria, no fever and no shortness of breath        Past Medical History:  Diagnosis Date  . Alcoholism ([place])   . Allergy    seasonal  . Anxiety   . Arthritis   . Asthma   . Blood transfusion    1989 at [place]  . Bronchitis   . Chronic bipolar disorder ([place])   . Chronic kidney disease   . Depression    bipolar  . GERD (gastroesophageal reflux disease)   . Headache(784.0)    occasional  . Hypertension   . Mental disorder    bipolar  . MRSA infection 2011   left  lower leg  . Pancreatitis   . Pyelonephritis 10/2010  . Recurrent upper respiratory infection (URI)    states she has not been to MD and feels like she has  bronchitis now- greenish sputum  . Shortness of breath    sometimes with exertion  . Sickle cell anemia (HCC)    sickle cell trait   . Sickle cell trait ([place])   . Sleep apnea    CPAP  . Substance abuse ([place])    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  . UTI (lower urinary tract infection) 10/2010    Patient Active Problem List   Diagnosis Date Noted  . Essential hypertension, benign 08/28/2018  . Seasonal allergies 08/28/2018  . Herpes simplex infection 04/07/2018  . Other tear of medial meniscus, current injury, left knee, subsequent encounter 03/27/2018  . Fracture of tibial plateau, closed 03/08/2011  . GERD (gastroesophageal reflux disease) 03/08/2011  . Bipolar disorder ([place]) 03/08/2011  . OSA (obstructive sleep apnea) 03/08/2011  . Obesity 03/08/2011  . Nicotine dependence 03/08/2011    Past Surgical History:  Procedure Laterality Date  . CESAREAN SECTION    . KNEE ARTHROSCOPY  06/15/2011   Procedure: ARTHROSCOPY KNEE;  Surgeon: Rozanna Box, MD;  Location: [place];  Service: Orthopedics;  Laterality: Left;  LEFT KNEE SCOPE WITH LYSIS OF ADHESIONS  AND MANIPULATION  . KNEE ARTHROSCOPY WITH MEDIAL MENISECTOMY Left 03/27/2018   Procedure: LEFT KNEE ARTHROSCOPY WITH PARTIAL MEDIAL MENISCECTOMY;  Surgeon: Mcarthur Rossetti, MD;  Location: [place];  Service: Orthopedics;  Laterality: Left;  . ORIF TIBIA PLATEAU  03/08/2011   Procedure: OPEN REDUCTION INTERNAL FIXATION (ORIF) TIBIAL PLATEAU;  Surgeon: Rozanna Box;  Location: [place];  Service: Orthopedics;  Laterality: Left;  . TUBAL LIGATION       OB History    Gravida  8   Para  4   Term      Preterm      AB  4   Living  4     SAB  2   IAB  2   Ectopic      Multiple      Live Births              Family History  Problem Relation Age of Onset   . Thyroid disease Father   . Colon cancer Father   . Cystic fibrosis Sister   . Heart disease Brother   . Esophageal cancer Paternal Grandmother   . Anesthesia problems Neg Hx   . Breast cancer Neg Hx   . Colon polyps Neg Hx   . Rectal cancer Neg Hx   . Stomach cancer Neg Hx     Social History   Tobacco Use  . Smoking status: Current Every Day Smoker    Packs/day: 0.50    Years: 24.00    Pack years: 12.00    Types: Cigarettes  . Smokeless tobacco: Never Used  Vaping Use  . Vaping Use: Never used  Substance Use Topics  . Alcohol use: Yes    Comment: Rare  . Drug use: Not Currently    Types: "Crack" cocaine    Comment: none since 2000    Home Medications Prior to Admission medications   Medication Sig Start Date End Date Taking? Authorizing Provider  ondansetron (ZOFRAN ODT) 4 MG disintegrating tablet 4mg  ODT q4 hours prn nausea/vomit 06/27/20  Yes Tyrone Nine, Chivas Notz, DO  amitriptyline (ELAVIL) 25 MG tablet TAKE 1 TABLET (25 MG TOTAL) BY MOUTH AT BEDTIME. 03/25/20 03/25/21  Thornton Park, MD  amLODipine (NORVASC) 10 MG tablet Take 1 tablet daily 06/10/19   Kerin Perna, NP  cephALEXin (KEFLEX) 500 MG capsule TAKE 1 CAPSULE (500 MG TOTAL) BY MOUTH 3 (THREE) TIMES DAILY FOR 5 DAYS. 12/30/19 12/29/20  Corena Herter, PA-C  famotidine (PEPCID) 20 MG tablet Take 1 tablet (20 mg total) by mouth 2 (two) times daily. 08/25/19   Irene Shipper, MD  gabapentin (NEURONTIN) 300 MG capsule Take 1 capsule (300 mg total) by mouth at bedtime. 02/24/20   Mcarthur Rossetti, MD  hydrochlorothiazide (HYDRODIURIL) 25 MG tablet Take 1 tablet (25 mg total) by mouth daily. 12/09/19   Kerin Perna, NP  methylPREDNISolone (MEDROL) 4 MG tablet Medrol dose pack. Take as instructed 02/24/20   Mcarthur Rossetti, MD  metroNIDAZOLE (FLAGYL) 500 MG tablet Take 1 tablet (500 mg total) by mouth 2 (two) times daily. 12/13/19   Kerin Perna, NP  naproxen (NAPROSYN) 500 MG tablet TAKE 1  TABLET (500 MG TOTAL) BY MOUTH 2 (TWO) TIMES DAILY AS NEEDED FOR MODERATE PAIN. 12/30/19 12/29/20  Corena Herter, PA-C  phenazopyridine (PYRIDIUM) 100 MG tablet TAKE 2 TABLETS (200 MG TOTAL) BY MOUTH 3 (THREE) TIMES DAILY AS NEEDED FOR PAIN. 12/30/19 12/29/20  Corena Herter, PA-C  phenazopyridine (PYRIDIUM) 200 MG tablet Take 1 tablet (200 mg total) by mouth 3 (three) times daily as needed for pain. 12/30/19   Corena Herter, PA-C  sucralfate (CARAFATE) 1 g tablet Take 1 tablet (1 g total) by mouth 4 (four) times daily -  with meals and at bedtime. Dissolve tablet in 1 tablespoon of warm water and swallow 08/27/19   Thornton Park, MD  tiZANidine (ZANAFLEX) 4 MG tablet Take 1 tablet (4 mg total) by mouth every 8 (eight) hours as needed for muscle spasms. 02/24/20   Mcarthur Rossetti, MD    Allergies    Aspirin, Banana, Latex, Penicillins, Septra [bactrim], Shellfish allergy, Strawberry extract, and Tape  Review of Systems   Review of Systems  Constitutional: Negative for chills and fever.  HENT: Negative for congestion and rhinorrhea.   Eyes: Negative for redness and visual disturbance.  Respiratory: Negative for shortness of breath and wheezing.   Cardiovascular: Negative for chest pain and palpitations.  Gastrointestinal: Positive for abdominal pain, nausea and vomiting.  Genitourinary: Positive for flank pain. Negative for dysuria and urgency.  Musculoskeletal: Negative for arthralgias and myalgias.  Skin: Negative for pallor and wound.  Neurological: Negative for dizziness and headaches.    Physical Exam Updated Vital Signs BP (!) 163/95 (BP Location: Left Arm)   Pulse 84   Temp 98.5 F (36.9 C) (Oral)   Resp 16   Ht 5\' 7"  (1.702 m)   Wt 78 kg   SpO2 100%   BMI 26.93 kg/m   Physical Exam Vitals and nursing note reviewed.  Constitutional:      General: She is not in acute distress.    Appearance: She is well-developed. She is not diaphoretic.  HENT:      Head: Normocephalic and atraumatic.  Eyes:     Pupils: Pupils are equal, round, and reactive to light.  Cardiovascular:     Rate and Rhythm: Normal rate and regular rhythm.     Heart sounds: No murmur heard. No friction rub. No gallop.   Pulmonary:     Effort: Pulmonary effort is normal.     Breath sounds: No wheezing or rales.  Abdominal:     General: There is no distension.     Palpations: Abdomen is soft.     Tenderness: There is abdominal tenderness.     Comments: Diffuse abdominal pain worse in the epigastrium.  Musculoskeletal:        General: No tenderness.     Cervical back: Normal range of motion and neck supple.  Skin:    General: Skin is warm and dry.  Neurological:     Mental Status: She is alert and oriented to person, place, and time.  Psychiatric:        Behavior: Behavior normal.     ED Results / Procedures / Treatments   Labs (all labs ordered are listed, but only abnormal results are displayed) Labs Reviewed  COMPREHENSIVE METABOLIC PANEL - Abnormal; Notable for the following components:      Result Value   Glucose, Bld 122 (*)    AST 50 (*)    All other components within normal limits  CBC - Abnormal; Notable for the following components:   RDW 19.0 (*)    All other components within normal limits  URINALYSIS, ROUTINE W REFLEX MICROSCOPIC - Abnormal; Notable for the following components:   Color, Urine AMBER (*)    APPearance HAZY (*)    All other components within normal limits  LIPASE, BLOOD  I-STAT BETA HCG BLOOD, ED ([facility], [facility], AP ONLY)    EKG None  Radiology No results found.  Procedures Procedures   Medications Ordered in ED Medications  morphine 4 MG/ML injection 4 mg (4 mg Intravenous Given 06/27/20 0752)  ondansetron (ZOFRAN) injection 4 mg (4 mg Intravenous Given 06/27/20 0751)  sodium chloride 0.9 % bolus 1,000 mL (0 mLs Intravenous Stopped 06/27/20 0841)  alum & mag hydroxide-simeth (MAALOX/MYLANTA) 200-200-20 MG/5ML suspension 30  mL (30 mLs Oral Given 06/27/20 0752)  oxyCODONE (Oxy IR/ROXICODONE) immediate release tablet 5 mg (5 mg Oral Given 06/27/20 0847)    ED Course  I have reviewed the triage vital signs and the nursing notes.  Pertinent labs & imaging results that were available during my care of the patient were reviewed by me and considered in my medical decision making (see chart for details).    MDM Rules/Calculators/A&P                          50 yo F with a chief complaints of abdominal pain.  This been going on for few days.  Feels like her prior history of pancreatitis.  Diffuse abdominal pain on my exam worse in the epigastrium.  Will obtain blood work treat pain and nausea.  Reassess.  Patient feeling better on reassessment.  Tolerating by mouth.  UA negative for infection or blood.  Will discharge the patient home.  GI follow-up.  9:31 AM:  I have discussed the diagnosis/risks/treatment options with the patient and believe the pt to be eligible for discharge home to follow-up with PCP, GI. We also discussed returning to the ED immediately if new or worsening sx occur. We discussed the sx which are most concerning (e.g., sudden worsening pain, fever, inability to tolerate by mouth) that necessitate immediate return. Medications administered to the patient during their visit and any new prescriptions provided to the patient are listed below.  Medications given during this visit Medications  morphine 4 MG/ML injection 4 mg (4 mg Intravenous Given 06/27/20 0752)  ondansetron (ZOFRAN) injection 4 mg (4 mg Intravenous Given 06/27/20 0751)  sodium chloride 0.9 % bolus 1,000 mL (0 mLs Intravenous Stopped 06/27/20 0841)  alum & mag hydroxide-simeth (MAALOX/MYLANTA) 200-200-20 MG/5ML suspension 30 mL (30 mLs Oral Given 06/27/20 0752)  oxyCODONE (Oxy IR/ROXICODONE) immediate release tablet 5 mg (5 mg Oral Given 06/27/20 0847)     The patient appears reasonably screen and/or stabilized for discharge and I doubt  any other medical condition or other [facility] requiring further screening, evaluation, or treatment in the ED at this time prior to discharge.   Final Clinical Impression(s) / ED Diagnoses Final diagnoses:  Epigastric abdominal pain    Rx / DC Orders ED Discharge Orders         Ordered    ondansetron (ZOFRAN ODT) 4 MG disintegrating tablet        06/27/20 [place], [place], DO 06/27/20 [phone]

## 2020-06-28 ENCOUNTER — Other Ambulatory Visit: Payer: Self-pay

## 2020-07-05 ENCOUNTER — Other Ambulatory Visit: Payer: Self-pay

## 2020-08-23 ENCOUNTER — Ambulatory Visit (INDEPENDENT_AMBULATORY_CARE_PROVIDER_SITE_OTHER): Payer: Self-pay | Admitting: *Deleted

## 2020-08-23 NOTE — Telephone Encounter (Signed)
Summary: dizzy and ear pain   Patient shares that they have been feeling light headed and dizzy for roughly three to four months   The patient experiences a dizzy sensation when they wake up as well as occasional nausea   The patient recently stopped consuming alcohol and is uncertain if this is a factor   The patient also has experienced a discomfort in their left ear   Please contact to further advise when possible      Reason for Disposition  [1] Dizziness caused by heat exposure, sudden standing, or poor fluid intake AND [2] no improvement after 2 hours of rest and fluids  Answer Assessment - Initial Assessment Questions 1. DESCRIPTION: "Describe your dizziness."     Dizzy and light lightheaded in am and then in afternoon 2. LIGHTHEADED: "Do you feel lightheaded?" (e.g., somewhat faint, woozy, weak upon standing)     Faint at times, left ear pain VERTIGO: "Do you feel like either you or the room is spinning or tilting?" (i.e. vertigo)   yes 4. SEVERITY: "How bad is it?"  "Do you feel like you are going to faint?" "Can you stand and walk?"   - MILD: Feels slightly dizzy, but walking normally.   - MODERATE: Feels unsteady when walking, but not falling; interferes with normal activities (e.g., school, work).   - SEVERE: Unable to walk without falling, or requires assistance to walk without falling; feels like passing out now.      Severe- when occurring, patient has vomiting and is having trouble eating 5. ONSET:  "When did the dizziness begin?"     7 months 6. AGGRAVATING FACTORS: "Does anything make it worse?" (e.g., standing, change in head position)     Nausea and vomiting, getting up in the morning 7. HEART RATE: "Can you tell me your heart rate?" "How many beats in 15 seconds?"  (Note: not all patients can do this)       Sometimes will feel flutter- or increase in rate 8. CAUSE: "What do you think is causing the dizziness?"     Unsure- patient has pancreatitis - is seeing  GI 9. RECURRENT SYMPTOM: "Have you had dizziness before?" If Yes, ask: "When was the last time?" "What happened that time?"     chronic 10. OTHER SYMPTOMS: "Do you have any other symptoms?" (e.g., fever, chest pain, vomiting, diarrhea, bleeding)       Hot/cold sweats, frequent diarrhea or constipation, stopped alcohol-3 months ago- but will drink beer/wine if she gets shakes 11. PREGNANCY: "Is there any chance you are pregnant?" "When was your last menstrual period?"       N/a  Protocols used: Dizziness - Lightheadedness-A-AH

## 2020-08-23 NOTE — Telephone Encounter (Signed)
Call to patient- patient is complaining of chronic illness with dizziness, nausea, no appetite- with weight loss. Patient advised per protocol- 4 hour disposition- but states her partner is working and she does not have ride. Patient made appointment to see PCP, advised of mobile unit option and advised to follow up at [facility] for increasing symptoms. Patient states she will.

## 2020-09-08 ENCOUNTER — Ambulatory Visit (INDEPENDENT_AMBULATORY_CARE_PROVIDER_SITE_OTHER): Payer: Self-pay | Admitting: Primary Care

## 2020-09-13 ENCOUNTER — Ambulatory Visit (HOSPITAL_COMMUNITY)
Admission: EM | Admit: 2020-09-13 | Discharge: 2020-09-13 | Disposition: A | Discharge status: Home / Self Care | Payer: No Payment, Other | Attending: Psychiatry | Admitting: Psychiatry

## 2020-09-13 ENCOUNTER — Other Ambulatory Visit: Payer: Self-pay

## 2020-09-13 DIAGNOSIS — F4310 Post-traumatic stress disorder, unspecified: Secondary | ICD-10-CM | POA: Insufficient documentation

## 2020-09-13 DIAGNOSIS — F1994 Other psychoactive substance use, unspecified with psychoactive substance-induced mood disorder: Secondary | ICD-10-CM

## 2020-09-13 DIAGNOSIS — N189 Chronic kidney disease, unspecified: Secondary | ICD-10-CM | POA: Insufficient documentation

## 2020-09-13 DIAGNOSIS — F1020 Alcohol dependence, uncomplicated: Secondary | ICD-10-CM

## 2020-09-13 DIAGNOSIS — Z62810 Personal history of physical and sexual abuse in childhood: Secondary | ICD-10-CM | POA: Insufficient documentation

## 2020-09-13 DIAGNOSIS — Z9151 Personal history of suicidal behavior: Secondary | ICD-10-CM | POA: Insufficient documentation

## 2020-09-13 DIAGNOSIS — F1029 Alcohol dependence with unspecified alcohol-induced disorder: Secondary | ICD-10-CM | POA: Insufficient documentation

## 2020-09-13 DIAGNOSIS — G4730 Sleep apnea, unspecified: Secondary | ICD-10-CM | POA: Insufficient documentation

## 2020-09-13 DIAGNOSIS — F17200 Nicotine dependence, unspecified, uncomplicated: Secondary | ICD-10-CM | POA: Insufficient documentation

## 2020-09-13 DIAGNOSIS — Z8719 Personal history of other diseases of the digestive system: Secondary | ICD-10-CM | POA: Insufficient documentation

## 2020-09-13 NOTE — ED Notes (Signed)
Pt discharged in no acute distress. Verbalized understanding of AVS instructions reviewed by staff. Pt left facility accompanied by her cousin for transport to [facility] expectation. Safety maintained.
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
## 2020-09-13 NOTE — Discharge Instructions (Addendum)
Please present to [facility] in [place] for substance use treatment services.   In the event of worsening symptoms, patient is instructed to call the crisis hotline, 911 and or go to the nearest ED for appropriate evaluation and treatment of symptoms. To follow-up with his/her primary care provider for your other medical issues, concerns and or health care needs.     Please come to [facility] (this facility) during walk in hours for appointment with psychiatrist for further medication management and for therapists for therapy.    Walk in hours are 8-11 AM Monday through Thursday for medication management. Therapy walk in hours are Monday-Wednesday 8 AM-1PM.   It is first come, first -serve; it is best to arrive by 7:00 AM.   On Friday from 1 pm to 4 pm for therapy intake only. Please arrive by 12:00 pm as it is  first come, first -serve.    When you arrive please go upstairs for your appointment. If you are unsure of where to go, inform the front desk that you are here for a walk in appointment and they will assist you with directions upstairs.  Address:  [address] in [place], [place] Ph: [phone]
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
## 2020-09-16 ENCOUNTER — Ambulatory Visit: Payer: Self-pay | Admitting: Gastroenterology

## 2020-11-22 ENCOUNTER — Other Ambulatory Visit: Payer: Self-pay | Admitting: Internal Medicine

## 2020-11-22 ENCOUNTER — Encounter (INDEPENDENT_AMBULATORY_CARE_PROVIDER_SITE_OTHER): Payer: Self-pay | Admitting: Primary Care

## 2020-11-22 ENCOUNTER — Other Ambulatory Visit: Payer: Self-pay

## 2020-11-22 ENCOUNTER — Encounter (HOSPITAL_COMMUNITY): Payer: Self-pay | Admitting: Licensed Clinical Social Worker

## 2020-11-22 ENCOUNTER — Ambulatory Visit (INDEPENDENT_AMBULATORY_CARE_PROVIDER_SITE_OTHER): Payer: No Payment, Other | Admitting: Licensed Clinical Social Worker

## 2020-11-22 ENCOUNTER — Ambulatory Visit (INDEPENDENT_AMBULATORY_CARE_PROVIDER_SITE_OTHER): Payer: Self-pay | Admitting: Primary Care

## 2020-11-22 VITALS — BP 161/101 | HR 66 | Temp 97.7°F | Ht 67.0 in | Wt 168.8 lb

## 2020-11-22 DIAGNOSIS — F1094 Alcohol use, unspecified with alcohol-induced mood disorder: Secondary | ICD-10-CM | POA: Diagnosis not present

## 2020-11-22 DIAGNOSIS — Z760 Encounter for issue of repeat prescription: Secondary | ICD-10-CM

## 2020-11-22 DIAGNOSIS — F4310 Post-traumatic stress disorder, unspecified: Secondary | ICD-10-CM | POA: Insufficient documentation

## 2020-11-22 DIAGNOSIS — I10 Essential (primary) hypertension: Secondary | ICD-10-CM

## 2020-11-22 DIAGNOSIS — F3130 Bipolar disorder, current episode depressed, mild or moderate severity, unspecified: Secondary | ICD-10-CM | POA: Insufficient documentation

## 2020-11-22 DIAGNOSIS — N898 Other specified noninflammatory disorders of vagina: Secondary | ICD-10-CM

## 2020-11-22 DIAGNOSIS — Z23 Encounter for immunization: Secondary | ICD-10-CM

## 2020-11-22 DIAGNOSIS — R300 Dysuria: Secondary | ICD-10-CM

## 2020-11-22 LAB — POCT URINALYSIS DIP (CLINITEK)
Bilirubin, UA: NEGATIVE
Glucose, UA: NEGATIVE mg/dL
Ketones, POC UA: NEGATIVE mg/dL
Leukocytes, UA: NEGATIVE
Nitrite, UA: NEGATIVE
POC PROTEIN,UA: 30 — AB
Spec Grav, UA: 1.025 (ref 1.010–1.025)
Urobilinogen, UA: 0.2 E.U./dL
pH, UA: 6.5 (ref 5.0–8.0)

## 2020-11-22 MED ORDER — HYDROCHLOROTHIAZIDE 25 MG PO TABS
25.0000 mg | ORAL_TABLET | Freq: Every day | ORAL | 0 refills | Status: DC
  Filled 2020-11-22: qty 30, 30d supply, fill #0
  Filled 2021-02-15: qty 30, 30d supply, fill #1

## 2020-11-22 MED ORDER — AMLODIPINE BESYLATE 10 MG PO TABS
ORAL_TABLET | ORAL | 1 refills | Status: DC
  Filled 2020-11-22: qty 30, 30d supply, fill #0
  Filled 2021-02-15: qty 30, 30d supply, fill #1

## 2020-11-22 MED ORDER — FAMOTIDINE 20 MG PO TABS
20.0000 mg | ORAL_TABLET | Freq: Two times a day (BID) | ORAL | 3 refills | Status: DC
  Filled 2020-11-22: qty 60, 30d supply, fill #0

## 2020-11-22 MED FILL — Amitriptyline HCl Tab 25 MG: ORAL | 30 days supply | Qty: 30 | Fill #0 | Status: CN

## 2020-11-22 NOTE — Plan of Care (Signed)
Pt agreeable to plan  ?

## 2020-11-22 NOTE — Patient Instructions (Addendum)
Influenza, Adult Influenza is also called "the flu." It is an infection in the lungs, nose, and throat (respiratory tract). It spreads easily from person to person (is contagious). The flu causes symptoms that are like a cold, along with high fever and body aches. What are the causes? This condition is caused by the influenza virus. You can get the virus by: Breathing in droplets that are in the air after a person infected with the flu coughed or sneezed. Touching something that has the virus on it and then touching your mouth, nose, or eyes. What increases the risk? Certain things may make you more likely to get the flu. These include: Not washing your hands often. Having close contact with many people during cold and flu season. Touching your mouth, eyes, or nose without first washing your hands. Not getting a flu shot every year. You may have a higher risk for the flu, and serious problems, such as a lung infection (pneumonia), if you: Are older than 65. Are pregnant. Have a weakened disease-fighting system (immune system) because of a disease or because you are taking certain medicines. Have a long-term (chronic) condition, such as: Heart, kidney, or lung disease. Diabetes. Asthma. Have a liver disorder. Are very overweight (morbidly obese). Have anemia. What are the signs or symptoms? Symptoms usually begin suddenly and last 4-14 days. They may include: Fever and chills. Headaches, body aches, or muscle aches. Sore throat. Cough. Runny or stuffy (congested) nose. Feeling discomfort in your chest. Not wanting to eat as much as normal. Feeling weak or tired. Feeling dizzy. Feeling sick to your stomach or throwing up. How is this treated? If the flu is found early, you can be treated with antiviral medicine. This can help to reduce how bad the illness is and how long it lasts. This may be given by mouth or through an IV tube. Taking care of yourself at home can help your  symptoms get better. Your doctor may want you to: Take over-the-counter medicines. Drink plenty of fluids. The flu often goes away on its own. If you have very bad symptoms or other problems, you may be treated in a hospital. Follow these instructions at home:   Activity Rest as needed. Get plenty of sleep. Stay home from work or school as told by your doctor. Do not leave home until you do not have a fever for 24 hours without taking medicine. Leave home only to go to your doctor. Eating and drinking Take an ORS (oral rehydration solution). This is a drink that is sold at pharmacies and stores. Drink enough fluid to keep your pee pale yellow. Drink clear fluids in small amounts as you are able. Clear fluids include: Water. Ice chips. Fruit juice mixed with water. Low-calorie sports drinks. Eat bland foods that are easy to digest. Eat small amounts as you are able. These foods include: Bananas. Applesauce. Rice. Lean meats. Toast. Crackers. Do not eat or drink: Fluids that have a lot of sugar or caffeine. Alcohol. Spicy or fatty foods. General instructions Take over-the-counter and prescription medicines only as told by your doctor. Use a cool mist humidifier to add moisture to the air in your home. This can make it easier for you to breathe. When using a cool mist humidifier, clean it daily. Empty water and replace with clean water. Cover your mouth and nose when you cough or sneeze. Wash your hands with soap and water often and for at least 20 seconds. This is also important after  you cough or sneeze. If you cannot use soap and water, use alcohol-based hand sanitizer. Keep all follow-up visits. How is this prevented?  Get a flu shot every year. You may get the flu shot in late summer, fall, or winter. Ask your doctor when you should get your flu shot. Avoid contact with people who are sick during fall and winter. This is cold and flu season. Contact a doctor if: You get  new symptoms. You have: Chest pain. Watery poop (diarrhea). A fever. Your cough gets worse. You start to have more mucus. You feel sick to your stomach. You throw up. Get help right away if you: Have shortness of breath. Have trouble breathing. Have skin or nails that turn a bluish color. Have very bad pain or stiffness in your neck. Get a sudden headache. Get sudden pain in your face or ear. Cannot eat or drink without throwing up. These symptoms may represent a serious problem that is an emergency. Get medical help right away. Call your local emergency services (911 in the [place] Do not wait to see if the symptoms will go away. Do not drive yourself to the hospital. Summary Influenza is also called "the flu." It is an infection in the lungs, nose, and throat. It spreads easily from person to person. Take over-the-counter and prescription medicines only as told by your doctor. Getting a flu shot every year is the best way to not get the flu. This information is not intended to replace advice given to you by your health care provider. Make sure you discuss any questions you have with your health care provider. Document Revised: 10/09/2019 Document Reviewed: 10/09/2019 Elsevier Patient Education  [place]. Hypertension, Adult High blood pressure (hypertension) is when the force of blood pumping through the arteries is too strong. The arteries are the blood vessels that carry blood from the heart throughout the body. Hypertension forces the heart to work harder to pump blood and may cause arteries to become narrow or stiff. Untreated or uncontrolled hypertension can cause a heart attack, heart failure, a stroke, kidney disease, and other problems. A blood pressure reading consists of a higher number over a lower number. Ideally, your blood pressure should be below 120/80. The first ("top") number is called the systolic pressure. It is a measure of the pressure in your arteries as  your heart beats. The second ("bottom") number is called the diastolic pressure. It is a measure of the pressure in your arteries as the heart relaxes. What are the causes? The exact cause of this condition is not known. There are some conditions that result in or are related to high blood pressure. What increases the risk? Some risk factors for high blood pressure are under your control. The following factors may make you more likely to develop this condition: Smoking. Having type 2 diabetes mellitus, high cholesterol, or both. Not getting enough exercise or physical activity. Being overweight. Having too much fat, sugar, calories, or salt (sodium) in your diet. Drinking too much alcohol. Some risk factors for high blood pressure may be difficult or impossible to change. Some of these factors include: Having chronic kidney disease. Having a family history of high blood pressure. Age. Risk increases with age. Race. You may be at higher risk if you are African American. Gender. Men are at higher risk than women before age 66. After age 91, women are at higher risk than men. Having obstructive sleep apnea. Stress. What are the signs or symptoms? High  blood pressure may not cause symptoms. Very high blood pressure (hypertensive crisis) may cause: Headache. Anxiety. Shortness of breath. Nosebleed. Nausea and vomiting. Vision changes. Severe chest pain. Seizures. How is this diagnosed? This condition is diagnosed by measuring your blood pressure while you are seated, with your arm resting on a flat surface, your legs uncrossed, and your feet flat on the floor. The cuff of the blood pressure monitor will be placed directly against the skin of your upper arm at the level of your heart. It should be measured at least twice using the same arm. Certain conditions can cause a difference in blood pressure between your right and left arms. Certain factors can cause blood pressure readings to be  lower or higher than normal for a short period of time: When your blood pressure is higher when you are in a health care provider's office than when you are at home, this is called white coat hypertension. Most people with this condition do not need medicines. When your blood pressure is higher at home than when you are in a health care provider's office, this is called masked hypertension. Most people with this condition may need medicines to control blood pressure. If you have a high blood pressure reading during one visit or you have normal blood pressure with other risk factors, you may be asked to: Return on a different day to have your blood pressure checked again. Monitor your blood pressure at home for 1 week or longer. If you are diagnosed with hypertension, you may have other blood or imaging tests to help your health care provider understand your overall risk for other conditions. How is this treated? This condition is treated by making healthy lifestyle changes, such as eating healthy foods, exercising more, and reducing your alcohol intake. Your health care provider may prescribe medicine if lifestyle changes are not enough to get your blood pressure under control, and if: Your systolic blood pressure is above 130. Your diastolic blood pressure is above 80. Your personal target blood pressure may vary depending on your medical conditions, your age, and other factors. Follow these instructions at home: Eating and drinking  Eat a diet that is high in fiber and potassium, and low in sodium, added sugar, and fat. An example eating plan is called the DASH (Dietary Approaches to Stop Hypertension) diet. To eat this way: Eat plenty of fresh fruits and vegetables. Try to fill one half of your plate at each meal with fruits and vegetables. Eat whole grains, such as whole-wheat pasta, brown rice, or whole-grain bread. Fill about one fourth of your plate with whole grains. Eat or drink low-fat  dairy products, such as skim milk or low-fat yogurt. Avoid fatty cuts of meat, processed or cured meats, and poultry with skin. Fill about one fourth of your plate with lean proteins, such as fish, chicken without skin, beans, eggs, or tofu. Avoid pre-made and processed foods. These tend to be higher in sodium, added sugar, and fat. Reduce your daily sodium intake. Most people with hypertension should eat less than 1,500 mg of sodium a day. Do not drink alcohol if: Your health care provider tells you not to drink. You are pregnant, may be pregnant, or are planning to become pregnant. If you drink alcohol: Limit how much you use to: 0-1 drink a day for women. 0-2 drinks a day for men. Be aware of how much alcohol is in your drink. In the [place] one drink equals one 12 oz bottle of  beer (355 mL), one 5 oz glass of wine (148 mL), or one 1 oz glass of hard liquor (44 mL). Lifestyle  Work with your health care provider to maintain a healthy body weight or to lose weight. Ask what an ideal weight is for you. Get at least 30 minutes of exercise most days of the week. Activities may include walking, swimming, or biking. Include exercise to strengthen your muscles (resistance exercise), such as Pilates or lifting weights, as part of your weekly exercise routine. Try to do these types of exercises for 30 minutes at least 3 days a week. Do not use any products that contain nicotine or tobacco, such as cigarettes, e-cigarettes, and chewing tobacco. If you need help quitting, ask your health care provider. Monitor your blood pressure at home as told by your health care provider. Keep all follow-up visits as told by your health care provider. This is important. Medicines Take over-the-counter and prescription medicines only as told by your health care provider. Follow directions carefully. Blood pressure medicines must be taken as prescribed. Do not skip doses of blood pressure medicine. Doing this puts you  at risk for problems and can make the medicine less effective. Ask your health care provider about side effects or reactions to medicines that you should watch for. Contact a health care provider if you: Think you are having a reaction to a medicine you are taking. Have headaches that keep coming back (recurring). Feel dizzy. Have swelling in your ankles. Have trouble with your vision. Get help right away if you: Develop a severe headache or confusion. Have unusual weakness or numbness. Feel faint. Have severe pain in your chest or abdomen. Vomit repeatedly. Have trouble breathing. Summary Hypertension is when the force of blood pumping through your arteries is too strong. If this condition is not controlled, it may put you at risk for serious complications. Your personal target blood pressure may vary depending on your medical conditions, your age, and other factors. For most people, a normal blood pressure is less than 120/80. Hypertension is treated with lifestyle changes, medicines, or a combination of both. Lifestyle changes include losing weight, eating a healthy, low-sodium diet, exercising more, and limiting alcohol. This information is not intended to replace advice given to you by your health care provider. Make sure you discuss any questions you have with your health care provider. Document Revised: 10/30/2017 Document Reviewed: 10/30/2017 Elsevier Patient Education  [place].
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
## 2020-11-24 ENCOUNTER — Encounter (HOSPITAL_COMMUNITY): Payer: Self-pay | Admitting: Physician Assistant

## 2020-11-24 ENCOUNTER — Ambulatory Visit (INDEPENDENT_AMBULATORY_CARE_PROVIDER_SITE_OTHER): Payer: No Payment, Other | Admitting: Physician Assistant

## 2020-11-24 ENCOUNTER — Other Ambulatory Visit: Payer: Self-pay

## 2020-11-24 VITALS — BP 148/103 | HR 68 | Ht 67.0 in | Wt 168.0 lb

## 2020-11-24 DIAGNOSIS — F411 Generalized anxiety disorder: Secondary | ICD-10-CM | POA: Diagnosis not present

## 2020-11-24 DIAGNOSIS — F99 Mental disorder, not otherwise specified: Secondary | ICD-10-CM

## 2020-11-24 DIAGNOSIS — F3130 Bipolar disorder, current episode depressed, mild or moderate severity, unspecified: Secondary | ICD-10-CM | POA: Diagnosis not present

## 2020-11-24 DIAGNOSIS — F4310 Post-traumatic stress disorder, unspecified: Secondary | ICD-10-CM

## 2020-11-24 DIAGNOSIS — F5105 Insomnia due to other mental disorder: Secondary | ICD-10-CM | POA: Diagnosis not present

## 2020-11-24 MED ORDER — QUETIAPINE FUMARATE 50 MG PO TABS
50.0000 mg | ORAL_TABLET | Freq: Every day | ORAL | 1 refills | Status: DC
  Filled 2020-11-24: qty 30, 18d supply, fill #0
  Filled 2021-02-15: qty 30, 18d supply, fill #1
  Filled 2021-11-02 – 2021-11-09 (×2): qty 30, 18d supply, fill #0

## 2020-11-24 MED ORDER — HYDROXYZINE HCL 10 MG PO TABS
10.0000 mg | ORAL_TABLET | Freq: Three times a day (TID) | ORAL | 1 refills | Status: DC | PRN
  Filled 2020-11-24 – 2021-11-09 (×3): qty 75, 25d supply, fill #0

## 2020-11-24 MED ORDER — VENLAFAXINE HCL ER 37.5 MG PO CP24
37.5000 mg | ORAL_CAPSULE | Freq: Every day | ORAL | 1 refills | Status: DC
  Filled 2020-11-24: qty 30, 30d supply, fill #0

## 2020-11-24 MED ORDER — PRAZOSIN HCL 1 MG PO CAPS
1.0000 mg | ORAL_CAPSULE | Freq: Every day | ORAL | 1 refills | Status: DC
  Filled 2020-11-24: qty 30, 30d supply, fill #0
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
## 2020-11-25 ENCOUNTER — Other Ambulatory Visit: Payer: Self-pay

## 2020-11-28 ENCOUNTER — Other Ambulatory Visit: Payer: Self-pay

## 2020-11-28 ENCOUNTER — Encounter (HOSPITAL_COMMUNITY): Payer: Self-pay

## 2020-11-28 ENCOUNTER — Ambulatory Visit: Payer: Self-pay | Attending: Primary Care

## 2020-11-28 ENCOUNTER — Ambulatory Visit (HOSPITAL_COMMUNITY)
Admission: EM | Admit: 2020-11-28 | Discharge: 2020-11-28 | Disposition: A | Discharge status: Home / Self Care | Payer: Self-pay | Attending: Emergency Medicine | Admitting: Emergency Medicine

## 2020-11-28 DIAGNOSIS — R42 Dizziness and giddiness: Secondary | ICD-10-CM

## 2020-11-28 DIAGNOSIS — H9203 Otalgia, bilateral: Secondary | ICD-10-CM

## 2020-11-28 MED ORDER — CETIRIZINE HCL 10 MG PO CAPS
10.0000 mg | ORAL_CAPSULE | Freq: Every day | ORAL | 0 refills | Status: DC
  Filled 2020-11-28: qty 15, 15d supply, fill #0

## 2020-11-28 MED ORDER — MECLIZINE HCL 25 MG PO TABS
25.0000 mg | ORAL_TABLET | Freq: Three times a day (TID) | ORAL | 0 refills | Status: DC | PRN
  Filled 2020-11-28: qty 30, 10d supply, fill #0

## 2020-11-28 MED ORDER — PREDNISONE 10 MG PO TABS
ORAL_TABLET | ORAL | 0 refills | Status: DC
  Filled 2020-11-28: qty 21, 6d supply, fill #0

## 2020-11-28 MED ORDER — FLUTICASONE PROPIONATE 50 MCG/ACT NA SUSP
1.0000 | Freq: Every day | NASAL | 0 refills | Status: DC
  Filled 2020-11-28: qty 16, 30d supply, fill #0

## 2020-11-28 NOTE — ED Triage Notes (Signed)
Pt presents with bilateral ear fullness and dizziness X 2 months.

## 2020-11-28 NOTE — Discharge Instructions (Signed)
Begin prednisone taper x6 days-begin with 6 tablets on day 1, decrease by 1 tablet each day until complete-6, 5, 4, 3, 2, 1-take with food and earlier in the day if possible Begin Flonase nasal spray 1 to 2 spray in each nostril daily and daily cetirizine over the next 1 to 2 weeks Use meclizine as needed for dizziness-will cause drowsiness, do not drive or work after taking May trial Epley maneuver or logroll/BBQ maneuver at [employer] on YouTube Please follow-up with ENT-contact below

## 2020-11-28 NOTE — ED Provider Notes (Signed)
[place]    CSN: [id number] Arrival date & time: 11/28/20  2202      History   Chief Complaint Chief Complaint  Patient presents with   Ear Fullness   Dizziness    HPI [name] is a 50 y.o. female history of tobacco use, hypertension, presenting today for evaluation of dizziness and ear pain.  Patient reports over the past 6+ months she has had persistent dizziness which she describes as feeling off balance and spinning.  At times will have sensations of feeling lightheaded.  She also has associated bilateral ear pain, reports popping, pressure sensation and sensation of fluid on ears.  Has been seen previously by her PCP for this without any intervention.  She reports history of vertigo.  She denies any vision changes, headache.  Denies any chest pain or shortness of breath.  Denies associated URI symptoms/congestion.  HPI  Past Medical History:  Diagnosis Date   Alcoholism ([place])    Allergy    seasonal   Anxiety    Arthritis    Asthma    Blood transfusion    1989 at [place]   Bronchitis    Chronic bipolar disorder ([place])    Chronic kidney disease    Depression    bipolar   GERD (gastroesophageal reflux disease)    Headache(784.0)    occasional   Hypertension    Mental disorder    bipolar   MRSA infection 2011   left lower leg   Pancreatitis    Pyelonephritis 10/2010   Recurrent upper respiratory infection (URI)    states she has not been to MD and feels like she has  bronchitis now- greenish sputum   Shortness of breath    sometimes with exertion   Sickle cell anemia (HCC)    sickle cell trait    Sickle cell trait ([place])    Sleep apnea    CPAP   Substance abuse ([place])    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   UTI (lower urinary tract infection) 10/2010    Patient Active Problem List   Diagnosis Date Noted   Generalized anxiety disorder 11/24/2020   Insomnia due to other mental disorder 11/24/2020   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    PTSD (post-traumatic stress disorder) 11/22/2020   Bipolar I disorder, most recent episode depressed ([place]) 11/22/2020   Essential hypertension, benign 08/28/2018   Seasonal allergies 08/28/2018   Herpes simplex infection 04/07/2018   Other tear of medial meniscus, current injury, left knee, subsequent encounter 03/27/2018   Fracture of tibial plateau, closed 03/08/2011   GERD (gastroesophageal reflux disease) 03/08/2011   Bipolar disorder ([place]) 03/08/2011   OSA (obstructive sleep apnea) 03/08/2011   Obesity 03/08/2011   Nicotine dependence 03/08/2011    Past Surgical History:  Procedure Laterality Date   CESAREAN SECTION     KNEE ARTHROSCOPY  06/15/2011   Procedure: ARTHROSCOPY KNEE;  Surgeon: Rozanna Box, MD;  Location: [facility];  Service: Orthopedics;  Laterality: Left;  LEFT KNEE SCOPE WITH LYSIS OF ADHESIONS AND MANIPULATION   KNEE ARTHROSCOPY WITH MEDIAL MENISECTOMY Left 03/27/2018   Procedure: LEFT KNEE ARTHROSCOPY WITH PARTIAL MEDIAL MENISCECTOMY;  Surgeon: Mcarthur Rossetti, MD;  Location: [facility];  Service: Orthopedics;  Laterality: Left;   ORIF TIBIA PLATEAU  03/08/2011   Procedure: OPEN REDUCTION INTERNAL FIXATION (ORIF) TIBIAL PLATEAU;  Surgeon: Rozanna Box;  Location: [facility];  Service: Orthopedics;  Laterality: Left;   TUBAL LIGATION  OB History     Gravida  8   Para  4   Term      Preterm      AB  4   Living  4      SAB  2   IAB  2   Ectopic      Multiple      Live Births               Home Medications    Prior to Admission medications   Medication Sig Start Date End Date Taking? Authorizing Provider  Cetirizine HCl 10 MG CAPS Take 1 capsule (10 mg total) by mouth daily for 15 days. 11/28/20 12/13/20 Yes Juliauna Stueve C, PA-C  fluticasone (FLONASE) 50 MCG/ACT nasal spray Place 1-2 sprays into both nostrils daily. 11/28/20  Yes Ziah Turvey C, PA-C  meclizine (ANTIVERT) 25 MG tablet Take 1 tablet (25 mg total)  by mouth 3 (three) times daily as needed for dizziness. 11/28/20  Yes Dianara Smullen C, PA-C  predniSONE (DELTASONE) 10 MG tablet Begin with 6 tabs on day 1, 5 tab on day 2, 4 tab on day 3, 3 tab on day 4, 2 tab on day 5, 1 tab on day 6-take with food 11/28/20  Yes Payson Evrard C, PA-C  amLODipine (NORVASC) 10 MG tablet Take 1 tablet daily 11/22/20   Kerin Perna, NP  famotidine (PEPCID) 20 MG tablet Take 1 tablet (20 mg total) by mouth 2 (two) times daily. 11/22/20   Thornton Park, MD  hydrochlorothiazide (HYDRODIURIL) 25 MG tablet Take 1 tablet (25 mg total) by mouth daily. 11/22/20   Kerin Perna, NP  hydrOXYzine (ATARAX/VISTARIL) 10 MG tablet Take 1 tablet (10 mg total) by mouth 3 (three) times daily as needed. 11/24/20   Nwoko, Terese Door, PA  prazosin (MINIPRESS) 1 MG capsule Take 1 capsule (1 mg total) by mouth at bedtime. 11/24/20   Nwoko, Terese Door, PA  QUEtiapine (SEROQUEL) 50 MG tablet Take 1 tablet (50 mg total) at bedtime for 6 days then continue taking 2 tablets (100 mg total) at bedtime. 11/24/20 11/24/21  Nwoko, Terese Door, PA  venlafaxine XR (EFFEXOR-XR) 37.5 MG 24 hr capsule Take 1 capsule (37.5 mg total) by mouth daily. 11/24/20 11/24/21  Malachy Mood, PA    Family History Family History  Problem Relation Age of Onset   Thyroid disease Father    Colon cancer Father    Cystic fibrosis Sister    Heart disease Brother    Esophageal cancer Paternal Grandmother    Anesthesia problems Neg Hx    Breast cancer Neg Hx    Colon polyps Neg Hx    Rectal cancer Neg Hx    Stomach cancer Neg Hx     Social History Social History   Tobacco Use   Smoking status: Every Day    Packs/day: 0.50    Years: 24.00    Pack years: 12.00    Types: Cigarettes   Smokeless tobacco: Never  Vaping Use   Vaping Use: Never used  Substance Use Topics   Alcohol use: Not Currently    Comment: was drink 2 5th daily up unti 11/13/20   Drug use: Not Currently    Types: "Crack" cocaine     Comment: none since 2000     Allergies   Aspirin, Banana, Latex, Penicillins, Septra [bactrim], Shellfish allergy, Strawberry extract, and Tape   Review of Systems Review of Systems  Constitutional:  Negative for fatigue  and fever.  HENT:  Positive for ear pain. Negative for congestion, sinus pressure and sore throat.   Eyes:  Negative for photophobia, pain and visual disturbance.  Respiratory:  Negative for cough and shortness of breath.   Cardiovascular:  Negative for chest pain.  Gastrointestinal:  Negative for abdominal pain, nausea and vomiting.  Genitourinary:  Negative for decreased urine volume and hematuria.  Musculoskeletal:  Negative for myalgias, neck pain and neck stiffness.  Neurological:  Positive for dizziness and headaches. Negative for syncope, facial asymmetry, speech difficulty, weakness, light-headedness and numbness.    Physical Exam Triage Vital Signs ED Triage Vitals  Enc Vitals Group     BP 11/28/20 1022 132/80     Pulse Rate 11/28/20 1022 94     Resp 11/28/20 1022 18     Temp 11/28/20 1022 99.2 F (37.3 C)     Temp Source 11/28/20 1022 Oral     SpO2 11/28/20 1022 98 %     Weight --      Height --      Head Circumference --      Peak Flow --      Pain Score 11/28/20 1025 1     Pain Loc --      Pain Edu? --      Excl. in [place]? --    No data found.  Updated Vital Signs BP 132/80 (BP Location: Right Arm)   Pulse 94   Temp 99.2 F (37.3 C) (Oral)   Resp 18   LMP 11/20/2020   SpO2 98%   Visual Acuity Right Eye Distance:   Left Eye Distance:   Bilateral Distance:    Right Eye Near:   Left Eye Near:    Bilateral Near:     Physical Exam Vitals and nursing note reviewed.  Constitutional:      Appearance: She is well-developed.     Comments: No acute distress  HENT:     Head: Normocephalic and atraumatic.     Right Ear: Tympanic membrane normal.     Left Ear: Tympanic membrane normal.     Nose: Nose normal.     Mouth/Throat:      Comments: Oral mucosa pink and moist, no tonsillar enlargement or exudate. Posterior pharynx patent and nonerythematous, no uvula deviation or swelling. Normal phonation.  Eyes:     Conjunctiva/sclera: Conjunctivae normal.  Cardiovascular:     Rate and Rhythm: Normal rate and regular rhythm.  Pulmonary:     Effort: Pulmonary effort is normal. No respiratory distress.     Comments: Breathing comfortably at rest, CTABL, no wheezing, rales or other adventitious sounds auscultated  Abdominal:     General: There is no distension.  Musculoskeletal:        General: Normal range of motion.     Cervical back: Neck supple.  Skin:    General: Skin is warm and dry.  Neurological:     Mental Status: She is alert and oriented to person, place, and time.     UC Treatments / Results  Labs (all labs ordered are listed, but only abnormal results are displayed) Labs Reviewed - No data to display  EKG   Radiology No results found.  Procedures Procedures (including critical care time)  Medications Ordered in UC Medications - No data to display  Initial Impression / Assessment and Plan / UC Course  I have reviewed the triage vital signs and the nursing notes.  Pertinent labs & imaging results that were available  during my care of the patient were reviewed by me and considered in my medical decision making (see chart for details).     For exam overall unremarkable, TMs intact, pearly gray, minimal cerumen in bilateral canals, suspect likely underlying eustachian tube dysfunction/vertigo contributing to symptoms, no sign of infection at this time.  No other neurodeficits.  Will do trial of prednisone course, Flonase and Zyrtec, meclizine as needed for dizziness, discussed positional maneuvers to try at home, follow-up with ENT given symptoms 6+ months.  Discussed strict return precautions. Patient verbalized understanding and is agreeable with plan.  Final Clinical Impressions(s) / UC  Diagnoses   Final diagnoses:  Dizziness  Vertigo  Otalgia of both ears     Discharge Instructions      Begin prednisone taper x6 days-begin with 6 tablets on day 1, decrease by 1 tablet each day until complete-6, 5, 4, 3, 2, 1-take with food and earlier in the day if possible Begin Flonase nasal spray 1 to 2 spray in each nostril daily and daily cetirizine over the next 1 to 2 weeks Use meclizine as needed for dizziness-will cause drowsiness, do not drive or work after taking May trial Epley maneuver or logroll/BBQ maneuver at [employer] on YouTube Please follow-up with ENT-contact below     ED Prescriptions     Medication Sig Dispense Auth. Provider   predniSONE (DELTASONE) 10 MG tablet Begin with 6 tabs on day 1, 5 tab on day 2, 4 tab on day 3, 3 tab on day 4, 2 tab on day 5, 1 tab on day 6-take with food 21 tablet Daijha Leggio C, PA-C   fluticasone (FLONASE) 50 MCG/ACT nasal spray Place 1-2 sprays into both nostrils daily. 16 g Abrielle Finck C, PA-C   Cetirizine HCl 10 MG CAPS Take 1 capsule (10 mg total) by mouth daily for 15 days. 15 capsule Issaiah Seabrooks C, PA-C   meclizine (ANTIVERT) 25 MG tablet Take 1 tablet (25 mg total) by mouth 3 (three) times daily as needed for dizziness. 30 tablet Deairra Halleck, [place] C, PA-C      PDMP not reviewed this encounter.   Janith Lima, [place] 11/28/20 1132

## 2020-11-29 ENCOUNTER — Other Ambulatory Visit: Payer: Self-pay

## 2020-12-01 ENCOUNTER — Other Ambulatory Visit: Payer: Self-pay

## 2020-12-02 ENCOUNTER — Encounter (HOSPITAL_COMMUNITY): Payer: Self-pay | Admitting: Physician Assistant

## 2020-12-14 ENCOUNTER — Ambulatory Visit (HOSPITAL_COMMUNITY): Payer: No Payment, Other | Admitting: Licensed Clinical Social Worker

## 2020-12-21 ENCOUNTER — Telehealth: Payer: Self-pay | Admitting: Primary Care

## 2020-12-21 NOTE — Telephone Encounter (Signed)
Pt was sent a letter from financial dept. Inform them, that the application they submitted was incomplete, since they were missing some documentation at the time of the appointment, Pt need to reschedule and resubmit all new papers and application for [employer] and [employer], P.S. old documents has been sent back by mail to the Pt and Pt. need to make a new appt. 

## 2021-01-04 ENCOUNTER — Ambulatory Visit (HOSPITAL_COMMUNITY): Payer: No Payment, Other | Admitting: Licensed Clinical Social Worker

## 2021-01-13 ENCOUNTER — Encounter (HOSPITAL_COMMUNITY): Payer: No Payment, Other | Admitting: Physician Assistant

## 2021-01-18 ENCOUNTER — Ambulatory Visit (HOSPITAL_COMMUNITY): Payer: No Payment, Other | Admitting: Licensed Clinical Social Worker

## 2021-02-06 ENCOUNTER — Other Ambulatory Visit: Payer: Self-pay

## 2021-02-06 ENCOUNTER — Ambulatory Visit (INDEPENDENT_AMBULATORY_CARE_PROVIDER_SITE_OTHER): Payer: Self-pay

## 2021-02-06 ENCOUNTER — Ambulatory Visit (INDEPENDENT_AMBULATORY_CARE_PROVIDER_SITE_OTHER): Payer: Self-pay | Admitting: Orthopaedic Surgery

## 2021-02-06 DIAGNOSIS — G8929 Other chronic pain: Secondary | ICD-10-CM

## 2021-02-06 DIAGNOSIS — M5441 Lumbago with sciatica, right side: Secondary | ICD-10-CM

## 2021-02-06 DIAGNOSIS — M4807 Spinal stenosis, lumbosacral region: Secondary | ICD-10-CM

## 2021-02-06 NOTE — Progress Notes (Signed)
The patient is sometimes seen before.  She continues to deal with chronic right-sided sciatica and low back pain.  A year ago we did recommend a MRI after attempts at conservative treatment and failed.  She was unable to afford to get that MRI.  She has had no acute change in her medical status.  She continues to have right-sided sciatica and radiating pain in her back that is really been bothering her quite a bit.  She also has a history of multiple joint complaints as well as cramping in her hands and knee pain.  I have ordered some of these before in terms of her knees and this showed a normal-appearing knee joint.  She has a history of a tibial plateau fracture that was fixed years ago.  She had an MRI of the left knee did not show any cartilage damage.  Her biggest complaint is continued right-sided sciatica.  I saw CT scan from last year of the abdomen pelvis.  That was for chronic pancreatitis.  The CT scan of the back and hips could be seen in the showed no irregularity of the right hip or the lumbar spine.  She continues to have positive straight leg raise to the right side.  She has a lot of pain of the posterior pelvis and sciatic region to palpation.  I can easily put her left hip and right hip through internal and external rotation and she does seem to withdraw to pain on examination of the right side but not in the groin and out of the trochanteric area.  2 views lumbar spine today show no acute findings and normal alignment.  At this point we will send her for MRI of the lumbar spine since this been going on for so many years now and seems to be worsening.  This will to assess for any source of her right-sided sciatica.

## 2021-02-09 ENCOUNTER — Ambulatory Visit: Payer: Self-pay

## 2021-02-14 ENCOUNTER — Telehealth: Payer: Self-pay | Admitting: Orthopaedic Surgery

## 2021-02-14 NOTE — Telephone Encounter (Signed)
Called [name] and was unable to leave vm for pt did not have a vm to set an MRI review appt with DR. [name] after 02/24/21

## 2021-02-15 ENCOUNTER — Other Ambulatory Visit: Payer: Self-pay

## 2021-02-15 ENCOUNTER — Ambulatory Visit: Payer: Self-pay | Attending: Primary Care

## 2021-02-16 ENCOUNTER — Telehealth: Payer: Self-pay | Admitting: Orthopaedic Surgery

## 2021-02-16 NOTE — Telephone Encounter (Signed)
Called pt and was unable to leave a vm for pt doesn't have a vm. Pt need to set a MRI review appt with DR. Ninfa Linden after 02/24/21

## 2021-02-22 ENCOUNTER — Other Ambulatory Visit: Payer: Self-pay

## 2021-02-24 ENCOUNTER — Other Ambulatory Visit: Payer: Self-pay

## 2021-03-07 ENCOUNTER — Telehealth: Payer: Self-pay | Admitting: Primary Care

## 2021-03-07 NOTE — Telephone Encounter (Signed)
Pt was sent a letter from financial dept. Inform them, that the application they submitted was incomplete, since they were missing some documentation at the time of the appointment, Pt need to reschedule and resubmit all new papers and application for [employer] and [employer], P.S. old documents has been sent back by mail to the Pt and Pt. need to make a new appt. 

## 2021-03-16 ENCOUNTER — Ambulatory Visit (INDEPENDENT_AMBULATORY_CARE_PROVIDER_SITE_OTHER): Payer: Self-pay | Admitting: *Deleted

## 2021-03-16 NOTE — Telephone Encounter (Signed)
Summary: Possible respiratory infection, experiencing ear ache,headaches,cough   Pt stated she possibly has a respiratory infection, experiencing earache,headaches,cough started 2 days ago.   No appointments available virtual/in person until 04/04/2021   Seeking clinical advice.       Left VM to call back to discuss symptoms.

## 2021-03-16 NOTE — Telephone Encounter (Signed)
3rd attempt to reach pt. Routing to provider for resolution per protocol.

## 2021-03-16 NOTE — Telephone Encounter (Signed)
2nd attempt to reach pt to discuss symptoms. Left VM to call back.

## 2021-06-19 ENCOUNTER — Ambulatory Visit: Payer: Self-pay | Admitting: Orthopaedic Surgery

## 2021-07-18 ENCOUNTER — Ambulatory Visit: Payer: Self-pay | Admitting: Student

## 2021-07-18 NOTE — Progress Notes (Deleted)
  SUBJECTIVE:   CHIEF COMPLAINT / HPI:   Current Issues  PMH  PSgxHx  Social Hx  Medications   PERTINENT  PMH / PSH: ***  Past Medical History:  Diagnosis Date   Alcoholism ([place])    Allergy    seasonal   Anxiety    Arthritis    Asthma    Blood transfusion    1989 at [place]   Bronchitis    Chronic bipolar disorder ([place])    Chronic kidney disease    Depression    bipolar   GERD (gastroesophageal reflux disease)    Headache(784.0)    occasional   Hypertension    Mental disorder    bipolar   MRSA infection 2011   left lower leg   Pancreatitis    Pyelonephritis 10/2010   Recurrent upper respiratory infection (URI)    states she has not been to MD and feels like she has  bronchitis now- greenish sputum   Shortness of breath    sometimes with exertion   Sickle cell anemia ([facility])    sickle cell trait    Sickle cell trait ([facility])    Sleep apnea    CPAP   Substance abuse ([place])    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   UTI (lower urinary tract infection) 10/2010    Past Surgical History:  Procedure Laterality Date   CESAREAN SECTION     KNEE ARTHROSCOPY  06/15/2011   Procedure: ARTHROSCOPY KNEE;  Surgeon: Rozanna Box, MD;  Location: [facility];  Service: Orthopedics;  Laterality: Left;  LEFT KNEE SCOPE WITH LYSIS OF ADHESIONS AND MANIPULATION   KNEE ARTHROSCOPY WITH MEDIAL MENISECTOMY Left 03/27/2018   Procedure: LEFT KNEE ARTHROSCOPY WITH PARTIAL MEDIAL MENISCECTOMY;  Surgeon: Mcarthur Rossetti, MD;  Location: [facility];  Service: Orthopedics;  Laterality: Left;   ORIF TIBIA PLATEAU  03/08/2011   Procedure: OPEN REDUCTION INTERNAL FIXATION (ORIF) TIBIAL PLATEAU;  Surgeon: Rozanna Box;  Location: [facility];  Service: Orthopedics;  Laterality: Left;   TUBAL LIGATION      OBJECTIVE:  There were no vitals taken for this visit.  General: NAD, pleasant, able to participate in exam Cardiac: RRR, no murmurs auscultated. Respiratory: CTAB, normal effort, no  wheezes, rales or rhonchi Abdomen: soft, non-tender, non-distended, normoactive bowel sounds Extremities: warm and well perfused, no edema or cyanosis. Skin: warm and dry, no rashes noted Neuro: alert, no obvious focal deficits, speech normal Psych: Normal affect and mood  ASSESSMENT/PLAN:  No problem-specific Assessment & Plan notes found for this encounter.   No orders of the defined types were placed in this encounter.  No orders of the defined types were placed in this encounter.  No follow-ups on file. '@SIGNNOTE'$ @ {    This will disappear when note is signed, click to select method of visit    :1}

## 2021-07-18 NOTE — Patient Instructions (Incomplete)
It was great to see you! Thank you for allowing me to participate in your care!  I recommend that you always bring your medications to each appointment as this makes it easy to ensure we are on the correct medications and helps us not miss when refills are needed.  Our plans for today:  - *** -   We are checking some labs today, I will call you if they are abnormal will send you a MyChart message or a letter if they are normal.  If you do not hear about your labs in the next 2 weeks please let us know.***  Take care and seek immediate care sooner if you develop any concerns.   Dr. Clayson Riling, MD [facility]  

## 2021-07-28 ENCOUNTER — Ambulatory Visit (INDEPENDENT_AMBULATORY_CARE_PROVIDER_SITE_OTHER): Payer: Self-pay | Admitting: Primary Care

## 2021-10-29 ENCOUNTER — Emergency Department (HOSPITAL_COMMUNITY)
Admission: EM | Admit: 2021-10-29 | Discharge: 2021-10-29 | Disposition: A | Discharge status: Home / Self Care | Payer: Self-pay | Attending: Emergency Medicine | Admitting: Emergency Medicine

## 2021-10-29 ENCOUNTER — Encounter (HOSPITAL_COMMUNITY): Payer: Self-pay

## 2021-10-29 ENCOUNTER — Other Ambulatory Visit: Payer: Self-pay

## 2021-10-29 ENCOUNTER — Emergency Department (HOSPITAL_COMMUNITY): Payer: Self-pay

## 2021-10-29 DIAGNOSIS — I129 Hypertensive chronic kidney disease with stage 1 through stage 4 chronic kidney disease, or unspecified chronic kidney disease: Secondary | ICD-10-CM | POA: Insufficient documentation

## 2021-10-29 DIAGNOSIS — Z79899 Other long term (current) drug therapy: Secondary | ICD-10-CM | POA: Insufficient documentation

## 2021-10-29 DIAGNOSIS — J45909 Unspecified asthma, uncomplicated: Secondary | ICD-10-CM | POA: Insufficient documentation

## 2021-10-29 DIAGNOSIS — N12 Tubulo-interstitial nephritis, not specified as acute or chronic: Secondary | ICD-10-CM | POA: Insufficient documentation

## 2021-10-29 DIAGNOSIS — R109 Unspecified abdominal pain: Secondary | ICD-10-CM

## 2021-10-29 DIAGNOSIS — N189 Chronic kidney disease, unspecified: Secondary | ICD-10-CM | POA: Insufficient documentation

## 2021-10-29 DIAGNOSIS — K920 Hematemesis: Secondary | ICD-10-CM | POA: Insufficient documentation

## 2021-10-29 DIAGNOSIS — R112 Nausea with vomiting, unspecified: Secondary | ICD-10-CM

## 2021-10-29 DIAGNOSIS — Z91040 Latex allergy status: Secondary | ICD-10-CM | POA: Insufficient documentation

## 2021-10-29 DIAGNOSIS — D649 Anemia, unspecified: Secondary | ICD-10-CM | POA: Insufficient documentation

## 2021-10-29 LAB — URINALYSIS, ROUTINE W REFLEX MICROSCOPIC
Bilirubin Urine: NEGATIVE
Glucose, UA: NEGATIVE mg/dL
Ketones, ur: NEGATIVE mg/dL
Leukocytes,Ua: NEGATIVE
Nitrite: POSITIVE — AB
Protein, ur: NEGATIVE mg/dL
Specific Gravity, Urine: 1.008 (ref 1.005–1.030)
pH: 6 (ref 5.0–8.0)

## 2021-10-29 LAB — CBC
HCT: 36.5 % (ref 36.0–46.0)
Hemoglobin: 11.6 g/dL — ABNORMAL LOW (ref 12.0–15.0)
MCH: 25.8 pg — ABNORMAL LOW (ref 26.0–34.0)
MCHC: 31.8 g/dL (ref 30.0–36.0)
MCV: 81.3 fL (ref 80.0–100.0)
Platelets: 326 10*3/uL (ref 150–400)
RBC: 4.49 MIL/uL (ref 3.87–5.11)
RDW: 20.5 % — ABNORMAL HIGH (ref 11.5–15.5)
WBC: 4.1 10*3/uL (ref 4.0–10.5)
nRBC: 0 % (ref 0.0–0.2)

## 2021-10-29 LAB — COMPREHENSIVE METABOLIC PANEL
ALT: 15 U/L (ref 0–44)
AST: 33 U/L (ref 15–41)
Albumin: 4.1 g/dL (ref 3.5–5.0)
Alkaline Phosphatase: 93 U/L (ref 38–126)
Anion gap: 15 (ref 5–15)
BUN: 7 mg/dL (ref 6–20)
CO2: 20 mmol/L — ABNORMAL LOW (ref 22–32)
Calcium: 8.6 mg/dL — ABNORMAL LOW (ref 8.9–10.3)
Chloride: 109 mmol/L (ref 98–111)
Creatinine, Ser: 0.61 mg/dL (ref 0.44–1.00)
GFR, Estimated: >60 mL/min (ref >60)
Glucose, Bld: 93 mg/dL (ref 70–99)
Potassium: 3.7 mmol/L (ref 3.5–5.1)
Sodium: 144 mmol/L (ref 135–145)
Total Bilirubin: 0.6 mg/dL (ref 0.3–1.2)
Total Protein: 8.1 g/dL (ref 6.5–8.1)

## 2021-10-29 LAB — TYPE AND SCREEN
ABO/RH(D): O POS
Antibody Screen: NEGATIVE

## 2021-10-29 LAB — HCG, QUANTITATIVE, PREGNANCY: hCG, Beta Chain, Quant, S: <1 m[IU]/mL (ref <5)

## 2021-10-29 LAB — LIPASE, BLOOD: Lipase: 23 U/L (ref 11–51)

## 2021-10-29 MED ORDER — ONDANSETRON HCL 4 MG/2ML IJ SOLN
4.0000 mg | Freq: Once | INTRAMUSCULAR | Status: AC
  Administered 2021-10-29: 4 mg via INTRAVENOUS
  Filled 2021-10-29: qty 2

## 2021-10-29 MED ORDER — SODIUM CHLORIDE 0.9 % IV SOLN
1.0000 g | Freq: Once | INTRAVENOUS | Status: AC
  Administered 2021-10-29: 1 g via INTRAVENOUS
  Filled 2021-10-29: qty 10

## 2021-10-29 MED ORDER — DICYCLOMINE HCL 20 MG PO TABS
20.0000 mg | ORAL_TABLET | Freq: Two times a day (BID) | ORAL | 0 refills | Status: DC
  Filled 2021-11-02 – 2021-11-09 (×2): qty 20, 10d supply, fill #0

## 2021-10-29 MED ORDER — CEPHALEXIN 500 MG PO CAPS
500.0000 mg | ORAL_CAPSULE | Freq: Three times a day (TID) | ORAL | 0 refills | Status: AC
  Filled 2021-11-02 – 2021-11-09 (×2): qty 30, 10d supply, fill #0

## 2021-10-29 MED ORDER — IOHEXOL 300 MG/ML  SOLN
100.0000 mL | Freq: Once | INTRAMUSCULAR | Status: AC | PRN
  Administered 2021-10-29: 100 mL via INTRAVENOUS

## 2021-10-29 MED ORDER — ONDANSETRON 4 MG PO TBDP
4.0000 mg | ORAL_TABLET | Freq: Three times a day (TID) | ORAL | 0 refills | Status: DC | PRN
Start: 2021-10-29 — End: 2022-04-04
  Filled 2021-11-02 – 2021-11-09 (×2): qty 20, 7d supply, fill #0

## 2021-10-29 MED ORDER — MORPHINE SULFATE (PF) 4 MG/ML IV SOLN
4.0000 mg | Freq: Once | INTRAVENOUS | Status: AC
  Administered 2021-10-29: 4 mg via INTRAVENOUS
  Filled 2021-10-29: qty 1

## 2021-10-29 MED ORDER — LACTATED RINGERS IV BOLUS
1000.0000 mL | Freq: Once | INTRAVENOUS | Status: AC
  Administered 2021-10-29: 1000 mL via INTRAVENOUS

## 2021-10-29 MED ORDER — CHLORDIAZEPOXIDE HCL 25 MG PO CAPS: ORAL_CAPSULE | ORAL | 0 refills | Status: DC

## 2021-10-29 MED ORDER — PANTOPRAZOLE 80MG IVPB - SIMPLE MED
80.0000 mg | Freq: Once | INTRAVENOUS | Status: DC
Start: 2021-10-29 — End: 2021-10-29
  Filled 2021-10-29: qty 100

## 2021-10-29 MED ORDER — PANTOPRAZOLE SODIUM 20 MG PO TBEC
40.0000 mg | DELAYED_RELEASE_TABLET | Freq: Every day | ORAL | 0 refills | Status: DC
  Filled 2021-11-02 – 2021-11-09 (×2): qty 28, 14d supply, fill #0

## 2021-10-29 NOTE — ED Triage Notes (Signed)
Pt reports hematemesis, rectal bleeding, and abdominal pain over the past few months. Hx of heaving alcohol intake and pancreatitis.

## 2021-10-29 NOTE — ED Notes (Signed)
Unable to get IV access, second RN to attempt [place] IV

## 2021-10-29 NOTE — ED Provider Notes (Signed)
[facility] DEPT Provider Note   CSN: [id number] Arrival date & time: 10/29/21  1139     History {Add pertinent medical, surgical, social history, OB history to HPI:1} Chief Complaint  Patient presents with   Hematemesis   Rectal Bleeding    [name] is a 51 y.o. female.  HPI     51 year old female with a history of chronic kidney disease, alcoholism, bipolar disorder, hypertension, sickle cell trait, who presents to concern for hematemesis, rectal bleeding and abdominal pain over the last few months.  If lay on back, on side it is bad Back pain for the last year, worse laying on side, worse standing for a long time at work Has rod and screws in left leg, when standing back and left leg hurting, difficulty working full 8 hours Abdominal pain for about 3 years, has had hx of pancreatitis, coming and going, this episode started after starting drinking a lot. Tried to stop drinking 2 years ago, husband drinks.  Past month abdominal pain has been worse. If drink something throws up, can't keep fluids down, if drinking colored stuff it is worse.  Nausea and vomiting for last 2 weeks.  Vaginal bleeding, fibroids. Cycle started yesterday then stopped today. Rectal bleeding when go to restroom is bright red blood with bowel movements, bright red dripping for about 1 week.l  Vomiting pinkish color blood with emesis.    Previously saw Dr. Dahlia Bailiff  Past Medical History:  Diagnosis Date   Alcoholism [facility])    Allergy    seasonal   Anxiety    Arthritis    Asthma    Blood transfusion    1989 at [place]   Bronchitis    Chronic bipolar disorder ([place])    Chronic kidney disease    Depression    bipolar   GERD (gastroesophageal reflux disease)    Headache(784.0)    occasional   Hypertension    Mental disorder    bipolar   MRSA infection 2011   left lower leg   Pancreatitis    Pyelonephritis 10/2010   Recurrent upper respiratory  infection (URI)    states she has not been to MD and feels like she has  bronchitis now- greenish sputum   Shortness of breath    sometimes with exertion   Sickle cell anemia (H[facility])    sickle cell trait    Sickle cell trait ([facility])    Sleep apnea    CPAP   Substance abuse ([place])    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   UTI (lower urinary tract infection) 10/2010     Home Medications Prior to Admission medications   Medication Sig Start Date End Date Taking? Authorizing Provider  amLODipine (NORVASC) 10 MG tablet Take 1 tablet daily 11/22/20   Kerin Perna, NP  Cetirizine HCl 10 MG CAPS Take 1 capsule (10 mg total) by mouth daily for 15 days. 11/28/20 12/13/20  Wieters, Hallie C, PA-C  famotidine (PEPCID) 20 MG tablet Take 1 tablet (20 mg total) by mouth 2 (two) times daily. 11/22/20   Thornton Park, MD  fluticasone (FLONASE) 50 MCG/ACT nasal spray Place 1-2 sprays into both nostrils daily. 11/28/20   Wieters, Hallie C, PA-C  hydrochlorothiazide (HYDRODIURIL) 25 MG tablet Take 1 tablet (25 mg total) by mouth daily. 11/22/20   Kerin Perna, NP  hydrOXYzine (ATARAX/VISTARIL) 10 MG tablet Take 1 tablet (10 mg total) by mouth 3 (three) times daily as needed. 11/24/20   Nwoko,  Terese Door, PA  meclizine (ANTIVERT) 25 MG tablet Take 1 tablet (25 mg total) by mouth 3 (three) times daily as needed for dizziness. 11/28/20   Wieters, Hallie C, PA-C  prazosin (MINIPRESS) 1 MG capsule Take 1 capsule (1 mg total) by mouth at bedtime. 11/24/20   Nwoko, Terese Door, PA  predniSONE (DELTASONE) 10 MG tablet Begin with 6 tabs on day 1, 5 tab on day 2, 4 tab on day 3, 3 tab on day 4, 2 tab on day 5, 1 tab on day 6-take with food 11/28/20   Wieters, [employer] C, PA-C  QUEtiapine (SEROQUEL) 50 MG tablet Take 1 tablet (50 mg total) at bedtime for 6 days then continue taking 2 tablets (100 mg total) at bedtime. 11/24/20 11/24/21  Nwoko, Terese Door, PA  venlafaxine XR (EFFEXOR-XR) 37.5 MG 24 hr capsule Take 1 capsule (37.5 mg total) by  mouth daily. 11/24/20 11/24/21  Malachy Mood, PA      Allergies    Aspirin, Banana, Latex, Penicillins, Septra [bactrim], Shellfish allergy, Strawberry extract, and Tape    Review of Systems   Review of Systems  Physical Exam Updated Vital Signs BP (!) 148/117 (BP Location: Right Arm)   Pulse 78   Temp 99.4 F (37.4 C) (Oral)   Resp (!) 9   SpO2 100%  Physical Exam  ED Results / Procedures / Treatments   Labs (all labs ordered are listed, but only abnormal results are displayed) Labs Reviewed  COMPREHENSIVE METABOLIC PANEL - Abnormal; Notable for the following components:      Result Value   CO2 20 (*)    Calcium 8.6 (*)    All other components within normal limits  CBC - Abnormal; Notable for the following components:   Hemoglobin 11.6 (*)    MCH 25.8 (*)    RDW 20.5 (*)    All other components within normal limits  URINALYSIS, ROUTINE W REFLEX MICROSCOPIC - Abnormal; Notable for the following components:   APPearance HAZY (*)    Hgb urine dipstick LARGE (*)    Nitrite POSITIVE (*)    Bacteria, UA MANY (*)    All other components within normal limits  LIPASE, BLOOD  HCG, QUANTITATIVE, PREGNANCY  POC OCCULT BLOOD, ED  TYPE AND SCREEN    EKG None  Radiology No results found.  Procedures Procedures  {Document cardiac monitor, telemetry assessment procedure when appropriate:1}  Medications Ordered in ED Medications  lactated ringers bolus 1,000 mL (1,000 mLs Intravenous New Bag/Given 10/29/21 1416)  ondansetron (ZOFRAN) injection 4 mg (4 mg Intravenous Given 10/29/21 1417)    ED Course/ Medical Decision Making/ A&P                           Medical Decision Making Amount and/or Complexity of Data Reviewed Labs: ordered.  Risk Prescription drug management.   ***  Labs are completed and personally advised interpreted by me and show no evidence of pancreatitis, no hepatitis, normal bilirubin.  Hemoglobin is 11.6, similar to prior values in January  2022, was 12 last year.     Urinalysis looks consistent with possible urinary tract infection with nitrite positive, many bacteria.  Pregnancy test is negative  {Document critical care time when appropriate:1} {Document review of labs and clinical decision tools ie heart score, Chads2Vasc2 etc:1}  {Document your independent review of radiology images, and any outside records:1} {Document your discussion with family members, caretakers, and with consultants:1} {Document social  determinants of health affecting pt's care:1} {Document your decision making why or why not admission, treatments were needed:1} Final Clinical Impression(s) / ED Diagnoses Final diagnoses:  None    Rx / DC Orders ED Discharge Orders     None

## 2021-11-02 ENCOUNTER — Other Ambulatory Visit (HOSPITAL_COMMUNITY): Payer: Self-pay | Admitting: Physician Assistant

## 2021-11-02 ENCOUNTER — Other Ambulatory Visit: Payer: Self-pay

## 2021-11-02 DIAGNOSIS — F411 Generalized anxiety disorder: Secondary | ICD-10-CM

## 2021-11-02 MED ORDER — CHLORDIAZEPOXIDE HCL 25 MG PO CAPS
ORAL_CAPSULE | ORAL | 0 refills | Status: DC
  Filled 2021-11-02 – 2021-11-09 (×2): qty 10, 3d supply, fill #0

## 2021-11-08 ENCOUNTER — Other Ambulatory Visit: Payer: Self-pay

## 2021-11-09 ENCOUNTER — Other Ambulatory Visit: Payer: Self-pay

## 2021-11-13 ENCOUNTER — Other Ambulatory Visit: Payer: Self-pay

## 2021-12-20 ENCOUNTER — Other Ambulatory Visit: Payer: Self-pay

## 2021-12-20 MED ORDER — DICLOFENAC SODIUM ER 100 MG PO TB24
100.0000 mg | ORAL_TABLET | Freq: Every day | ORAL | 2 refills | Status: DC
  Filled 2021-12-20 – 2022-03-13 (×2): qty 30, 30d supply, fill #0

## 2021-12-20 MED ORDER — KETOROLAC TROMETHAMINE 10 MG PO TABS
10.0000 mg | ORAL_TABLET | Freq: Three times a day (TID) | ORAL | 0 refills | Status: DC
  Filled 2021-12-20 – 2022-03-13 (×2): qty 12, 4d supply, fill #0

## 2021-12-25 ENCOUNTER — Other Ambulatory Visit: Payer: Self-pay

## 2021-12-25 ENCOUNTER — Other Ambulatory Visit (HOSPITAL_COMMUNITY): Payer: Self-pay | Admitting: Physician Assistant

## 2021-12-25 DIAGNOSIS — F411 Generalized anxiety disorder: Secondary | ICD-10-CM

## 2021-12-27 ENCOUNTER — Other Ambulatory Visit: Payer: Self-pay

## 2022-03-13 ENCOUNTER — Other Ambulatory Visit: Payer: Self-pay

## 2022-03-14 ENCOUNTER — Other Ambulatory Visit: Payer: Self-pay

## 2022-03-20 ENCOUNTER — Ambulatory Visit (INDEPENDENT_AMBULATORY_CARE_PROVIDER_SITE_OTHER): Payer: Self-pay | Admitting: *Deleted

## 2022-03-20 NOTE — Telephone Encounter (Signed)
Summary: herpes outbreak   Patient requesting PCP refill her herpes medication, patient experiencing an outbreak. The last time it was filled was a year. Do not see medication on patient medication list.           Chief Complaint: requesting medication. Not noted on medication list. Symptoms: reports "herpes outbreak". No drainage, pain level 8. No other sx reported Frequency: yesterday  Pertinent Negatives: Patient denies no fever Disposition: '[]'$ ED /'[]'$ Urgent Care (no appt availability in office) / '[x]'$ Appointment(In office/virtual)/ '[]'$  [facility]/ '[]'$ Home Care/ '[]'$ Refused Recommended Disposition /'[]'$ [facility]/ '[]'$  Follow-up with PCP Additional Notes:    Requesting if appt can be VV due to patient has to work tomorrow and difficulty getting off of work. Reports she has seen PCP for this issue 1 year ago. Appt scheduled tomorrow at 9:10 am. Please advise.      Reason for Disposition  Genital area looks infected (e.g., draining sore, spreading redness)  Answer Assessment - Initial Assessment Questions 1. MAIN SYMPTOM: "What is the main symptom or problem?"  (e.g., penis discharge, vaginal discharge)     Vaginal pain level "8" "herpes outbreak" 2. ONSET: "When did sx  start?"     Yesterday  3. OTHER SYMPTOMS: "Do you have any other symptoms?" (e.g., fever, rash)     No drainage no fever, reports "herpes outbreak" no other sx reported. Requesting medication as she has received before.  Answer Assessment - Initial Assessment Questions 1. SYMPTOM: "What's the main symptom you're concerned about?" (e.g., rash, itching, swelling, dryness)     Reports "herpes outbreak" pain noted 2. LOCATION: "Where is the  na located?" (e.g., inside/outside, left/right)     na 3. ONSET: "When did the  sx  start?"     Yesterday  4. PAIN: "Is there any pain?" If Yes, ask: "How bad is it?" (Scale: 1-10; mild, moderate, severe)   -  MILD (1-3): Doesn't interfere with normal activities.     -  MODERATE (4-7): Interferes with normal activities (e.g., work or school) or awakens from sleep.     -  SEVERE (8-10): Excruciating pain, unable to do any normal activities.     Level 8 5. CAUSE: "What do you think is causing the symptoms?"     Herpes  6. OTHER SYMPTOMS: "Do you have any other symptoms?" (e.g., fever, vaginal bleeding, pain with urination)     Denies drainage no fever 7. PREGNANCY: "Is there any chance you are pregnant?" "When was your last menstrual period?"     na  Protocols used: STI Guideline Selection-A-AH, [place]var Symptoms-A-AH

## 2022-03-21 ENCOUNTER — Other Ambulatory Visit: Payer: Self-pay

## 2022-03-21 ENCOUNTER — Other Ambulatory Visit: Payer: Self-pay | Admitting: Emergency Medicine

## 2022-03-21 ENCOUNTER — Ambulatory Visit: Payer: Self-pay

## 2022-03-21 ENCOUNTER — Ambulatory Visit (INDEPENDENT_AMBULATORY_CARE_PROVIDER_SITE_OTHER): Payer: Self-pay | Admitting: Primary Care

## 2022-03-21 NOTE — Telephone Encounter (Signed)
Summary: Seeking Rx, has symptoms, missed appt this morning   Pt called requesting to speak to a nurse about her medication. She missed her appt this morning for a refill. Has an outbreak   Left message to call back.

## 2022-03-21 NOTE — Telephone Encounter (Signed)
3rd attempt, pt called, pt answers then immediately disconnects. Unable to reach patient after 3 attempts by [facility] NT, routing to the provider for resolution per protocol.

## 2022-03-21 NOTE — Telephone Encounter (Signed)
2nd attempt, Pt called x 2, pt answers and then disconnects.

## 2022-03-22 ENCOUNTER — Telehealth: Payer: Self-pay | Admitting: Family Medicine

## 2022-03-22 ENCOUNTER — Other Ambulatory Visit: Payer: Self-pay

## 2022-03-22 ENCOUNTER — Telehealth: Payer: Self-pay | Admitting: Physician Assistant

## 2022-03-22 ENCOUNTER — Encounter: Payer: Self-pay | Admitting: Family Medicine

## 2022-03-22 DIAGNOSIS — R509 Fever, unspecified: Secondary | ICD-10-CM

## 2022-03-22 DIAGNOSIS — B009 Herpesviral infection, unspecified: Secondary | ICD-10-CM

## 2022-03-22 DIAGNOSIS — N898 Other specified noninflammatory disorders of vagina: Secondary | ICD-10-CM

## 2022-03-22 DIAGNOSIS — M545 Low back pain, unspecified: Secondary | ICD-10-CM

## 2022-03-22 MED ORDER — VALACYCLOVIR HCL 500 MG PO TABS
500.0000 mg | ORAL_TABLET | Freq: Two times a day (BID) | ORAL | 0 refills | Status: AC
  Filled 2022-03-22: qty 6, 3d supply, fill #0

## 2022-03-22 NOTE — Progress Notes (Signed)
Virtual Visit Consent   [name], you are scheduled for a virtual visit with a [facility] provider today. Just as with appointments in the office, your consent must be obtained to participate. Your consent will be active for this visit and any virtual visit you may have with one of our providers in the next 365 days. If you have a MyChart account, a copy of this consent can be sent to you electronically.  As this is a virtual visit, video technology does not allow for your provider to perform a traditional examination. This may limit your provider's ability to fully assess your condition. If your provider identifies any concerns that need to be evaluated in person or the need to arrange testing (such as labs, EKG, etc.), we will make arrangements to do so. Although advances in technology are sophisticated, we cannot ensure that it will always work on either your end or our end. If the connection with a video visit is poor, the visit may have to be switched to a telephone visit. With either a video or telephone visit, we are not always able to ensure that we have a secure connection.  By engaging in this virtual visit, you consent to the provision of healthcare and authorize for your insurance to be billed (if applicable) for the services provided during this visit. Depending on your insurance coverage, you may receive a charge related to this service.  I need to obtain your verbal consent now. Are you willing to proceed with your visit today? ROSENA BARTLE has provided verbal consent on 03/22/2022 for a virtual visit (video or telephone). Perlie Mayo, NP  Date: 03/22/2022 11:00 AM  Virtual Visit via Video Note   I, Perlie Mayo, connected with  [name]  ([id number], 1970-12-01) on 03/22/22 at 11:00 AM EST by a video-enabled telemedicine application and verified that I am speaking with the correct person using two identifiers.  Location: Patient: Virtual Visit Location  Patient: Home Provider: Virtual Visit Location Provider: Home Office   I discussed the limitations of evaluation and management by telemedicine and the availability of in person appointments. The patient expressed understanding and agreed to proceed.    History of Present Illness: [name] is a 52 y.o. who identifies as a female who was assigned female at birth, and is being seen today for herpes outbreak. -is in process of switch PCP's having an outbreak- needs meds- prior PCP will not refill at this time.   Denies recent intercourse or risk of STI/STD. Did do an EV that reported different responses.  But patient clarified them during visit- reporting this is how her herpes present and will only get worse. Patient is tearful.   HPI: Vaginal Pain The patient's primary symptoms include genital lesions. The patient's pertinent negatives include no genital itching, genital odor, genital rash, missed menses, pelvic pain, vaginal bleeding or vaginal discharge. This is a chronic problem. The current episode started in the past 7 days. The problem occurs constantly. The problem has been gradually worsening. The pain is moderate. The problem affects both sides. She is not pregnant. Associated symptoms include dysuria. The symptoms are aggravated by urinating. She has tried nothing for the symptoms. The treatment provided no relief.    Problems:  Patient Active Problem List   Diagnosis Date Noted   Generalized anxiety disorder 11/24/2020   Insomnia due to other mental disorder 11/24/2020   Alcohol-induced mood disorder ([place]) 11/22/2020   PTSD (post-traumatic stress  disorder) 11/22/2020   Bipolar I disorder, most recent episode depressed ([place]) 11/22/2020   Essential hypertension, benign 08/28/2018   Seasonal allergies 08/28/2018   Herpes simplex infection 04/07/2018   Other tear of medial meniscus, current injury, left knee, subsequent encounter 03/27/2018   Fracture of tibial plateau,  closed 03/08/2011   GERD (gastroesophageal reflux disease) 03/08/2011   Bipolar disorder ([place]) 03/08/2011   OSA (obstructive sleep apnea) 03/08/2011   Obesity 03/08/2011   Nicotine dependence 03/08/2011    Allergies:  Allergies  Allergen Reactions   Aspirin Other (See Comments)    "chilhood allergy"   Banana Hives   Latex Hives   Penicillins Swelling    Has patient had a PCN reaction causing immediate rash, facial/tongue/throat swelling, SOB or lightheadedness with hypotension: unknown Has patient had a PCN reaction causing severe rash involving mucus membranes or skin necrosis: unknown Has patient had a PCN reaction that required hospitalization unknown Has patient had a PCN reaction occurring within the last 10 years: no If all of the above answers are "NO", then may proceed with Cephalosporin use.    Septra [Bactrim] Itching and Swelling   Shellfish Allergy Other (See Comments)    No "reaction" tested positive   Strawberry Extract Hives   Tape Other (See Comments)    Skin peels away if paper tape is left on for long period of time   Medications:  Current Outpatient Medications:    Cetirizine HCl 10 MG CAPS, Take 1 capsule (10 mg total) by mouth daily for 15 days., Disp: 15 capsule, Rfl: 0   chlordiazePOXIDE (LIBRIUM) 25 MG capsule, '50mg'$  PO TID x 1D, then 25-'50mg'$  PO BID X 1D, then 25-'50mg'$  PO QD X 1D, Disp: 10 capsule, Rfl: 0   chlordiazePOXIDE (LIBRIUM) 25 MG capsule, Take 2 caps by mouth three times a day for 1 Day , then 1-2 caps twice daily  for 1 Day , then 1-2 caps  daily  for 1 Day., Disp: 10 capsule, Rfl: 0   Diclofenac Sodium CR 100 MG 24 hr tablet, Take 1 tablet (100 mg total) by mouth daily. Start after ketorolac is finished., Disp: 30 tablet, Rfl: 2   dicyclomine (BENTYL) 20 MG tablet, Take 1 tablet (20 mg total) by mouth 2 (two) times daily., Disp: 20 tablet, Rfl: 0   famotidine (PEPCID) 20 MG tablet, Take 1 tablet (20 mg total) by mouth 2 (two) times daily., Disp: 60  tablet, Rfl: 3   ketorolac (TORADOL) 10 MG tablet, Take 1 tablet (10 mg total) by mouth 3 (three) times daily for 4 days as needed for pain., Disp: 12 tablet, Rfl: 0   meclizine (ANTIVERT) 25 MG tablet, Take 1 tablet (25 mg total) by mouth 3 (three) times daily as needed for dizziness., Disp: 30 tablet, Rfl: 0   ondansetron (ZOFRAN-ODT) 4 MG disintegrating tablet, Take 1 tablet (4 mg total) by mouth every 8 (eight) hours as needed for nausea or vomiting., Disp: 20 tablet, Rfl: 0   pantoprazole (PROTONIX) 20 MG tablet, Take 2 tablets (40 mg total) by mouth daily for 14 days., Disp: 28 tablet, Rfl: 0   prazosin (MINIPRESS) 1 MG capsule, Take 1 capsule (1 mg total) by mouth at bedtime., Disp: 30 capsule, Rfl: 1   predniSONE (DELTASONE) 10 MG tablet, Begin with 6 tabs on day 1, 5 tab on day 2, 4 tab on day 3, 3 tab on day 4, 2 tab on day 5, 1 tab on day 6-take with food, Disp: 21 tablet,  Rfl: 0  Observations/Objective: Patient is well-developed, well-nourished in no acute distress.  Tearful Head is normocephalic, atraumatic.  No labored breathing.  Speech is clear and coherent with logical content.  Patient is alert and oriented at baseline.    Assessment and Plan:   1. Herpes simplex infection  - valACYclovir (VALTREX) 500 MG tablet; Take 1 tablet (500 mg total) by mouth 2 (two) times daily for 3 days.  Dispense: 6 tablet; Refill: 0   -is in process of switch PCP's having an outbreak- needs meds- prior PCP will not refill at this time -advised to follow up in person if not improved -treating based off this visit- post clarifying EV responses   Reviewed side effects, risks and benefits of medication.     Patient acknowledged agreement and understanding of the plan.   Past Medical, Surgical, Social History, Allergies, and Medications have been Reviewed.   Follow Up Instructions: I discussed the assessment and treatment plan with the patient. The patient was provided an opportunity to  ask questions and all were answered. The patient agreed with the plan and demonstrated an understanding of the instructions.  A copy of instructions were sent to the patient via MyChart unless otherwise noted below.    The patient was advised to call back or seek an in-person evaluation if the symptoms worsen or if the condition fails to improve as anticipated.  Time:  I spent 10 minutes with the patient via telehealth technology discussing the above problems/concerns.    Perlie Mayo, NP

## 2022-03-22 NOTE — Telephone Encounter (Signed)
Returned pt call and made her that Sharyn Lull will not be able to fill her rx it has been over a year since she has been seen. Pt states she understands. 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 Pt states she didn't know how to do it. Walked pt through [employer]. Pt is scheduled a new pt appt with Dr. Margarita Rana for the end of the month

## 2022-03-22 NOTE — Patient Instructions (Signed)
[name], thank you for joining Perlie Mayo, NP for today's virtual visit.  While this provider is not your primary care provider (PCP), if your PCP is located in our provider database this encounter information will be shared with them immediately following your visit.   [facility] account gives you access to today's visit and all your visits, tests, and labs performed at [facility] " click here if you don't have a [facility] account or go to [email or web address]  Consent: (Patient) [name] provided verbal consent for this virtual visit at the beginning of the encounter.  Current Medications:  Current Outpatient Medications:    valACYclovir (VALTREX) 500 MG tablet, Take 1 tablet (500 mg total) by mouth 2 (two) times daily for 3 days., Disp: 6 tablet, Rfl: 0   Cetirizine HCl 10 MG CAPS, Take 1 capsule (10 mg total) by mouth daily for 15 days., Disp: 15 capsule, Rfl: 0   chlordiazePOXIDE (LIBRIUM) 25 MG capsule, '50mg'$  PO TID x 1D, then 25-'50mg'$  PO BID X 1D, then 25-'50mg'$  PO QD X 1D, Disp: 10 capsule, Rfl: 0   chlordiazePOXIDE (LIBRIUM) 25 MG capsule, Take 2 caps by mouth three times a day for 1 Day , then 1-2 caps twice daily  for 1 Day , then 1-2 caps  daily  for 1 Day., Disp: 10 capsule, Rfl: 0   Diclofenac Sodium CR 100 MG 24 hr tablet, Take 1 tablet (100 mg total) by mouth daily. Start after ketorolac is finished., Disp: 30 tablet, Rfl: 2   dicyclomine (BENTYL) 20 MG tablet, Take 1 tablet (20 mg total) by mouth 2 (two) times daily., Disp: 20 tablet, Rfl: 0   famotidine (PEPCID) 20 MG tablet, Take 1 tablet (20 mg total) by mouth 2 (two) times daily., Disp: 60 tablet, Rfl: 3   ketorolac (TORADOL) 10 MG tablet, Take 1 tablet (10 mg total) by mouth 3 (three) times daily for 4 days as needed for pain., Disp: 12 tablet, Rfl: 0   meclizine (ANTIVERT) 25 MG tablet, Take 1 tablet (25 mg total) by mouth 3 (three) times daily as needed for  dizziness., Disp: 30 tablet, Rfl: 0   ondansetron (ZOFRAN-ODT) 4 MG disintegrating tablet, Take 1 tablet (4 mg total) by mouth every 8 (eight) hours as needed for nausea or vomiting., Disp: 20 tablet, Rfl: 0   pantoprazole (PROTONIX) 20 MG tablet, Take 2 tablets (40 mg total) by mouth daily for 14 days., Disp: 28 tablet, Rfl: 0   prazosin (MINIPRESS) 1 MG capsule, Take 1 capsule (1 mg total) by mouth at bedtime., Disp: 30 capsule, Rfl: 1   predniSONE (DELTASONE) 10 MG tablet, Begin with 6 tabs on day 1, 5 tab on day 2, 4 tab on day 3, 3 tab on day 4, 2 tab on day 5, 1 tab on day 6-take with food, Disp: 21 tablet, Rfl: 0   Medications ordered in this encounter:  Meds ordered this encounter  Medications   valACYclovir (VALTREX) 500 MG tablet    Sig: Take 1 tablet (500 mg total) by mouth 2 (two) times daily for 3 days.    Dispense:  6 tablet    Refill:  0    Order Specific Question:   Supervising Provider    Answer:   Chase Picket [id number]     *If you need refills on other medications prior to your next appointment, please contact your pharmacy*  Follow-Up: Call back or seek an in-person evaluation  if the symptoms worsen or if the condition fails to improve as anticipated.  [facility]  [phone]  Other Instructions  Genital Herpes Genital herpes is a common sexually transmitted infection (STI) that is caused by a virus. The virus spreads from person to person through contact with a sore, infected saliva, or infected skin. The virus can cause itching, blisters, and sores around the genitals or rectum. During an outbreak of infection, symptoms may last for several days and then go away. However, the virus remains in the body, so more outbreaks may happen in the future. The time between outbreaks varies and can be from months to years. Genital herpes can affect anyone. It is particularly concerning for pregnant women because the virus can be passed to the baby during  delivery. Genital herpes is also a concern for people who have a weak disease-fighting system (immune system). What are the causes? This condition is caused by the herpes simplex virus, type 1 or type 2 (HSV-1 or HSV-2). The virus may spread through: Sexual contact with an infected person, including vaginal, anal, and oral sex. Contact with a herpes sore. The skin. This means that you can get herpes from an infected partner even if there are no blisters or sores present. Your partner may not know that he or she is infected. What increases the risk? You are more likely to develop this condition if: You have sex with many partners. You do not use latex or polyurethane condoms during sex. What are the signs or symptoms? Most people do not have symptoms or they have mild symptoms that may be mistaken for other skin problems. Symptoms may include: Small, red bumps near the genitals, rectum, or mouth. These bumps turn into blisters and then sores. Flu-like (influenza-like) symptoms, including: Fever. Body aches. Swollen lymph nodes. Headache. Painful urination. Pain and itching in the genital area or rectal area. Vaginal discharge. Tingling or shooting pain in the legs and buttocks. Generally, symptoms are more severe and last longer during the first (primary) outbreak. Influenza-like symptoms are also more common during the primary outbreak. How is this diagnosed? This condition may be diagnosed based on: A physical exam. Your medical history. Blood tests. A test of a fluid sample (culture) from an open sore. How is this treated? There is no cure for this condition, but treatment with antiviral medicines can do the following: Speed up healing and relieve symptoms. Help to reduce the spread of the virus to sexual partners. Limit the chance of future outbreaks, or make future outbreaks shorter. Lessen symptoms of future outbreaks. Your health care provider may also recommend  over-the-counter medicines to help with pain and itching. Follow these instructions at home: If you have an outbreak:  Keep the affected areas dry and clean. Avoid rubbing or touching blisters and sores. If you do touch blisters or sores: Wash your hands thoroughly with soap and water for at least 20 seconds. If soap and water are not available, use an alcohol-based hand sanitizer. Do not touch your eyes afterward. Sexual activity Do not have sexual contact during active outbreaks. Practice safe sex. Herpes can spread even if your partner does not have blisters or sores. Latex or polyurethane condoms and female condoms may help prevent the spread of the herpes virus. Managing pain and discomfort If directed, put ice on the painful area. To do this: Put ice in a plastic bag. Place a towel between your skin and the bag. Leave the ice on for 20  minutes, 2-3 times a day. Remove the ice if your skin turns bright red. This is very important. If you cannot feel pain, heat, or cold, you have a greater risk of damage to the area. If told, take a cool sitz bath to help relieve pain or itching. A sitz bath is a water bath that you take while sitting down in water that is deep enough to cover your hips and buttocks. General instructions Take over-the-counter and prescription medicines only as told by your health care provider. If you were prescribed an antiviral medicine, use it as told by your health care provider. Do not stop using the antiviral even if you start to feel better. Keep all follow-up visits. This is important. How is this prevented? Use condoms. Although you can get genital herpes during sexual contact even with the use of a condom, a condom can provide some protection. Avoid having multiple sexual partners. Talk with your sexual partner about any symptoms either of you may have. Also, talk with your partner about any history of STIs. Do not have sexual contact if you have active  symptoms of genital herpes. Contact a health care provider if: Your symptoms are not improving with medicine. Your symptoms return, or you have new symptoms. You have a fever. You have abdominal pain. You have redness, swelling, or pain in your eye. You notice new sores on other parts of your body. You have had herpes and you become pregnant or plan to become pregnant. Get help right away if: You have symptoms of viral meningitis. This is rare but may happen if the virus spreads to the brain. Symptoms may include: Severe headache or stiff neck. Muscle aches. Nausea and vomiting. Sensitivity to light. Summary Genital herpes is a common sexually transmitted infection (STI) that is caused by the herpes simplex virus, type 1 or type 2 (HSV-1 or HSV-2). These viruses are most often spread through sexual contact with an infected person. You are more likely to develop this condition if you have sex with many partners or you do not use condoms during sex. Most people do not have symptoms or have mild symptoms that may be mistaken for other skin problems. Symptoms occur as outbreaks that may happen months or years apart. There is no cure for this condition, but treatment with oral antiviral medicines can reduce symptoms, reduce the chance of spreading the virus to a partner, prevent future outbreaks, or shorten future outbreaks. This information is not intended to replace advice given to you by your health care provider. Make sure you discuss any questions you have with your health care provider. Document Revised: 11/24/2020 Document Reviewed: 11/24/2020 Elsevier Patient Education  [place].    If you have been instructed to have an in-person evaluation today at a local Urgent Care facility, please use the link below. It will take you to a list of all of our available [facility], including address, phone number and hours of operation. Please do not delay care.  [facility]  [facility]gent Cares  If you or a family member do not have a primary care provider, use the link below to schedule a visit and establish care. When you choose a [facility] primary care physician or advanced practice provider, you gain a long-term partner in health. Find a Primary Care Provider  Learn more about [facility]'s in-office and virtual care options: [facility]

## 2022-03-22 NOTE — Progress Notes (Signed)
Because you are having discolored vaginal discharge with back pain and fevers, I feel your condition warrants further evaluation and I recommend that you be seen in a face to face visit.   NOTE: There will be NO CHARGE for this eVisit   If you are having a true medical emergency please call 911.      For an urgent face to face visit, [facility] has eight urgent care centers for your convenience:   NEW!! [facility] [place] [facility] [place] Get Driving Directions [phone] [address] [place], [place] [place] [facility] [place] Get Driving Directions [phone] [place], [place] [address]   [facility]) Get Driving Directions [phone] [address], [place] [address]  [facility] [place] ([place] [facility]) Get Driving Directions [phone] [address] [place],  [place]  [address]  [facility] [facility] - at [address] [place] Get Driving Directions  [phone] [place],  [place] [address]   [facility] Get Driving Directions [phone] [place] [facility] [place], [place] [address]   [facility] Get Driving Directions  [phone] [address] [place], [place] [address]   [facility] at [place] Get Driving Directions [phone] [address], [place], [place] [address]  Your MyChart E-visit questionnaire answers were reviewed by a board certified advanced clinical practitioner to complete your personal care plan based on your specific symptoms.  Thank you for using e-Visits.    I have spent 5 minutes in review of e-visit questionnaire, review and updating patient chart, medical decision making and response to patient.   Mar Daring, PA-C

## 2022-03-29 ENCOUNTER — Other Ambulatory Visit: Payer: Self-pay

## 2022-04-04 ENCOUNTER — Encounter: Payer: Self-pay | Admitting: Family Medicine

## 2022-04-04 ENCOUNTER — Other Ambulatory Visit: Payer: Self-pay

## 2022-04-04 ENCOUNTER — Other Ambulatory Visit (HOSPITAL_COMMUNITY)
Admission: RE | Admit: 2022-04-04 | Discharge: 2022-04-04 | Disposition: A | Discharge status: Home / Self Care | Payer: Self-pay | Source: Ambulatory Visit | Attending: Family Medicine | Admitting: Family Medicine

## 2022-04-04 ENCOUNTER — Ambulatory Visit: Payer: Self-pay | Attending: Family Medicine | Admitting: Family Medicine

## 2022-04-04 VITALS — BP 145/85 | HR 81 | Temp 98.3°F | Ht 67.0 in | Wt 198.8 lb

## 2022-04-04 DIAGNOSIS — H9313 Tinnitus, bilateral: Secondary | ICD-10-CM | POA: Insufficient documentation

## 2022-04-04 DIAGNOSIS — R1030 Lower abdominal pain, unspecified: Secondary | ICD-10-CM | POA: Insufficient documentation

## 2022-04-04 DIAGNOSIS — N3000 Acute cystitis without hematuria: Secondary | ICD-10-CM | POA: Insufficient documentation

## 2022-04-04 DIAGNOSIS — R002 Palpitations: Secondary | ICD-10-CM | POA: Insufficient documentation

## 2022-04-04 DIAGNOSIS — Z1159 Encounter for screening for other viral diseases: Secondary | ICD-10-CM | POA: Insufficient documentation

## 2022-04-04 DIAGNOSIS — G8929 Other chronic pain: Secondary | ICD-10-CM | POA: Insufficient documentation

## 2022-04-04 DIAGNOSIS — G4730 Sleep apnea, unspecified: Secondary | ICD-10-CM | POA: Insufficient documentation

## 2022-04-04 DIAGNOSIS — H6690 Otitis media, unspecified, unspecified ear: Secondary | ICD-10-CM

## 2022-04-04 DIAGNOSIS — Z8619 Personal history of other infectious and parasitic diseases: Secondary | ICD-10-CM

## 2022-04-04 DIAGNOSIS — B009 Herpesviral infection, unspecified: Secondary | ICD-10-CM | POA: Insufficient documentation

## 2022-04-04 DIAGNOSIS — H6693 Otitis media, unspecified, bilateral: Secondary | ICD-10-CM | POA: Insufficient documentation

## 2022-04-04 DIAGNOSIS — N898 Other specified noninflammatory disorders of vagina: Secondary | ICD-10-CM | POA: Insufficient documentation

## 2022-04-04 DIAGNOSIS — F319 Bipolar disorder, unspecified: Secondary | ICD-10-CM | POA: Insufficient documentation

## 2022-04-04 DIAGNOSIS — B9689 Other specified bacterial agents as the cause of diseases classified elsewhere: Secondary | ICD-10-CM | POA: Insufficient documentation

## 2022-04-04 DIAGNOSIS — Z79899 Other long term (current) drug therapy: Secondary | ICD-10-CM | POA: Insufficient documentation

## 2022-04-04 DIAGNOSIS — Z1231 Encounter for screening mammogram for malignant neoplasm of breast: Secondary | ICD-10-CM | POA: Insufficient documentation

## 2022-04-04 DIAGNOSIS — R350 Frequency of micturition: Secondary | ICD-10-CM | POA: Insufficient documentation

## 2022-04-04 DIAGNOSIS — R42 Dizziness and giddiness: Secondary | ICD-10-CM | POA: Insufficient documentation

## 2022-04-04 LAB — POCT URINALYSIS DIP (CLINITEK)
Blood, UA: NEGATIVE
Glucose, UA: NEGATIVE mg/dL
Nitrite, UA: POSITIVE — AB
POC PROTEIN,UA: 100 — AB
Spec Grav, UA: 1.02 (ref 1.010–1.025)
Urobilinogen, UA: 1 E.U./dL
pH, UA: 6 (ref 5.0–8.0)

## 2022-04-04 MED ORDER — CETIRIZINE HCL 10 MG PO TABS
10.0000 mg | ORAL_TABLET | Freq: Every day | ORAL | 0 refills | Status: DC
  Filled 2022-04-04: qty 90, 90d supply, fill #0

## 2022-04-04 MED ORDER — DOXYCYCLINE HYCLATE 100 MG PO TABS
100.0000 mg | ORAL_TABLET | Freq: Two times a day (BID) | ORAL | 0 refills | Status: DC
  Filled 2022-04-04: qty 20, 10d supply, fill #0

## 2022-04-04 MED ORDER — MECLIZINE HCL 25 MG PO TABS
25.0000 mg | ORAL_TABLET | Freq: Three times a day (TID) | ORAL | 1 refills | Status: DC | PRN
  Filled 2022-04-04: qty 60, 20d supply, fill #0
  Filled 2022-08-16 – 2022-11-01 (×2): qty 60, 20d supply, fill #1

## 2022-04-04 MED ORDER — VALACYCLOVIR HCL 500 MG PO TABS
500.0000 mg | ORAL_TABLET | Freq: Every day | ORAL | 1 refills | Status: DC
  Filled 2022-04-04: qty 90, 90d supply, fill #0
  Filled 2022-08-16 – 2022-12-31 (×4): qty 90, 90d supply, fill #1

## 2022-04-04 NOTE — Progress Notes (Unsigned)
Having palpitations Lower abdominal pain  Hearing buzzing in ears.
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
## 2022-04-04 NOTE — Patient Instructions (Signed)
Tinnitus ?Tinnitus refers to hearing a sound when there is no actual source for that sound. This is often described as ringing in the ears. However, people with this condition may hear a variety of noises, in one ear or in both ears. ?The sounds of tinnitus can be soft, loud, or somewhere in between. Tinnitus can last for a few seconds or can be constant for days. It may go away without treatment and come back at various times. When tinnitus is constant or happens often, it can lead to other problems, such as trouble sleeping and trouble concentrating. ?Almost everyone experiences tinnitus at some point. Tinnitus is not the same as hearing loss. Tinnitus that is long-lasting (chronic) or comes back often (recurs) may require medical attention. ?What are the causes? ?The cause of tinnitus is often not known. In some cases, it can result from: ?Exposure to loud noises from machinery, music, or other sources. ?An object (foreign body) stuck in the ear. ?Earwax buildup. ?Drinking alcohol or caffeine. ?Taking certain medicines. ?Age-related hearing loss. ?It may also be caused by medical conditions such as: ?Ear or sinus infections. ?Heart diseases or high blood pressure. ?Allergies. ?M?ni?re's disease. ?Thyroid problems. ?Tumors. ?A weak, bulging blood vessel (aneurysm) near the ear. ?What increases the risk? ?The following factors may make you more likely to develop this condition: ?Exposure to loud noises. ?Age. Tinnitus is more likely in older individuals. ?Using alcohol or tobacco. ?What are the signs or symptoms? ?The main symptom of tinnitus is hearing a sound when there is no source for that sound. It may sound like: ?Buzzing. ?Sizzling. ?Ringing. ?Blowing air. ?Hissing. ?Whistling. ?Other sounds may include: ?Roaring. ?Running water. ?A musical note. ?Tapping. ?Humming. ?Symptoms may affect only one ear (unilateral) or both ears (bilateral). ?How is this diagnosed? ?Tinnitus is diagnosed based on your symptoms,  your medical history, and a physical exam. Your health care provider may do a thorough hearing test (audiologic exam) if your tinnitus: ?Is unilateral. ?Causes hearing difficulties. ?Lasts 6 months or longer. ?You may work with a health care provider who specializes in hearing disorders (audiologist). You may be asked questions about your symptoms and how they affect your daily life. You may have other tests done, such as: ?CT scan. ?MRI. ?An imaging test of how blood flows through your blood vessels (angiogram). ?How is this treated? ?Treating an underlying medical condition can sometimes make tinnitus go away. If your tinnitus continues, other treatments may include: ?Therapy and counseling to help you manage the stress of living with tinnitus. ?Sound generators to mask the tinnitus. These include: ?Tabletop sound machines that play relaxing sounds to help you fall asleep. ?Wearable devices that fit in your ear and play sounds or music. ?Acoustic neural stimulation. This involves using headphones to listen to music that contains an auditory signal. Over time, listening to this signal may change some pathways in your brain and make you less sensitive to tinnitus. This treatment is used for very severe cases when no other treatment is working. ?Using hearing aids or cochlear implants if your tinnitus is related to hearing loss. Hearing aids are worn in the outer ear. Cochlear implants are surgically placed in the inner ear. ?Follow these instructions at home: ?Managing symptoms ? ?  ? ?When possible, avoid being in loud places and being exposed to loud sounds. ?Wear hearing protection, such as earplugs, when you are exposed to loud noises. ?Use a white noise machine, a humidifier, or other devices to mask the sound of tinnitus. ?  Practice techniques for reducing stress, such as meditation, yoga, or deep breathing. Work with your health care provider if you need help with managing stress. ?Sleep with your head  slightly raised. This may reduce the impact of tinnitus. ?General instructions ?Do not use stimulants, such as nicotine, alcohol, or caffeine. Talk with your health care provider about other stimulants to avoid. Stimulants are substances that can make you feel alert and attentive by increasing certain activities in the body (such as heart rate and blood pressure). These substances may make tinnitus worse. ?Take over-the-counter and prescription medicines only as told by your health care provider. ?Try to get plenty of sleep each night. ?Keep all follow-up visits. This is important. ?Contact a health care provider if: ?Your tinnitus continues for 3 weeks or longer without stopping. ?You develop sudden hearing loss. ?Your symptoms get worse or do not get better with home care. ?You feel you are not able to manage the stress of living with tinnitus. ?Get help right away if: ?You develop tinnitus after a head injury. ?You have tinnitus along with any of the following: ?Dizziness. ?Nausea and vomiting. ?Loss of balance. ?Sudden, severe headache. ?Vision changes. ?Facial weakness or weakness of arms or legs. ?These symptoms may represent a serious problem that is an emergency. Do not wait to see if the symptoms will go away. Get medical help right away. Call your local emergency services (911 in the [place] Do not drive yourself to the hospital. ?Summary ?Tinnitus refers to hearing a sound when there is no actual source for that sound. This is often described as ringing in the ears. ?Symptoms may affect only one ear (unilateral) or both ears (bilateral). ?Use a white noise machine, a humidifier, or other devices to mask the sound of tinnitus. ?Do not use stimulants, such as nicotine, alcohol, or caffeine. These substances may make tinnitus worse. ?This information is not intended to replace advice given to you by your health care provider. Make sure you discuss any questions you have with your health care  provider. ?Document Revised: 01/25/2020 Document Reviewed: 01/25/2020 ?Elsevier Patient Education ? 2023 Elsevier Inc. ? ?

## 2022-04-05 ENCOUNTER — Other Ambulatory Visit: Payer: Self-pay

## 2022-04-05 ENCOUNTER — Encounter: Payer: Self-pay | Admitting: Family Medicine

## 2022-04-05 LAB — CMP14+EGFR
ALT: 19 IU/L (ref 0–32)
AST: 46 IU/L — ABNORMAL HIGH (ref 0–40)
Albumin/Globulin Ratio: 1.5 (ref 1.2–2.2)
Albumin: 4.5 g/dL (ref 3.8–4.9)
Alkaline Phosphatase: 113 IU/L (ref 44–121)
BUN/Creatinine Ratio: 12 (ref 9–23)
BUN: 9 mg/dL (ref 6–24)
Bilirubin Total: 0.3 mg/dL (ref 0.0–1.2)
CO2: 22 mmol/L (ref 20–29)
Calcium: 10 mg/dL (ref 8.7–10.2)
Chloride: 98 mmol/L (ref 96–106)
Creatinine, Ser: 0.73 mg/dL (ref 0.57–1.00)
Globulin, Total: 3 g/dL (ref 1.5–4.5)
Glucose: 106 mg/dL — ABNORMAL HIGH (ref 70–99)
Potassium: 3.8 mmol/L (ref 3.5–5.2)
Sodium: 138 mmol/L (ref 134–144)
Total Protein: 7.5 g/dL (ref 6.0–8.5)
eGFR: 99 mL/min/{1.73_m2} (ref >59)

## 2022-04-05 LAB — CERVICOVAGINAL ANCILLARY ONLY
Bacterial Vaginitis (gardnerella): POSITIVE — AB
Candida Glabrata: NEGATIVE
Candida Vaginitis: NEGATIVE
Chlamydia: NEGATIVE
Comment: NEGATIVE
Comment: NEGATIVE
Comment: NEGATIVE
Comment: NEGATIVE
Comment: NEGATIVE
Comment: NORMAL
Neisseria Gonorrhea: NEGATIVE
Trichomonas: NEGATIVE

## 2022-04-05 LAB — T4, FREE: Free T4: 1.27 ng/dL (ref 0.82–1.77)

## 2022-04-05 LAB — T3: T3, Total: 117 ng/dL (ref 71–180)

## 2022-04-05 LAB — TSH: TSH: 0.92 u[IU]/mL (ref 0.450–4.500)

## 2022-04-05 LAB — HCV INTERPRETATION

## 2022-04-05 LAB — HCV AB W REFLEX TO QUANT PCR: HCV Ab: NONREACTIVE

## 2022-04-05 MED ORDER — METRONIDAZOLE 0.75 % VA GEL
1.0000 | Freq: Every day | VAGINAL | 0 refills | Status: AC
  Filled 2022-04-05 – 2022-05-03 (×2): qty 70, 7d supply, fill #0

## 2022-04-05 MED ORDER — NITROFURANTOIN MONOHYD MACRO 100 MG PO CAPS
100.0000 mg | ORAL_CAPSULE | Freq: Two times a day (BID) | ORAL | 0 refills | Status: DC
  Filled 2022-04-05 – 2022-05-03 (×2): qty 10, 5d supply, fill #0

## 2022-04-11 ENCOUNTER — Other Ambulatory Visit: Payer: Self-pay

## 2022-04-12 ENCOUNTER — Other Ambulatory Visit: Payer: Self-pay

## 2022-05-03 ENCOUNTER — Telehealth: Payer: Self-pay | Admitting: Family Medicine

## 2022-05-03 ENCOUNTER — Other Ambulatory Visit: Payer: Self-pay | Admitting: Physician Assistant

## 2022-05-03 ENCOUNTER — Ambulatory Visit: Payer: Self-pay | Admitting: Physician Assistant

## 2022-05-03 ENCOUNTER — Telehealth (HOSPITAL_BASED_OUTPATIENT_CLINIC_OR_DEPARTMENT_OTHER): Payer: Self-pay | Admitting: Physician Assistant

## 2022-05-03 ENCOUNTER — Other Ambulatory Visit: Payer: Self-pay

## 2022-05-03 ENCOUNTER — Other Ambulatory Visit (HOSPITAL_COMMUNITY): Payer: Self-pay

## 2022-05-03 DIAGNOSIS — N920 Excessive and frequent menstruation with regular cycle: Secondary | ICD-10-CM

## 2022-05-03 DIAGNOSIS — L299 Pruritus, unspecified: Secondary | ICD-10-CM

## 2022-05-03 DIAGNOSIS — G4733 Obstructive sleep apnea (adult) (pediatric): Secondary | ICD-10-CM

## 2022-05-03 MED ORDER — LORATADINE 10 MG PO TABS
10.0000 mg | ORAL_TABLET | Freq: Every day | ORAL | 1 refills | Status: DC
  Filled 2022-05-03: qty 90, 90d supply, fill #0
  Filled 2022-08-16: qty 30, 30d supply, fill #1

## 2022-05-03 MED ORDER — MEGESTROL ACETATE 20 MG PO TABS
20.0000 mg | ORAL_TABLET | Freq: Every day | ORAL | 2 refills | Status: DC
  Filled 2022-05-03 – 2022-11-28 (×4): qty 30, 30d supply, fill #0
  Filled 2022-12-31: qty 30, 30d supply, fill #1

## 2022-05-03 MED ORDER — CETIRIZINE HCL 10 MG PO TABS
10.0000 mg | ORAL_TABLET | Freq: Every day | ORAL | 1 refills | Status: DC
  Filled 2022-05-03: qty 90, 90d supply, fill #0

## 2022-05-03 NOTE — Progress Notes (Signed)
Patient ID: [name], female   DOB: [birth year], 52 y.o.   MRN: [id number] Virtual Visit via Video Note  I connected with Christy Sartorius on 05/03/22 at  1:50 PM EST by a video enabled telemedicine application and verified that I am speaking with the correct person using two identifiers.  Location: Patient: home Provider: [facility] office   I discussed the limitations of evaluation and management by telemedicine and the availability of in person appointments. The patient expressed understanding and agreed to proceed.  History of Present Illness: Patient has had bleeding 3 times this month and last month.  The bleeding has not been heavy in general, but she does have a lot of cramping.  She is also having hot flashes.  She has known fibroids but unable to see gyn currently bc she can't afford it.    She would like to have a sleep study.  She has apneic episodes and snoring. She had a sleep study in 2019 and was put on CPAP but does not have machine now.   Also c/o generalized itching day and night but worse at night.  This has been going on for years.  No rash.  LFT essentially normal (AST=46) last month    Observations/Objective:  NAD.  Speech clear.  [place]/AT   Assessment and Plan: 1. Pruritic disorder - cetirizine (ZYRTEC) 10 MG tablet; Take 1 tablet (10 mg total) by mouth daily.  Dispense: 90 tablet; Refill: 1  2. OSA (obstructive sleep apnea) - Split night study; Future  3. Menorrhagia with regular cycle Will need to see gyn when she can given known fibroids - megestrol (MEGACE) 20 MG tablet; Take 1 tablet (20 mg total) by mouth daily for excess bleeding.  Dispense: 30 tablet; Refill: 2   Follow Up Instructions: 3 months with Dr Margarita Rana for chronic conditions   I discussed the assessment and treatment plan with the patient. The patient was provided an opportunity to ask questions and all were answered. The patient agreed with the plan and demonstrated an understanding of the  instructions.   The patient was advised to call back or seek an in-person evaluation if the symptoms worsen or if the condition fails to improve as anticipated.  I provided 13 minutes of non-face-to-face time during this encounter.   Freeman Caldron, PA-C

## 2022-05-03 NOTE — Telephone Encounter (Signed)
Pt stated she just had a MyChart visit with Ms. Freeman Caldron, and stated she prescribed her medication, cetirizine (ZYRTEC) 10 MG tablet. Pt stated she is already taking this and it is not helping. Pt is requesting an alternative.  Pt mentioned that at her last appointment with Dr. Margarita Rana, she was prescribed a vaginal cream and a pill (Pt doesn't know what the medication is for or its name). She stated she never picked up because she did not have transportation. Pt is asking if this can be resent for her to pick up.   [facility] [address], [place] [address]  Phone: [phone] Fax: [phone]  Hours: M-F 7:30a-6:00p    Please advise.

## 2022-05-03 NOTE — Telephone Encounter (Signed)
Patient states she has already picked up her medication(s).

## 2022-05-03 NOTE — Telephone Encounter (Signed)
Copied from [place] [id number]. Topic: General - Other >> May 03, 2022  2:40 PM Dominique A wrote: Reason for CRM: Pt would like a call back from PCP to discuss one of her medications.  Please advise   Pt stated she just had a MyChart visit with Ms. Freeman Caldron, and stated she prescribed her medication, cetirizine (ZYRTEC) 10 MG tablet. Pt stated she is already taking this and it is not helping. Pt is requesting an alternative.   Pt mentioned that at her last appointment with Dr. Margarita Rana, she was prescribed a vaginal cream and a pill (Pt doesn't know what the medication is for or its name). She stated she never picked up because she did not have transportation. Pt is asking if this can be resent for her to pick up.     [facility] [address] [place] [address]  Phone: [phone] Fax: [phone]  Hours: M-F 7:30a-6:00p      Please advise.

## 2022-05-08 ENCOUNTER — Other Ambulatory Visit: Payer: Self-pay

## 2022-05-11 ENCOUNTER — Other Ambulatory Visit: Payer: Self-pay

## 2022-05-23 ENCOUNTER — Ambulatory Visit: Payer: Self-pay | Admitting: Physician Assistant

## 2022-06-19 ENCOUNTER — Telehealth: Payer: Self-pay | Admitting: Pharmacist

## 2022-06-19 NOTE — Progress Notes (Signed)
Patient attempted to be outreached by Alex Perez, PharmD Candidate on 06/19/2022 to discuss hypertension. Voicemail box full, no message left.   Alex Perez, PharmD Candidate   Catie T. Micha Erck, PharmD, BCACP, CPP [facility] [phone]  

## 2022-06-27 ENCOUNTER — Ambulatory Visit: Payer: Self-pay | Admitting: Nurse Practitioner

## 2022-07-24 ENCOUNTER — Ambulatory Visit: Payer: Self-pay | Admitting: Nurse Practitioner

## 2022-07-31 ENCOUNTER — Telehealth: Payer: Self-pay

## 2022-07-31 NOTE — Progress Notes (Signed)
Patient attempted to be outreached by Sharman Cheek, PharmD Candidate on 07/31/22 to discuss hypertension. Pt is rescheduling appointment and unable to discuss medications at this time.    [facility], [place]

## 2022-08-01 ENCOUNTER — Ambulatory Visit: Payer: Self-pay | Attending: Family Medicine | Admitting: Family Medicine

## 2022-08-17 ENCOUNTER — Other Ambulatory Visit: Payer: Self-pay

## 2022-08-17 ENCOUNTER — Other Ambulatory Visit (HOSPITAL_COMMUNITY): Payer: Self-pay

## 2022-08-17 ENCOUNTER — Ambulatory Visit: Payer: Self-pay | Admitting: Nurse Practitioner

## 2022-08-20 ENCOUNTER — Other Ambulatory Visit: Payer: Self-pay

## 2022-08-20 ENCOUNTER — Other Ambulatory Visit (HOSPITAL_COMMUNITY): Payer: Self-pay

## 2022-08-23 ENCOUNTER — Other Ambulatory Visit: Payer: Self-pay

## 2022-08-28 ENCOUNTER — Other Ambulatory Visit (HOSPITAL_COMMUNITY): Payer: Self-pay

## 2022-09-12 ENCOUNTER — Ambulatory Visit: Payer: Self-pay | Admitting: Orthopaedic Surgery

## 2022-09-19 ENCOUNTER — Ambulatory Visit: Payer: Self-pay | Admitting: Physician Assistant

## 2022-10-01 ENCOUNTER — Ambulatory Visit: Payer: Self-pay | Admitting: Orthopaedic Surgery

## 2022-10-10 ENCOUNTER — Ambulatory Visit: Payer: 59 | Admitting: Physician Assistant

## 2022-10-11 ENCOUNTER — Ambulatory Visit: Payer: Self-pay | Admitting: Physician Assistant

## 2022-10-16 ENCOUNTER — Ambulatory Visit: Payer: Self-pay | Admitting: Physician Assistant

## 2022-11-01 ENCOUNTER — Other Ambulatory Visit: Payer: Self-pay

## 2022-11-01 ENCOUNTER — Encounter: Payer: Self-pay | Admitting: Family Medicine

## 2022-11-01 ENCOUNTER — Ambulatory Visit: Payer: Self-pay | Attending: Family Medicine | Admitting: Family Medicine

## 2022-11-01 ENCOUNTER — Other Ambulatory Visit (HOSPITAL_COMMUNITY): Payer: Self-pay

## 2022-11-01 DIAGNOSIS — F17210 Nicotine dependence, cigarettes, uncomplicated: Secondary | ICD-10-CM

## 2022-11-01 DIAGNOSIS — R070 Pain in throat: Secondary | ICD-10-CM

## 2022-11-01 DIAGNOSIS — R42 Dizziness and giddiness: Secondary | ICD-10-CM

## 2022-11-01 DIAGNOSIS — N951 Menopausal and female climacteric states: Secondary | ICD-10-CM

## 2022-11-01 DIAGNOSIS — H9203 Otalgia, bilateral: Secondary | ICD-10-CM

## 2022-11-01 DIAGNOSIS — F17209 Nicotine dependence, unspecified, with unspecified nicotine-induced disorders: Secondary | ICD-10-CM

## 2022-11-01 MED ORDER — VARENICLINE TARTRATE 1 MG PO TABS
1.0000 mg | ORAL_TABLET | Freq: Two times a day (BID) | ORAL | 0 refills | Status: DC
  Filled 2022-11-01: qty 180, 90d supply, fill #0
  Filled 2023-01-02: qty 60, 30d supply, fill #0

## 2022-11-01 MED ORDER — VENLAFAXINE HCL ER 75 MG PO CP24
75.0000 mg | ORAL_CAPSULE | Freq: Every day | ORAL | 0 refills | Status: DC
  Filled 2022-11-01: qty 90, 90d supply, fill #0

## 2022-11-01 MED ORDER — VARENICLINE TARTRATE (STARTER) 0.5 MG X 11 & 1 MG X 42 PO TBPK
ORAL_TABLET | ORAL | 0 refills | Status: DC
  Filled 2022-11-01: qty 53, 28d supply, fill #0

## 2022-11-01 NOTE — Patient Instructions (Signed)
 Managing Hot Flashes During Menopause You will learn what hot flashes/night sweats are, what causes them and what the treatment methods are. To view the content, go to this web address: [email or web address]  This video will expire on: 08/26/2024. If you need access to this video following this date, please reach out to the healthcare provider who assigned it to you. This information is not intended to replace advice given to you by your health care provider. Make sure you discuss any questions you have with your health care provider. Elsevier Patient Education  2024 [employer].

## 2022-11-01 NOTE — Progress Notes (Signed)
Virtual Visit via Video Note  I connected with [name], on 11/01/2022 at 10:38 AM by video enabled telemedicine device and verified that I am speaking with the correct person using two identifiers.   Consent: I discussed the limitations, risks, security and privacy concerns of performing an evaluation and management service by telemedicine and the availability of in person appointments. I also discussed with the patient that there may be a patient responsible charge related to this service. The patient expressed understanding and agreed to proceed.   Location of Patient: Home  Location of Provider: Home Office   Persons participating in Telemedicine visit: [name] Dr. Alvis Lemmings     History of Present Illness: [name] is a 52 y.o. year old female  with a history of vertigo.   Discussed the use of AI scribe software for clinical note transcription with the patient, who gave verbal consent to proceed.  [name], a patient with a history of vertigo and menopause, presents with persistent discomfort in her ears and throat. She describes a sensation of pain and itching in her throat, likening it to a piece of glass, which causes her to wake up coughing and choking at night. This discomfort also occurs during the day but is worse at night. She also reports pain in both ears, more severe in the right ear, and a pain behind her right ear. She has tried various medications for these symptoms, including two prescriptions for itching and a nasal spray, but reports no relief. She also mentions that she cannot take ibuprofen, Aleve, or anything with aspirin due to pancreatitis.  In addition to the ear and throat discomfort, [name] reports vertigo that has caused her to fall and affects her ability to work. She has been taking meclizine for this condition, but reports no improvement.  [name] also reports severe menopausal symptoms, particularly hot flashes that occur  both during the day and at night. She describes waking up pouring sweat despite having the air conditioning and two fans on. She also mentions that she has not had a menstrual cycle in four months.  [name] is a smoker and is trying to quit. She has tried Chantix in the past, which she reports was effective, but she stopped due to the cost. She also has a family history of cancer and heart disease.      Past Medical History:  Diagnosis Date   Alcoholism (HCC)    Allergy    seasonal   Anxiety    Arthritis    Asthma    Blood transfusion    1989 at [place]   Bronchitis    Chronic bipolar disorder (HCC)    Chronic kidney disease    Depression    bipolar   GERD (gastroesophageal reflux disease)    Headache(784.0)    occasional   Hypertension    Mental disorder    bipolar   MRSA infection 2011   left lower leg   Pancreatitis    Pyelonephritis 10/2010   Recurrent upper respiratory infection (URI)    states she has not been to MD and feels like she has  bronchitis now- greenish sputum   Shortness of breath    sometimes with exertion   Sickle cell anemia (HCC)    sickle cell trait    Sickle cell trait (HCC)    Sleep apnea    CPAP   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  UTI (lower urinary tract infection) 10/2010   Allergies  Allergen Reactions   Aspirin Other (See Comments)    "chilhood allergy"   Banana Hives   Latex Hives   Penicillins Swelling    Has patient had a PCN reaction causing immediate rash, facial/tongue/throat swelling, SOB or lightheadedness with hypotension: unknown Has patient had a PCN reaction causing severe rash involving mucus membranes or skin necrosis: unknown Has patient had a PCN reaction that required hospitalization unknown Has patient had a PCN reaction occurring within the last 10 years: no If all of the above answers are "NO", then may proceed with Cephalosporin use.    Septra [Bactrim] Itching and Swelling   Shellfish Allergy  Other (See Comments)    No "reaction" tested positive   Strawberry Extract Hives   Tape Other (See Comments)    Skin peels away if paper tape is left on for long period of time    Current Outpatient Medications on File Prior to Visit  Medication Sig Dispense Refill   doxycycline (VIBRA-TABS) 100 MG tablet Take 1 tablet (100 mg total) by mouth 2 (two) times daily. 20 tablet 0   loratadine (CLARITIN) 10 MG tablet Take 1 tablet (10 mg total) by mouth daily. as needed for itching 90 tablet 1   meclizine (ANTIVERT) 25 MG tablet Take 1 tablet (25 mg total) by mouth 3 (three) times daily as needed for dizziness. 60 tablet 1   megestrol (MEGACE) 20 MG tablet Take 1 tablet (20 mg total) by mouth daily for excess bleeding. 30 tablet 2   nitrofurantoin, macrocrystal-monohydrate, (MACROBID) 100 MG capsule Take 1 capsule (100 mg total) by mouth 2 (two) times daily. 10 capsule 0   pantoprazole (PROTONIX) 20 MG tablet Take 2 tablets (40 mg total) by mouth daily for 14 days. 28 tablet 0   valACYclovir (VALTREX) 500 MG tablet Take 1 tablet (500 mg total) by mouth daily. For herpes suppression 90 tablet 1   [DISCONTINUED] amLODipine (NORVASC) 10 MG tablet Take 1 tablet daily 90 tablet 1   [DISCONTINUED] fluticasone (FLONASE) 50 MCG/ACT nasal spray Place 1-2 sprays into both nostrils daily. 16 g 0   [DISCONTINUED] hydrochlorothiazide (HYDRODIURIL) 25 MG tablet Take 1 tablet (25 mg total) by mouth daily. 90 tablet 0   [DISCONTINUED] QUEtiapine (SEROQUEL) 50 MG tablet Take 1 tablet (50 mg total) at bedtime for 6 days then continue taking 2 tablets (100 mg total) at bedtime. 30 tablet 1   [DISCONTINUED] venlafaxine XR (EFFEXOR-XR) 37.5 MG 24 hr capsule Take 1 capsule (37.5 mg total) by mouth daily. 30 capsule 1   No current facility-administered medications on file prior to visit.    ROS: See HPI  Observations/Objective: Awake, alert, oriented x3 Not in acute distress Normal mood      Latest Ref Rng &  Units 04/04/2022    3:20 PM 10/29/2021   12:27 PM 06/27/2020    6:56 AM  CMP  Glucose 70 - 99 mg/dL 960  93  454   BUN 6 - 24 mg/dL 9  7  6    Creatinine 0.57 - 1.00 mg/dL 0.98  1.19  1.47   Sodium 134 - 144 mmol/L 138  144  138   Potassium 3.5 - 5.2 mmol/L 3.8  3.7  3.8   Chloride 96 - 106 mmol/L 98  109  100   CO2 20 - 29 mmol/L 22  20  27    Calcium 8.7 - 10.2 mg/dL 82.9  8.6  9.6  Total Protein 6.0 - 8.5 g/dL 7.5  8.1  7.4   Total Bilirubin 0.0 - 1.2 mg/dL 0.3  0.6  0.5   Alkaline Phos 44 - 121 IU/L 113  93  74   AST 0 - 40 IU/L 46  33  50   ALT 0 - 32 IU/L 19  15  29      Lipid Panel     Component Value Date/Time   CHOL 170 06/10/2019 1119   TRIG 121 06/10/2019 1119   HDL 62 06/10/2019 1119   CHOLHDL 2.7 06/10/2019 1119   LDLCALC 87 06/10/2019 1119   LABVLDL 21 06/10/2019 1119    Lab Results  Component Value Date   HGBA1C 5.2 01/17/2018     Assessment and Plan:     Throat and Ear Discomfort Persistent throat itching and ear pain, worse on the right side. Symptoms not responding to current medications. Difficulty swallowing reported. -Refer to Ear, Nose, and Throat (ENT) specialist for further evaluation and possible laryngoscopy.  Vertigo Persistent despite Meclizine treatment. Patient reports falls due to imbalance. -Refer for vestibular rehabilitation therapy to help realign ear crystals and improve balance.  Menopause Severe hot flashes during the day and night, impacting quality of life. No menses for four months. Family history of cancer noted. -Start Effexor for hot flash management. -Consider Gynecology referral for further hormonal treatment discussion if Effexor is ineffective.  Tobacco Use Patient expresses desire to quit smoking. Previous use of Chantix was effective but discontinued due to cost. -Prescribe Chantix starter and continuation packs. -Encourage patient to continue efforts to quit smoking, especially if hormonal treatment for menopause  becomes necessary in the future.  Follow-up in six weeks to evaluate the effectiveness of Effexor for hot flash management.     Discussed risks and benefits of HRT vs non hormonal therapy.   Follow Up Instructions: 6 weeks   I discussed the assessment and treatment plan with the patient. The patient was provided an opportunity to ask questions and all were answered. The patient agreed with the plan and demonstrated an understanding of the instructions.   The patient was advised to call back or seek an in-person evaluation if the symptoms worsen or if the condition fails to improve as anticipated.     I provided 24 minutes total of Telehealth time during this encounter including median intraservice time, reviewing previous notes, investigations, ordering medications, medical decision making, coordinating care and patient verbalized understanding at the end of the visit.     Hoy Register, MD, FAAFP. [facility] [place], [place] [phone]   11/01/2022, 10:38 AM

## 2022-11-02 ENCOUNTER — Other Ambulatory Visit: Payer: Self-pay

## 2022-11-08 ENCOUNTER — Other Ambulatory Visit: Payer: Self-pay

## 2022-11-09 ENCOUNTER — Ambulatory Visit: Payer: Self-pay | Admitting: Physical Therapy

## 2022-11-15 ENCOUNTER — Ambulatory Visit: Payer: Self-pay | Attending: Family Medicine | Admitting: Physical Therapy

## 2022-11-15 ENCOUNTER — Encounter: Payer: Self-pay | Admitting: Physical Therapy

## 2022-11-15 ENCOUNTER — Ambulatory Visit: Payer: Self-pay | Admitting: Physician Assistant

## 2022-11-15 ENCOUNTER — Other Ambulatory Visit: Payer: Self-pay

## 2022-11-15 VITALS — BP 151/81 | HR 78

## 2022-11-15 DIAGNOSIS — R42 Dizziness and giddiness: Secondary | ICD-10-CM | POA: Insufficient documentation

## 2022-11-15 NOTE — Therapy (Signed)
OUTPATIENT PHYSICAL THERAPY VESTIBULAR EVALUATION     Patient Name: [name] MRN: [id number] DOB:[birth year], 52 y.o., female Today's Date: 11/16/2022  END OF SESSION:  PT End of Session - 11/15/22 1519     Visit Number 1    Number of Visits 1    Date for PT Re-Evaluation 11/15/22    Authorization Type None - [place]    PT Start Time 1520    PT Stop Time 1555    PT Time Calculation (min) 35 min    Equipment Utilized During Treatment Gait belt    Activity Tolerance Patient tolerated treatment well    Behavior During Therapy WFL for tasks assessed/performed             Past Medical History:  Diagnosis Date   Alcoholism (HCC)    Allergy    seasonal   Anxiety    Arthritis    Asthma    Blood transfusion    1989 at [place]   Bronchitis    Chronic bipolar disorder (HCC)    Chronic kidney disease    Depression    bipolar   GERD (gastroesophageal reflux disease)    Headache(784.0)    occasional   Hypertension    Mental disorder    bipolar   MRSA infection 2011   left lower leg   Pancreatitis    Pyelonephritis 10/2010   Recurrent upper respiratory infection (URI)    states she has not been to MD and feels like she has  bronchitis now- greenish sputum   Shortness of breath    sometimes with exertion   Sickle cell anemia (HCC)    sickle cell trait    Sickle cell trait (HCC)    Sleep apnea    CPAP   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   UTI (lower urinary tract infection) 10/2010   Past Surgical History:  Procedure Laterality Date   CESAREAN SECTION     KNEE ARTHROSCOPY  06/15/2011   Procedure: ARTHROSCOPY KNEE;  Surgeon: Budd Palmer, MD;  Location: [facility];  Service: Orthopedics;  Laterality: Left;  LEFT KNEE SCOPE WITH LYSIS OF ADHESIONS AND MANIPULATION   KNEE ARTHROSCOPY WITH MEDIAL MENISECTOMY Left 03/27/2018   Procedure: LEFT KNEE ARTHROSCOPY WITH PARTIAL MEDIAL MENISCECTOMY;  Surgeon: Kathryne Hitch, MD;  Location: [facility];  Service: Orthopedics;  Laterality: Left;   ORIF TIBIA PLATEAU  03/08/2011   Procedure: OPEN REDUCTION INTERNAL FIXATION (ORIF) TIBIAL PLATEAU;  Surgeon: Budd Palmer;  Location: [facility];  Service: Orthopedics;  Laterality: Left;   TUBAL LIGATION     Patient Active Problem List   Diagnosis Date Noted   Generalized anxiety disorder 11/24/2020   Insomnia due to other mental disorder 11/24/2020   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   PTSD (post-traumatic stress disorder) 11/22/2020   Bipolar I disorder, most recent episode depressed (HCC) 11/22/2020   Essential hypertension, benign 08/28/2018   Seasonal allergies 08/28/2018   Herpes simplex infection 04/07/2018   Other tear of medial meniscus, current injury, left knee, subsequent encounter 03/27/2018   Fracture of tibial plateau, closed 03/08/2011   GERD (gastroesophageal reflux disease) 03/08/2011   Bipolar disorder (HCC) 03/08/2011   OSA (obstructive sleep apnea) 03/08/2011   Obesity 03/08/2011   Nicotine dependence 03/08/2011    PCP: Hoy Register, MD REFERRING PROVIDER: Hoy Register, MD  REFERRING DIAG: R42 (ICD-10-CM) - Vertigo  THERAPY DIAG:  Dizziness and giddiness - Plan: PT  plan of care cert/re-cert  ONSET DATE: 11/01/2022 (referral date)  Rationale for Evaluation and Treatment: Rehabilitation  SUBJECTIVE:   SUBJECTIVE STATEMENT: Patient reports that she has bilateral buzzing in her ears that has been going on for the last few years. Patient reports that she has pain behind her R ear to her throat. Patient reports that she has intermittent episodes of dizziness when bending over, standing doing dishes, and showering. She gets hot flashes and has to sit down to resolve her dizziness. She is afraid of getting so dizzy she will fall over. Patient reports that she had 2 falls, when walking down the stairs and she got dizzy and she felt her L leg gave out. Patient reports no known history  of diabetes. She reports that she checks her BP when she is dizzy and thinks her BP may 170/85. Patient also reports that she feels like she will get heart palpitations intermittently as well. Denies falls/near falls. Patient scheduled to see ENT on October 1. Reports not taking meclizine at this time as it was not helping her symptoms. Patient reports current low levels of baseline dizziness that persist througout session; denies vertigo but reports intermittent "double vision."  Pt accompanied by: self  PERTINENT HISTORY: nicotine dependence, hypertension, bipolar  PAIN:  Are you having pain? Yes: NPRS scale: 6/10 Pain location: both ears  Pain description: achy Aggravating factors: no Relieving factors: none  PRECAUTIONS: Fall  RED FLAGS: None   WEIGHT BEARING RESTRICTIONS: No  FALLS: Has patient fallen in last 6 months? Yes. Number of falls 2 - reports being able to get up by self, states her leg feels like it is giving out  LIVING ENVIRONMENT: Lives with: lives with their partner Lives in: House/apartment Stairs: No Has following equipment at home: None  PLOF: Independent  PATIENT GOALS: "Whatever is causing this vertigo/dizziness try to function better."   OBJECTIVE:   DIAGNOSTIC FINDINGS: No relevant recent imaging  COGNITION: Overall cognitive status: Within functional limits for tasks assessed   SENSATION: WFL  Cervical ROM:    Active A/PROM (deg) eval  Flexion WFL  Extension WFL  Right lateral flexion WFL  Left lateral flexion 80% mild stiffness  Right rotation 80% mild stiffness  Left rotation WFL  (Blank rows = not tested   PATIENT SURVEYS:  FOTO 48  VESTIBULAR ASSESSMENT:  GENERAL OBSERVATION: glasses needing glasses intermittently to see far away   SYMPTOM BEHAVIOR: Subjective history: A few years ago - started with ear itching and then they would drain Non-Vestibular symptoms: changes in hearing, diplopia, neck pain, headaches, tinnitus,  nausea/vomiting, and migraine symptoms Type of dizziness: Imbalance (Disequilibrium), Lightheadedness/Faint, "Funny feeling in the head", and "Swimmyheaded"  Frequency: 2-3 days a week  Duration: 2-3 minutes  Aggravating factors: No known aggravating factors  Relieving factors: no known relieving factors, closing eyes, and rest  Progression of symptoms: unchanged  OCULOMOTOR EXAM: Ocular Alignment:  possibly very slight exotropia of L eye but not worsened with test of skew  Ocular ROM: No Limitations  Spontaneous Nystagmus: absent  Gaze-Induced Nystagmus: absent  Smooth Pursuits: intact  Saccades:  intact but reports seeing double of pen when looking down  Convergence/Divergence: 7 cm   VESTIBULAR - OCULAR REFLEX:   Slow VOR: Normal - reports minor dizziness when performed   VOR Cancellation: Normal - reports minor dizziness with testing   Head-Impulse Test: HIT Right: negative HIT Left: negative - WFL   Dynamic Visual Acuity: Static: 7 Dynamic: 8 Read better with  dynamic movement, double vision reported at baseline   VESTIBULAR TREATMENT:                                                                                                    Initial Eval only   PATIENT EDUCATION: Education details: POC, examination findings, next steps Person educated: Patient Education method: Explanation Education comprehension: verbalized understanding  HOME EXERCISE PROGRAM:  Not indicated at this time  GOALS: Not indicated at this time  ASSESSMENT:  CLINICAL IMPRESSION: Patient is a 52 y.o. female who was seen today for physical therapy evaluation and treatment for dizziness with PMH of bipolar, nicotine dependence, and HCC. Patient is not being picked up for physical therapy at this time due to reports of double vision intermittently throughout session without clear saccadic eye corrections or other clear oculomotor or vestibular deficits to explain reports. Given fact double vision can  be red flag recommended patient follow up with PCP and consider neuropthomology/new head imaging to explain deficits. Patient in agreement with plan.   OBJECTIVE IMPAIRMENTS: decreased balance, dizziness, and double vision .   ACTIVITY LIMITATIONS: bending, stairs, and reach over head  PARTICIPATION LIMITATIONS: community activity and occupation  PERSONAL FACTORS: Past/current experiences and 3+ comorbidities: see above  are also affecting patient's functional outcome.   CLINICAL DECISION MAKING: Evolving/moderate complexity  EVALUATION COMPLEXITY: Moderate   PLAN:  PT not indicated at this time given red flags; recommend patient follow up with PCP for next steps (neuropthomology versus imaging etc)   Carmelia Bake, PT, DPT 11/16/2022, 7:26 AM

## 2022-11-16 ENCOUNTER — Encounter: Payer: Self-pay | Admitting: Physical Therapy

## 2022-11-23 ENCOUNTER — Other Ambulatory Visit: Payer: Self-pay

## 2022-11-26 ENCOUNTER — Ambulatory Visit: Payer: Self-pay

## 2022-11-26 NOTE — Telephone Encounter (Signed)
Chief Complaint: Vaginal bleeding  Symptoms: vaginal bleed severe abdominal and lower back pain, passing clots Frequency: constant since 9//16/24 Pertinent Negatives: Patient denies feeling weak, lightheaded and nausea Disposition: [x] ED /[] Urgent Care (no appt availability in office) / [] Appointment(In office/virtual)/ []  [place]/ [] Home Care/ [] Refused Recommended Disposition /[] [place] [facility]/ []  Follow-up with PCP Additional Notes: Patient states she hasn't had a menstrual period for 5 months and she started to bleed on 11/19/22 for the first time. Patient reports the vaginal bleeding is heavy soaking through more than 2 pads in a hour and passing large blood clots the size of a quarter. Patient reports lower back pain and abdominal pain 10/10 on pain scale right now. Patient states she does not think she is pregnant because she had her tubes tied. Patient also reported that she was told she had fibroids but never follow-up due to not having insurance at the time. Care advice given and patient stated she would see if she can get transportation to the ED. Asked if she would like me to call EMS for transport patient stated no she would call if she can't get a ride.  Summary: Menstraul Cramps   Pt is calling in because she missed her menstrual cycle for 5 months. Pt says she's had her cycle for about a week now and she is experiencing more extreme cramps both in her stomach and her back. Pt says the cramps in her back are so bad sometimes it hurts to wipe.     Reason for Disposition  SEVERE vaginal bleeding (e.g., soaking 2 pads or tampons per hour and present 2 or more hours; 1 menstrual cup every 2 hours)  Answer Assessment - Initial Assessment Questions 1. LOCATION: "Where does it hurt?"      Lower back and abdominal 2. ONSET: "When did this episode of pain begin?"       11/19/22 3. SEVERITY: "How bad is the pain?" "Are you missing school or work because of the pain?"   (e.g., Scale 1-10; mild, moderate, or severe)   - MILD (1-3): Doesn't interfere with normal activities, lasting 1 to 2 days.    - MODERATE (4-7): Interferes with normal activities (missing work or school), lasting 2 to 3 days, some associated GI symptoms.    - SEVERE (8-10): Excruciating pain, lasting 2-7 days, associated GI symptoms, pain radiating into thighs and back.     10/10 4. VAGINAL BLEEDING: "Describe the bleeding that you are having." "How much bleeding is there?"    - SPOTTING: spotting, or pinkish / brownish mucous discharge; does not fill panty liner or pad    - MILD:  less than 1 pad / hour; less than patient's usual menstrual bleeding   - MODERATE: 1-2 pads / hour; 1 menstrual cup every 6 hours; small-medium blood clots (e.g., pea, grape, small coin)   - SEVERE: soaking 2 or more pads/hour for 2 or more hours; 1 menstrual cup every 2 hours; bleeding not contained by pads or continuous red blood from vagina; large blood clots (e.g., golf ball, large coin)      Severe with clots the size of a quarter  5. MENSTRUAL HISTORY:  "When did this menstrual period begin?", "Is this a normal period for you?"       11/19/22, this is my first period in 5 months 6. LMP:  "When did your last menstrual period begin?"     unsure 7. OTHER SYMPTOMS: "Do you have any other symptoms?" (e.g.,  back pain, diarrhea, dizzy or lightheaded, fever, urination pain, vaginal discharge, vomiting)     Back pain  8. PREGNANCY: "Is there any chance you are pregnant?" (e.g., unprotected intercourse, missed birth control pill, broken condom)     No, my tubes are tried  Protocols used: Abdominal Pain - Menstrual Cramps-A-AH

## 2022-11-27 NOTE — Telephone Encounter (Signed)
VV scheduled to 10/25/ 2024

## 2022-11-28 ENCOUNTER — Other Ambulatory Visit: Payer: Self-pay

## 2022-11-28 ENCOUNTER — Telehealth (HOSPITAL_BASED_OUTPATIENT_CLINIC_OR_DEPARTMENT_OTHER): Payer: Self-pay | Admitting: Family Medicine

## 2022-11-28 ENCOUNTER — Other Ambulatory Visit: Payer: Self-pay | Admitting: Family Medicine

## 2022-11-28 DIAGNOSIS — M545 Low back pain, unspecified: Secondary | ICD-10-CM

## 2022-11-28 DIAGNOSIS — Z1231 Encounter for screening mammogram for malignant neoplasm of breast: Secondary | ICD-10-CM

## 2022-11-28 DIAGNOSIS — D259 Leiomyoma of uterus, unspecified: Secondary | ICD-10-CM

## 2022-11-28 MED ORDER — TIZANIDINE HCL 4 MG PO TABS
4.0000 mg | ORAL_TABLET | Freq: Three times a day (TID) | ORAL | 1 refills | Status: DC | PRN
  Filled 2022-11-28: qty 60, 20d supply, fill #0
  Filled 2022-12-31: qty 60, 20d supply, fill #1

## 2022-11-28 NOTE — Progress Notes (Signed)
Virtual Visit via Telephone Note  I connected with [name], on 11/28/2022 at 8:43 AM by telephone and verified that I am speaking with the correct person using two identifiers.   Consent: I discussed the limitations, risks, security and privacy concerns of performing an evaluation and management service by telephone and the availability of in person appointments. I also discussed with the patient that there may be a patient responsible charge related to this service. The patient expressed understanding and agreed to proceed.   Location of Patient: Home  Location of Provider: Clinic   Persons participating in Telemedicine visit: [name] Dr. Alvis Lemmings     History of Present Illness: [name] is a 52 y.o. year old female with history of vertigo.   Discussed the use of AI scribe software for clinical note transcription with the patient, who gave verbal consent to proceed.  She presents with irregular menstrual bleeding and severe abdominal and back pain. She reports not having a menstrual cycle for five months, followed by intermittent spotting last week, and then a full cycle. The bleeding was initially heavy with clots, but has since reduced to minimal bleeding with clots. She describes the blood as 'dry' and continues to pass clots. Accompanying the bleeding is severe abdominal and back pain, similar to what she experienced with previous menstrual cycles. The pain is so severe that it interferes with her ability to move and perform daily activities, including work. She has tried various over-the-counter pain medications, but many exacerbate her pancreatitis. She has also tried back patches and heating pads, but the relief is minimal.      CT Abdomen/Pelvis from 10/2021: IMPRESSION: 1. No acute process in the abdomen or pelvis. 2. Fatty infiltration of the liver. 3. Uterine fibroids.   Past Medical History:  Diagnosis Date   Alcoholism (HCC)    Allergy     seasonal   Anxiety    Arthritis    Asthma    Blood transfusion    1989 at [facility]   Bronchitis    Chronic bipolar disorder (HCC)    Chronic kidney disease    Depression    bipolar   GERD (gastroesophageal reflux disease)    Headache(784.0)    occasional   Hypertension    Mental disorder    bipolar   MRSA infection 2011   left lower leg   Pancreatitis    Pyelonephritis 10/2010   Recurrent upper respiratory infection (URI)    states she has not been to MD and feels like she has  bronchitis now- greenish sputum   Shortness of breath    sometimes with exertion   Sickle cell anemia (HCC)    sickle cell trait    Sickle cell trait (HCC)    Sleep apnea    CPAP   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   UTI (lower urinary tract infection) 10/2010   Allergies  Allergen Reactions   Aspirin Other (See Comments)    "chilhood allergy"   Banana Hives   Latex Hives   Penicillins Swelling    Has patient had a PCN reaction causing immediate rash, facial/tongue/throat swelling, SOB or lightheadedness with hypotension: unknown Has patient had a PCN reaction causing severe rash involving mucus membranes or skin necrosis: unknown Has patient had a PCN reaction that required hospitalization unknown Has patient had a PCN reaction occurring within the last 10 years: no If all of the above answers are "NO",  then may proceed with Cephalosporin use.    Septra [Bactrim] Itching and Swelling   Shellfish Allergy Other (See Comments)    No "reaction" tested positive   Strawberry Extract Hives   Tape Other (See Comments)    Skin peels away if paper tape is left on for long period of time    Current Outpatient Medications on File Prior to Visit  Medication Sig Dispense Refill   doxycycline (VIBRA-TABS) 100 MG tablet Take 1 tablet (100 mg total) by mouth 2 (two) times daily. 20 tablet 0   loratadine (CLARITIN) 10 MG tablet Take 1 tablet (10 mg total) by mouth daily. as needed for  itching 90 tablet 1   meclizine (ANTIVERT) 25 MG tablet Take 1 tablet (25 mg total) by mouth 3 (three) times daily as needed for dizziness. 60 tablet 1   megestrol (MEGACE) 20 MG tablet Take 1 tablet (20 mg total) by mouth daily for excess bleeding. 30 tablet 2   nitrofurantoin, macrocrystal-monohydrate, (MACROBID) 100 MG capsule Take 1 capsule (100 mg total) by mouth 2 (two) times daily. 10 capsule 0   pantoprazole (PROTONIX) 20 MG tablet Take 2 tablets (40 mg total) by mouth daily for 14 days. 28 tablet 0   valACYclovir (VALTREX) 500 MG tablet Take 1 tablet (500 mg total) by mouth daily. For herpes suppression 90 tablet 1   varenicline (CHANTIX CONTINUING MONTH PAK) 1 MG tablet Take 1 tablet (1 mg total) by mouth 2 (two) times daily. 180 tablet 0   Varenicline Tartrate, Starter, (CHANTIX STARTING MONTH PAK) 0.5 MG X 11 & 1 MG X 42 TBPK Take as directed 53 each 0   venlafaxine XR (EFFEXOR XR) 75 MG 24 hr capsule Take 1 capsule (75 mg total) by mouth daily with breakfast. For hot flashes 90 capsule 0   [DISCONTINUED] amLODipine (NORVASC) 10 MG tablet Take 1 tablet daily 90 tablet 1   [DISCONTINUED] fluticasone (FLONASE) 50 MCG/ACT nasal spray Place 1-2 sprays into both nostrils daily. 16 g 0   [DISCONTINUED] hydrochlorothiazide (HYDRODIURIL) 25 MG tablet Take 1 tablet (25 mg total) by mouth daily. 90 tablet 0   [DISCONTINUED] QUEtiapine (SEROQUEL) 50 MG tablet Take 1 tablet (50 mg total) at bedtime for 6 days then continue taking 2 tablets (100 mg total) at bedtime. 30 tablet 1   No current facility-administered medications on file prior to visit.    ROS: See HPI  Observations/Objective: Awake, alert, oriented x3 Not in acute distress Normal mood      Latest Ref Rng & Units 04/04/2022    3:20 PM 10/29/2021   12:27 PM 06/27/2020    6:56 AM  CMP  Glucose 70 - 99 mg/dL 182  93  993   BUN 6 - 24 mg/dL 9  7  6    Creatinine 0.57 - 1.00 mg/dL 7.16  9.67  8.93   Sodium 134 - 144 mmol/L 138   144  138   Potassium 3.5 - 5.2 mmol/L 3.8  3.7  3.8   Chloride 96 - 106 mmol/L 98  109  100   CO2 20 - 29 mmol/L 22  20  27    Calcium 8.7 - 10.2 mg/dL 81.0  8.6  9.6   Total Protein 6.0 - 8.5 g/dL 7.5  8.1  7.4   Total Bilirubin 0.0 - 1.2 mg/dL 0.3  0.6  0.5   Alkaline Phos 44 - 121 IU/L 113  93  74   AST 0 - 40 IU/L 46  33  50   ALT 0 - 32 IU/L 19  15  29      Lipid Panel     Component Value Date/Time   CHOL 170 06/10/2019 1119   TRIG 121 06/10/2019 1119   HDL 62 06/10/2019 1119   CHOLHDL 2.7 06/10/2019 1119   LDLCALC 87 06/10/2019 1119   LABVLDL 21 06/10/2019 1119    Lab Results  Component Value Date   HGBA1C 5.2 01/17/2018    Assessment and Plan:     Uterine Fibroids Reports of irregular, heavy menstrual bleeding with clots and severe abdominal and back pain. History of fibroids confirmed by previous CT scan. Possible perimenopause. -Referral to Gynecology for further evaluation and management. -Advised to obtain paperwork for the [facility] [employer] discount at the front desk.   Back pain Severe back pain exacerbated by menstrual cycle and standing for long periods at work. Limited options for pain management due to history of OTC meds exacerbating her pancreatitis. -Prescribe muscle relaxant, cautioning about potential drowsiness. -Continue use of icy hot patches and heating pads for symptomatic relief.  General Health Maintenance -Coordinate with office for mammogram scheduling under the breast cancer, cervical cancer program.        Follow Up Instructions: Keep previously scheduled appointment   I discussed the assessment and treatment plan with the patient. The patient was provided an opportunity to ask questions and all were answered. The patient agreed with the plan and demonstrated an understanding of the instructions.   The patient was advised to call back or seek an in-person evaluation if the symptoms worsen or if the condition fails to improve as  anticipated.     I provided 12 minutes total of non-face-to-face time during this encounter.   Hoy Register, MD, FAAFP. [facility] [place], [place] [phone]   11/28/2022, 8:43 AM

## 2022-11-28 NOTE — Patient Instructions (Signed)
Acute Back Pain, Adult Acute back pain is sudden and usually short-lived. It is often caused by an injury to the muscles and tissues in the back. The injury may result from: A muscle, tendon, or ligament getting overstretched or torn. Ligaments are tissues that connect bones to each other. Lifting something improperly can cause a back strain. Wear and tear (degeneration) of the spinal disks. Spinal disks are circular tissue that provide cushioning between the bones of the spine (vertebrae). Twisting motions, such as while playing sports or doing yard work. A hit to the back. Arthritis. You may have a physical exam, lab tests, and imaging tests to find the cause of your pain. Acute back pain usually goes away with rest and home care. Follow these instructions at home: Managing pain, stiffness, and swelling Take over-the-counter and prescription medicines only as told by your health care provider. Treatment may include medicines for pain and inflammation that are taken by mouth or applied to the skin, or muscle relaxants. Your health care provider may recommend applying ice during the first 24-48 hours after your pain starts. To do this: Put ice in a plastic bag. Place a towel between your skin and the bag. Leave the ice on for 20 minutes, 2-3 times a day. Remove the ice if your skin turns bright red. This is very important. If you cannot feel pain, heat, or cold, you have a greater risk of damage to the area. If directed, apply heat to the affected area as often as told by your health care provider. Use the heat source that your health care provider recommends, such as a moist heat pack or a heating pad. Place a towel between your skin and the heat source. Leave the heat on for 20-30 minutes. Remove the heat if your skin turns bright red. This is especially important if you are unable to feel pain, heat, or cold. You have a greater risk of getting burned. Activity  Do not stay in bed. Staying in  bed for more than 1-2 days can delay your recovery. Sit up and stand up straight. Avoid leaning forward when you sit or hunching over when you stand. If you work at a desk, sit close to it so you do not need to lean over. Keep your chin tucked in. Keep your neck drawn back, and keep your elbows bent at a 90-degree angle (right angle). Sit high and close to the steering wheel when you drive. Add lower back (lumbar) support to your car seat, if needed. Take short walks on even surfaces as soon as you are able. Try to increase the length of time you walk each day. Do not sit, drive, or stand in one place for more than 30 minutes at a time. Sitting or standing for long periods of time can put stress on your back. Do not drive or use heavy machinery while taking prescription pain medicine. Use proper lifting techniques. When you bend and lift, use positions that put less stress on your back: [place] your knees. Keep the load close to your body. Avoid twisting. Exercise regularly as told by your health care provider. Exercising helps your back heal faster and helps prevent back injuries by keeping muscles strong and flexible. Work with a physical therapist to make a safe exercise program, as recommended by your health care provider. Do any exercises as told by your physical therapist. Lifestyle Maintain a healthy weight. Extra weight puts stress on your back and makes it difficult to have good  posture. Avoid activities or situations that make you feel anxious or stressed. Stress and anxiety increase muscle tension and can make back pain worse. Learn ways to manage anxiety and stress, such as through exercise. General instructions Sleep on a firm mattress in a comfortable position. Try lying on your side with your knees slightly bent. If you lie on your back, put a pillow under your knees. Keep your head and neck in a straight line with your spine (neutral position) when using electronic equipment like  smartphones or pads. To do this: Raise your smartphone or pad to look at it instead of bending your head or neck to look down. Put the smartphone or pad at the level of your face while looking at the screen. Follow your treatment plan as told by your health care provider. This may include: Cognitive or behavioral therapy. Acupuncture or massage therapy. Meditation or yoga. Contact a health care provider if: You have pain that is not relieved with rest or medicine. You have increasing pain going down into your legs or buttocks. Your pain does not improve after 2 weeks. You have pain at night. You lose weight without trying. You have a fever or chills. You develop nausea or vomiting. You develop abdominal pain. Get help right away if: You develop new bowel or bladder control problems. You have unusual weakness or numbness in your arms or legs. You feel faint. These symptoms may represent a serious problem that is an emergency. Do not wait to see if the symptoms will go away. Get medical help right away. Call your local emergency services (911 in the [place] Do not drive yourself to the hospital. Summary Acute back pain is sudden and usually short-lived. Use proper lifting techniques. When you bend and lift, use positions that put less stress on your back. Take over-the-counter and prescription medicines only as told by your health care provider, and apply heat or ice as told. This information is not intended to replace advice given to you by your health care provider. Make sure you discuss any questions you have with your health care provider. Document Revised: 05/13/2020 Document Reviewed: 05/13/2020 Elsevier Patient Education  2024 [employer].

## 2022-11-29 ENCOUNTER — Other Ambulatory Visit: Payer: Self-pay

## 2022-11-30 ENCOUNTER — Other Ambulatory Visit: Payer: Self-pay

## 2022-12-06 ENCOUNTER — Other Ambulatory Visit (INDEPENDENT_AMBULATORY_CARE_PROVIDER_SITE_OTHER): Payer: Self-pay

## 2022-12-06 ENCOUNTER — Ambulatory Visit (INDEPENDENT_AMBULATORY_CARE_PROVIDER_SITE_OTHER): Payer: Self-pay | Admitting: Physician Assistant

## 2022-12-06 DIAGNOSIS — G8929 Other chronic pain: Secondary | ICD-10-CM | POA: Diagnosis not present

## 2022-12-06 DIAGNOSIS — M7062 Trochanteric bursitis, left hip: Secondary | ICD-10-CM | POA: Diagnosis not present

## 2022-12-06 DIAGNOSIS — M25562 Pain in left knee: Secondary | ICD-10-CM | POA: Diagnosis not present
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
## 2022-12-07 ENCOUNTER — Other Ambulatory Visit: Payer: Self-pay | Admitting: Radiology

## 2022-12-07 ENCOUNTER — Ambulatory Visit: Payer: Self-pay | Admitting: Orthopedic Surgery

## 2022-12-07 DIAGNOSIS — M7062 Trochanteric bursitis, left hip: Secondary | ICD-10-CM

## 2022-12-18 ENCOUNTER — Telehealth (INDEPENDENT_AMBULATORY_CARE_PROVIDER_SITE_OTHER): Payer: Self-pay | Admitting: Family Medicine

## 2022-12-18 NOTE — Telephone Encounter (Signed)
Copied from CRM [id number]. Topic: Referral - Request for Referral >> Dec 11, 2022  4:02 PM Patsy Lager T wrote: Reason for CRM: OBGYN that she was referred to can't see her until mid Dec and she needs to be seen sooner  Has patient seen PCP for this complaint? Yes.   Referral for which specialty: Gynecologist Preferred provider/office: unknown Reason for referral: heavy cycles

## 2022-12-26 ENCOUNTER — Ambulatory Visit: Payer: Self-pay | Attending: Family Medicine | Admitting: Physical Therapy

## 2022-12-26 NOTE — Therapy (Deleted)
OUTPATIENT PHYSICAL THERAPY LOWER EXTREMITY EVALUATION   Patient Name: [name] MRN: [id number] DOB:August 08, 1970, 52 y.o., female Today's Date: 12/26/2022  END OF SESSION:   Past Medical History:  Diagnosis Date   Alcoholism (HCC)    Allergy    seasonal   Anxiety    Arthritis    Asthma    Blood transfusion    1989 at [place]   Bronchitis    Chronic bipolar disorder (HCC)    Chronic kidney disease    Depression    bipolar   GERD (gastroesophageal reflux disease)    Headache(784.0)    occasional   Hypertension    Mental disorder    bipolar   MRSA infection 2011   left lower leg   Pancreatitis    Pyelonephritis 10/2010   Recurrent upper respiratory infection (URI)    states she has not been to MD and feels like she has  bronchitis now- greenish sputum   Shortness of breath    sometimes with exertion   Sickle cell anemia (HCC)    sickle cell trait    Sickle cell trait (HCC)    Sleep apnea    CPAP   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   UTI (lower urinary tract infection) 10/2010   Past Surgical History:  Procedure Laterality Date   CESAREAN SECTION     KNEE ARTHROSCOPY  06/15/2011   Procedure: ARTHROSCOPY KNEE;  Surgeon: Budd Palmer, MD;  Location: [facility];  Service: Orthopedics;  Laterality: Left;  LEFT KNEE SCOPE WITH LYSIS OF ADHESIONS AND MANIPULATION   KNEE ARTHROSCOPY WITH MEDIAL MENISECTOMY Left 03/27/2018   Procedure: LEFT KNEE ARTHROSCOPY WITH PARTIAL MEDIAL MENISCECTOMY;  Surgeon: Kathryne Hitch, MD;  Location: [facility];  Service: Orthopedics;  Laterality: Left;   ORIF TIBIA PLATEAU  03/08/2011   Procedure: OPEN REDUCTION INTERNAL FIXATION (ORIF) TIBIAL PLATEAU;  Surgeon: Budd Palmer;  Location: [facility];  Service: Orthopedics;  Laterality: Left;   TUBAL LIGATION     Patient Active Problem List   Diagnosis Date Noted   Generalized anxiety disorder 11/24/2020   Insomnia due to other mental disorder  11/24/2020   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   PTSD (post-traumatic stress disorder) 11/22/2020   Bipolar I disorder, most recent episode depressed (HCC) 11/22/2020   Essential hypertension, benign 08/28/2018   Seasonal allergies 08/28/2018   Herpes simplex infection 04/07/2018   Other tear of medial meniscus, current injury, left knee, subsequent encounter 03/27/2018   Fracture of tibial plateau, closed 03/08/2011   GERD (gastroesophageal reflux disease) 03/08/2011   Bipolar disorder (HCC) 03/08/2011   OSA (obstructive sleep apnea) 03/08/2011   Obesity 03/08/2011   Nicotine dependence 03/08/2011    PCP: Hoy Register, MD   REFERRING PROVIDER: Richardean Canal PA  REFERRING DIAG: M70.62 (ICD-10-CM) - Trochanteric bursitis, left hip  THERAPY DIAG:  No diagnosis found.  Rationale for Evaluation and Treatment: Rehabilitation  ONSET DATE: ***  SUBJECTIVE:   SUBJECTIVE STATEMENT: ***  PERTINENT HISTORY: Mrs. Dellapenna comes in today with left knee pain and left hip pain. She has history of a tibial plateau fracture that she underwent an ORIF of by Dr. Carola Frost years ago. Since that time she had a knee arthroscopy on the left in 2020 by Dr. Magnus Ivan which showed grade III chondromalacia and a medial meniscal tear. She has had no new injury to the left knee. She does note that the left knee gives way on  her. No significant swelling. She is having no numbness tingling down either leg. She is having lateral left hip pain particularly whenever she lies on the hip at night. She is nondiabetic.  Plan: Will send her for IT band stretching left hip, quad strengthening bilateral knees, they will include home exercise program modalities.  They will follow-up with [place] in 6 weeks see how she is doing overall.  Questions were encouraged and answered at length.   PAIN:  Are you having pain? Yes: NPRS scale: ***/10 Pain location: *** Pain description: *** Aggravating factors:  *** Relieving factors: ***  PRECAUTIONS: {Therapy precautions:24002}  RED FLAGS: {PT Red Flags:29287}   WEIGHT BEARING RESTRICTIONS: No  FALLS:  Has patient fallen in last 6 months? {fallsyesno:27318}  LIVING ENVIRONMENT: Lives with: {[facility] lives with:25569::"lives with their family"} Lives in: {Lives in:25570} Stairs: {opstairs:27293} Has following equipment at home: {Assistive devices:23999}  OCCUPATION: ***  PLOF: Independent  PATIENT GOALS: ***  NEXT MD VISIT: ***  OBJECTIVE:  Note: Objective measures were completed at Evaluation unless otherwise noted.  DIAGNOSTIC FINDINGS: ***  PATIENT SURVEYS:  {rehab surveys:24030}  COGNITION: Overall cognitive status: Within functional limits for tasks assessed     SENSATION: {sensation:27233}  EDEMA:  {edema:24020}  MUSCLE LENGTH: Hamstrings: Right *** deg; Left *** deg Maisie Fus test: Right *** deg; Left *** deg  POSTURE: {posture:25561}  PALPATION: ***  LOWER EXTREMITY ROM:  {AROM/PROM:27142} ROM Right eval Left eval  Hip flexion    Hip extension    Hip abduction    Hip adduction    Hip internal rotation    Hip external rotation    Knee flexion    Knee extension    Ankle dorsiflexion    Ankle plantarflexion    Ankle inversion    Ankle eversion     (Blank rows = not tested)  LOWER EXTREMITY MMT:  MMT Right eval Left eval  Hip flexion    Hip extension    Hip abduction    Hip adduction    Hip internal rotation    Hip external rotation    Knee flexion    Knee extension    Ankle dorsiflexion    Ankle plantarflexion    Ankle inversion    Ankle eversion     (Blank rows = not tested)  LOWER EXTREMITY SPECIAL TESTS:  {LEspecialtests:26242}  FUNCTIONAL TESTS:  {Functional tests:24029}  GAIT: Distance walked: *** Assistive device utilized: {Assistive devices:23999} Level of assistance: {Levels of assistance:24026} Comments: ***   TODAY'S TREATMENT:                                                                                                                               DATE: ***    PATIENT EDUCATION:  Education details: *** Person educated: {Person educated:25204} Education method: {Education Method:25205} Education comprehension: {Education Comprehension:25206}  HOME EXERCISE PROGRAM: ***  ASSESSMENT:  CLINICAL IMPRESSION: Patient is a *** y.o. *** who was seen today for  physical therapy evaluation and treatment for ***.   OBJECTIVE IMPAIRMENTS: {opptimpairments:25111}.   ACTIVITY LIMITATIONS: {activitylimitations:27494}  PARTICIPATION LIMITATIONS: {participationrestrictions:25113}  PERSONAL FACTORS: {Personal factors:25162} are also affecting patient's functional outcome.   REHAB POTENTIAL: {rehabpotential:25112}  CLINICAL DECISION MAKING: {clinical decision making:25114}  EVALUATION COMPLEXITY: {Evaluation complexity:25115}   GOALS: Goals reviewed with patient? {yes/no:20286}  SHORT TERM GOALS: Target date: *** *** Baseline: Goal status: INITIAL  2.  *** Baseline:  Goal status: INITIAL  3.  *** Baseline:  Goal status: INITIAL  4.  *** Baseline:  Goal status: INITIAL  5.  *** Baseline:  Goal status: INITIAL  6.  *** Baseline:  Goal status: INITIAL  LONG TERM GOALS: Target date: ***  *** Baseline:  Goal status: INITIAL  2.  *** Baseline:  Goal status: INITIAL  3.  *** Baseline:  Goal status: INITIAL  4.  *** Baseline:  Goal status: INITIAL  5.  *** Baseline:  Goal status: INITIAL  6.  *** Baseline:  Goal status: INITIAL   PLAN:  PT FREQUENCY: {rehab frequency:25116}  PT DURATION: {rehab duration:25117}  PLANNED INTERVENTIONS: {rehab planned interventions:25118::"97110-Therapeutic exercises","97530- Therapeutic [phone]- Neuromuscular re-education","97535- Self JXBJ","47829- Manual therapy"}  PLAN FOR NEXT SESSION: ***   Mekayla Soman, PT 12/26/2022, 10:58 AM

## 2022-12-31 ENCOUNTER — Ambulatory Visit: Payer: Self-pay | Admitting: Orthopedic Surgery

## 2023-01-02 ENCOUNTER — Other Ambulatory Visit: Payer: Self-pay

## 2023-01-04 ENCOUNTER — Other Ambulatory Visit: Payer: Self-pay

## 2023-01-14 ENCOUNTER — Other Ambulatory Visit: Payer: Self-pay

## 2023-01-17 ENCOUNTER — Ambulatory Visit: Payer: Self-pay | Admitting: Physician Assistant

## 2023-02-12 ENCOUNTER — Telehealth: Payer: Self-pay | Admitting: Orthopedic Surgery

## 2023-02-12 NOTE — Telephone Encounter (Signed)
Called pt to RS appt provider will be in surgery due to complications moved pt appt to 12:30 no voicemail option for her please advise

## 2023-02-14 ENCOUNTER — Ambulatory Visit: Payer: Self-pay | Admitting: Obstetrics and Gynecology

## 2023-02-15 ENCOUNTER — Ambulatory Visit: Payer: Self-pay | Admitting: Orthopedic Surgery

## 2023-03-04 ENCOUNTER — Telehealth: Payer: Self-pay | Admitting: Family Medicine

## 2023-03-04 NOTE — Telephone Encounter (Signed)
Copied from CRM [id number]. Topic: General - Inquiry >> Feb 28, 2023  1:38 PM Clide Dales wrote: Patient called to speak with someone about financial assistance. Please advise.

## 2023-03-08 ENCOUNTER — Ambulatory Visit: Payer: Self-pay | Admitting: *Deleted

## 2023-03-08 NOTE — Telephone Encounter (Signed)
     Chief Complaint: Vaginal bleding since 02/15/23. Periods had stopped 4 months ago. Flow will be heavy then light, passes clots. Abdominal cramping. Could not afford to see GYN, They wanted over $300 out of pocket. Symptoms: Above Frequency: 02/15/23 Pertinent Negatives: Patient denies  Disposition: [] ED /[] Urgent Care (no appt availability in office) / [x] Appointment(In office/virtual)/ []  [facility]/ [] Home Care/ [] Refused Recommended Disposition /[]  Mobile Bus/ []  Follow-up with PCP Additional Notes: Agrees with appointment.  Reason for Disposition  [1] Periods with > 6 soaked pads or tampons per day AND [2] last > 7 days  Answer Assessment - Initial Assessment Questions 1. AMOUNT: Describe the bleeding that you are having.    - SPOTTING: spotting, or pinkish / brownish mucous discharge; does not fill panty liner or pad    - MILD:  less than 1 pad / hour; less than patient's usual menstrual bleeding   - MODERATE: 1-2 pads / hour; 1 menstrual cup every 6 hours; small-medium blood clots (e.g., pea, grape, small coin)   - SEVERE: soaking 2 or more pads/hour for 2 or more hours; 1 menstrual cup every 2 hours; bleeding not contained by pads or continuous red blood from vagina; large blood clots (e.g., golf ball, large coin)      Moderate 2. ONSET: When did the bleeding begin? Is it continuing now?     02/15/23 3. MENSTRUAL PERIOD: When was the last normal menstrual period? How is this different than your period?     4 months ago 4. REGULARITY: How regular are your periods?     Irregular 5. ABDOMEN PAIN: Do you have any pain? How bad is the pain?  (e.g., Scale 1-10; mild, moderate, or severe)   - MILD (1-3): doesn't interfere with normal activities, abdomen soft and not tender to touch    - MODERATE (4-7): interferes with normal activities or awakens from sleep, abdomen tender to touch    - SEVERE (8-10): excruciating pain, doubled over, unable  to do any normal activities      8-10 6. PREGNANCY: Is there any chance you are pregnant? When was your last menstrual period?     No 7. BREASTFEEDING: Are you breastfeeding?     No 8. HORMONE MEDICINES: Are you taking any hormone medicines, prescription or over-the-counter? (e.g., birth control pills, estrogen)     No 9. BLOOD THINNER MEDICINES: Do you take any blood thinners? (e.g., Coumadin / warfarin, Pradaxa / dabigatran, aspirin )     No 10. CAUSE: What do you think is causing the bleeding? (e.g., recent gyn surgery, recent gyn procedure; known bleeding disorder, cervical cancer, polycystic ovarian disease, fibroids)         Unsure 11. HEMODYNAMIC STATUS: Are you weak or feeling lightheaded? If Yes, ask: Can you stand and walk normally?        Tired 12. OTHER SYMPTOMS: What other symptoms are you having with the bleeding? (e.g., passed tissue, vaginal discharge, fever, menstrual-type cramps)       No  Protocols used: Vaginal Bleeding - Abnormal-A-AH

## 2023-03-08 NOTE — Telephone Encounter (Signed)
 Summary: Blood clots heavy period   Pt states she was unable to see GYN due to high cost for visit that was due upfront. PT states she has had her period for 20 days now, it has been going on since 02/15/2023. Pt states she has been passing clots and passed a big one this morning. Pt disconnected before being able to get her connected to NT. Attempted to call her back, unable to leave message.      Called patient [phone] to review sx of vaginal bleeding. No answer, LVMTCB [phone].

## 2023-03-11 ENCOUNTER — Other Ambulatory Visit: Payer: Self-pay

## 2023-03-11 ENCOUNTER — Encounter: Payer: Self-pay | Admitting: Family Medicine

## 2023-03-11 ENCOUNTER — Ambulatory Visit (INDEPENDENT_AMBULATORY_CARE_PROVIDER_SITE_OTHER): Payer: MEDICAID | Admitting: Physician Assistant

## 2023-03-11 ENCOUNTER — Encounter: Payer: Self-pay | Admitting: Physician Assistant

## 2023-03-11 VITALS — BP 132/82 | HR 76 | Temp 98.4°F | Resp 16 | Ht 67.5 in | Wt 202.0 lb

## 2023-03-11 DIAGNOSIS — L853 Xerosis cutis: Secondary | ICD-10-CM | POA: Diagnosis not present

## 2023-03-11 DIAGNOSIS — R232 Flushing: Secondary | ICD-10-CM

## 2023-03-11 DIAGNOSIS — N921 Excessive and frequent menstruation with irregular cycle: Secondary | ICD-10-CM | POA: Diagnosis not present

## 2023-03-11 MED ORDER — TRAMADOL HCL 50 MG PO TABS: 50.0000 mg | ORAL_TABLET | Freq: Three times a day (TID) | ORAL | 0 refills | Status: DC | PRN

## 2023-03-11 MED ORDER — TRAMADOL HCL 50 MG PO TABS: 50.0000 mg | ORAL_TABLET | Freq: Three times a day (TID) | ORAL | 0 refills | Status: AC | PRN

## 2023-03-11 MED ORDER — METHOCARBAMOL 500 MG PO TABS
1000.0000 mg | ORAL_TABLET | Freq: Four times a day (QID) | ORAL | 0 refills | Status: DC | PRN
  Filled 2023-03-11: qty 120, 15d supply, fill #0

## 2023-03-11 MED ORDER — MEGESTROL ACETATE 20 MG PO TABS
20.0000 mg | ORAL_TABLET | Freq: Every day | ORAL | 2 refills | Status: DC
  Filled 2023-03-11: qty 30, 30d supply, fill #0

## 2023-03-11 MED ORDER — TRIAMCINOLONE ACETONIDE 0.1 % EX CREA
1.0000 | TOPICAL_CREAM | Freq: Two times a day (BID) | CUTANEOUS | 0 refills | Status: DC
Start: 2023-03-11 — End: 2023-05-28
  Filled 2023-03-11: qty 30, 30d supply, fill #0

## 2023-03-11 MED ORDER — CETIRIZINE HCL 10 MG PO TABS
10.0000 mg | ORAL_TABLET | Freq: Every day | ORAL | 11 refills | Status: DC
Start: 2023-03-11 — End: 2023-05-22
  Filled 2023-03-11: qty 30, 30d supply, fill #0
  Filled 2023-05-16: qty 30, 30d supply, fill #1

## 2023-03-11 NOTE — Progress Notes (Signed)
 Patient ID: MEMORY HEINRICHS, female   DOB: Mar 18, 1970, 53 y.o.   MRN: [id number]   [name], is a 53 y.o. female  RDW:[id number]  [id number]  DOB - Jun 27, 1970  Chief Complaint  Patient presents with   abnormal vanginal bleeding been on menstrual cycle for 21 d       Subjective:   [name] is a 53 y.o. female here today after missing gyn appt in December bc she did not have financial assistance for the appt.  She is scheduled Wednesday of this week to get approval again.  She had not had a period in about 6 months and had stopped taking megace  at some point in the fall.  She started bleeding on 12/13 and has bled everyday since.  She has gone through 3 to 8 pads per day.  She has known uterine fibroids.  Severe cramps.  She does have hot flashes.  She only took megace  a few days and is now out. Passing clots.  Also c/o itchy rash on back and sometimes on legs from dry skin.  This has been going on for years.   No problems updated.  ALLERGIES: Allergies  Allergen Reactions   Aspirin  Other (See Comments)    chilhood allergy    Banana Hives   Latex Hives   Penicillins Swelling    Has patient had a PCN reaction causing immediate rash, facial/tongue/throat swelling, SOB or lightheadedness with hypotension: unknown Has patient had a PCN reaction causing severe rash involving mucus membranes or skin necrosis: unknown Has patient had a PCN reaction that required hospitalization unknown Has patient had a PCN reaction occurring within the last 10 years: no If all of the above answers are NO, then may proceed with Cephalosporin use.    Septra [Bactrim] Itching and Swelling   Shellfish Allergy  Other (See Comments)    No reaction tested positive   Strawberry Extract Hives   Tape Other (See Comments)    Skin peels away if paper tape is left on for long period of time    PAST MEDICAL HISTORY: Past Medical History:  Diagnosis Date   Alcoholism (HCC)    Allergy      seasonal   Anxiety    Arthritis    Asthma    Blood transfusion    1989 at [place]   Bronchitis    Chronic bipolar disorder (HCC)    Chronic kidney disease    Depression    bipolar   GERD (gastroesophageal reflux disease)    Headache(784.0)    occasional   Hypertension    Mental disorder    bipolar   MRSA infection 2011   left lower leg   Pancreatitis    Pyelonephritis 10/2010   Recurrent upper respiratory infection (URI)    states she has not been to MD and feels like she has  bronchitis now- greenish sputum   Shortness of breath    sometimes with exertion   Sickle cell anemia (HCC)    sickle cell trait    Sickle cell trait (HCC)    Sleep apnea    CPAP   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   UTI (lower urinary tract infection) 10/2010    MEDICATIONS AT HOME: Prior to Admission medications   Medication Sig Start Date End Date Taking? Authorizing Provider  cetirizine  (ZYRTEC ) 10 MG tablet Take 1 tablet (10 mg total) by mouth daily. 03/11/23  Yes Cort Dragoo, Jon HERO, PA-C  loratadine  (CLARITIN ) 10 MG  tablet Take 1 tablet (10 mg total) by mouth daily. as needed for itching 05/03/22  Yes Danton Jon HERO, PA-C  triamcinolone  cream (KENALOG ) 0.1 % Apply 1 Application topically 2 (two) times daily. 03/11/23  Yes Danton Jon HERO, PA-C  valACYclovir  (VALTREX ) 500 MG tablet Take 1 tablet (500 mg total) by mouth daily. For herpes suppression 04/04/22  Yes Newlin, Enobong, MD  venlafaxine  XR (EFFEXOR  XR) 75 MG 24 hr capsule Take 1 capsule (75 mg total) by mouth daily with breakfast. For hot flashes 11/01/22  Yes Newlin, Enobong, MD  doxycycline  (VIBRA -TABS) 100 MG tablet Take 1 tablet (100 mg total) by mouth 2 (two) times daily. Patient not taking: Reported on 03/11/2023 04/04/22   Newlin, Enobong, MD  meclizine  (ANTIVERT ) 25 MG tablet Take 1 tablet (25 mg total) by mouth 3 (three) times daily as needed for dizziness. 04/04/22   Newlin, Enobong, MD  megestrol  (MEGACE ) 20 MG tablet  Take 1 tablet (20 mg total) by mouth daily for excess bleeding. 03/11/23   Danton Jon HERO, PA-C  nitrofurantoin , macrocrystal-monohydrate, (MACROBID ) 100 MG capsule Take 1 capsule (100 mg total) by mouth 2 (two) times daily. Patient not taking: Reported on 03/11/2023 04/05/22   Newlin, Enobong, MD  pantoprazole  (PROTONIX ) 20 MG tablet Take 2 tablets (40 mg total) by mouth daily for 14 days. 10/29/21 11/27/21  Dreama Longs, MD  tiZANidine  (ZANAFLEX ) 4 MG tablet Take 1 tablet (4 mg total) by mouth every 8 (eight) hours as needed. Patient not taking: Reported on 03/11/2023 11/28/22   Newlin, Enobong, MD  varenicline  (CHANTIX  CONTINUING MONTH PAK) 1 MG tablet Take 1 tablet (1 mg total) by mouth 2 (two) times daily. Patient not taking: Reported on 03/11/2023 11/01/22   Newlin, Enobong, MD  Varenicline  Tartrate, Starter, (CHANTIX  STARTING MONTH PAK) 0.5 MG X 11 & 1 MG X 42 TBPK Take as directed Patient not taking: Reported on 03/11/2023 11/01/22   Delbert Clam, MD  amLODipine  (NORVASC ) 10 MG tablet Take 1 tablet daily 11/22/20 12/20/21  Celestia Rosaline SQUIBB, NP  fluticasone  (FLONASE ) 50 MCG/ACT nasal spray Place 1-2 sprays into both nostrils daily. 11/28/20 12/20/21  Wieters, Hallie C, PA-C  hydrochlorothiazide  (HYDRODIURIL ) 25 MG tablet Take 1 tablet (25 mg total) by mouth daily. 11/22/20 12/20/21  Celestia Rosaline SQUIBB, NP  QUEtiapine  (SEROQUEL ) 50 MG tablet Take 1 tablet (50 mg total) at bedtime for 6 days then continue taking 2 tablets (100 mg total) at bedtime. 11/24/20 12/20/21  Nwoko, Reginia E, PA    ROS: Neg HEENT Neg resp Neg cardiac Neg GI Neg GU Neg MS Neg psych Neg neuro  Objective:   Vitals:   03/11/23 1544 03/11/23 1554  BP: (!) 144/90 132/82  Pulse: 76   Resp: 16   Temp: 98.4 F (36.9 C)   TempSrc: Oral   SpO2: 95%   Weight: 202 lb (91.6 kg)   Height: 5' 7.5 (1.715 m)    Exam General appearance : Awake, alert, not in any distress. Speech Clear. Not toxic looking HEENT:  Atraumatic and Normocephalic Neck: Supple, no JVD. No cervical lymphadenopathy.  Chest: Good air entry bilaterally, CTAB.  No rales/rhonchi/wheezing CVS: S1 S2 regular, no murmurs.  Extremities: B/L Lower Ext shows no edema, both legs are warm to touch Neurology: Awake alert, and oriented X 3, CN II-XII intact, Non focal Skin: dry skin upper back  Data Review Lab Results  Component Value Date   HGBA1C 5.2 01/17/2018   HGBA1C 5.3 11/06/2016    Assessment &  Plan   1. Menorrhagia with irregular cycle (Primary) To ED if becomes severe.   - FSH/LH - TSH - megestrol  (MEGACE ) 20 MG tablet; Take 1 tablet (20 mg total) by mouth daily for excess bleeding.  Dispense: 30 tablet; Refill: 2 - CBC with Differential - Iron , TIBC and Ferritin Panel - Ambulatory referral to Gynecology Methocarbamol  and #10 tramadol  sent  2. Hot flashes - FSH/LH - Ambulatory referral to Gynecology  3. Dry skin - cetirizine  (ZYRTEC ) 10 MG tablet; Take 1 tablet (10 mg total) by mouth daily.  Dispense: 30 tablet; Refill: 11 - triamcinolone  cream (KENALOG ) 0.1 %; Apply 1 Application topically 2 (two) times daily.  Dispense: 30 g; Refill: 0    Return in about 4 months (around 07/09/2023) for PCP for chronic conditions.  The patient was given clear instructions to go to ER or return to medical center if symptoms don't improve, worsen or new problems develop. The patient verbalized understanding. The patient was told to call to get lab results if they haven't heard anything in the next week.      Jon Moores, PA-C [facility] [place], [place] [phone]   03/11/2023, 4:27 PM

## 2023-03-12 ENCOUNTER — Other Ambulatory Visit: Payer: Self-pay

## 2023-03-12 ENCOUNTER — Other Ambulatory Visit: Payer: Self-pay | Admitting: Family Medicine

## 2023-03-12 LAB — IRON,TIBC AND FERRITIN PANEL
Ferritin: 22 ng/mL (ref 15–150)
Iron Saturation: 8 % — CL (ref 15–55)
Iron: 33 ug/dL (ref 27–159)
Total Iron Binding Capacity: 411 ug/dL (ref 250–450)
UIBC: 378 ug/dL (ref 131–425)

## 2023-03-12 LAB — CBC WITH DIFFERENTIAL/PLATELET
Basophils Absolute: 0.1 10*3/uL (ref 0.0–0.2)
Basos: 1 %
EOS (ABSOLUTE): 0.1 10*3/uL (ref 0.0–0.4)
Eos: 1 %
Hematocrit: 38.8 % (ref 34.0–46.6)
Hemoglobin: 12.1 g/dL (ref 11.1–15.9)
Immature Grans (Abs): 0 10*3/uL (ref 0.0–0.1)
Immature Granulocytes: 0 %
Lymphocytes Absolute: 4.6 10*3/uL — ABNORMAL HIGH (ref 0.7–3.1)
Lymphs: 52 %
MCH: 26.9 pg (ref 26.6–33.0)
MCHC: 31.2 g/dL — ABNORMAL LOW (ref 31.5–35.7)
MCV: 86 fL (ref 79–97)
Monocytes Absolute: 0.5 10*3/uL (ref 0.1–0.9)
Monocytes: 5 %
Neutrophils Absolute: 3.6 10*3/uL (ref 1.4–7.0)
Neutrophils: 41 %
Platelets: 458 10*3/uL — ABNORMAL HIGH (ref 150–450)
RBC: 4.49 x10E6/uL (ref 3.77–5.28)
RDW: 18 % — ABNORMAL HIGH (ref 11.7–15.4)
WBC: 8.8 10*3/uL (ref 3.4–10.8)

## 2023-03-12 LAB — FSH/LH
FSH: 7.4 m[IU]/mL
LH: 2.6 m[IU]/mL

## 2023-03-12 LAB — TSH: TSH: 1.14 u[IU]/mL (ref 0.450–4.500)

## 2023-03-12 MED ORDER — TRAMADOL HCL 50 MG PO TABS
50.0000 mg | ORAL_TABLET | ORAL | 0 refills | Status: DC
  Filled 2023-03-12: qty 10, 5d supply, fill #0

## 2023-03-13 ENCOUNTER — Other Ambulatory Visit: Payer: Self-pay

## 2023-03-13 ENCOUNTER — Telehealth: Payer: Self-pay | Admitting: *Deleted

## 2023-03-14 NOTE — Telephone Encounter (Signed)
 error

## 2023-03-19 ENCOUNTER — Other Ambulatory Visit: Payer: Self-pay

## 2023-03-20 ENCOUNTER — Ambulatory Visit: Payer: MEDICAID | Admitting: Family Medicine

## 2023-03-22 ENCOUNTER — Ambulatory Visit: Payer: Self-pay | Admitting: *Deleted

## 2023-03-22 ENCOUNTER — Emergency Department (HOSPITAL_COMMUNITY): Admission: EM | Admit: 2023-03-22 | Discharge: 2023-03-22 | Discharge status: Against Medical Advice | Payer: MEDICAID

## 2023-03-22 ENCOUNTER — Ambulatory Visit: Payer: Self-pay | Admitting: Family Medicine

## 2023-03-22 NOTE — Telephone Encounter (Signed)
Chief Complaint: vaginal bleeding since 02/15/23 Symptoms: ABD pain, back pain, Frequency: since 02/15/23 Pertinent Negatives: Patient denies dizziness, denies SOB  Disposition: [x] ED /[] Urgent Care (no appt availability in office) / [] Appointment(In office/virtual)/ []  [facility]/ [] Home Care/ [] Refused Recommended Disposition /[] [facility]/ []  Follow-up with PCP  Additional Notes: Pt has not had a cycle in 5 months. Pt states no chance of pregnancy as she had her tubes tied in '91. Pt states that she is having abd pain, and back pain. Pt states she is passing clots, 3-4 per hour, ranging in size. Pt states that she is going through approx 26 pads in a day. Pt states she is unsure if she has a fever. States she feels achy all over, no thermometer at home. States cannot see current until March.  Advised to seek ED treatment.   Copied from CRM [id number]. Topic: Clinical - Red Word Triage >> Mar 22, 2023 11:59 AM Thomes Dinning wrote: Red Word that prompted transfer to Nurse Triage: Patient states she has been bleeding heavily for the last 32 days. She is passing blood clots as well. Reason for Disposition  SEVERE vaginal bleeding (e.g., soaking 2 pads or tampons per hour and present 2 or more hours; 1 menstrual cup every 2 hours)  Answer Assessment - Initial Assessment Questions 1. AMOUNT: "Describe the bleeding that you are having."    - SPOTTING: spotting, or pinkish / brownish mucous discharge; does not fill panty liner or pad    - MILD:  less than 1 pad / hour; less than patient's usual menstrual bleeding   - MODERATE: 1-2 pads / hour; 1 menstrual cup every 6 hours; small-medium blood clots (e.g., pea, grape, small coin)   - SEVERE: soaking 2 or more pads/hour for 2 or more hours; 1 menstrual cup every 2 hours; bleeding not contained by pads or continuous red blood from vagina; large blood clots (e.g., golf ball, large coin)      Pads 2.5 per day. Severe/large clots.  2.  ONSET: "When did the bleeding begin?" "Is it continuing now?"     02/15/2023 3. MENSTRUAL PERIOD: "When was the last normal menstrual period?" "How is this different than your period?"     About 5 months ago 4. REGULARITY: "How regular are your periods?"     Pt was starting what she believed to be menopause, was told last week she pre-menopause. 5. ABDOMEN PAIN: "Do you have any pain?" "How bad is the pain?"  (e.g., Scale 1-10; mild, moderate, or severe)   - MILD (1-3): doesn't interfere with normal activities, abdomen soft and not tender to touch    - MODERATE (4-7): interferes with normal activities or awakens from sleep, abdomen tender to touch    - SEVERE (8-10): excruciating pain, doubled over, unable to do any normal activities      10 6. PREGNANCY: "Is there any chance you are pregnant?" "When was your last menstrual period?"     Denies tubes tied 7. BREASTFEEDING: "Are you breastfeeding?"     denies 8. HORMONE MEDICINES: "Are you taking any hormone medicines, prescription or over-the-counter?" (e.g., birth control pills, estrogen)     denies 9. BLOOD THINNER MEDICINES: "Do you take any blood thinners?" (e.g., Coumadin / warfarin, Pradaxa / dabigatran, aspirin)     denies 10. CAUSE: "What do you think is causing the bleeding?" (e.g., recent gyn surgery, recent gyn procedure; known bleeding disorder, cervical cancer, polycystic ovarian disease, fibroids)  Hx of fibroids 11. HEMODYNAMIC STATUS: "Are you weak or feeling lightheaded?" If Yes, ask: "Can you stand and walk normally?"        Feels like my whole body is hurting, feels like I have been beat up 12. OTHER SYMPTOMS: "What other symptoms are you having with the bleeding?" (e.g., passed tissue, vaginal discharge, fever, menstrual-type cramps)       Cramping pain in abd and also a dull pain  Protocols used: Vaginal Bleeding - Abnormal-A-AH

## 2023-03-22 NOTE — Telephone Encounter (Signed)
Noted  

## 2023-03-22 NOTE — Telephone Encounter (Addendum)
  Chief Complaint: Very heavy vaginal bleeding with passage of large blood clots since Dec. 13, 2024.  Taking the medicine (Megace 20 mg) prescribed for me but it's not helping at all.   The OB-GYN referral given to me can't see me until March 2025.  I'm going through a 26 pad pack an hour.   I'm using 2-3 packs of pads a day with each one having 26 pads.   Symptoms: Dizziness, heavy vaginal bleeding, passing large blood clots and abd cramping, very weak and tired. Frequency: Since Dec. 13,2024.   Before that I didn't have a period for 5 months.   They told me I have fibroids. Pertinent Negatives: Patient denies the medication helping at all.  Disposition: [x] ED /[] Urgent Care (no appt availability in office) / [] Appointment(In office/virtual)/ []  [facility]/ [] Home Care/ [] Refused Recommended Disposition /[] [facility]/ []  Follow-up with PCP Additional Notes: I made pt an appt with Dr. Jonah Blue for 1/23/225 at 11:10 however she is going to go on to the ED now.    "I don't feel like anyone is doing anything about this bleeding".   "I can't keep going on like this".    "No one accepts Medicaid so I'm having to wait a long time to see the OB-GYN referral".    I let her know since she is feeling dizzy and so weak that going to the ED was fine.

## 2023-03-22 NOTE — ED Notes (Signed)
Called for vitals but received no response

## 2023-03-22 NOTE — Telephone Encounter (Signed)
Reason for Disposition  SEVERE vaginal bleeding (e.g., soaking 2 pads or tampons per hour and present 2 or more hours; 1 menstrual cup every 2 hours)  Answer Assessment - Initial Assessment Questions 1. AMOUNT: "Describe the bleeding that you are having."    - SPOTTING: spotting, or pinkish / brownish mucous discharge; does not fill panty liner or pad    - MILD:  less than 1 pad / hour; less than patient's usual menstrual bleeding   - MODERATE: 1-2 pads / hour; 1 menstrual cup every 6 hours; small-medium blood clots (e.g., pea, grape, small coin)   - SEVERE: soaking 2 or more pads/hour for 2 or more hours; 1 menstrual cup every 2 hours; bleeding not contained by pads or continuous red blood from vagina; large blood clots (e.g., golf ball, large coin)      Since Dec. I've been having heavy vaginal bleeding and clotting.  The medicines are not helping.   The medicine is not helping.    I'm trying to call the ED.   I'm bleeding heavily and having clots.   The OB-GYN can't see me until 05/12/2023.    I don't know what to do.   I've been prescribed this medicine last year and then 2 weeks ago.   No one has done a PAP smear or anything for me.   No one is doing anything for me.    2. ONSET: "When did the bleeding begin?" "Is it continuing now?"     Feb 15, 2023.   I called the OB-GYN I was referred to until March.    I don't want to sit in the ED for hours waiting to be seen. 3. MENSTRUAL PERIOD: "When was the last normal menstrual period?" "How is this different than your period?"     They are normally regular.   I have fibroids.    4. REGULARITY: "How regular are your periods?"     Used to be regular until I started bleeding Dec. 13, 2024. 5. ABDOMEN PAIN: "Do you have any pain?" "How bad is the pain?"  (e.g., Scale 1-10; mild, moderate, or severe)   - MILD (1-3): doesn't interfere with normal activities, abdomen soft and not tender to touch    - MODERATE (4-7): interferes with normal activities or  awakens from sleep, abdomen tender to touch    - SEVERE (8-10): excruciating pain, doubled over, unable to do any normal activities      Abd cramping real bad 6. PREGNANCY: "Is there any chance you are pregnant?" "When was your last menstrual period?"     No 7. BREASTFEEDING: "Are you breastfeeding?"     No My last child is 21 yrs old 8. HORMONE MEDICINES: "Are you taking any hormone medicines, prescription or over-the-counter?" (e.g., birth control pills, estrogen)     I'm on a medicine for the vaginal bleeding. It's megestrol 20 mg   I've been on this for the last 4-5 months. 9. BLOOD THINNER MEDICINES: "Do you take any blood thinners?" (e.g., Coumadin / warfarin, Pradaxa / dabigatran, aspirin)     No 10. CAUSE: "What do you think is causing the bleeding?" (e.g., recent gyn surgery, recent gyn procedure; known bleeding disorder, cervical cancer, polycystic ovarian disease, fibroids)         Fibroids in my uterus. I saw Dr. Alain Marion 11. HEMODYNAMIC STATUS: "Are you weak or feeling lightheaded?" If Yes, ask: "Can you stand and walk normally?"        I'm tired  all the time.  I'm dizzy and sleepy.   My whole body is sore. 12. OTHER SYMPTOMS: "What other symptoms are you having with the bleeding?" (e.g., passed tissue, vaginal discharge, fever, menstrual-type cramps)       Passing large blood clots.   I'm not anemic from my blood work.   That don't make since.  Protocols used: Vaginal Bleeding - Abnormal-A-AH

## 2023-03-26 ENCOUNTER — Encounter (HOSPITAL_BASED_OUTPATIENT_CLINIC_OR_DEPARTMENT_OTHER): Payer: Self-pay | Admitting: Emergency Medicine

## 2023-03-26 ENCOUNTER — Emergency Department (HOSPITAL_BASED_OUTPATIENT_CLINIC_OR_DEPARTMENT_OTHER): Payer: MEDICAID | Admitting: Radiology

## 2023-03-26 ENCOUNTER — Emergency Department (HOSPITAL_BASED_OUTPATIENT_CLINIC_OR_DEPARTMENT_OTHER): Payer: MEDICAID

## 2023-03-26 ENCOUNTER — Emergency Department (HOSPITAL_BASED_OUTPATIENT_CLINIC_OR_DEPARTMENT_OTHER)
Admission: EM | Admit: 2023-03-26 | Discharge: 2023-03-26 | Disposition: A | Discharge status: Home / Self Care | Payer: MEDICAID | Attending: Emergency Medicine | Admitting: Emergency Medicine

## 2023-03-26 ENCOUNTER — Other Ambulatory Visit: Payer: Self-pay

## 2023-03-26 DIAGNOSIS — R0981 Nasal congestion: Secondary | ICD-10-CM | POA: Insufficient documentation

## 2023-03-26 DIAGNOSIS — Z91040 Latex allergy status: Secondary | ICD-10-CM | POA: Insufficient documentation

## 2023-03-26 DIAGNOSIS — N939 Abnormal uterine and vaginal bleeding, unspecified: Secondary | ICD-10-CM | POA: Diagnosis present

## 2023-03-26 DIAGNOSIS — N946 Dysmenorrhea, unspecified: Secondary | ICD-10-CM | POA: Insufficient documentation

## 2023-03-26 DIAGNOSIS — Z20822 Contact with and (suspected) exposure to covid-19: Secondary | ICD-10-CM | POA: Insufficient documentation

## 2023-03-26 LAB — CBC WITH DIFFERENTIAL/PLATELET
Abs Immature Granulocytes: 0.01 10*3/uL (ref 0.00–0.07)
Basophils Absolute: 0 10*3/uL (ref 0.0–0.1)
Basophils Relative: 1 %
Eosinophils Absolute: 0.1 10*3/uL (ref 0.0–0.5)
Eosinophils Relative: 2 %
HCT: 38.3 % (ref 36.0–46.0)
Hemoglobin: 12.5 g/dL (ref 12.0–15.0)
Immature Granulocytes: 0 %
Lymphocytes Relative: 53 %
Lymphs Abs: 3 10*3/uL (ref 0.7–4.0)
MCH: 27.1 pg (ref 26.0–34.0)
MCHC: 32.6 g/dL (ref 30.0–36.0)
MCV: 82.9 fL (ref 80.0–100.0)
Monocytes Absolute: 0.3 10*3/uL (ref 0.1–1.0)
Monocytes Relative: 6 %
Neutro Abs: 2.1 10*3/uL (ref 1.7–7.7)
Neutrophils Relative %: 38 %
Platelets: 321 10*3/uL (ref 150–400)
RBC: 4.62 MIL/uL (ref 3.87–5.11)
RDW: 17.9 % — ABNORMAL HIGH (ref 11.5–15.5)
WBC: 5.5 10*3/uL (ref 4.0–10.5)
nRBC: 0 % (ref 0.0–0.2)

## 2023-03-26 LAB — RESP PANEL BY RT-PCR (RSV, FLU A&B, COVID)  RVPGX2
Influenza A by PCR: NEGATIVE
Influenza B by PCR: NEGATIVE
Resp Syncytial Virus by PCR: NEGATIVE
SARS Coronavirus 2 by RT PCR: NEGATIVE

## 2023-03-26 LAB — BASIC METABOLIC PANEL
Anion gap: 9 (ref 5–15)
BUN: 5 mg/dL — ABNORMAL LOW (ref 6–20)
CO2: 22 mmol/L (ref 22–32)
Calcium: 9 mg/dL (ref 8.9–10.3)
Chloride: 104 mmol/L (ref 98–111)
Creatinine, Ser: 0.61 mg/dL (ref 0.44–1.00)
GFR, Estimated: >60 mL/min (ref >60)
Glucose, Bld: 97 mg/dL (ref 70–99)
Potassium: 3.8 mmol/L (ref 3.5–5.1)
Sodium: 135 mmol/L (ref 135–145)

## 2023-03-26 LAB — URINALYSIS, W/ REFLEX TO CULTURE (INFECTION SUSPECTED)
Bilirubin Urine: NEGATIVE
Glucose, UA: NEGATIVE mg/dL
Ketones, ur: NEGATIVE mg/dL
Leukocytes,Ua: NEGATIVE
Nitrite: NEGATIVE
RBC / HPF: >50 RBC/hpf (ref 0–5)
Specific Gravity, Urine: 1.016 (ref 1.005–1.030)
pH: 6.5 (ref 5.0–8.0)

## 2023-03-26 LAB — HCG, SERUM, QUALITATIVE: Preg, Serum: NEGATIVE

## 2023-03-26 NOTE — ED Notes (Signed)
 RN reviewed discharge instructions with pt. Pt verbalized understanding and had no further questions. VSS upon discharge.  

## 2023-03-26 NOTE — Discharge Instructions (Addendum)
Megace: Take two tablets (40 mg) three times per day time three days,  then take two tablets (40 mg) two times per day time three days,  then take two tablets (40 mg)once per day   Follow-up with OB/GYN as soon as possible.  Return to the emergency room if you have any worsening symptoms.

## 2023-03-26 NOTE — ED Triage Notes (Signed)
States she has had heavy vaginal bleeding since 12/15. Seen at pcp for same but bleeding has continued. States she changes pad 9-10x an hour. Endorses headache, dizziness, and lightheaded.

## 2023-03-26 NOTE — ED Provider Notes (Addendum)
[place] EMERGENCY DEPARTMENT AT [facility] Provider Note   CSN: [id number] Arrival date & time: 03/26/23  1059     History  Chief Complaint  Patient presents with   Vaginal Bleeding    [name] is a 53 y.o. female.  Patient is a 53 year old female who presents with vaginal bleeding.  She states she has been having heavy menstrual bleeding since December 15.  She said prior to that she had not had a period in about 4 to 5 months.  She had similar heavy bleeding in February 2024.  She is on Megace.  She says she has been taking it regularly and it has not stopped the bleeding.  She initially saw PCP and she was referred to follow-up with OB/GYN but has not been able to get an appointment yet.  She does feel little bit lightheaded and fatigued.  She is also having some URI symptoms with runny nose congestion and coughing.  She has a little bit of pain in the center of her chest that is worse with coughing.  No other chest pain.  That is been going on about 4 days.       Home Medications Prior to Admission medications   Medication Sig Start Date End Date Taking? Authorizing Provider  cetirizine (ZYRTEC) 10 MG tablet Take 1 tablet (10 mg total) by mouth daily. 03/11/23   Anders Simmonds, PA-C  doxycycline (VIBRA-TABS) 100 MG tablet Take 1 tablet (100 mg total) by mouth 2 (two) times daily. Patient not taking: Reported on 03/11/2023 04/04/22   Hoy Register, MD  loratadine (CLARITIN) 10 MG tablet Take 1 tablet (10 mg total) by mouth daily. as needed for itching 05/03/22   Georgian Co M, PA-C  meclizine (ANTIVERT) 25 MG tablet Take 1 tablet (25 mg total) by mouth 3 (three) times daily as needed for dizziness. 04/04/22   Hoy Register, MD  megestrol (MEGACE) 20 MG tablet Take 1 tablet (20 mg total) by mouth daily for excess bleeding. 03/11/23   Anders Simmonds, PA-C  methocarbamol (ROBAXIN) 500 MG tablet Take 2 tablets (1,000 mg total) by mouth every 6 (six) hours as  needed for muscle spasms. 03/11/23   Anders Simmonds, PA-C  nitrofurantoin, macrocrystal-monohydrate, (MACROBID) 100 MG capsule Take 1 capsule (100 mg total) by mouth 2 (two) times daily. Patient not taking: Reported on 03/11/2023 04/05/22   Hoy Register, MD  pantoprazole (PROTONIX) 20 MG tablet Take 2 tablets (40 mg total) by mouth daily for 14 days. 10/29/21 11/27/21  Alvira Monday, MD  tiZANidine (ZANAFLEX) 4 MG tablet Take 1 tablet (4 mg total) by mouth every 8 (eight) hours as needed. Patient not taking: Reported on 03/11/2023 11/28/22   Hoy Register, MD  traMADol (ULTRAM) 50 MG tablet Take 1 tablet (50 mg total) by mouth every 8 hours as neededfor up to 5 days 03/11/23   Anders Simmonds, PA-C  triamcinolone cream (KENALOG) 0.1 % Apply 1 Application topically 2 (two) times daily. 03/11/23   Anders Simmonds, PA-C  valACYclovir (VALTREX) 500 MG tablet Take 1 tablet (500 mg total) by mouth daily. For herpes suppression 04/04/22   Hoy Register, MD  varenicline (CHANTIX CONTINUING MONTH PAK) 1 MG tablet Take 1 tablet (1 mg total) by mouth 2 (two) times daily. Patient not taking: Reported on 03/11/2023 11/01/22   Hoy Register, MD  Varenicline Tartrate, Starter, (CHANTIX STARTING MONTH PAK) 0.5 MG X 11 & 1 MG X 42 TBPK Take as directed Patient  not taking: Reported on 03/11/2023 11/01/22   Hoy Register, MD  venlafaxine XR (EFFEXOR XR) 75 MG 24 hr capsule Take 1 capsule (75 mg total) by mouth daily with breakfast. For hot flashes 11/01/22   Hoy Register, MD  amLODipine (NORVASC) 10 MG tablet Take 1 tablet daily 11/22/20 12/20/21  Grayce Sessions, NP  fluticasone (FLONASE) 50 MCG/ACT nasal spray Place 1-2 sprays into both nostrils daily. 11/28/20 12/20/21  Wieters, Hallie C, PA-C  hydrochlorothiazide (HYDRODIURIL) 25 MG tablet Take 1 tablet (25 mg total) by mouth daily. 11/22/20 12/20/21  Grayce Sessions, NP  QUEtiapine (SEROQUEL) 50 MG tablet Take 1 tablet (50 mg total) at bedtime for 6 days  then continue taking 2 tablets (100 mg total) at bedtime. 11/24/20 12/20/21  Meta Hatchet, PA      Allergies    Aspirin, Banana, Latex, Penicillins, Septra [bactrim], Shellfish allergy, Strawberry extract, and Tape    Review of Systems   Review of Systems  Constitutional:  Positive for fatigue. Negative for chills, diaphoresis and fever.  HENT:  Positive for congestion and rhinorrhea. Negative for sneezing.   Eyes: Negative.   Respiratory:  Positive for cough. Negative for chest tightness and shortness of breath.   Cardiovascular:  Positive for chest pain (Center of her chest, only with coughing). Negative for leg swelling.  Gastrointestinal:  Positive for abdominal pain. Negative for blood in stool, diarrhea, nausea and vomiting.  Genitourinary:  Positive for vaginal bleeding. Negative for difficulty urinating, flank pain, frequency, hematuria and vaginal discharge.  Musculoskeletal:  Negative for arthralgias and back pain.  Skin:  Negative for rash.  Neurological:  Negative for dizziness, speech difficulty, weakness, numbness and headaches.    Physical Exam Updated Vital Signs BP (!) 169/99 (BP Location: Left Arm)   Pulse 87   Temp 98.9 F (37.2 C) (Oral)   Resp 18   Ht 5\' 7"  (1.702 m)   Wt 86.2 kg   SpO2 98%   BMI 29.76 kg/m  Physical Exam Constitutional:      Appearance: She is well-developed.  HENT:     Head: Normocephalic and atraumatic.  Eyes:     Pupils: Pupils are equal, round, and reactive to light.  Cardiovascular:     Rate and Rhythm: Normal rate and regular rhythm.     Heart sounds: Normal heart sounds.  Pulmonary:     Effort: Pulmonary effort is normal. No respiratory distress.     Breath sounds: Normal breath sounds. No wheezing or rales.  Chest:     Chest wall: Tenderness (Reproducible tenderness over the sternal area) present.  Abdominal:     General: Bowel sounds are normal.     Palpations: Abdomen is soft.     Tenderness: There is abdominal  tenderness (Across the lower abdomen). There is no guarding or rebound.  Genitourinary:    Comments: Small amount of dark blood in the vaginal vault, no active heavy bleeding, generalized tenderness to the pelvic region, no cervical motion tenderness or specific adnexal tenderness Musculoskeletal:        General: Normal range of motion.     Cervical back: Normal range of motion and neck supple.  Lymphadenopathy:     Cervical: No cervical adenopathy.  Skin:    General: Skin is warm and dry.     Findings: No rash.  Neurological:     Mental Status: She is alert and oriented to person, place, and time.     ED Results / Procedures / Treatments  Labs (all labs ordered are listed, but only abnormal results are displayed) Labs Reviewed  BASIC METABOLIC PANEL - Abnormal; Notable for the following components:      Result Value   BUN 5 (*)    All other components within normal limits  CBC WITH DIFFERENTIAL/PLATELET - Abnormal; Notable for the following components:   RDW 17.9 (*)    All other components within normal limits  URINALYSIS, W/ REFLEX TO CULTURE (INFECTION SUSPECTED) - Abnormal; Notable for the following components:   Color, Urine ORANGE (*)    APPearance HAZY (*)    Hgb urine dipstick LARGE (*)    Protein, ur TRACE (*)    Bacteria, UA RARE (*)    All other components within normal limits  RESP PANEL BY RT-PCR (RSV, FLU A&B, COVID)  RVPGX2  HCG, SERUM, QUALITATIVE    EKG None  Radiology US PELVIC TRANSABD W/PELVIC DOPPLER Result Date: 03/26/2023 CLINICAL DATA:  Pelvic pain.  Vaginal bleeding EXAM: TRANSABDOMINAL AND TRANSVAGINAL ULTRASOUND OF PELVIS DOPPLER ULTRASOUND OF OVARIES TECHNIQUE: Both transabdominal and transvaginal ultrasound examinations of the pelvis were performed. Transabdominal technique was performed for global imaging of the pelvis including uterus, ovaries, adnexal regions, and pelvic cul-de-sac. It was necessary to proceed with endovaginal exam  following the transabdominal exam to visualize the endometrium and adnexa. Color and duplex Doppler ultrasound was utilized to evaluate blood flow to the ovaries. COMPARISON:  CT 10/29/2021. FINDINGS: Transabdominal images are limited by overlapping bowel gas and soft tissue. Uterus Measurements: 10.4 x 9.3 x 6.1 cm = volume: 307.5 mL. Lobular myometrium with mass lesions consistent with fibroids. Example left fundal measures 5.5 x 5.3 x 7.6 cm. Central left 4.6 x 3.5 x 4.4 cm and right 2.7 x 2.7 x 2.5 cm. These are appear larger than the prior CT scan of 2023. Endometrium Thickness: 13 mm.  Borderline thickened Right ovary Measurements: Obscured by bowel gas and soft tissue Left ovary Measurements: Obscured by bowel gas and soft tissue Pulsed Doppler evaluation was not performed as the ovaries themselves are not well seen Other findings No abnormal free fluid. IMPRESSION: Multiple uterine fibroids. The largest measures up to 7.6 cm. These are slightly larger than the prior CT scan. Please correlate with clinical history and if needed additional workup with MRI to further delineate enhancement and signal characteristics. Borderline thickened endometrium at 13 mm. Poor visualization of the ovaries. No separate adnexal mass. The ovaries could also be evaluated on additional imaging as clinically appropriate Electronically Signed   By: Karen Kays M.D.   On: 03/26/2023 14:59   DG Chest 2 View Result Date: 03/26/2023 CLINICAL DATA:  Cough.  Chest pain. EXAM: CHEST - 2 VIEW COMPARISON:  Chest radiograph dated October 06, 2018. FINDINGS: The heart size and mediastinal contours are within normal limits. No focal consolidation, pleural effusion, or pneumothorax. No acute osseous abnormality. IMPRESSION: No acute cardiopulmonary findings. Electronically Signed   By: Hart Robinsons M.D.   On: 03/26/2023 12:38    Procedures Procedures    Medications Ordered in ED Medications - No data to display  ED Course/  Medical Decision Making/ A&P                                 Medical Decision Making Amount and/or Complexity of Data Reviewed External Data Reviewed: notes. Labs: ordered. Decision-making details documented in ED Course. Radiology: ordered. Decision-making details documented in ED Course.  Risk  Prescription drug management. Decision regarding hospitalization.   Patient is a 53 year old who presents with vaginal bleeding.  She has some mild bleeding on exam.  She says she has been bleeding every day and passing clots.  Her hemoglobin is good.  Other labs are nonconcerning.  Pregnancy test is negative.  Ultrasound shows extensive fibroids.  They were unable to visualize ovaries but she does not have symptoms that would be more concerning for ovarian torsion.  I did consult with the midwife at the [facility], [place].  She recommended a higher tapering dose of Megace and she will try to get the patient into one of the women's clinic offices for follow-up.  I relayed this information to the patient.  She is comfortable with this plan.  She was discharged home in good condition.  Return precautions were given.  Final Clinical Impression(s) / ED Diagnoses Final diagnoses:  Dysmenorrhea    Rx / DC Orders ED Discharge Orders     None         Rolan Bucco, MD 03/26/23 1538    Rolan Bucco, MD 03/26/23 1539

## 2023-03-28 ENCOUNTER — Encounter: Payer: Self-pay | Admitting: Internal Medicine

## 2023-03-28 ENCOUNTER — Other Ambulatory Visit: Payer: Self-pay

## 2023-03-28 ENCOUNTER — Telehealth: Payer: Self-pay

## 2023-03-28 ENCOUNTER — Ambulatory Visit: Payer: MEDICAID | Attending: Internal Medicine | Admitting: Internal Medicine

## 2023-03-28 ENCOUNTER — Encounter: Payer: Self-pay | Admitting: Family Medicine

## 2023-03-28 VITALS — BP 175/93 | HR 77 | Temp 98.8°F | Wt 200.0 lb

## 2023-03-28 DIAGNOSIS — I10 Essential (primary) hypertension: Secondary | ICD-10-CM | POA: Diagnosis not present

## 2023-03-28 DIAGNOSIS — N938 Other specified abnormal uterine and vaginal bleeding: Secondary | ICD-10-CM

## 2023-03-28 DIAGNOSIS — N921 Excessive and frequent menstruation with irregular cycle: Secondary | ICD-10-CM

## 2023-03-28 DIAGNOSIS — D259 Leiomyoma of uterus, unspecified: Secondary | ICD-10-CM | POA: Diagnosis not present

## 2023-03-28 DIAGNOSIS — F17210 Nicotine dependence, cigarettes, uncomplicated: Secondary | ICD-10-CM

## 2023-03-28 MED ORDER — MEGESTROL ACETATE 20 MG PO TABS
ORAL_TABLET | ORAL | 0 refills | Status: DC
  Filled 2023-03-28: qty 72, 30d supply, fill #0

## 2023-03-28 MED ORDER — VALSARTAN-HYDROCHLOROTHIAZIDE 80-12.5 MG PO TABS
0.5000 | ORAL_TABLET | Freq: Every day | ORAL | 0 refills | Status: DC
  Filled 2023-03-28: qty 45, 90d supply, fill #0

## 2023-03-28 MED ORDER — TRAMADOL HCL 50 MG PO TABS
50.0000 mg | ORAL_TABLET | Freq: Two times a day (BID) | ORAL | 0 refills | Status: DC | PRN
  Filled 2023-03-28: qty 10, 5d supply, fill #0

## 2023-03-28 NOTE — Telephone Encounter (Signed)
Copied from CRM [id number]. Topic: General - Other >> Mar 28, 2023  2:33 PM Phill Myron wrote: Reason for CRM: pt [facility] is calling because there are a couple discrepancies on her return to work note. 1. Correct date should be: April 01, 2023                                              2. Correct office number: [phone]  Please correct as soon as possible, her job is questioning her about it and think she may have wrote it.     Letter has been fixed and patient has been informed.
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
## 2023-04-03 ENCOUNTER — Encounter: Payer: Self-pay | Admitting: Family Medicine

## 2023-04-03 ENCOUNTER — Encounter: Payer: Self-pay | Admitting: Physician Assistant

## 2023-04-04 ENCOUNTER — Other Ambulatory Visit: Payer: Self-pay | Admitting: Family Medicine

## 2023-04-04 ENCOUNTER — Ambulatory Visit: Payer: MEDICAID | Admitting: Orthopedic Surgery

## 2023-04-04 ENCOUNTER — Telehealth: Payer: Self-pay

## 2023-04-04 ENCOUNTER — Telehealth: Payer: MEDICAID

## 2023-04-04 DIAGNOSIS — Z1231 Encounter for screening mammogram for malignant neoplasm of breast: Secondary | ICD-10-CM

## 2023-04-04 NOTE — Telephone Encounter (Signed)
Patient scheduled VV for 04/07/2022 .

## 2023-04-05 ENCOUNTER — Other Ambulatory Visit: Payer: Self-pay

## 2023-04-06 ENCOUNTER — Other Ambulatory Visit: Payer: Self-pay | Admitting: Internal Medicine

## 2023-04-06 DIAGNOSIS — N921 Excessive and frequent menstruation with irregular cycle: Secondary | ICD-10-CM

## 2023-04-08 ENCOUNTER — Ambulatory Visit: Payer: MEDICAID | Admitting: Physician Assistant

## 2023-04-09 ENCOUNTER — Telehealth (HOSPITAL_BASED_OUTPATIENT_CLINIC_OR_DEPARTMENT_OTHER): Payer: MEDICAID | Admitting: Family Medicine

## 2023-04-09 ENCOUNTER — Encounter: Payer: Self-pay | Admitting: Family Medicine

## 2023-04-09 ENCOUNTER — Other Ambulatory Visit: Payer: Self-pay

## 2023-04-09 DIAGNOSIS — Z1231 Encounter for screening mammogram for malignant neoplasm of breast: Secondary | ICD-10-CM

## 2023-04-09 DIAGNOSIS — R42 Dizziness and giddiness: Secondary | ICD-10-CM

## 2023-04-09 DIAGNOSIS — N921 Excessive and frequent menstruation with irregular cycle: Secondary | ICD-10-CM | POA: Diagnosis not present

## 2023-04-09 DIAGNOSIS — Z13228 Encounter for screening for other metabolic disorders: Secondary | ICD-10-CM

## 2023-04-09 DIAGNOSIS — Z131 Encounter for screening for diabetes mellitus: Secondary | ICD-10-CM

## 2023-04-09 MED ORDER — IRON (FERROUS SULFATE) 325 (65 FE) MG PO TABS
325.0000 mg | ORAL_TABLET | Freq: Every day | ORAL | 1 refills | Status: DC
  Filled 2023-04-09: qty 60, 60d supply, fill #0
  Filled 2023-05-16 – 2023-06-04 (×2): qty 60, 60d supply, fill #1

## 2023-04-09 MED ORDER — TRANEXAMIC ACID 650 MG PO TABS
1300.0000 mg | ORAL_TABLET | Freq: Three times a day (TID) | ORAL | 0 refills | Status: DC
  Filled 2023-04-09: qty 180, 30d supply, fill #0

## 2023-04-09 NOTE — Patient Instructions (Signed)
 VISIT SUMMARY:  During today's visit, we discussed your ongoing vaginal bleeding and associated symptoms, including cramps, lightheadedness, and dizziness. We reviewed your current treatment with Megace  and planned additional steps to manage your condition. We also addressed your work-related concerns and completed the necessary paperwork for medical leave.  YOUR PLAN:  -ABNORMAL UTERINE BLEEDING: Abnormal uterine bleeding is when you experience heavy or unusual bleeding from the uterus. Despite your current treatment with Megace , your symptoms have persisted. We will start you on tranexamic acid  to help control the bleeding and manage pain. Additionally, we will prescribe iron  tablets to prevent anemia due to blood loss. Please follow up with your GYN specialist in 6 days for further management.  -POTENTIAL ANEMIA: Anemia occurs when you do not have enough healthy red blood cells to carry adequate oxygen to your body's tissues, often due to blood loss. Given your symptoms of lightheadedness and dizziness, we will check your hemoglobin levels and consider an iron  infusion if they are significantly low. We will also start you on iron  tablets to prevent anemia. Please follow up with your GYN specialist in 6 days for further management.  INSTRUCTIONS:  Please follow up with your GYN specialist in 6 days for further management. We have also completed the work leave form for you until your GYN appointment on 04/15/2023.

## 2023-04-09 NOTE — Progress Notes (Signed)
 Virtual Visit via Video Note  I connected with [name], on 04/09/2023 at 8:31 AM by video enabled telemedicine device and verified that I am speaking with the correct person using two identifiers.   Consent: I discussed the limitations, risks, security and privacy concerns of performing an evaluation and management service by telemedicine and the availability of in person appointments. I also discussed with the patient that there may be a patient responsible charge related to this service. The patient expressed understanding and agreed to proceed.   Location of Patient: Home  Location of Provider: Clinic   Persons participating in Telemedicine visit: [name] Dr. Macaela Presas     History of Present Illness: [name] is a 53 y.o. year old female  with a history of vertigo. Discussed the use of AI scribe software for clinical note transcription with the patient, who gave verbal consent to proceed.  The patient, with a history of vaginal bleeding, presents with worsening symptoms despite treatment with Megace . She reports that the bleeding has been ongoing since mid-December and has recently become heavier. Accompanying the bleeding are cramps in the stomach and back. She also experiences lightheadedness and dizziness upon standing, which she attributes to the heavy bleeding. The patient also reports passing clots with each bathroom visit. She has been following the prescribed Megace  regimen, initially taking three tablets three times a day, then two tablets twice a day, and currently two tablets three times a day. She is scheduled to decrease to two tablets daily. The patient has an upcoming appointment with a GYN specialist in six days. She has also been experiencing work-related issues due to her condition, with her employer suggesting medical leave to protect her job.  She has been out of work since 04/04/2023 due to her condition as she is having to either leave early  or call out because she had an incident when she stained her clothes.       Past Medical History:  Diagnosis Date   Alcoholism (HCC)    Allergy     seasonal   Anxiety    Arthritis    Asthma    Blood transfusion    1989 at [place]   Bronchitis    Chronic bipolar disorder (HCC)    Chronic kidney disease    Depression    bipolar   GERD (gastroesophageal reflux disease)    Headache(784.0)    occasional   Hypertension    Mental disorder    bipolar   MRSA infection 2011   left lower leg   Pancreatitis    Pyelonephritis 10/2010   Recurrent upper respiratory infection (URI)    states she has not been to MD and feels like she has  bronchitis now- greenish sputum   Shortness of breath    sometimes with exertion   Sickle cell anemia (HCC)    sickle cell trait    Sickle cell trait (HCC)    Sleep apnea    CPAP   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   UTI (lower urinary tract infection) 10/2010   Allergies  Allergen Reactions   Aspirin  Other (See Comments)    chilhood allergy    Banana Hives   Latex Hives   Penicillins Swelling    Has patient had a PCN reaction causing immediate rash, facial/tongue/throat swelling, SOB or lightheadedness with hypotension: unknown Has patient had a PCN reaction causing severe rash involving mucus membranes or skin necrosis: unknown Has patient  had a PCN reaction that required hospitalization unknown Has patient had a PCN reaction occurring within the last 10 years: no If all of the above answers are NO, then may proceed with Cephalosporin use.    Septra [Bactrim] Itching and Swelling   Shellfish Allergy  Other (See Comments)    No reaction tested positive   Strawberry Extract Hives   Tape Other (See Comments)    Skin peels away if paper tape is left on for long period of time    Current Outpatient Medications on File Prior to Visit  Medication Sig Dispense Refill   cetirizine  (ZYRTEC ) 10 MG tablet Take 1 tablet (10 mg  total) by mouth daily. 30 tablet 11   loratadine  (CLARITIN ) 10 MG tablet Take 1 tablet (10 mg total) by mouth daily. as needed for itching 90 tablet 1   megestrol  (MEGACE ) 20 MG tablet Take 2 tablets (40 mg total) by mouth every 8 (eight) hours for 3 days, THEN 2 tablets (40 mg total) daily. 138 tablet 0   methocarbamol  (ROBAXIN ) 500 MG tablet Take 2 tablets (1,000 mg total) by mouth every 6 (six) hours as needed for muscle spasms. 120 tablet 0   traMADol  (ULTRAM ) 50 MG tablet Take 1 tablet (50 mg total) by mouth every 12 (twelve) hours as needed. 10 tablet 0   triamcinolone  cream (KENALOG ) 0.1 % Apply 1 Application topically 2 (two) times daily. (Patient not taking: Reported on 03/28/2023) 30 g 0   valACYclovir  (VALTREX ) 500 MG tablet Take 1 tablet (500 mg total) by mouth daily. For herpes suppression 90 tablet 1   valsartan -hydrochlorothiazide  (DIOVAN -HCT) 80-12.5 MG tablet Take 0.5 tablets by mouth daily. 45 tablet 0   venlafaxine  XR (EFFEXOR  XR) 75 MG 24 hr capsule Take 1 capsule (75 mg total) by mouth daily with breakfast. For hot flashes (Patient not taking: Reported on 03/28/2023) 90 capsule 0   [DISCONTINUED] amLODipine  (NORVASC ) 10 MG tablet Take 1 tablet daily 90 tablet 1   [DISCONTINUED] fluticasone  (FLONASE ) 50 MCG/ACT nasal spray Place 1-2 sprays into both nostrils daily. 16 g 0   [DISCONTINUED] hydrochlorothiazide  (HYDRODIURIL ) 25 MG tablet Take 1 tablet (25 mg total) by mouth daily. 90 tablet 0   [DISCONTINUED] QUEtiapine  (SEROQUEL ) 50 MG tablet Take 1 tablet (50 mg total) at bedtime for 6 days then continue taking 2 tablets (100 mg total) at bedtime. 30 tablet 1   No current facility-administered medications on file prior to visit.    ROS: See HPI  Observations/Objective: Awake, alert, oriented x3 Not in acute distress Normal mood      Latest Ref Rng & Units 03/26/2023   12:58 PM 04/04/2022    3:20 PM 10/29/2021   12:27 PM  CMP  Glucose 70 - 99 mg/dL 97  893  93   BUN 6 -  20 mg/dL 5  9  7    Creatinine 0.44 - 1.00 mg/dL 9.38  9.26  9.38   Sodium 135 - 145 mmol/L 135  138  144   Potassium 3.5 - 5.1 mmol/L 3.8  3.8  3.7   Chloride 98 - 111 mmol/L 104  98  109   CO2 22 - 32 mmol/L 22  22  20    Calcium  8.9 - 10.3 mg/dL 9.0  89.9  8.6   Total Protein 6.0 - 8.5 g/dL  7.5  8.1   Total Bilirubin 0.0 - 1.2 mg/dL  0.3  0.6   Alkaline Phos 44 - 121 IU/L  113  93  AST 0 - 40 IU/L  46  33   ALT 0 - 32 IU/L  19  15     Lipid Panel     Component Value Date/Time   CHOL 170 06/10/2019 1119   TRIG 121 06/10/2019 1119   HDL 62 06/10/2019 1119   CHOLHDL 2.7 06/10/2019 1119   LDLCALC 87 06/10/2019 1119   LABVLDL 21 06/10/2019 1119    Lab Results  Component Value Date   HGBA1C 5.2 01/17/2018    CBC    Component Value Date/Time   WBC 5.5 03/26/2023 1258   RBC 4.62 03/26/2023 1258   HGB 12.5 03/26/2023 1258   HGB 12.1 03/11/2023 1615   HCT 38.3 03/26/2023 1258   HCT 38.8 03/11/2023 1615   PLT 321 03/26/2023 1258   PLT 458 (H) 03/11/2023 1615   MCV 82.9 03/26/2023 1258   MCV 86 03/11/2023 1615   MCH 27.1 03/26/2023 1258   MCHC 32.6 03/26/2023 1258   RDW 17.9 (H) 03/26/2023 1258   RDW 18.0 (H) 03/11/2023 1615   LYMPHSABS 3.0 03/26/2023 1258   LYMPHSABS 4.6 (H) 03/11/2023 1615   MONOABS 0.3 03/26/2023 1258   EOSABS 0.1 03/26/2023 1258   EOSABS 0.1 03/11/2023 1615   BASOSABS 0.0 03/26/2023 1258   BASOSABS 0.1 03/11/2023 1615    Assessment and Plan:     Abnormal Uterine Bleeding Persistent heavy bleeding with clots since mid-December, despite Megace  therapy. Associated symptoms include cramping, lightheadedness, and dizziness. Last hemoglobin was normal on 03/26/2023, but concern for possible drop due to ongoing blood loss. -Order labs including CBC, diabetes screening, and cholesterol panel. -Start tranexamic acid  for pain and to help control bleeding. -Prescribe iron  tablets to prevent anemia due to blood loss. -Advise patient to follow up with GYN  in 6 days for further management. -Complete work leave form for patient until GYN appointment on 04/15/2023.  Advised that would not be providing any extension after 04/15/2023 as admin work extension would need to come from GYN.  Potential Anemia Lightheadedness upon standing, possibly due to ongoing heavy menstrual bleeding. Last hemoglobin was normal on 03/26/2023. -Check hemoglobin and consider iron  infusion if significantly low. -Start iron  tablets to prevent anemia due to blood loss. -Advise patient to follow up with GYN in 6 days for further management.        Follow Up Instructions: Return in about 1 week (around 04/16/2023) for OB/GYN to follow-up on menorrhagia.    I discussed the assessment and treatment plan with the patient. The patient was provided an opportunity to ask questions and all were answered. The patient agreed with the plan and demonstrated an understanding of the instructions.   The patient was advised to call back or seek an in-person evaluation if the symptoms worsen or if the condition fails to improve as anticipated.     I provided 12 minutes total of Telehealth time during this encounter including median intraservice time, reviewing previous notes, investigations, ordering medications, medical decision making, coordinating care and patient verbalized understanding at the end of the visit.     Corrina Sabin, MD, FAAFP. [facility] [place], [place] [phone]   04/09/2023, 8:31 AM

## 2023-04-10 ENCOUNTER — Telehealth: Payer: Self-pay | Admitting: Family Medicine

## 2023-04-10 ENCOUNTER — Ambulatory Visit: Payer: MEDICAID | Attending: Family Medicine

## 2023-04-10 ENCOUNTER — Other Ambulatory Visit: Payer: Self-pay

## 2023-04-10 DIAGNOSIS — Z13228 Encounter for screening for other metabolic disorders: Secondary | ICD-10-CM

## 2023-04-10 DIAGNOSIS — R42 Dizziness and giddiness: Secondary | ICD-10-CM

## 2023-04-10 DIAGNOSIS — Z131 Encounter for screening for diabetes mellitus: Secondary | ICD-10-CM

## 2023-04-10 DIAGNOSIS — N921 Excessive and frequent menstruation with irregular cycle: Secondary | ICD-10-CM

## 2023-04-10 MED ORDER — PANTOPRAZOLE SODIUM 40 MG PO TBEC
40.0000 mg | DELAYED_RELEASE_TABLET | Freq: Every day | ORAL | 1 refills | Status: DC
  Filled 2023-04-10: qty 30, 30d supply, fill #0
  Filled 2023-05-16: qty 30, 30d supply, fill #1
  Filled 2023-05-23: qty 30, 30d supply, fill #0

## 2023-04-10 NOTE — Telephone Encounter (Signed)
 Pt coming in for lab appt wanted to know if she could get something for heartburn

## 2023-04-10 NOTE — Telephone Encounter (Signed)
 Routing to PCP for review.

## 2023-04-10 NOTE — Telephone Encounter (Signed)
Prescription for Protonix sent to pharmacy  

## 2023-04-10 NOTE — Addendum Note (Signed)
 Addended by: Keelie Zemanek on: 04/10/2023 01:02 PM   Modules accepted: Orders

## 2023-04-11 ENCOUNTER — Other Ambulatory Visit: Payer: Self-pay

## 2023-04-11 ENCOUNTER — Other Ambulatory Visit: Payer: Self-pay | Admitting: Family Medicine

## 2023-04-11 ENCOUNTER — Ambulatory Visit: Payer: MEDICAID | Admitting: Physician Assistant

## 2023-04-11 ENCOUNTER — Encounter: Payer: Self-pay | Admitting: Family Medicine

## 2023-04-11 LAB — CBC WITH DIFFERENTIAL/PLATELET
Basophils Absolute: 0 10*3/uL (ref 0.0–0.2)
Basos: 1 %
EOS (ABSOLUTE): 0.1 10*3/uL (ref 0.0–0.4)
Eos: 1 %
Hematocrit: 37.1 % (ref 34.0–46.6)
Hemoglobin: 11.7 g/dL (ref 11.1–15.9)
Immature Grans (Abs): 0 10*3/uL (ref 0.0–0.1)
Immature Granulocytes: 0 %
Lymphocytes Absolute: 2.5 10*3/uL (ref 0.7–3.1)
Lymphs: 29 %
MCH: 27.5 pg (ref 26.6–33.0)
MCHC: 31.5 g/dL (ref 31.5–35.7)
MCV: 87 fL (ref 79–97)
Monocytes Absolute: 0.7 10*3/uL (ref 0.1–0.9)
Monocytes: 8 %
Neutrophils Absolute: 5.2 10*3/uL (ref 1.4–7.0)
Neutrophils: 61 %
Platelets: 429 10*3/uL (ref 150–450)
RBC: 4.26 x10E6/uL (ref 3.77–5.28)
RDW: 17 % — ABNORMAL HIGH (ref 11.7–15.4)
WBC: 8.6 10*3/uL (ref 3.4–10.8)

## 2023-04-11 LAB — LP+NON-HDL CHOLESTEROL
Cholesterol, Total: 207 mg/dL — ABNORMAL HIGH (ref 100–199)
HDL: 62 mg/dL (ref >39)
LDL Chol Calc (NIH): 131 mg/dL — ABNORMAL HIGH (ref 0–99)
Total Non-HDL-Chol (LDL+VLDL): 145 mg/dL — ABNORMAL HIGH (ref 0–129)
Triglycerides: 78 mg/dL (ref 0–149)
VLDL Cholesterol Cal: 14 mg/dL (ref 5–40)

## 2023-04-11 LAB — HEMOGLOBIN A1C
Est. average glucose Bld gHb Est-mCnc: 105 mg/dL
Hgb A1c MFr Bld: 5.3 % (ref 4.8–5.6)

## 2023-04-11 MED ORDER — ATORVASTATIN CALCIUM 20 MG PO TABS
20.0000 mg | ORAL_TABLET | Freq: Every day | ORAL | 1 refills | Status: DC
  Filled 2023-04-11: qty 90, 90d supply, fill #0
  Filled 2023-05-16 – 2023-06-17 (×5): qty 90, 90d supply, fill #1
  Filled 2023-06-20: qty 90, 90d supply, fill #0

## 2023-04-11 NOTE — Telephone Encounter (Signed)
Patient has been informed of medication being sent

## 2023-04-12 ENCOUNTER — Telehealth: Payer: Self-pay

## 2023-04-12 NOTE — Telephone Encounter (Signed)
 Educational information sent to patient via her email as requested

## 2023-04-12 NOTE — Telephone Encounter (Unsigned)
 Copied from CRM [id number]. Topic: General - Other >> Apr 12, 2023  1:56 PM Nathanel DEL wrote: Reason for CRM: pt states she recently found out she has high cholesterol and also has high bp. She wants to know if you can send her info on what she can eat and possibly should not eat She has try google, but no luck.

## 2023-04-12 NOTE — Telephone Encounter (Signed)
 Copied from CRM [id number]. Topic: General - Other >> Apr 12, 2023  1:56 PM [name] wrote: Reason for CRM: pt states she recently found out she has high cholesterol and also has high bp. She wants to know if you can send her info on what she can eat and possibly should not eat She has try google, but no luck.

## 2023-04-15 ENCOUNTER — Other Ambulatory Visit: Payer: Self-pay

## 2023-04-15 ENCOUNTER — Other Ambulatory Visit (HOSPITAL_COMMUNITY)
Admission: RE | Admit: 2023-04-15 | Discharge: 2023-04-15 | Disposition: A | Discharge status: Home / Self Care | Payer: MEDICAID | Source: Ambulatory Visit | Attending: Obstetrics and Gynecology | Admitting: Obstetrics and Gynecology

## 2023-04-15 ENCOUNTER — Ambulatory Visit (INDEPENDENT_AMBULATORY_CARE_PROVIDER_SITE_OTHER): Payer: MEDICAID | Admitting: Obstetrics and Gynecology

## 2023-04-15 ENCOUNTER — Encounter: Payer: Self-pay | Admitting: Obstetrics and Gynecology

## 2023-04-15 VITALS — BP 156/78 | HR 72 | Temp 98.1°F | Ht 69.69 in | Wt 203.0 lb

## 2023-04-15 DIAGNOSIS — Z124 Encounter for screening for malignant neoplasm of cervix: Secondary | ICD-10-CM

## 2023-04-15 DIAGNOSIS — D219 Benign neoplasm of connective and other soft tissue, unspecified: Secondary | ICD-10-CM

## 2023-04-15 DIAGNOSIS — N939 Abnormal uterine and vaginal bleeding, unspecified: Secondary | ICD-10-CM | POA: Insufficient documentation

## 2023-04-15 NOTE — Assessment & Plan Note (Signed)
 Hormonal work-up completed by PCP. Reviewed FSH/LH, TSH, CBC. US  also reviewed Requires EMB First line therapies reviewed, including hormonal therapy and tranexamic acid . Also discussed endometrial ablation, uterine artery embolization and hysterectomy. Patient is interested in endometrial ablation. Pamphlets provided. RTO for EMB. Will discuss further management at that time.

## 2023-04-15 NOTE — Progress Notes (Signed)
 53 y.o. [id number] female with prior CD here for consultation for AUB with known fibroids. Significant Other.  Referred from PCP for uterine fibroids, heavy periods, passing clots. Pt reports longstanding history of heavy periods.  However her cycles have become more irregular.  She only had 2 cycles in 2024-April and December. Last cycle last almost 2 months. Severe cramping, back pain, lightheaded. States cycle stopped three days ago.  She stopped the Megace  2 weeks ago and never started Lysteda .  She did not feel that the Megace  was helping.  Patient's last menstrual period was 02/17/2023 (approximate). Period Duration (Days): 5-7 Period Pattern: (!) Irregular Menstrual Flow: Heavy Menstrual Control: Maxi pad Dysmenorrhea: (!) Severe Dysmenorrhea Symptoms: Headache, Cramping 04/10/23 Hgb 11.7, Ply 429k 03/11/23 FSH 7, TSH wnl PAP:     Component Value Date/Time   DIAGPAP  06/10/2019 1042    - Negative for intraepithelial lesion or malignancy (NILM)   DIAGPAP  12/31/2016 0000    NEGATIVE FOR INTRAEPITHELIAL LESIONS OR MALIGNANCY.   HPVHIGH Negative 06/10/2019 1042   ADEQPAP  06/10/2019 1042    Satisfactory for evaluation; transformation zone component PRESENT.   ADEQPAP  12/31/2016 0000    Satisfactory for evaluation  endocervical/transformation zone component PRESENT.   Birth control: BTL Sexually active: yes    GYN HISTORY: No significant history  OB History  Gravida Para Term Preterm AB Living  8 4   4 4   SAB IAB Ectopic Multiple Live Births  2 2       # Outcome Date GA Lbr Len/2nd Weight Sex Type Anes PTL Lv  8 Para           7 Para           6 Para           5 Para           4 SAB           3 SAB           2 IAB           1 IAB             Past Medical History:  Diagnosis Date   Alcoholism (HCC)    Allergy    seasonal   Anxiety    Arthritis    Asthma    Blood transfusion    1989 at [place]   Bronchitis    Chronic bipolar disorder (HCC)     Chronic kidney disease    Depression    bipolar   GERD (gastroesophageal reflux disease)    Headache(784.0)    occasional   Hypertension    Mental disorder    bipolar   MRSA infection 2011   left lower leg   Pancreatitis    Pyelonephritis 10/2010   Recurrent upper respiratory infection (URI)    states she has not been to MD and feels like she has  bronchitis now- greenish sputum   Shortness of breath    sometimes with exertion   Sickle cell anemia (HCC)    sickle cell trait    Sickle cell trait (HCC)    Sleep apnea    CPAP   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   UTI (lower urinary tract infection) 10/2010    Past Surgical History:  Procedure Laterality Date   CESAREAN SECTION     KNEE ARTHROSCOPY  06/15/2011   Procedure: ARTHROSCOPY KNEE;  Surgeon: Homero Luster  Guyann Leitz, MD;  Location: [facility] OR;  Service: Orthopedics;  Laterality: Left;  LEFT KNEE SCOPE WITH LYSIS OF ADHESIONS AND MANIPULATION   KNEE ARTHROSCOPY WITH MEDIAL MENISECTOMY Left 03/27/2018   Procedure: LEFT KNEE ARTHROSCOPY WITH PARTIAL MEDIAL MENISCECTOMY;  Surgeon: Arnie Lao, MD;  Location: [facility];  Service: Orthopedics;  Laterality: Left;   ORIF TIBIA PLATEAU  03/08/2011   Procedure: OPEN REDUCTION INTERNAL FIXATION (ORIF) TIBIAL PLATEAU;  Surgeon: Arlette Lagos;  Location: [facility] OR;  Service: Orthopedics;  Laterality: Left;   TUBAL LIGATION      Current Outpatient Medications on File Prior to Visit  Medication Sig Dispense Refill   atorvastatin  (LIPITOR) 20 MG tablet Take 1 tablet (20 mg total) by mouth daily. 90 tablet 1   cetirizine  (ZYRTEC ) 10 MG tablet Take 1 tablet (10 mg total) by mouth daily. 30 tablet 11   Iron , Ferrous Sulfate , 325 (65 Fe) MG TABS Take 1 tablet (325 mg) by mouth daily. 60 tablet 1   loratadine  (CLARITIN ) 10 MG tablet Take 1 tablet (10 mg total) by mouth daily. as needed for itching 90 tablet 1   megestrol  (MEGACE ) 20 MG tablet Take 2 tablets (40 mg total)  by mouth every 8 (eight) hours for 3 days, THEN 2 tablets (40 mg total) daily. 138 tablet 0   pantoprazole  (PROTONIX ) 40 MG tablet Take 1 tablet (40 mg total) by mouth daily. 30 tablet 1   triamcinolone  cream (KENALOG ) 0.1 % Apply 1 Application topically 2 (two) times daily. 30 g 0   valACYclovir  (VALTREX ) 500 MG tablet Take 1 tablet (500 mg total) by mouth daily. For herpes suppression 90 tablet 1   valsartan -hydrochlorothiazide  (DIOVAN -HCT) 80-12.5 MG tablet Take 0.5 tablets by mouth daily. 45 tablet 0   [DISCONTINUED] amLODipine  (NORVASC ) 10 MG tablet Take 1 tablet daily 90 tablet 1   [DISCONTINUED] fluticasone  (FLONASE ) 50 MCG/ACT nasal spray Place 1-2 sprays into both nostrils daily. 16 g 0   [DISCONTINUED] hydrochlorothiazide  (HYDRODIURIL ) 25 MG tablet Take 1 tablet (25 mg total) by mouth daily. 90 tablet 0   [DISCONTINUED] QUEtiapine  (SEROQUEL ) 50 MG tablet Take 1 tablet (50 mg total) at bedtime for 6 days then continue taking 2 tablets (100 mg total) at bedtime. 30 tablet 1   No current facility-administered medications on file prior to visit.    Allergies  Allergen Reactions   Aspirin  Other (See Comments)    "chilhood allergy"   Banana Hives   Latex Hives   Penicillins Swelling    Has patient had a PCN reaction causing immediate rash, facial/tongue/throat swelling, SOB or lightheadedness with hypotension: unknown Has patient had a PCN reaction causing severe rash involving mucus membranes or skin necrosis: unknown Has patient had a PCN reaction that required hospitalization unknown Has patient had a PCN reaction occurring within the last 10 years: no If all of the above answers are "NO", then may proceed with Cephalosporin use.    Septra [Bactrim] Itching and Swelling   Shellfish Allergy Other (See Comments)    No "reaction" tested positive   Strawberry Extract Hives   Tape Other (See Comments)    Skin peels away if paper tape is left on for long period of time       PE Today's Vitals   04/15/23 1419  BP: (!) 156/78  Pulse: 72  Temp: 98.1 F (36.7 C)  SpO2: 99%  Weight: 203 lb (92.1 kg)  Height: 5' 9.69" (1.77 m)   Body mass index is  29.39 kg/m.  Physical Exam Vitals reviewed. Exam conducted with a chaperone present.  Constitutional:      General: She is not in acute distress.    Appearance: Normal appearance.  HENT:     Head: Normocephalic and atraumatic.     Nose: Nose normal.  Eyes:     Extraocular Movements: Extraocular movements intact.     Conjunctiva/sclera: Conjunctivae normal.  Pulmonary:     Effort: Pulmonary effort is normal.  Genitourinary:    General: Normal vulva.     Exam position: Lithotomy position.     Vagina: Normal. No vaginal discharge.     Cervix: Normal. No cervical motion tenderness, discharge or lesion.     Uterus: Normal. Not enlarged and not tender.      Adnexa: Right adnexa normal and left adnexa normal.  Musculoskeletal:        General: Normal range of motion.     Cervical back: Normal range of motion.  Neurological:     General: No focal deficit present.     Mental Status: She is alert.  Psychiatric:        Mood and Affect: Mood normal.        Behavior: Behavior normal.      03/26/23 TVUS: Narrative & Impression  CLINICAL DATA:  Pelvic pain.  Vaginal bleeding   EXAM: TRANSABDOMINAL AND TRANSVAGINAL ULTRASOUND OF PELVIS   DOPPLER ULTRASOUND OF OVARIES   TECHNIQUE: Both transabdominal and transvaginal ultrasound examinations of the pelvis were performed. Transabdominal technique was performed for global imaging of the pelvis including uterus, ovaries, adnexal regions, and pelvic cul-de-sac.   It was necessary to proceed with endovaginal exam following the transabdominal exam to visualize the endometrium and adnexa. Color and duplex Doppler ultrasound was utilized to evaluate blood flow to the ovaries.   COMPARISON:  CT 10/29/2021.   FINDINGS: Transabdominal images are limited by  overlapping bowel gas and soft tissue.   Uterus   Measurements: 10.4 x 9.3 x 6.1 cm = volume: 307.5 mL. Lobular myometrium with mass lesions consistent with fibroids. Example left fundal measures 5.5 x 5.3 x 7.6 cm. Central left 4.6 x 3.5 x 4.4 cm and right 2.7 x 2.7 x 2.5 cm. These are appear larger than the prior CT scan of 2023.   Endometrium   Thickness: 13 mm.  Borderline thickened   Right ovary   Measurements: Obscured by bowel gas and soft tissue   Left ovary   Measurements: Obscured by bowel gas and soft tissue   Pulsed Doppler evaluation was not performed as the ovaries themselves are not well seen   Other findings   No abnormal free fluid.   IMPRESSION: Multiple uterine fibroids. The largest measures up to 7.6 cm. These are slightly larger than the prior CT scan. Please correlate with clinical history and if needed additional workup with MRI to further delineate enhancement and signal characteristics.   Borderline thickened endometrium at 13 mm.   Poor visualization of the ovaries. No separate adnexal mass. The ovaries could also be evaluated on additional imaging as clinically appropriate     Assessment and Plan:        Cervical cancer screening -     Cytology - PAP  Abnormal uterine bleeding (AUB) Assessment & Plan: Hormonal work-up completed by PCP. Reviewed FSH/LH, TSH, CBC. US  also reviewed Requires EMB First line therapies reviewed, including hormonal therapy and tranexamic acid . Also discussed endometrial ablation, uterine artery embolization and hysterectomy. Patient is interested in endometrial ablation.  Pamphlets provided. RTO for EMB. Will discuss further management at that time.   Orders: -     Endometrial biopsy; Future    Romaine Closs, MD

## 2023-04-16 ENCOUNTER — Other Ambulatory Visit: Payer: Self-pay

## 2023-04-16 ENCOUNTER — Telehealth: Payer: Self-pay | Admitting: *Deleted

## 2023-04-16 NOTE — Telephone Encounter (Signed)
Spoke with patient. Patient request to proceed with hysterectomy. Will plan to further discuss at visit on 04/23/23.   Patient asking when surgery will be scheduled. Reviewed surgery scheduling process, sterilization consent form and FMLA. Questions answered. Patient appreciative of call  Encounter closed.

## 2023-04-16 NOTE — Telephone Encounter (Signed)
Patient left message on surgery line requesting to proceed with scheduling hysterectomy vs ablation.   Per review of EPIC, patient is scheduled for EMB on 04/23/23.

## 2023-04-18 ENCOUNTER — Encounter: Payer: Self-pay | Admitting: Obstetrics and Gynecology

## 2023-04-18 LAB — CYTOLOGY - PAP
Comment: NEGATIVE
Diagnosis: NEGATIVE
Diagnosis: REACTIVE
High risk HPV: NEGATIVE

## 2023-04-19 ENCOUNTER — Ambulatory Visit: Payer: MEDICAID | Admitting: Obstetrics and Gynecology

## 2023-04-23 ENCOUNTER — Other Ambulatory Visit (HOSPITAL_COMMUNITY)
Admission: RE | Admit: 2023-04-23 | Discharge: 2023-04-23 | Disposition: A | Discharge status: Home / Self Care | Payer: MEDICAID | Source: Ambulatory Visit | Attending: Obstetrics and Gynecology | Admitting: Obstetrics and Gynecology

## 2023-04-23 ENCOUNTER — Other Ambulatory Visit: Payer: Self-pay

## 2023-04-23 ENCOUNTER — Encounter: Payer: Self-pay | Admitting: Obstetrics and Gynecology

## 2023-04-23 ENCOUNTER — Ambulatory Visit: Payer: MEDICAID | Admitting: Obstetrics and Gynecology

## 2023-04-23 VITALS — BP 142/80 | HR 82 | Temp 98.8°F | Wt 203.0 lb

## 2023-04-23 DIAGNOSIS — F17219 Nicotine dependence, cigarettes, with unspecified nicotine-induced disorders: Secondary | ICD-10-CM | POA: Diagnosis not present

## 2023-04-23 DIAGNOSIS — N760 Acute vaginitis: Secondary | ICD-10-CM

## 2023-04-23 DIAGNOSIS — N939 Abnormal uterine and vaginal bleeding, unspecified: Secondary | ICD-10-CM

## 2023-04-23 DIAGNOSIS — F1094 Alcohol use, unspecified with alcohol-induced mood disorder: Secondary | ICD-10-CM

## 2023-04-23 DIAGNOSIS — F3130 Bipolar disorder, current episode depressed, mild or moderate severity, unspecified: Secondary | ICD-10-CM | POA: Diagnosis not present

## 2023-04-23 DIAGNOSIS — B9689 Other specified bacterial agents as the cause of diseases classified elsewhere: Secondary | ICD-10-CM

## 2023-04-23 DIAGNOSIS — N898 Other specified noninflammatory disorders of vagina: Secondary | ICD-10-CM

## 2023-04-23 LAB — WET PREP FOR TRICH, YEAST, CLUE

## 2023-04-23 LAB — PREGNANCY, URINE: Preg Test, Ur: NEGATIVE

## 2023-04-23 MED ORDER — METRONIDAZOLE 500 MG PO TABS
500.0000 mg | ORAL_TABLET | Freq: Two times a day (BID) | ORAL | 0 refills | Status: AC
  Filled 2023-04-23 – 2023-04-25 (×4): qty 14, 7d supply, fill #0

## 2023-04-23 MED ORDER — IBUPROFEN 200 MG PO TABS: 400.0000 mg | ORAL_TABLET | Freq: Once | ORAL | Status: DC

## 2023-04-23 MED ORDER — MEGESTROL ACETATE 20 MG PO TABS: 20.0000 mg | ORAL_TABLET | Freq: Every day | ORAL | Status: AC

## 2023-04-23 MED ORDER — NICOTINE 7 MG/24HR TD PT24
MEDICATED_PATCH | TRANSDERMAL | 0 refills | Status: DC
  Filled 2023-04-23: qty 14, 15d supply, fill #0
  Filled 2023-05-16: qty 14, 14d supply, fill #0

## 2023-04-23 MED ORDER — NICOTINE 14 MG/24HR TD PT24
MEDICATED_PATCH | TRANSDERMAL | 0 refills | Status: DC
  Filled 2023-04-23: qty 28, 28d supply, fill #0
  Filled 2023-04-25 (×2): qty 42, 30d supply, fill #0
  Filled 2023-04-25: qty 30, 30d supply, fill #0

## 2023-04-23 NOTE — Patient Instructions (Signed)
 It is common to have vaginal bleeding and cramping for up to 72 hours after your biopsy. Please call our office with heavy vaginal bleeding, severe abdominal pain or fever. Avoid intercourse, tampon use, douching and baths for 7 days to decrease the risk of infection.

## 2023-04-23 NOTE — Progress Notes (Signed)
 53 y.o. [id number] female with prior CDx1+BTL, AUB, uterine fibroids, hypertension, GERD, tobacco use, history of substance abuse, bipolar disorder, anxiety here for EMB. Significant Other x 5 years.  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  She was referred from PCP for uterine fibroids, heavy periods, passing clots.   At 2/10/125 appt, she reported: longstanding history of heavy periods.  However her cycles have become more irregular.  She only had 2 cycles in 2024- April and December. Her last cycle lasted almost 2 months.  She also experienced severe cramping, back pain, lightheadedness. States cycle stopped three days ago.  She stopped the Megace 2 weeks ago and never started [place].  She did not feel that the Megace was helping.  Spotting started again yesterday Desires hysterectomy per 2/11 phone note.  Reviewed with patient today. Reports has been clean from substance abuse for over 30 years.  Has not drink alcohol in 3 years however she had 1 drink last night in preparation for her appointment today.  She is tearful about this during her appointment. Her significant other is supportive of her desires to remain clean.  He is also willing to quit smoking cigarettes while she is also preparing for surgery.  She is motivated to stop drinking and stop smoking.  She is currently smoking half a pack per day.  Patient's last menstrual period was 04/20/2023 (approximate).   04/10/23 Hgb 11.7, Plt 429k 03/11/23 FSH 7, TSH wnl PAP:     Component Value Date/Time   DIAGPAP  04/15/2023 1523    - Negative for Intraepithelial Lesions or Malignancy (NILM)   DIAGPAP - Benign reactive/reparative changes 04/15/2023 1523   DIAGPAP  06/10/2019 1042    - Negative for intraepithelial lesion or malignancy (NILM)   HPVHIGH Negative 04/15/2023 1523   HPVHIGH Negative 06/10/2019 1042   ADEQPAP  04/15/2023 1523    Satisfactory for evaluation; transformation zone component PRESENT.   ADEQPAP  06/10/2019 1042    Satisfactory  for evaluation; transformation zone component PRESENT.   ADEQPAP  12/31/2016 0000    Satisfactory for evaluation  endocervical/transformation zone component PRESENT.   Birth control: BTL Sexually active: yes    GYN HISTORY: No significant history  OB History  Gravida Para Term Preterm AB Living  8 4   4 4   SAB IAB Ectopic Multiple Live Births  2 2       # Outcome Date GA Lbr Len/2nd Weight Sex Type Anes PTL Lv  8 Para           7 Para           6 Para           5 Para           4 SAB           3 SAB           2 IAB           1 IAB             Past Medical History:  Diagnosis Date   Alcoholism (HCC)    Allergy    seasonal   Anxiety    Arthritis    Asthma    Blood transfusion    1989 at [place]   Bronchitis    Chronic bipolar disorder (HCC)    Chronic kidney disease    Depression    bipolar   GERD (gastroesophageal reflux disease)    Headache(784.0)    occasional  Hypertension    Mental disorder    bipolar   MRSA infection 2011   left lower leg   Pancreatitis    Pyelonephritis 10/2010   Recurrent upper respiratory infection (URI)    states she has not been to MD and feels like she has  bronchitis now- greenish sputum   Shortness of breath    sometimes with exertion   Sickle cell anemia (HCC)    sickle cell trait    Sickle cell trait (HCC)    Sleep apnea    CPAP   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   UTI (lower urinary tract infection) 10/2010    Past Surgical History:  Procedure Laterality Date   CESAREAN SECTION     KNEE ARTHROSCOPY  06/15/2011   Procedure: ARTHROSCOPY KNEE;  Surgeon: Budd Palmer, MD;  Location: [facility];  Service: Orthopedics;  Laterality: Left;  LEFT KNEE SCOPE WITH LYSIS OF ADHESIONS AND MANIPULATION   KNEE ARTHROSCOPY WITH MEDIAL MENISECTOMY Left 03/27/2018   Procedure: LEFT KNEE ARTHROSCOPY WITH PARTIAL MEDIAL MENISCECTOMY;  Surgeon: Kathryne Hitch, MD;  Location: [facility];  Service:  Orthopedics;  Laterality: Left;   ORIF TIBIA PLATEAU  03/08/2011   Procedure: OPEN REDUCTION INTERNAL FIXATION (ORIF) TIBIAL PLATEAU;  Surgeon: Budd Palmer;  Location: [facility] OR;  Service: Orthopedics;  Laterality: Left;   TUBAL LIGATION      Current Outpatient Medications on File Prior to Visit  Medication Sig Dispense Refill   atorvastatin (LIPITOR) 20 MG tablet Take 1 tablet (20 mg total) by mouth daily. 90 tablet 1   cetirizine (ZYRTEC) 10 MG tablet Take 1 tablet (10 mg total) by mouth daily. 30 tablet 11   Iron, Ferrous Sulfate, 325 (65 Fe) MG TABS Take 1 tablet (325 mg) by mouth daily. 60 tablet 1   loratadine (CLARITIN) 10 MG tablet Take 1 tablet (10 mg total) by mouth daily. as needed for itching 90 tablet 1   pantoprazole (PROTONIX) 40 MG tablet Take 1 tablet (40 mg total) by mouth daily. 30 tablet 1   triamcinolone cream (KENALOG) 0.1 % Apply 1 Application topically 2 (two) times daily. 30 g 0   valACYclovir (VALTREX) 500 MG tablet Take 1 tablet (500 mg total) by mouth daily. For herpes suppression 90 tablet 1   valsartan-hydrochlorothiazide (DIOVAN-HCT) 80-12.5 MG tablet Take 0.5 tablets by mouth daily. 45 tablet 0   No current facility-administered medications on file prior to visit.    Allergies  Allergen Reactions   Aspirin Other (See Comments)    "chilhood allergy"   Banana Hives   Latex Hives   Penicillins Swelling    Has patient had a PCN reaction causing immediate rash, facial/tongue/throat swelling, SOB or lightheadedness with hypotension: unknown Has patient had a PCN reaction causing severe rash involving mucus membranes or skin necrosis: unknown Has patient had a PCN reaction that required hospitalization unknown Has patient had a PCN reaction occurring within the last 10 years: no If all of the above answers are "NO", then may proceed with Cephalosporin use.    Septra [Bactrim] Itching and Swelling   Shellfish Allergy Other (See Comments)    No "reaction"  tested positive   Strawberry Extract Hives   Tape Other (See Comments)    Skin peels away if paper tape is left on for long period of time      PE Today's Vitals   04/23/23 0812  BP: (!) 142/80  Pulse: 82  Temp: 98.8 F (37.1 C)  TempSrc: Oral  SpO2: 98%  Weight: 203 lb (92.1 kg)   Body mass index is 29.39 kg/m.  Physical Exam Vitals reviewed. Exam conducted with a chaperone present.  Constitutional:      General: She is not in acute distress.    Appearance: Normal appearance.  HENT:     Head: Normocephalic and atraumatic.     Nose: Nose normal.  Eyes:     Extraocular Movements: Extraocular movements intact.     Conjunctiva/sclera: Conjunctivae normal.  Pulmonary:     Effort: Pulmonary effort is normal.  Genitourinary:    General: Normal vulva.     Exam position: Lithotomy position.     Vagina: Vaginal discharge present.     Cervix: Normal. No cervical motion tenderness, discharge or lesion.     Uterus: Normal. Not enlarged and not tender.      Adnexa: Right adnexa normal and left adnexa normal.  Musculoskeletal:        General: Normal range of motion.     Cervical back: Normal range of motion.  Neurological:     General: No focal deficit present.     Mental Status: She is alert.  Psychiatric:        Mood and Affect: Mood normal.        Behavior: Behavior normal.      03/26/23 TVUS: Narrative & Impression  CLINICAL DATA:  Pelvic pain.  Vaginal bleeding   EXAM: TRANSABDOMINAL AND TRANSVAGINAL ULTRASOUND OF PELVIS   DOPPLER ULTRASOUND OF OVARIES   TECHNIQUE: Both transabdominal and transvaginal ultrasound examinations of the pelvis were performed. Transabdominal technique was performed for global imaging of the pelvis including uterus, ovaries, adnexal regions, and pelvic cul-de-sac.   It was necessary to proceed with endovaginal exam following the transabdominal exam to visualize the endometrium and adnexa. Color and duplex Doppler ultrasound was  utilized to evaluate blood flow to the ovaries.   COMPARISON:  CT 10/29/2021.   FINDINGS: Transabdominal images are limited by overlapping bowel gas and soft tissue.   Uterus   Measurements: 10.4 x 9.3 x 6.1 cm = volume: 307.5 mL. Lobular myometrium with mass lesions consistent with fibroids. Example left fundal measures 5.5 x 5.3 x 7.6 cm. Central left 4.6 x 3.5 x 4.4 cm and right 2.7 x 2.7 x 2.5 cm. These are appear larger than the prior CT scan of 2023.   Endometrium   Thickness: 13 mm.  Borderline thickened   Right ovary   Measurements: Obscured by bowel gas and soft tissue   Left ovary   Measurements: Obscured by bowel gas and soft tissue   Pulsed Doppler evaluation was not performed as the ovaries themselves are not well seen   Other findings   No abnormal free fluid.   IMPRESSION: Multiple uterine fibroids. The largest measures up to 7.6 cm. These are slightly larger than the prior CT scan. Please correlate with clinical history and if needed additional workup with MRI to further delineate enhancement and signal characteristics.   Borderline thickened endometrium at 13 mm.   Poor visualization of the ovaries. No separate adnexal mass. The ovaries could also be evaluated on additional imaging as clinically appropriate    Procedure Consent was signed. Speculum inserted into the vagina, cervix visualized and was prepped with Betadine.  Patient declined cervical block. The 3 mm pipelle was introduced into the endometrial cavity without difficulty to a depth of 10cm, suction initiated and a moderate amount of tissue was  obtained over 2 passes and sent to pathology.  The instruments were removed from the patient's vagina.  Minimal bleeding from the cervix was noted.  The patient tolerated the procedure well.  Oral ibuprofen 400 mg once was provided at end of the procedure (Lot [id number], exp 2026-09).    Assessment and Plan:        Abnormal uterine bleeding  (AUB) -     Endometrial biopsy -     Megestrol Acetate; Take 1 tablet (20 mg total) by mouth daily. -     Surgical pathology -     Pregnancy, urine -     metroNIDAZOLE; Take 1 tablet (500 mg total) by mouth 2 (two) times daily for 7 days.  Dispense: 14 tablet; Refill: 0  Cigarette nicotine dependence with nicotine-induced disorder -     Nicotine; RX #1 Weeks 1-6: 14 mg x 1 patch daily. Wear for 24 hours. If you have sleep disturbances, remove at bedtime.  Dispense: 42 patch; Refill: 0 -     Nicotine; RX #2 Weeks 7-8: 7 mg x 1 patch dailyWear for 24 hours. If you have sleep disturbances, remove at bedtime..  Dispense: 14 patch; Refill: 0  Vaginal discharge -     WET PREP FOR TRICH, YEAST, CLUE  BV (bacterial vaginosis) -     metroNIDAZOLE; Take 1 tablet (500 mg total) by mouth 2 (two) times daily for 7 days.  Dispense: 14 tablet; Refill: 0  Bipolar I disorder, most recent episode depressed (HCC) -     Ambulatory referral to Psychiatry  Alcohol-induced mood disorder (HCC) -     Ambulatory referral to Psychiatry  Other orders -     Ibuprofen    Patient to return office in 6 weeks to evaluate smoking cessation and plan for surgical scheduling at that time. Patient to continue Megace 20 mg once daily during this timeframe. Continue daily iron. Psych referral placed for history of substance abuse with recent alcohol use.  Rosalyn Gess, MD

## 2023-04-24 ENCOUNTER — Inpatient Hospital Stay (HOSPITAL_COMMUNITY): Admit: 2023-04-24 | Payer: MEDICAID

## 2023-04-24 ENCOUNTER — Other Ambulatory Visit: Payer: Self-pay

## 2023-04-24 LAB — SURGICAL PATHOLOGY

## 2023-04-25 ENCOUNTER — Other Ambulatory Visit: Payer: Self-pay

## 2023-04-25 ENCOUNTER — Encounter: Payer: MEDICAID | Admitting: Obstetrics and Gynecology

## 2023-04-25 ENCOUNTER — Encounter: Payer: Self-pay | Admitting: Obstetrics and Gynecology

## 2023-04-25 ENCOUNTER — Other Ambulatory Visit (HOSPITAL_COMMUNITY): Payer: Self-pay

## 2023-04-26 ENCOUNTER — Other Ambulatory Visit: Payer: Self-pay

## 2023-04-29 ENCOUNTER — Other Ambulatory Visit: Payer: Self-pay

## 2023-05-06 ENCOUNTER — Encounter: Payer: Self-pay | Admitting: Orthopedic Surgery

## 2023-05-06 ENCOUNTER — Other Ambulatory Visit (INDEPENDENT_AMBULATORY_CARE_PROVIDER_SITE_OTHER): Payer: MEDICAID

## 2023-05-06 ENCOUNTER — Encounter: Payer: Self-pay | Admitting: Obstetrics and Gynecology

## 2023-05-06 ENCOUNTER — Ambulatory Visit: Payer: MEDICAID | Admitting: Orthopedic Surgery

## 2023-05-06 ENCOUNTER — Other Ambulatory Visit: Payer: MEDICAID

## 2023-05-06 ENCOUNTER — Other Ambulatory Visit: Payer: Self-pay

## 2023-05-06 DIAGNOSIS — M25532 Pain in left wrist: Secondary | ICD-10-CM

## 2023-05-06 DIAGNOSIS — G5603 Carpal tunnel syndrome, bilateral upper limbs: Secondary | ICD-10-CM

## 2023-05-06 DIAGNOSIS — M25531 Pain in right wrist: Secondary | ICD-10-CM
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
## 2023-05-09 ENCOUNTER — Ambulatory Visit: Payer: MEDICAID | Admitting: Obstetrics and Gynecology

## 2023-05-14 ENCOUNTER — Ambulatory Visit (INDEPENDENT_AMBULATORY_CARE_PROVIDER_SITE_OTHER): Payer: MEDICAID | Admitting: Physical Medicine and Rehabilitation

## 2023-05-14 DIAGNOSIS — M79641 Pain in right hand: Secondary | ICD-10-CM | POA: Diagnosis not present

## 2023-05-14 DIAGNOSIS — M79642 Pain in left hand: Secondary | ICD-10-CM | POA: Diagnosis not present

## 2023-05-14 DIAGNOSIS — R202 Paresthesia of skin: Secondary | ICD-10-CM

## 2023-05-14 NOTE — Progress Notes (Unsigned)
 Pain Scale   Average Pain 8 Patient advising she had bilateral numbness and tingling arms/hands Right hand dominate        +Driver, -BT, -Dye Allergies.
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
## 2023-05-15 NOTE — Procedures (Unsigned)
 EMG & NCV Findings: Evaluation of the left median (across palm) sensory and the right median (across palm) sensory nerves showed prolonged distal peak latency (Wrist, L3.9, R4.0 ms).  All remaining nerves (as indicated in the following tables) were within normal limits.  All left vs. right side differences were within normal limits.    All examined muscles (as indicated in the following table) showed no evidence of electrical instability.    Impression: The above electrodiagnostic study is ABNORMAL and reveals evidence of a mild bilateral median nerve entrapment at the wrist (carpal tunnel syndrome) affecting sensory components.   There is no significant electrodiagnostic evidence of any other focal nerve entrapment, brachial plexopathy or cervical radiculopathy.  As you know, this particular electrodiagnostic study cannot rule out chemical radiculitis or sensory only radiculopathy. **This electrodiagnostic study cannot rule out small fiber polyneuropathy and dysesthesias from central pain syndromes such as stroke or central pain sensitization syndromes such as fibromyalgia.  Myotomal referral pain from trigger points is also not excluded.  Recommendations: 1.  Follow-up with referring physician. 2.  Continue current management of symptoms. 3.  Continue use of resting splint at night-time and as needed during the day.  ___________________________ Elease Hashimoto Board Certified, American Board of Physical Medicine and Rehabilitation    Nerve Conduction Studies Anti Sensory Summary Table   Stim Site NR Peak (ms) Norm Peak (ms) P-T Amp (V) Norm P-T Amp Site1 Site2 Delta-P (ms) Dist (cm) Vel (m/s) Norm Vel (m/s)  Left Median Acr Palm Anti Sensory (2nd Digit)  29.7C  Wrist    *3.9 <3.6 20.8 >10 Wrist Palm 2.0 0.0    Palm    1.9 <2.0 23.1         Right Median Acr Palm Anti Sensory (2nd Digit)  29.7C  Wrist    *4.0 <3.6 25.7 >10 Wrist Palm 2.1 0.0    Palm    1.9 <2.0 34.4         Left  Radial Anti Sensory (Base 1st Digit)  30.2C  Wrist    2.1 <3.1 28.9  Wrist Base 1st Digit 2.1 0.0    Right Radial Anti Sensory (Base 1st Digit)  29.5C  Wrist    2.2 <3.1 19.4  Wrist Base 1st Digit 2.2 0.0    Left Ulnar Anti Sensory (5th Digit)  30.4C  Wrist    3.2 <3.7 20.0 >15.0 Wrist 5th Digit 3.2 14.0 44 >38  Right Ulnar Anti Sensory (5th Digit)  30C  Wrist    3.4 <3.7 17.6 >15.0 Wrist 5th Digit 3.4 14.0 41 >38   Motor Summary Table   Stim Site NR Onset (ms) Norm Onset (ms) O-P Amp (mV) Norm O-P Amp Site1 Site2 Delta-0 (ms) Dist (cm) Vel (m/s) Norm Vel (m/s)  Left Median Motor (Abd Poll Brev)  30.8C  Wrist    3.4 <4.2 10.2 >5 Elbow Wrist 4.1 21.0 51 >50  Elbow    7.5  10.2         Right Median Motor (Abd Poll Brev)  30.1C  Wrist    3.5 <4.2 10.9 >5 Elbow Wrist 4.1 20.5 50 >50  Elbow    7.6  7.7         Left Ulnar Motor (Abd Dig Min)  31.1C  Wrist    3.4 <4.2 10.5 >3 B Elbow Wrist 3.8 20.0 53 >53  B Elbow    7.2  7.4  A Elbow B Elbow 1.6 11.0 69 >53  A Elbow  8.8  9.4         Right Ulnar Motor (Abd Dig Min)  30.3C  Wrist    2.8 <4.2 11.3 >3 B Elbow Wrist 3.6 20.0 56 >53  B Elbow    6.4  10.6  A Elbow B Elbow 1.6 11.0 69 >53  A Elbow    8.0  8.9          EMG   Side Muscle Nerve Root Ins Act Fibs Psw Amp Dur Poly Recrt Int Dennie Bible Comment  Right Abd Poll Brev Median C8-T1 Nml Nml Nml Nml Nml 0 Nml Nml   Right 1stDorInt Ulnar C8-T1 Nml Nml Nml Nml Nml 0 Nml Nml   Right PronatorTeres Median C6-7 Nml Nml Nml Nml Nml 0 Nml Nml   Right Biceps Musculocut C5-6 Nml Nml Nml Nml Nml 0 Nml Nml   Right Deltoid Axillary C5-6 Nml Nml Nml Nml Nml 0 Nml Nml     Nerve Conduction Studies Anti Sensory Left/Right Comparison   Stim Site L Lat (ms) R Lat (ms) L-R Lat (ms) L Amp (V) R Amp (V) L-R Amp (%) Site1 Site2 L Vel (m/s) R Vel (m/s) L-R Vel (m/s)  Median Acr Palm Anti Sensory (2nd Digit)  29.7C  Wrist *3.9 *4.0 0.1 20.8 25.7 19.1 Wrist Palm     Palm 1.9 1.9 0.0 23.1 34.4 32.8        Radial Anti Sensory (Base 1st Digit)  30.2C  Wrist 2.1 2.2 0.1 28.9 19.4 32.9 Wrist Base 1st Digit     Ulnar Anti Sensory (5th Digit)  30.4C  Wrist 3.2 3.4 0.2 20.0 17.6 12.0 Wrist 5th Digit 44 41 3   Motor Left/Right Comparison   Stim Site L Lat (ms) R Lat (ms) L-R Lat (ms) L Amp (mV) R Amp (mV) L-R Amp (%) Site1 Site2 L Vel (m/s) R Vel (m/s) L-R Vel (m/s)  Median Motor (Abd Poll Brev)  30.8C  Wrist 3.4 3.5 0.1 10.2 10.9 6.4 Elbow Wrist 51 50 1  Elbow 7.5 7.6 0.1 10.2 7.7 24.5       Ulnar Motor (Abd Dig Min)  31.1C  Wrist 3.4 2.8 0.6 10.5 11.3 7.1 B Elbow Wrist 53 56 3  B Elbow 7.2 6.4 0.8 7.4 10.6 30.2 A Elbow B Elbow 69 69 0  A Elbow 8.8 8.0 0.8 9.4 8.9 5.3          Waveforms:

## 2023-05-16 ENCOUNTER — Other Ambulatory Visit: Payer: Self-pay

## 2023-05-16 ENCOUNTER — Ambulatory Visit: Payer: MEDICAID | Admitting: Family Medicine

## 2023-05-17 ENCOUNTER — Ambulatory Visit: Payer: MEDICAID | Admitting: Occupational Therapy

## 2023-05-17 ENCOUNTER — Telehealth (HOSPITAL_COMMUNITY): Payer: Self-pay

## 2023-05-17 ENCOUNTER — Encounter: Payer: Self-pay | Admitting: Physical Medicine and Rehabilitation

## 2023-05-17 NOTE — Telephone Encounter (Signed)
 PT called to confirm appointment - when speaking with PT I informed her of the [facility] insurance - PT became upset and said she doesn't have AMBETTER - I explained to the PT it is in the system and showing PT then  hung up the phone

## 2023-05-20 ENCOUNTER — Encounter: Payer: Self-pay | Admitting: Obstetrics and Gynecology

## 2023-05-20 ENCOUNTER — Other Ambulatory Visit: Payer: Self-pay

## 2023-05-20 ENCOUNTER — Ambulatory Visit (INDEPENDENT_AMBULATORY_CARE_PROVIDER_SITE_OTHER): Payer: MEDICAID | Admitting: Obstetrics and Gynecology

## 2023-05-20 VITALS — BP 144/80 | HR 83 | Temp 98.2°F | Wt 207.0 lb

## 2023-05-20 DIAGNOSIS — F17219 Nicotine dependence, cigarettes, with unspecified nicotine-induced disorders: Secondary | ICD-10-CM | POA: Diagnosis not present

## 2023-05-20 DIAGNOSIS — D251 Intramural leiomyoma of uterus: Secondary | ICD-10-CM

## 2023-05-20 DIAGNOSIS — F1094 Alcohol use, unspecified with alcohol-induced mood disorder: Secondary | ICD-10-CM | POA: Diagnosis not present

## 2023-05-20 DIAGNOSIS — N939 Abnormal uterine and vaginal bleeding, unspecified: Secondary | ICD-10-CM

## 2023-05-20 NOTE — Progress Notes (Signed)
 53 y.o. [id number] female with prior CDx1+BTL, AUB on megace, uterine fibroids, hypertension, GERD, tobacco use, history of substance abuse, bipolar disorder, anxiety here for follow-up appointment. Significant Other x 5 years.  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  She was referred from PCP for uterine fibroids, heavy periods, passing clots.   At 2/10/125 appt, she reported: longstanding history of heavy periods.  However her cycles have become more irregular.  She only had 2 cycles in 2024- April and December. Her last cycle lasted almost 2 months.  She also experienced severe cramping, back pain, lightheadedness. States cycle stopped three days ago.  She stopped the Megace 2 weeks ago and never started [place].  She did not feel that the Megace was helping.  At 04/15/23 appt, we also discussed "Reports has been clean from substance abuse for over 30 years.  Has not drink alcohol in 3 years however she had 1 drink last night in preparation for her appointment today.  She is tearful about this during her appointment. Her significant other is supportive of her desires to remain clean.  He is also willing to quit smoking cigarettes while she is also preparing for surgery.  She is motivated to stop drinking and stop smoking.  She is currently smoking half a pack per day."  Today, she reports spotting started again yesterday on megace. Using pantyliner as needed.  Desires hysterectomy per 2/11 phone note.  Reviewed with patient today.  Nicoderm patches cause her to have nightmares. She wore it for one week but stopped taking it due to nightmares. She has reduced the amount of cigarettes to 3 per day from 10/day. Discuss surgery to remove fibroids.  EtOH use: none since last appt Appt/ref- issue with scheduling due to insurance  No CP and SOB. Nonproductive, chronic cough for ~1 wk. Neg COVID/Flu testing 03/26/23.  Patient's last menstrual period was 02/17/2023 (approximate).   04/10/23 Hgb 11.7, Plt 429k 03/11/23 FSH  7, TSH wnl  03/26/23 TVUS:   Uterus  Measurements: 10.4 x 9.3 x 6.1 cm = volume: 307.5 mL. Lobular myometrium with mass lesions consistent with fibroids. Example left fundal measures 5.5 x 5.3 x 7.6 cm. Central left 4.6 x 3.5 x 4.4 cm and right 2.7 x 2.7 x 2.5 cm. These are appear larger than the prior CT scan of 2023.   Endometrium  Thickness: 13 mm.  Borderline thickened   Right and left ovary  Measurements: Obscured by bowel gas and soft tissue. Pulsed Doppler evaluation was not performed as the ovaries themselves are not well seen   Other findings  No abnormal free fluid.   IMPRESSION: Multiple uterine fibroids. The largest measures up to 7.6 cm. These are slightly larger than the prior CT scan. Please correlate with clinical history and if needed additional workup with MRI to further delineate enhancement and signal characteristics.   Borderline thickened endometrium at 13 mm.   Poor visualization of the ovaries.      Component Value Date/Time   DIAGPAP  04/15/2023 1523    - Negative for Intraepithelial Lesions or Malignancy (NILM)   DIAGPAP - Benign reactive/reparative changes 04/15/2023 1523   DIAGPAP  06/10/2019 1042    - Negative for intraepithelial lesion or malignancy (NILM)   HPVHIGH Negative 04/15/2023 1523   HPVHIGH Negative 06/10/2019 1042   ADEQPAP  04/15/2023 1523    Satisfactory for evaluation; transformation zone component PRESENT.   ADEQPAP  06/10/2019 1042    Satisfactory for evaluation; transformation zone component PRESENT.  ADEQPAP  12/31/2016 0000    Satisfactory for evaluation  endocervical/transformation zone component PRESENT.  04/23/23: EMB benign  Birth control: BTL Sexually active: yes    GYN HISTORY: No significant history  OB History  Gravida Para Term Preterm AB Living  8 4   4 4   SAB IAB Ectopic Multiple Live Births  2 2       # Outcome Date GA Lbr Len/2nd Weight Sex Type Anes PTL Lv  8 Para           7 Para           6  Para           5 Para           4 SAB           3 SAB           2 IAB           1 IAB             Past Medical History:  Diagnosis Date   Alcoholism (HCC)    Allergy    seasonal   Anxiety    Arthritis    Asthma    Blood transfusion    1989 at [place]   Bronchitis    Chronic bipolar disorder (HCC)    Chronic kidney disease    Depression    bipolar   GERD (gastroesophageal reflux disease)    Headache(784.0)    occasional   Hypertension    Mental disorder    bipolar   MRSA infection 2011   left lower leg   Pancreatitis    Pyelonephritis 10/2010   Recurrent upper respiratory infection (URI)    states she has not been to MD and feels like she has  bronchitis now- greenish sputum   Shortness of breath    sometimes with exertion   Sickle cell trait (HCC)    Sleep apnea    CPAP   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   UTI (lower urinary tract infection) 10/2010    Past Surgical History:  Procedure Laterality Date   CESAREAN SECTION     KNEE ARTHROSCOPY  06/15/2011   Procedure: ARTHROSCOPY KNEE;  Surgeon: Budd Palmer, MD;  Location: [facility] OR;  Service: Orthopedics;  Laterality: Left;  LEFT KNEE SCOPE WITH LYSIS OF ADHESIONS AND MANIPULATION   KNEE ARTHROSCOPY WITH MEDIAL MENISECTOMY Left 03/27/2018   Procedure: LEFT KNEE ARTHROSCOPY WITH PARTIAL MEDIAL MENISCECTOMY;  Surgeon: Kathryne Hitch, MD;  Location: [facility];  Service: Orthopedics;  Laterality: Left;   ORIF TIBIA PLATEAU  03/08/2011   Procedure: OPEN REDUCTION INTERNAL FIXATION (ORIF) TIBIAL PLATEAU;  Surgeon: Budd Palmer;  Location: [facility] OR;  Service: Orthopedics;  Laterality: Left;   TUBAL LIGATION      Current Outpatient Medications on File Prior to Visit  Medication Sig Dispense Refill   atorvastatin (LIPITOR) 20 MG tablet Take 1 tablet (20 mg total) by mouth daily. 90 tablet 1   Iron, Ferrous Sulfate, 325 (65 Fe) MG TABS Take 1 tablet (325 mg) by mouth daily. 60  tablet 1   valsartan-hydrochlorothiazide (DIOVAN-HCT) 80-12.5 MG tablet Take 0.5 tablets by mouth daily. 45 tablet 0   No current facility-administered medications on file prior to visit.    Allergies  Allergen Reactions   Aspirin Other (See Comments)    "chilhood allergy"   Banana Hives   Latex Hives  Penicillins Swelling    Has patient had a PCN reaction causing immediate rash, facial/tongue/throat swelling, SOB or lightheadedness with hypotension: unknown Has patient had a PCN reaction causing severe rash involving mucus membranes or skin necrosis: unknown Has patient had a PCN reaction that required hospitalization unknown Has patient had a PCN reaction occurring within the last 10 years: no If all of the above answers are "NO", then may proceed with Cephalosporin use.    Septra [Bactrim] Itching and Swelling   Shellfish Allergy Other (See Comments)    No "reaction" tested positive   Strawberry Extract Hives   Tape Other (See Comments)    Skin peels away if paper tape is left on for long period of time      PE Today's Vitals   05/20/23 1443  BP: (!) 144/80  Pulse: 83  Temp: 98.2 F (36.8 C)  TempSrc: Oral  SpO2: 98%  Weight: 207 lb (93.9 kg)   Body mass index is 29.97 kg/m.  Physical Exam Vitals reviewed.  Constitutional:      General: She is not in acute distress.    Appearance: Normal appearance.  HENT:     Head: Normocephalic and atraumatic.     Nose: Nose normal.  Eyes:     Extraocular Movements: Extraocular movements intact.     Conjunctiva/sclera: Conjunctivae normal.  Pulmonary:     Effort: Pulmonary effort is normal.  Musculoskeletal:        General: Normal range of motion.     Cervical back: Normal range of motion.  Neurological:     General: No focal deficit present.     Mental Status: She is alert.  Psychiatric:        Mood and Affect: Mood normal.        Behavior: Behavior normal.     Assessment and Plan:        Abnormal uterine  bleeding (AUB) Assessment & Plan: Plan for robotic assisted total laparoscopic hysterectomy and bilateral salpingectomy. Discussed outpatient procedure. Reviewed that  recovery is usually 6 weeks with additional 4 weeks of pelvic rest. Risks including infections, bleeding, and damage to surrounding organs reviewed. Patient in agreement with initial plan. Orders placed for surgery. RTO for preop visit. Tubal and hysterectomy form signed today.  Smoking cessation encouraged. Patient to continue Megace 20 mg once daily during this timeframe. Continue daily iron. Psych referral placed for history of substance abuse with recent alcohol use.  Orders: -     Ambulatory Referral For Surgery Scheduling  Cigarette nicotine dependence with nicotine-induced disorder  Intramural leiomyoma of uterus -     Ambulatory Referral For Surgery Scheduling  Alcohol-induced mood disorder [facility])  As noted above Message sent to [place] at [facility]. Message also left with outpatient referrals line at [facility]- [phone], option 6 > option 1.   Rosalyn Gess, MD

## 2023-05-21 ENCOUNTER — Ambulatory Visit (HOSPITAL_COMMUNITY): Payer: MEDICAID | Admitting: Student

## 2023-05-22 ENCOUNTER — Ambulatory Visit (INDEPENDENT_AMBULATORY_CARE_PROVIDER_SITE_OTHER): Payer: MEDICAID | Admitting: Nurse Practitioner

## 2023-05-22 ENCOUNTER — Encounter: Payer: Self-pay | Admitting: Nurse Practitioner

## 2023-05-22 ENCOUNTER — Other Ambulatory Visit: Payer: Self-pay

## 2023-05-22 VITALS — BP 138/84 | HR 74 | Temp 98.0°F | Ht 69.0 in | Wt 207.2 lb

## 2023-05-22 DIAGNOSIS — Z6830 Body mass index (BMI) 30.0-30.9, adult: Secondary | ICD-10-CM

## 2023-05-22 DIAGNOSIS — G4733 Obstructive sleep apnea (adult) (pediatric): Secondary | ICD-10-CM

## 2023-05-22 DIAGNOSIS — R0981 Nasal congestion: Secondary | ICD-10-CM

## 2023-05-22 DIAGNOSIS — E66811 Obesity, class 1: Secondary | ICD-10-CM

## 2023-05-22 DIAGNOSIS — R1310 Dysphagia, unspecified: Secondary | ICD-10-CM | POA: Diagnosis not present

## 2023-05-22 DIAGNOSIS — Z1239 Encounter for other screening for malignant neoplasm of breast: Secondary | ICD-10-CM | POA: Insufficient documentation

## 2023-05-22 LAB — HEMOGLOBIN A1C: Hgb A1c MFr Bld: 5.5 % (ref 4.6–6.5)

## 2023-05-22 LAB — CBC WITH DIFFERENTIAL/PLATELET
Basophils Absolute: 0.1 10*3/uL (ref 0.0–0.1)
Basophils Relative: 0.8 % (ref 0.0–3.0)
Eosinophils Absolute: 0.1 10*3/uL (ref 0.0–0.7)
Eosinophils Relative: 1.5 % (ref 0.0–5.0)
HCT: 38.6 % (ref 36.0–46.0)
Hemoglobin: 12.8 g/dL (ref 12.0–15.0)
Lymphocytes Relative: 34.5 % (ref 12.0–46.0)
Lymphs Abs: 2.8 10*3/uL (ref 0.7–4.0)
MCHC: 33.1 g/dL (ref 30.0–36.0)
MCV: 87.8 fl (ref 78.0–100.0)
Monocytes Absolute: 0.6 10*3/uL (ref 0.1–1.0)
Monocytes Relative: 6.9 % (ref 3.0–12.0)
Neutro Abs: 4.5 10*3/uL (ref 1.4–7.7)
Neutrophils Relative %: 56.3 % (ref 43.0–77.0)
Platelets: 308 10*3/uL (ref 150.0–400.0)
RBC: 4.4 Mil/uL (ref 3.87–5.11)
RDW: 17.6 % — ABNORMAL HIGH (ref 11.5–15.5)
WBC: 8 10*3/uL (ref 4.0–10.5)

## 2023-05-22 LAB — COMPREHENSIVE METABOLIC PANEL
ALT: 11 U/L (ref 0–35)
AST: 16 U/L (ref 0–37)
Albumin: 4.5 g/dL (ref 3.5–5.2)
Alkaline Phosphatase: 104 U/L (ref 39–117)
BUN: 8 mg/dL (ref 6–23)
CO2: 27 meq/L (ref 19–32)
Calcium: 9.8 mg/dL (ref 8.4–10.5)
Chloride: 104 meq/L (ref 96–112)
Creatinine, Ser: 0.65 mg/dL (ref 0.40–1.20)
GFR: 100.69 mL/min (ref >60.00)
Glucose, Bld: 108 mg/dL — ABNORMAL HIGH (ref 70–99)
Potassium: 3.5 meq/L (ref 3.5–5.1)
Sodium: 139 meq/L (ref 135–145)
Total Bilirubin: 0.4 mg/dL (ref 0.2–1.2)
Total Protein: 8 g/dL (ref 6.0–8.3)

## 2023-05-22 LAB — LIPID PANEL
Cholesterol: 154 mg/dL (ref 0–200)
HDL: 68.4 mg/dL (ref >39.00)
LDL Cholesterol: 73 mg/dL (ref 0–99)
NonHDL: 86.02
Total CHOL/HDL Ratio: 2
Triglycerides: 66 mg/dL (ref 0.0–149.0)
VLDL: 13.2 mg/dL (ref 0.0–40.0)

## 2023-05-22 LAB — TSH: TSH: 1.19 u[IU]/mL (ref 0.35–5.50)

## 2023-05-22 MED ORDER — FLUTICASONE PROPIONATE 50 MCG/ACT NA SUSP
2.0000 | Freq: Every day | NASAL | 6 refills | Status: AC
  Filled 2023-05-22 – 2023-05-23 (×2): qty 16, 30d supply, fill #0
  Filled 2023-06-04 – 2023-06-17 (×4): qty 16, 30d supply, fill #1
  Filled 2023-07-19: qty 16, 30d supply, fill #2
  Filled 2023-09-18: qty 16, 30d supply, fill #3
  Filled 2023-10-13 – 2023-12-12 (×2): qty 16, 30d supply, fill #4
  Filled 2024-01-21: qty 16, 30d supply, fill #5

## 2023-05-22 MED ORDER — LEVOCETIRIZINE DIHYDROCHLORIDE 5 MG PO TABS
5.0000 mg | ORAL_TABLET | Freq: Every evening | ORAL | 1 refills | Status: DC
  Filled 2023-05-22 – 2023-05-23 (×2): qty 30, 30d supply, fill #0
  Filled 2023-06-04 – 2023-06-17 (×4): qty 30, 30d supply, fill #1
  Filled 2023-07-19: qty 30, 30d supply, fill #2
  Filled 2023-09-18: qty 30, 30d supply, fill #3
  Filled 2023-10-03 – 2023-11-06 (×3): qty 30, 30d supply, fill #4
  Filled 2023-12-12: qty 30, 30d supply, fill #5
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
## 2023-05-22 NOTE — Assessment & Plan Note (Signed)
 Order for screening mammogram signed today.

## 2023-05-22 NOTE — Assessment & Plan Note (Signed)
 Chronic Difficult to determine if this could be due to esophageal stricture.  Will refer to gastroenterology for further assistance with evaluation.

## 2023-05-22 NOTE — Assessment & Plan Note (Signed)
 Chronic Refer to allergist to determine if she is allergic to mold In the meantime we will trial Xyzal by mouth at bedtime daily and Flonase nasal spray daily Follow-up in 1 month

## 2023-05-22 NOTE — Assessment & Plan Note (Signed)
 Chronic Labs ordered, further recommendations may be made based upon these results

## 2023-05-22 NOTE — Assessment & Plan Note (Signed)
 Chronic Currently not being treated with CPAP Will refer to sleep specialty with [facility] for further assistance with management.

## 2023-05-23 ENCOUNTER — Encounter: Payer: Self-pay | Admitting: Nurse Practitioner

## 2023-05-23 ENCOUNTER — Other Ambulatory Visit: Payer: Self-pay

## 2023-05-23 ENCOUNTER — Telehealth: Payer: Self-pay | Admitting: Nurse Practitioner

## 2023-05-23 ENCOUNTER — Other Ambulatory Visit (HOSPITAL_COMMUNITY): Payer: Self-pay

## 2023-05-23 ENCOUNTER — Ambulatory Visit: Payer: MEDICAID | Attending: Orthopedic Surgery | Admitting: Occupational Therapy

## 2023-05-23 NOTE — Telephone Encounter (Signed)
 Copied from CRM [id number]. Topic: General - Other >> May 23, 2023  9:53 AM Deaijah H wrote: Reason for CRM: Patient is calling for a doctor's note for work. If needed, please call [phone]

## 2023-05-28 ENCOUNTER — Telehealth: Payer: Self-pay | Admitting: Obstetrics and Gynecology

## 2023-05-28 NOTE — Assessment & Plan Note (Signed)
 Plan for robotic assisted total laparoscopic hysterectomy and bilateral salpingectomy. Discussed outpatient procedure. Reviewed that  recovery is usually 6 weeks with additional 4 weeks of pelvic rest. Risks including infections, bleeding, and damage to surrounding organs reviewed. Patient in agreement with initial plan. Orders placed for surgery. RTO for preop visit. Tubal and hysterectomy form signed today.  Smoking cessation encouraged. Patient to continue Megace 20 mg once daily during this timeframe. Continue daily iron. Psych referral placed for history of substance abuse with recent alcohol use.

## 2023-05-28 NOTE — Telephone Encounter (Signed)
 Referral to psych cancelled due to incorrect insurance on file. [name] is patient's new insurance and is listed in the system. Referral mentions different insurance for reason for referral cancellation. Wanted to review this with referral department to get patient in for an appt ASAP.  Message sent to [place] at [facility]. Message also left with outpatient referrals line at [facility]- [phone], option 6 > option 1.   Triage, if they return the call, please review this information with them and assist with patient scheduling.

## 2023-05-29 ENCOUNTER — Other Ambulatory Visit (HOSPITAL_COMMUNITY): Payer: Self-pay

## 2023-05-29 ENCOUNTER — Encounter (HOSPITAL_COMMUNITY): Payer: Self-pay | Admitting: Physician Assistant

## 2023-05-29 ENCOUNTER — Other Ambulatory Visit: Payer: Self-pay

## 2023-05-29 ENCOUNTER — Ambulatory Visit (HOSPITAL_COMMUNITY): Payer: MEDICAID | Admitting: Physician Assistant

## 2023-05-29 ENCOUNTER — Ambulatory Visit
Admission: RE | Admit: 2023-05-29 | Discharge: 2023-05-29 | Disposition: A | Discharge status: Home / Self Care | Payer: MEDICAID | Source: Ambulatory Visit | Attending: Nurse Practitioner | Admitting: Nurse Practitioner

## 2023-05-29 ENCOUNTER — Ambulatory Visit (INDEPENDENT_AMBULATORY_CARE_PROVIDER_SITE_OTHER): Admitting: Primary Care

## 2023-05-29 DIAGNOSIS — F411 Generalized anxiety disorder: Secondary | ICD-10-CM | POA: Diagnosis not present

## 2023-05-29 DIAGNOSIS — F4310 Post-traumatic stress disorder, unspecified: Secondary | ICD-10-CM

## 2023-05-29 DIAGNOSIS — F331 Major depressive disorder, recurrent, moderate: Secondary | ICD-10-CM

## 2023-05-29 DIAGNOSIS — Z1239 Encounter for other screening for malignant neoplasm of breast: Secondary | ICD-10-CM

## 2023-05-29 DIAGNOSIS — F5105 Insomnia due to other mental disorder: Secondary | ICD-10-CM | POA: Diagnosis not present

## 2023-05-29 MED ORDER — QUETIAPINE FUMARATE 100 MG PO TABS
100.0000 mg | ORAL_TABLET | Freq: Every day | ORAL | 1 refills | Status: DC
  Filled 2023-05-29 – 2023-05-30 (×2): qty 30, 30d supply, fill #0

## 2023-05-29 MED ORDER — QUETIAPINE FUMARATE 50 MG PO TABS
ORAL_TABLET | ORAL | 0 refills | Status: DC
  Filled 2023-05-29 – 2023-05-30 (×2): qty 54, 30d supply, fill #0

## 2023-05-29 MED ORDER — TRAZODONE HCL 50 MG PO TABS
50.0000 mg | ORAL_TABLET | Freq: Every day | ORAL | 1 refills | Status: AC
Start: 2023-05-29 — End: ?
  Filled 2023-05-29 – 2023-05-30 (×2): qty 30, 30d supply, fill #0
  Filled 2023-06-20 – 2023-06-21 (×3): qty 30, 30d supply, fill #1

## 2023-05-29 NOTE — Progress Notes (Unsigned)
 Addi medication pended
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
## 2023-05-30 ENCOUNTER — Other Ambulatory Visit (HOSPITAL_COMMUNITY): Payer: Self-pay

## 2023-05-30 ENCOUNTER — Ambulatory Visit: Admitting: Obstetrics and Gynecology

## 2023-05-30 ENCOUNTER — Ambulatory Visit: Payer: MEDICAID | Admitting: Obstetrics and Gynecology

## 2023-05-30 ENCOUNTER — Other Ambulatory Visit: Payer: Self-pay

## 2023-05-30 NOTE — Telephone Encounter (Signed)
 Made pt aware, pt no longer needs note

## 2023-06-04 ENCOUNTER — Other Ambulatory Visit (HOSPITAL_COMMUNITY): Payer: Self-pay

## 2023-06-05 ENCOUNTER — Other Ambulatory Visit (HOSPITAL_COMMUNITY): Payer: Self-pay

## 2023-06-05 ENCOUNTER — Other Ambulatory Visit: Payer: Self-pay

## 2023-06-07 ENCOUNTER — Other Ambulatory Visit (HOSPITAL_COMMUNITY): Payer: Self-pay

## 2023-06-10 ENCOUNTER — Ambulatory Visit: Payer: MEDICAID | Admitting: Occupational Therapy

## 2023-06-14 ENCOUNTER — Telehealth: Payer: Self-pay | Admitting: Nurse Practitioner

## 2023-06-14 NOTE — Telephone Encounter (Signed)
 Please call patient and schedule her for a preop clearance visit with me

## 2023-06-14 NOTE — Telephone Encounter (Signed)
 Called pt and made her aware she needs an office visit for preop clearance. Pt is schedule for 06/19/23

## 2023-06-17 ENCOUNTER — Other Ambulatory Visit (HOSPITAL_COMMUNITY): Payer: Self-pay

## 2023-06-17 ENCOUNTER — Ambulatory Visit: Payer: MEDICAID | Admitting: Occupational Therapy

## 2023-06-17 ENCOUNTER — Other Ambulatory Visit: Payer: Self-pay | Admitting: Internal Medicine

## 2023-06-17 DIAGNOSIS — I10 Essential (primary) hypertension: Secondary | ICD-10-CM

## 2023-06-18 ENCOUNTER — Encounter: Payer: Self-pay | Admitting: Neurology

## 2023-06-18 ENCOUNTER — Other Ambulatory Visit: Payer: Self-pay

## 2023-06-18 ENCOUNTER — Ambulatory Visit (INDEPENDENT_AMBULATORY_CARE_PROVIDER_SITE_OTHER): Payer: MEDICAID | Admitting: Neurology

## 2023-06-18 ENCOUNTER — Ambulatory Visit: Payer: MEDICAID | Attending: Orthopedic Surgery | Admitting: Occupational Therapy

## 2023-06-18 VITALS — BP 130/88 | HR 91 | Ht 68.0 in | Wt 211.0 lb

## 2023-06-18 DIAGNOSIS — R29898 Other symptoms and signs involving the musculoskeletal system: Secondary | ICD-10-CM | POA: Diagnosis present

## 2023-06-18 DIAGNOSIS — N939 Abnormal uterine and vaginal bleeding, unspecified: Secondary | ICD-10-CM | POA: Diagnosis not present

## 2023-06-18 DIAGNOSIS — R278 Other lack of coordination: Secondary | ICD-10-CM | POA: Insufficient documentation

## 2023-06-18 DIAGNOSIS — M6281 Muscle weakness (generalized): Secondary | ICD-10-CM | POA: Insufficient documentation

## 2023-06-18 DIAGNOSIS — M79642 Pain in left hand: Secondary | ICD-10-CM | POA: Diagnosis present

## 2023-06-18 DIAGNOSIS — R29818 Other symptoms and signs involving the nervous system: Secondary | ICD-10-CM | POA: Insufficient documentation

## 2023-06-18 DIAGNOSIS — R208 Other disturbances of skin sensation: Secondary | ICD-10-CM | POA: Diagnosis present

## 2023-06-18 DIAGNOSIS — M25531 Pain in right wrist: Secondary | ICD-10-CM | POA: Insufficient documentation

## 2023-06-18 DIAGNOSIS — M79641 Pain in right hand: Secondary | ICD-10-CM | POA: Insufficient documentation

## 2023-06-18 DIAGNOSIS — F331 Major depressive disorder, recurrent, moderate: Secondary | ICD-10-CM | POA: Diagnosis not present

## 2023-06-18 DIAGNOSIS — M25532 Pain in left wrist: Secondary | ICD-10-CM | POA: Diagnosis not present

## 2023-06-18 DIAGNOSIS — D500 Iron deficiency anemia secondary to blood loss (chronic): Secondary | ICD-10-CM

## 2023-06-18 DIAGNOSIS — G4733 Obstructive sleep apnea (adult) (pediatric): Secondary | ICD-10-CM | POA: Diagnosis not present

## 2023-06-18 DIAGNOSIS — F5105 Insomnia due to other mental disorder: Secondary | ICD-10-CM | POA: Diagnosis not present

## 2023-06-18 DIAGNOSIS — F99 Mental disorder, not otherwise specified: Secondary | ICD-10-CM

## 2023-06-18 DIAGNOSIS — F1094 Alcohol use, unspecified with alcohol-induced mood disorder: Secondary | ICD-10-CM
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
## 2023-06-18 NOTE — Progress Notes (Deleted)
 Nancy Canard, PA-C [address] [place], [place]  [address] Phone: [phone]   Gastroenterology Consultation  Referring Provider:     Zorita Hiss, NP Primary Care Physician:  Zorita Hiss, NP Primary Gastroenterologist:  Nancy Canard, PA-C / *** Reason for Consultation:     Dysphagia        HPI:   [name] is a 53 y.o. y/o female referred for consultation & management  by Zorita Hiss, NP.    Patient is here to evaluate dysphagia.  05/22/2023 labs: Normal CBC with hemoglobin 12.8.  No anemia.  Normal CMP and TSH.  01/2018 EGD by Dr. Savannah Curlin: Widely patent Schatzki's ring.  Small hiatal hernia.  Mild gastritis and duodenitis.  Biopsies showed chronic inactive gastritis.  Biopsies negative for H. pylori and celiac.  09/2019 colonoscopy by Dr. Savannah Curlin: Good prep.  Internal hemorrhoids.  3 small polyps removed.  1 small tubulovillous adenoma, 1 hyperplastic, and 1 colon mucosal polyp, all less than 3 mm.  Mild sigmoid diverticulosis.  Biopsies negative for microscopic colitis.  7-year repeat will be due 09/2026.  Past Medical History:  Diagnosis Date   Alcoholism (HCC)    Allergy    seasonal   Anxiety    Arthritis    Asthma    Blood transfusion    1989 at [place]   Bronchitis    Chronic bipolar disorder (HCC)    Chronic kidney disease    Depression    bipolar   GERD (gastroesophageal reflux disease)    Headache(784.0)    occasional   Hypertension    Mental disorder    bipolar   MRSA infection 2011   left lower leg   Pancreatitis    Pyelonephritis 10/2010   Recurrent upper respiratory infection (URI)    states she has not been to MD and feels like she has  bronchitis now- greenish sputum   Shortness of breath    sometimes with exertion   Sickle cell trait (HCC)    Sleep apnea    CPAP   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   UTI (lower urinary tract infection) 10/2010    Past Surgical History:  Procedure Laterality Date    CESAREAN SECTION     KNEE ARTHROSCOPY  06/15/2011   Procedure: ARTHROSCOPY KNEE;  Surgeon: Arlette Lagos, MD;  Location: [facility];  Service: Orthopedics;  Laterality: Left;  LEFT KNEE SCOPE WITH LYSIS OF ADHESIONS AND MANIPULATION   KNEE ARTHROSCOPY WITH MEDIAL MENISECTOMY Left 03/27/2018   Procedure: LEFT KNEE ARTHROSCOPY WITH PARTIAL MEDIAL MENISCECTOMY;  Surgeon: Arnie Lao, MD;  Location: [facility];  Service: Orthopedics;  Laterality: Left;   ORIF TIBIA PLATEAU  03/08/2011   Procedure: OPEN REDUCTION INTERNAL FIXATION (ORIF) TIBIAL PLATEAU;  Surgeon: Arlette Lagos;  Location: [facility];  Service: Orthopedics;  Laterality: Left;   TUBAL LIGATION      Prior to Admission medications   Medication Sig Start Date End Date Taking? Authorizing Provider  atorvastatin  (LIPITOR) 20 MG tablet Take 1 tablet (20 mg total) by mouth daily. 04/11/23   Newlin, Enobong, MD  fluticasone  (FLONASE ) 50 MCG/ACT nasal spray Place 2 sprays into both nostrils daily. 05/22/23   Zorita Hiss, NP  Iron , Ferrous Sulfate , 325 (65 Fe) MG TABS Take 1 tablet (325 mg) by mouth daily. 04/09/23   Newlin, Enobong, MD  levocetirizine (XYZAL ) 5 MG tablet Take 1 tablet (5  mg total) by mouth every evening. 05/22/23   Zorita Hiss, NP  QUEtiapine  (SEROQUEL ) 100 MG tablet Take 1 tablet (100 mg total) by mouth at bedtime. 06/28/23   Nwoko, Uchenna E, PA  QUEtiapine  (SEROQUEL ) 50 MG tablet Take 1 tablet (50 mg total) by mouth at bedtime for 6 days, THEN 2 tablets (100 mg total) at bedtime for 24 days. 05/29/23 06/29/23  Nwoko, Uchenna E, PA  traZODone  (DESYREL ) 50 MG tablet Take 1 tablet (50 mg total) by mouth at bedtime. 05/29/23   Nwoko, Uchenna E, PA  valsartan -hydrochlorothiazide  (DIOVAN -HCT) 80-12.5 MG tablet Take 0.5 tablets by mouth daily. 03/28/23   Lawrance Presume, MD    Family History  Problem Relation Age of Onset   Thyroid disease Father    Colon cancer Father    Cystic fibrosis Sister    Heart disease  Brother    Esophageal cancer Paternal Grandmother    Anesthesia problems Neg Hx    Breast cancer Neg Hx    Colon polyps Neg Hx    Rectal cancer Neg Hx    Stomach cancer Neg Hx      Social History   Tobacco Use   Smoking status: Every Day    Current packs/day: 0.50    Average packs/day: 0.5 packs/day for 24.0 years (12.0 ttl pk-yrs)    Types: Cigarettes   Smokeless tobacco: Never  Vaping Use   Vaping status: Never Used  Substance Use Topics   Alcohol use: Not Currently    Comment: was drink 2 5th daily up unti 11/13/20   Drug use: Not Currently    Types: "Crack" cocaine    Comment: none since 2000    Allergies as of 06/19/2023 - Review Complete 06/18/2023  Allergen Reaction Noted   Aspirin  Other (See Comments) 01/19/2011   Banana Hives 03/08/2011   Latex Hives 03/08/2011   Penicillins Swelling 01/19/2011   Septra [bactrim] Itching and Swelling 01/19/2011   Shellfish allergy Other (See Comments) 03/08/2011   Strawberry extract Hives 03/08/2011   Tape Other (See Comments) 04/19/2013    Review of Systems:    All systems reviewed and negative except where noted in HPI.   Physical Exam:  LMP 05/24/2023 (Approximate)  Patient's last menstrual period was 05/24/2023 (approximate).  General:   Alert,  Well-developed, well-nourished, pleasant and cooperative in NAD Lungs:  Respirations even and unlabored.  Clear throughout to auscultation.   No wheezes, crackles, or rhonchi. No acute distress. Heart:  Regular rate and rhythm; no murmurs, clicks, rubs, or gallops. Abdomen:  Normal bowel sounds.  No bruits.  Soft, and non-distended without masses, hepatosplenomegaly or hernias noted.  No Tenderness.  No guarding or rebound tenderness.    Neurologic:  Alert and oriented x3;  grossly normal neurologically. Psych:  Alert and cooperative. Normal mood and affect.  Imaging Studies: MM 3D SCREENING MAMMOGRAM BILATERAL BREAST Result Date: 05/31/2023 CLINICAL DATA:  Screening. EXAM:  DIGITAL SCREENING BILATERAL MAMMOGRAM WITH TOMOSYNTHESIS AND CAD TECHNIQUE: Bilateral screening digital craniocaudal and mediolateral oblique mammograms were obtained. Bilateral screening digital breast tomosynthesis was performed. The images were evaluated with computer-aided detection. COMPARISON:  None available. ACR Breast Density Category a: The breasts are almost entirely fatty. FINDINGS: There are no findings suspicious for malignancy. IMPRESSION: No mammographic evidence of malignancy. A result letter of this screening mammogram will be mailed directly to the patient. RECOMMENDATION: Screening mammogram in one year. (Code:SM-B-01Y) BI-RADS CATEGORY  1: Negative. Electronically Signed   By: Gaylon Kea.D.  On: 05/31/2023 10:56    Assessment and Plan:   [name] is a 53 y.o. y/o female has been referred for   1.  Dysphagia  Barium swallow with tablet  2.  History of Schatzki's ring  EGD?  3.  GERD  Start PPI, H2 RB  Follow up ***  Nancy Canard, PA-C
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
## 2023-06-19 ENCOUNTER — Other Ambulatory Visit: Payer: Self-pay

## 2023-06-19 ENCOUNTER — Other Ambulatory Visit: Payer: Self-pay | Admitting: Family Medicine

## 2023-06-19 ENCOUNTER — Ambulatory Visit: Payer: MEDICAID | Admitting: Physician Assistant

## 2023-06-19 ENCOUNTER — Other Ambulatory Visit (HOSPITAL_COMMUNITY): Payer: Self-pay

## 2023-06-19 ENCOUNTER — Ambulatory Visit (INDEPENDENT_AMBULATORY_CARE_PROVIDER_SITE_OTHER): Payer: MEDICAID | Admitting: Nurse Practitioner

## 2023-06-19 VITALS — BP 112/76 | Temp 97.6°F | Ht 68.0 in | Wt 212.0 lb

## 2023-06-19 DIAGNOSIS — R21 Rash and other nonspecific skin eruption: Secondary | ICD-10-CM

## 2023-06-19 DIAGNOSIS — Z01810 Encounter for preprocedural cardiovascular examination: Secondary | ICD-10-CM | POA: Diagnosis not present

## 2023-06-19 DIAGNOSIS — R32 Unspecified urinary incontinence: Secondary | ICD-10-CM

## 2023-06-19 LAB — IRON,TIBC AND FERRITIN PANEL
Ferritin: 23 ng/mL (ref 15–150)
Iron Saturation: 15 % (ref 15–55)
Iron: 53 ug/dL (ref 27–159)
Total Iron Binding Capacity: 359 ug/dL (ref 250–450)
UIBC: 306 ug/dL (ref 131–425)

## 2023-06-19 MED ORDER — INCONTINENCE SUPPLIES MISC
2.0000 | Freq: Every day | 6 refills | Status: AC
  Filled 2023-06-19 – 2023-12-12 (×4): qty 60, fill #0

## 2023-06-19 MED ORDER — TRIAMCINOLONE ACETONIDE 0.1 % EX CREA
1.0000 | TOPICAL_CREAM | Freq: Two times a day (BID) | CUTANEOUS | 1 refills | Status: AC
  Filled 2023-06-19: qty 80, 40d supply, fill #0
  Filled 2023-06-19: qty 80, 30d supply, fill #0
  Filled 2023-07-19: qty 80, 30d supply, fill #1

## 2023-06-19 NOTE — Assessment & Plan Note (Signed)
 Etiology unclear, referral to dermatology made today.  Patient will try topical triamcinolone cream to treat the itching.  Patient was educated to use this for up to 14 days but to discontinue after that.

## 2023-06-19 NOTE — Assessment & Plan Note (Signed)
 Chronic, intermittent Related to PTSD and vivid nightmares. I believe it would be reasonable for patient to use incontinence supplies overnight as needed. Prescription sent to pharmacy.

## 2023-06-19 NOTE — Assessment & Plan Note (Signed)
 Preop risk assessment and examination completed today. She is at moderate risk for complications related to upcoming surgery.  She was notified of this.  She reports that she plans to quit smoking I encouraged her to do so. No need to repeat labs today. Will forward note to Dr. Andrena Ke.

## 2023-06-19 NOTE — Patient Instructions (Signed)
 [name]

## 2023-06-19 NOTE — Progress Notes (Signed)
 Established Patient Office Visit  Subjective   Patient ID: [name], female    DOB: November 09, 1970  Age: 53 y.o. MRN: [id number]  Chief Complaint  Patient presents with   Pre-op Exam    Patient has today for preoperative clearance.  She is planning to undergo laparoscopic hysterectomy, bilateral salpingectomy with Dr. Kennith Center next week.   Surgical risk calculator using ACS NSQIP completed which did identify above average risk for serious complication, any complication, cardiac complication, surgical site infection, VTE, renal failure, readmission, return to the OR, and death.  It identified average risk for UTI.  And below average risk for pneumonia.  Risk factors include history of hypertension, OSA, asthma, smoker, BMI of 32.  Revised cardiac risk index also used and based on independent predictor's of major cardiac complications her risk of cardiac death, nonfatal MI, nonfatal cardiac arrest is around 0.4%.  Risk of MI, pulmonary edema, V-fib, primary cardiac arrest, and heart block based on independent predictors of major cardiac complications is about 0.5%.  Using the [facility] activity status index questionnaire her METS score is 8.9-1 indicating moderate functional capacity.  Labs were completed 05/22/2023 indicating normal kidney function, A1c of 5.5, and no anemia.  Today she reports feeling well, no shortness of breath or chest pain.  She does want to discuss urinary incontinence.  She tells me she has PTSD and a history of bedwetting since childhood.  She reports in adulthood she can have vivid dreams and will have bedwetting related to her nightmares and vivid dreams.  She does use incontinence supplies overnight (depends), and is asking to have prescription for these as paying out-of-pocket is expensive for her.  She also would like to discuss a rash that occurs on her back, chest, and abdomen.  It comes and goes.  It is itchy, she is wondering if this could be eczema.    ROS: see  HPI    Objective:     BP 112/76   Temp 97.6 F (36.4 C) (Temporal)   Ht 5\' 8"  (1.727 m)   Wt 212 lb (96.2 kg)   LMP 05/24/2023 (Approximate)   BMI 32.23 kg/m    Physical Exam Vitals reviewed.  Constitutional:      General: She is not in acute distress.    Appearance: Normal appearance.  HENT:     Head: Normocephalic and atraumatic.  Neck:     Vascular: No carotid bruit.  Cardiovascular:     Rate and Rhythm: Normal rate and regular rhythm.     Pulses: Normal pulses.     Heart sounds: Normal heart sounds.  Pulmonary:     Effort: Pulmonary effort is normal.     Breath sounds: Normal breath sounds.  Skin:    General: Skin is warm and dry.       Neurological:     General: No focal deficit present.     Mental Status: She is alert and oriented to person, place, and time.  Psychiatric:        Mood and Affect: Mood normal.        Behavior: Behavior normal.        Judgment: Judgment normal.    EKG: Normal sinus rhythm  No results found for any visits on 06/19/23.    The 10-year ASCVD risk score (Arnett DK, et al., 2019) is: 3.2%    Assessment & Plan:   Problem List Items Addressed This Visit       Musculoskeletal and Integument  Rash - Primary   Etiology unclear, referral to dermatology made today.  Patient will try topical triamcinolone cream to treat the itching.  Patient was educated to use this for up to 14 days but to discontinue after that.      Relevant Medications   triamcinolone cream (KENALOG) 0.1 %   Other Relevant Orders   Ambulatory referral to Dermatology     Other   Urinary incontinence   Chronic, intermittent Related to PTSD and vivid nightmares. I believe it would be reasonable for patient to use incontinence supplies overnight as needed. Prescription sent to pharmacy.      Relevant Medications   Incontinence Supplies MISC   Preop cardiovascular exam   Preop risk assessment and examination completed today. She is at moderate  risk for complications related to upcoming surgery.  She was notified of this.  She reports that she plans to quit smoking I encouraged her to do so. No need to repeat labs today. Will forward note to Dr. Andrena Ke.       Relevant Orders   EKG 12-Lead    Return in about 6 months (around 12/19/2023) for F/U with Berneda Piccininni.    Zorita Hiss, NP

## 2023-06-19 NOTE — Patient Instructions (Addendum)
 [facility] phone number: [phone]

## 2023-06-20 ENCOUNTER — Telehealth: Payer: Self-pay | Admitting: Nurse Practitioner

## 2023-06-20 ENCOUNTER — Other Ambulatory Visit: Payer: Self-pay

## 2023-06-20 ENCOUNTER — Encounter: Payer: Self-pay | Admitting: Obstetrics and Gynecology

## 2023-06-20 ENCOUNTER — Ambulatory Visit (INDEPENDENT_AMBULATORY_CARE_PROVIDER_SITE_OTHER): Payer: MEDICAID | Admitting: Obstetrics and Gynecology

## 2023-06-20 ENCOUNTER — Telehealth: Payer: Self-pay | Admitting: Neurology

## 2023-06-20 ENCOUNTER — Other Ambulatory Visit (HOSPITAL_COMMUNITY): Payer: Self-pay

## 2023-06-20 VITALS — BP 118/72 | HR 85 | Temp 98.2°F | Wt 212.0 lb

## 2023-06-20 DIAGNOSIS — F17219 Nicotine dependence, cigarettes, with unspecified nicotine-induced disorders: Secondary | ICD-10-CM | POA: Diagnosis not present

## 2023-06-20 DIAGNOSIS — D251 Intramural leiomyoma of uterus: Secondary | ICD-10-CM

## 2023-06-20 DIAGNOSIS — R32 Unspecified urinary incontinence: Secondary | ICD-10-CM

## 2023-06-20 DIAGNOSIS — F3130 Bipolar disorder, current episode depressed, mild or moderate severity, unspecified: Secondary | ICD-10-CM

## 2023-06-20 DIAGNOSIS — N939 Abnormal uterine and vaginal bleeding, unspecified: Secondary | ICD-10-CM

## 2023-06-20 MED ORDER — NICOTINE 14 MG/24HR TD PT24
MEDICATED_PATCH | TRANSDERMAL | 0 refills | Status: DC
  Filled 2023-06-20: qty 14, 14d supply, fill #0
  Filled 2023-06-20 (×2): qty 14, fill #0

## 2023-06-20 MED ORDER — MEGESTROL ACETATE 20 MG PO TABS
20.0000 mg | ORAL_TABLET | Freq: Every day | ORAL | 11 refills | Status: DC
Start: 2023-06-20 — End: 2023-09-23
  Filled 2023-06-20 (×3): qty 30, 30d supply, fill #0
  Filled 2023-07-19: qty 30, 30d supply, fill #1

## 2023-06-20 MED ORDER — NICOTINE 7 MG/24HR TD PT24
MEDICATED_PATCH | TRANSDERMAL | 0 refills | Status: DC
  Filled 2023-06-20 (×2): qty 14, 14d supply, fill #0
  Filled 2023-06-20: qty 14, fill #0

## 2023-06-20 NOTE — Telephone Encounter (Signed)
 Receive call from patient to set up HST. No one available to take call, transferred to [facility] VM

## 2023-06-20 NOTE — Progress Notes (Signed)
 53 y.o. [id number] female with prior CDx1+BTL, AUB on megace , uterine fibroids here for preoperative exam. Significant Other x 5 years.  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 Her her past medical history is complicated by hypertension, GERD, tobacco use, COPD, history of substance abuse, bipolar disorder, anxiety (now seeing Dr. Ronny Colas).  She was referred from PCP for uterine fibroids, heavy periods, passing clots.   Patient's last menstrual period was 05/24/2023 (approximate).  At 04/15/23 appt, she reported: longstanding history of heavy periods.  However her cycles have become more irregular.  She only had 2 cycles in 2024- April and December. Her last cycle lasted almost 2 months.  She also experienced severe cramping, back pain, lightheadedness. She stopped the Megace  2 weeks ago and never started Lysteda .  She did not feel that the Megace  was helping.  At 04/23/2023 appointment, she reported has been clean from substance abuse for over 30 years.  Has not drink alcohol in 3 years however she had 1 drink last night in preparation for her appointment today (EMB).  She is tearful about this during her appointment. Her significant other is supportive of her desires to remain clean.  He is also willing to quit smoking cigarettes while she is also preparing for surgery.  She is motivated to stop drinking and stop smoking.  She is currently smoking half a pack per day."  At 05/20/2023 appointment, she reports spotting started again yesterday on megace . Using pantyliner as needed. Desires hysterectomy per 2/11 phone note.  Reviewed with patient today.  Nicoderm patches cause her to have nightmares. She wore it for one week but stopped taking it due to nightmares. She has reduced the amount of cigarettes to 3 per day from 10/day. Discuss surgery to remove fibroids.  EtOH use: none since last appt Appt/ref- issue with scheduling due to insurance  No CP and SOB. Nonproductive, chronic cough for ~1 wk. Neg COVID/Flu testing  03/26/23.  Today, she reports that she just restarted taking nicotine  patches yesterday and has been struggling to quit smoking. Patient is still smoking 6 cigarettes/day.    She has been able to get in with psychiatry.  Work-up to date: 03/11/23 FSH 7, TSH wnl 04/10/23 Hgb 11.7, Plt 429k  03/26/23 TVUS:   Uterus  Measurements: 10.4 x 9.3 x 6.1 cm = volume: 307.5 mL. Lobular myometrium with mass lesions consistent with fibroids. Example left fundal measures 5.5 x 5.3 x 7.6 cm. Central left 4.6 x 3.5 x 4.4 cm and right 2.7 x 2.7 x 2.5 cm. These are appear larger than the prior CT scan of 2023.   Endometrium  Thickness: 13 mm.  Borderline thickened   Right and left ovary  Measurements: Obscured by bowel gas and soft tissue. Pulsed Doppler evaluation was not performed as the ovaries themselves are not well seen   Other findings  No abnormal free fluid.   IMPRESSION: Multiple uterine fibroids. The largest measures up to 7.6 cm. These are slightly larger than the prior CT scan. Please correlate with clinical history and if needed additional workup with MRI to further delineate enhancement and signal characteristics.   Borderline thickened endometrium at 13 mm.   Poor visualization of the ovaries.     Component Value Date/Time   DIAGPAP  04/15/2023 1523    - Negative for Intraepithelial Lesions or Malignancy (NILM)   DIAGPAP - Benign reactive/reparative changes 04/15/2023 1523   DIAGPAP  06/10/2019 1042    - Negative for intraepithelial lesion or malignancy (NILM)  HPVHIGH Negative 04/15/2023 1523   HPVHIGH Negative 06/10/2019 1042   ADEQPAP  04/15/2023 1523    Satisfactory for evaluation; transformation zone component PRESENT.   ADEQPAP  06/10/2019 1042    Satisfactory for evaluation; transformation zone component PRESENT.   ADEQPAP  12/31/2016 0000    Satisfactory for evaluation  endocervical/transformation zone component PRESENT.  04/23/23: EMB benign  Birth control:  BTL Sexually active: yes    GYN HISTORY: No significant history  OB History  Gravida Para Term Preterm AB Living  8 4   4 4   SAB IAB Ectopic Multiple Live Births  2 2       # Outcome Date GA Lbr Len/2nd Weight Sex Type Anes PTL Lv  8 Para           7 Para           6 Para           5 Para           4 SAB           3 SAB           2 IAB           1 IAB             Past Medical History:  Diagnosis Date   Alcoholism (HCC)    Allergy    seasonal   Anxiety    Arthritis    Asthma    Bed wetting    Blood transfusion    1989 at [place]   Bronchitis    Chronic bipolar disorder (HCC)    Chronic kidney disease    Depression    bipolar   GERD (gastroesophageal reflux disease)    Headache(784.0)    occasional   Hypertension    Mental disorder    bipolar   MRSA infection 2011   left lower leg   Pancreatitis    Pyelonephritis 10/2010   Recurrent upper respiratory infection (URI)    states she has not been to MD and feels like she has  bronchitis now- greenish sputum   Shortness of breath    sometimes with exertion   Sickle cell trait (HCC)    Sleep apnea    CPAP   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   UTI (lower urinary tract infection) 10/2010    Past Surgical History:  Procedure Laterality Date   CESAREAN SECTION     KNEE ARTHROSCOPY  06/15/2011   Procedure: ARTHROSCOPY KNEE;  Surgeon: Arlette Lagos, MD;  Location: [facility] OR;  Service: Orthopedics;  Laterality: Left;  LEFT KNEE SCOPE WITH LYSIS OF ADHESIONS AND MANIPULATION   KNEE ARTHROSCOPY WITH MEDIAL MENISECTOMY Left 03/27/2018   Procedure: LEFT KNEE ARTHROSCOPY WITH PARTIAL MEDIAL MENISCECTOMY;  Surgeon: Arnie Lao, MD;  Location: [facility];  Service: Orthopedics;  Laterality: Left;   ORIF TIBIA PLATEAU  03/08/2011   Procedure: OPEN REDUCTION INTERNAL FIXATION (ORIF) TIBIAL PLATEAU;  Surgeon: Arlette Lagos;  Location: [facility] OR;  Service: Orthopedics;  Laterality: Left;    TUBAL LIGATION      Current Outpatient Medications on File Prior to Visit  Medication Sig Dispense Refill   atorvastatin  (LIPITOR) 20 MG tablet Take 1 tablet (20 mg total) by mouth daily. 90 tablet 1   fluticasone  (FLONASE ) 50 MCG/ACT nasal spray Place 2 sprays into both nostrils daily. 16 g 6   Incontinence Supplies MISC 2 each by  Does not apply route at bedtime. 60 each 6   Iron , Ferrous Sulfate , 325 (65 Fe) MG TABS Take 1 tablet (325 mg) by mouth daily. 60 tablet 1   levocetirizine (XYZAL ) 5 MG tablet Take 1 tablet (5 mg total) by mouth every evening. 90 tablet 1   triamcinolone  cream (KENALOG ) 0.1 % Apply 1 Application topically 2 (two) times daily. 80 g 1   valsartan -hydrochlorothiazide  (DIOVAN -HCT) 80-12.5 MG tablet Take 0.5 tablets by mouth daily. 45 tablet 0   No current facility-administered medications on file prior to visit.    Allergies  Allergen Reactions   Aspirin  Other (See Comments)    "chilhood allergy"   Banana Hives   Latex Hives   Penicillins Swelling    Has patient had a PCN reaction causing immediate rash, facial/tongue/throat swelling, SOB or lightheadedness with hypotension: unknown Has patient had a PCN reaction causing severe rash involving mucus membranes or skin necrosis: unknown Has patient had a PCN reaction that required hospitalization unknown Has patient had a PCN reaction occurring within the last 10 years: no If all of the above answers are "NO", then may proceed with Cephalosporin use.    Septra [Bactrim] Itching and Swelling   Shellfish Allergy Other (See Comments)    No "reaction" tested positive   Strawberry Extract Hives   Tape Other (See Comments)    Skin peels away if paper tape is left on for long period of time      PE Today's Vitals   06/20/23 0904  BP: 118/72  Pulse: 85  Temp: 98.2 F (36.8 C)  TempSrc: Oral  SpO2: 99%  Weight: 212 lb (96.2 kg)   Body mass index is 32.23 kg/m.  Physical Exam Vitals reviewed.   Constitutional:      General: She is not in acute distress.    Appearance: Normal appearance.  HENT:     Head: Normocephalic and atraumatic.     Nose: Nose normal.  Eyes:     Extraocular Movements: Extraocular movements intact.     Conjunctiva/sclera: Conjunctivae normal.  Pulmonary:     Effort: Pulmonary effort is normal.  Musculoskeletal:        General: Normal range of motion.     Cervical back: Normal range of motion.  Neurological:     General: No focal deficit present.     Mental Status: She is alert.  Psychiatric:        Mood and Affect: Mood normal.        Behavior: Behavior normal.     Assessment and Plan:        Abnormal uterine bleeding (AUB) Assessment & Plan: Plan for robotic assisted total laparoscopic hysterectomy and bilateral salpingectomy. Tubal and hysterectomy form signed 05/20/23. Medical clearance provided by primary.  Moderate risk for surgery (06/19/23).  However, patient is struggling with tobacco cessation. Patient is still smoking 6 cigarettes/day.  Stopped nicotine  patches previously.  Restarted yesterday.  Reviewed with patient the need for preoperative smoking cessation to prevent postoperative complications including infection and vaginal cuff dehiscence. Recommend completion of 8 weeks of nicotine  taper and smoking cessation.  Also reviewed the importance of smoking cessation during 12-week postoperative recovery.  Return office in 2 months for reevaluation.  Patient frustrated with delay in scheduling of surgery.  Patient to continue Megace  20 mg once daily during this timeframe. Continue daily iron . Psych referral placed for history of substance abuse with recent alcohol use- continue care with Dr. Ronny Colas.  Orders: -  Megestrol  Acetate; Take 1 tablet (20 mg total) by mouth daily.  Dispense: 30 tablet; Refill: 11  Intramural leiomyoma of uterus  Cigarette nicotine  dependence with nicotine -induced disorder -     Nicotine ; RX #1 Weeks  1-6: 14 mg x 1 patch daily. Wear for 24 hours. If you have sleep disturbances, remove at bedtime.  Dispense: 14 patch; Refill: 0 -     Nicotine ; RX #2 Weeks 7-8: 7 mg x 1 patch dailyWear for 24 hours. If you have sleep disturbances, remove at bedtime..  Dispense: 14 patch; Refill: 0  Bipolar I disorder, most recent episode depressed (HCC)   As noted above  38 min  total time was spent for this patient encounter, including preparation, face-to-face counseling with the patient, coordination of care, and documentation of the encounter.  Romaine Closs, MD

## 2023-06-20 NOTE — Telephone Encounter (Signed)
 Copied from CRM [id number]. Topic: Clinical - Medication Question >> Jun 20, 2023  9:08 AM Allyne Areola wrote: Reason for CRM: Patient is calling to see if we can send the order for Incontinence Supplies and notes from yesterday to [employer], Fax number is [phone]. Please call patient when the order has been faxed so she can follow up with the company.

## 2023-06-24 ENCOUNTER — Encounter: Payer: Self-pay | Admitting: Neurology

## 2023-06-24 ENCOUNTER — Other Ambulatory Visit (HOSPITAL_COMMUNITY): Payer: Self-pay

## 2023-06-26 ENCOUNTER — Ambulatory Visit: Payer: MEDICAID | Admitting: Nurse Practitioner

## 2023-06-26 ENCOUNTER — Encounter (HOSPITAL_COMMUNITY): Payer: Self-pay | Admitting: Physician Assistant

## 2023-06-26 ENCOUNTER — Telehealth (HOSPITAL_COMMUNITY): Payer: MEDICAID | Admitting: Physician Assistant

## 2023-06-26 DIAGNOSIS — F411 Generalized anxiety disorder: Secondary | ICD-10-CM | POA: Diagnosis not present

## 2023-06-26 DIAGNOSIS — F99 Mental disorder, not otherwise specified: Secondary | ICD-10-CM

## 2023-06-26 DIAGNOSIS — F4310 Post-traumatic stress disorder, unspecified: Secondary | ICD-10-CM

## 2023-06-26 DIAGNOSIS — F331 Major depressive disorder, recurrent, moderate: Secondary | ICD-10-CM | POA: Diagnosis not present

## 2023-06-26 DIAGNOSIS — F5105 Insomnia due to other mental disorder: Secondary | ICD-10-CM

## 2023-06-26 MED ORDER — PROPRANOLOL HCL 10 MG PO TABS
10.0000 mg | ORAL_TABLET | Freq: Two times a day (BID) | ORAL | 1 refills | Status: DC | PRN
  Filled 2023-06-26 – 2023-06-27 (×2): qty 60, 30d supply, fill #0

## 2023-06-26 MED ORDER — VENLAFAXINE HCL ER 37.5 MG PO CP24
37.5000 mg | ORAL_CAPSULE | Freq: Every day | ORAL | 1 refills | Status: DC
Start: 2023-06-26 — End: 2023-08-07
  Filled 2023-06-26 – 2023-06-27 (×2): qty 30, 30d supply, fill #0

## 2023-06-26 MED ORDER — TRAZODONE HCL 100 MG PO TABS
100.0000 mg | ORAL_TABLET | Freq: Every day | ORAL | 1 refills | Status: AC
  Filled 2023-06-26 – 2023-06-27 (×2): qty 30, 30d supply, fill #0
  Filled 2023-07-19: qty 30, 30d supply, fill #1
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
## 2023-06-27 ENCOUNTER — Other Ambulatory Visit: Payer: Self-pay

## 2023-06-27 ENCOUNTER — Other Ambulatory Visit (HOSPITAL_COMMUNITY): Payer: Self-pay

## 2023-06-27 ENCOUNTER — Other Ambulatory Visit (HOSPITAL_COMMUNITY): Payer: Self-pay | Admitting: Physician Assistant

## 2023-06-27 ENCOUNTER — Telehealth (HOSPITAL_COMMUNITY): Payer: Self-pay | Admitting: Physician Assistant

## 2023-06-27 ENCOUNTER — Encounter: Payer: Self-pay | Admitting: Nurse Practitioner

## 2023-06-27 DIAGNOSIS — F331 Major depressive disorder, recurrent, moderate: Secondary | ICD-10-CM

## 2023-06-27 NOTE — Telephone Encounter (Signed)
 Order signed and faxed with office note to below number provided

## 2023-06-27 NOTE — Telephone Encounter (Signed)
 SCREENED BY NURSING STAFF    Patient states she was prescribes this medicine in this past for hot flashes. However, previous Doctor told her to stop the medicine because it was ineffective ( effor XR). She want to find another medication that will work best for her if possible.    JNL

## 2023-06-28 ENCOUNTER — Telehealth: Payer: Self-pay | Admitting: Nurse Practitioner

## 2023-06-28 ENCOUNTER — Other Ambulatory Visit: Payer: Self-pay

## 2023-06-28 NOTE — Telephone Encounter (Signed)
 Order refax on 06/28/23

## 2023-06-28 NOTE — Telephone Encounter (Signed)
 Copied from CRM [id number]. Topic: Clinical - Prescription Issue >> Jun 28, 2023  3:48 PM Jenice Mitts wrote: Reason for CRM: [facility] stated they need a prescription faxed over [employer] the only thing they are receiving are clinic notes

## 2023-06-28 NOTE — Telephone Encounter (Signed)
 Copied from CRM [id number]. Topic: Clinical - Prescription Issue >> Jun 28, 2023  9:31 AM Orien Bird wrote: Reason for CRM: Patient stated that [facility] stated they need a prescription faxed over [employer] [facility], not really sure what she meant but she stated they needed a signed order.

## 2023-06-28 NOTE — Telephone Encounter (Unsigned)
 Copied from CRM [id number]. Topic: Clinical - Medication Refill >> Jun 28, 2023  9:34 AM Orien Bird wrote: Most Recent Primary Care Visit:  Provider: Zorita Hiss  Department: [facility]  Visit Type: OFFICE VISIT  Date: 06/19/2023  Medication: Pantoprazole  40 mg   Has the patient contacted their pharmacy? No (Agent: If no, request that the patient contact the pharmacy for the refill. If patient does not wish to contact the pharmacy document the reason why and proceed with request.) (Agent: If yes, when and what did the pharmacy advise?)  Is this the correct pharmacy for this prescription? Yes If no, delete pharmacy and type the correct one.  This is the patient's preferred pharmacy:  [facility] [address], [address] [place] [address] Phone: [phone] Fax: [phone]  Has the prescription been filled recently? No  Is the patient out of the medication? Yes  Has the patient been seen for an appointment in the last year OR does the patient have an upcoming appointment? Yes  Can we respond through MyChart? Yes  Agent: Please be advised that Rx refills may take up to 3 business days. We ask that you follow-up with your pharmacy.

## 2023-07-01 DIAGNOSIS — D251 Intramural leiomyoma of uterus: Secondary | ICD-10-CM | POA: Insufficient documentation

## 2023-07-01 NOTE — Assessment & Plan Note (Signed)
 Plan for robotic assisted total laparoscopic hysterectomy and bilateral salpingectomy. Tubal and hysterectomy form signed 05/20/23. Medical clearance provided by primary.  Moderate risk for surgery (06/19/23).  However, patient is struggling with tobacco cessation. Patient is still smoking 6 cigarettes/day.  Stopped nicotine  patches previously.  Restarted yesterday.  Reviewed with patient the need for preoperative smoking cessation to prevent postoperative complications including infection and vaginal cuff dehiscence. Recommend completion of 8 weeks of nicotine  taper and smoking cessation.  Also reviewed the importance of smoking cessation during 12-week postoperative recovery.  Return office in 2 months for reevaluation.  Patient frustrated with delay in scheduling of surgery.  Patient to continue Megace  20 mg once daily during this timeframe. Continue daily iron . Psych referral placed for history of substance abuse with recent alcohol use- continue care with Dr. Ronny Colas.

## 2023-07-02 ENCOUNTER — Other Ambulatory Visit: Payer: Self-pay

## 2023-07-02 ENCOUNTER — Other Ambulatory Visit (HOSPITAL_COMMUNITY): Payer: Self-pay

## 2023-07-02 ENCOUNTER — Other Ambulatory Visit: Payer: Self-pay | Admitting: Nurse Practitioner

## 2023-07-02 ENCOUNTER — Ambulatory Visit: Admitting: Neurology

## 2023-07-02 DIAGNOSIS — K219 Gastro-esophageal reflux disease without esophagitis: Secondary | ICD-10-CM

## 2023-07-02 DIAGNOSIS — F331 Major depressive disorder, recurrent, moderate: Secondary | ICD-10-CM

## 2023-07-02 DIAGNOSIS — G4733 Obstructive sleep apnea (adult) (pediatric): Secondary | ICD-10-CM

## 2023-07-02 DIAGNOSIS — F5105 Insomnia due to other mental disorder: Secondary | ICD-10-CM

## 2023-07-02 DIAGNOSIS — N939 Abnormal uterine and vaginal bleeding, unspecified: Secondary | ICD-10-CM

## 2023-07-02 DIAGNOSIS — D500 Iron deficiency anemia secondary to blood loss (chronic): Secondary | ICD-10-CM

## 2023-07-02 MED ORDER — PANTOPRAZOLE SODIUM 40 MG PO TBEC
40.0000 mg | DELAYED_RELEASE_TABLET | Freq: Every day | ORAL | 3 refills | Status: DC
  Filled 2023-07-02 (×2): qty 30, 30d supply, fill #0
  Filled 2023-07-19 – 2023-09-18 (×2): qty 30, 30d supply, fill #1
  Filled 2023-10-03 – 2023-11-06 (×3): qty 30, 30d supply, fill #2
  Filled 2023-12-12: qty 30, 30d supply, fill #3

## 2023-07-03 ENCOUNTER — Ambulatory Visit (HOSPITAL_COMMUNITY): Payer: MEDICAID | Admitting: Mental Health

## 2023-07-03 ENCOUNTER — Ambulatory Visit: Admitting: Physician Assistant

## 2023-07-03 NOTE — Progress Notes (Signed)
 [facility] [name] 53 year old female [birth year]   HOME SLEEP TEST REPORT ( by Watch PAT)   STUDY DATE:  4-29 through 4-30 -2025   ORDERING CLINICIAN: Neomia Banner, MD  REFERRING CLINICIAN:  Adella Agee, NP    CLINICAL INFORMATION/HISTORY:  asthma, anxiety , psychological insomnia,  depression, OSA . Had anemia due to dysmenorrheic bleeding.   [name] is a 53 y.o. female patient who is seen upon referral on 06/18/2023 from Adella Agee, NP  for a new sleep evaluation, she is  new patient to our practice. .  Chief concern according to patient : "I have all the symptoms of OSA, and I feel fatigued, wake coughing, snoring,  morning headaches, and sleepiness. I had a CPAP that I had to return because it wasn't calibrated right ". " I had insomnia in the past but now controlled PTSD on Seroquel  -but have tremors."   I have the pleasure of seeing [name] 06/18/[birth year] a right -handed AA  female with multiple sleep disorders . The patient had the first sleep study in the year 2019 with Dr Rosa College, MD,  with a result of an AHI ( Apnea Hypopnea index)  of 14.5/h ,  an oxygen saturation Nadir at SP02 86%.     Epworth sleepiness score:  14/ 24 points , FSS endorsed at 26/ 63 points. Depression score  : 07/15     BMI: 32.08 kg/m   Neck Circumference: 18.25 "   FINDINGS:   Sleep Summary:   Total Recording Time (hours, min):   7 hours 35 minutes     Total Sleep Time (hours, min):     6 hours 9 minutes            Percent REM (%):   26.4%                                     Respiratory Indices:   Calculated pAHI (per AASM or CMS guideline): 7.0/h when scored by AASM criteria 2.0/h when scored by CMS criteria.                        REM pAHI:   11.7/h by AASM criteria, 3.1/h scored by CMS criteria                                              NREM pAHI:     5.3/h by AASM criteria,     1.6/h by CMS criteria                     Positional AHI: The patient slept mostly on her right side with a AASM AHI of 2.7/h and supine sleep she reached around 24/h.  Snoring:      Mean volume was 45 dB and snoring was present for three quarters of the total recorded sleep time this is moderate to loud snoring.                                          Oxygen Saturation Statistics:   Oxygen  Saturation (%) Mean:   95%             O2 Saturation Range (%):    Between 83 and 100%                                   O2 Saturation (minutes) <89%:     0 minutes      Pulse Rate Statistics:   Pulse Mean (bpm):    86 bpm             Pulse Range:    Between 62 and 105 bpm             IMPRESSION:  This HST detected only very mild all obstructive sleep apnea and if we apply CMS criteria this patient would have no apnea at all.  There is no associated hypoxemia there is no bradycardia and tachycardia . This apnea is so mild that I would consider CPAP optional. This apnea was strongly dependent on sleep position and aside from weight loss to reduce snoring and apnea I would in addition strongly recommend to avoid supine sleep. Not sleeping on the back would treat this patient's apnea to a level where CPAP would not be needed.  RECOMMENDATION: CPAP is optional. Apnea is very, very mild and can be treated by avoiding sleeping on the back.  Weight loss is needed to reduce snoring and apnea.  Insomnia is not an organic sleep disorder and will usually not improve with treatment of apnea or snoring. Cognitive behavior therapy may be needed.     INTERPRETING PHYSICIAN:   Neomia Banner, MD  [facility] and [facility] Sleep Board certified by The [employer] of Sleep Medicine and Diplomate of the [employer] of [employer]. Board certified In Neurology through the ABPN, Fellow of the [employer] of [employer].

## 2023-07-04 NOTE — Telephone Encounter (Signed)
 Yes, forms was fax last week Friday and also yesterday 07/03/23

## 2023-07-05 ENCOUNTER — Other Ambulatory Visit (HOSPITAL_COMMUNITY): Payer: Self-pay | Admitting: Physician Assistant

## 2023-07-05 ENCOUNTER — Other Ambulatory Visit (HOSPITAL_COMMUNITY): Payer: Self-pay

## 2023-07-05 DIAGNOSIS — F331 Major depressive disorder, recurrent, moderate: Secondary | ICD-10-CM

## 2023-07-08 ENCOUNTER — Encounter: Payer: Self-pay | Admitting: Neurology

## 2023-07-08 NOTE — Procedures (Signed)
 [facility] [name] 53 year old female [birth year]   HOME SLEEP TEST REPORT ( by Watch PAT)   STUDY DATE:  4-29 through 4-30 -2025   ORDERING CLINICIAN: Neomia Banner, MD  REFERRING CLINICIAN:  Adella Agee, NP    CLINICAL INFORMATION/HISTORY:  asthma, anxiety , psychological insomnia,  depression, OSA . Had anemia due to dysmenorrheic bleeding.   [name] is a 53 y.o. female patient who is seen upon referral on 06/18/2023 from Adella Agee, NP  for a new sleep evaluation, she is  new patient to our practice. .  Chief concern according to patient : "I have all the symptoms of OSA, and I feel fatigued, wake coughing, snoring,  morning headaches, and sleepiness. I had a CPAP that I had to return because it wasn't calibrated right ". " I had insomnia in the past but now controlled PTSD on Seroquel  -but have tremors."   I have the pleasure of seeing [name] [birth year] a right -handed AA  female with multiple sleep disorders . The patient had the first sleep study in the year 2019 with Dr Rosa College, MD,  with a result of an AHI ( Apnea Hypopnea index)  of 14.5/h ,  an oxygen saturation Nadir at SP02 86%.     Epworth sleepiness score:  14/ 24 points , FSS endorsed at 26/ 63 points. Depression score  : 07/15     BMI: 32.08 kg/m   Neck Circumference: 18.25 "   FINDINGS:   Sleep Summary:   Total Recording Time (hours, min):   7 hours 35 minutes     Total Sleep Time (hours, min):     6 hours 9 minutes            Percent REM (%):   26.4%                                     Respiratory Indices:   Calculated pAHI (per AASM or CMS guideline): 7.0/h when scored by AASM criteria 2.0/h when scored by CMS criteria.                        REM pAHI:   11.7/h by AASM criteria, 3.1/h scored by CMS criteria                                              NREM pAHI:     5.3/h by AASM criteria,     1.6/h by CMS criteria                    Positional AHI:  The patient slept mostly on her right side with a AASM AHI of 2.7/h and supine sleep she reached around 24/h.  Snoring:      Mean volume was 45 dB and snoring was present for three quarters of the total recorded sleep time this is moderate to loud snoring.                                          Oxygen Saturation Statistics:   Oxygen Saturation (%) Mean:  95%             O2 Saturation Range (%):    Between 83 and 100%                                   O2 Saturation (minutes) <89%:     0 minutes      Pulse Rate Statistics:   Pulse Mean (bpm):    86 bpm             Pulse Range:    Between 62 and 105 bpm             IMPRESSION:  This HST detected only very mild all obstructive sleep apnea and if we apply CMS criteria this patient would have no apnea at all.  There is no associated hypoxemia there is no bradycardia and tachycardia . This apnea is so mild that I would consider CPAP optional. This apnea was strongly dependent on sleep position and aside from weight loss to reduce snoring and apnea I would in addition strongly recommend to avoid supine sleep. Not sleeping on the back would treat this patient's apnea to a level where CPAP would not be needed.  RECOMMENDATION: CPAP is optional. Apnea is very, very mild and can be treated by avoiding sleeping on the back.  Weight loss is needed to reduce snoring and apnea.  Insomnia is not an organic sleep disorder and will usually not improve with treatment of apnea or snoring. Cognitive behavior therapy may be needed.     INTERPRETING PHYSICIAN:   Neomia Banner, MD  [facility] and [facility] Sleep Board certified by The [employer] of Sleep Medicine and Diplomate of the [employer]. Board certified In Neurology through the ABPN, Fellow of the [employer].

## 2023-07-08 NOTE — Telephone Encounter (Signed)
 Called the patient and reviewed the sleep study results. Advised the sleep study overall was very mild sleep apnea. Advised based on the pt report she would not need to follow up in sleep clinic. Advised of avoiding sleeping supine and weight loss to help with the mild sleep apnea.

## 2023-07-10 ENCOUNTER — Encounter (HOSPITAL_COMMUNITY): Payer: Self-pay

## 2023-07-10 ENCOUNTER — Ambulatory Visit (HOSPITAL_COMMUNITY): Payer: MEDICAID | Admitting: Licensed Clinical Social Worker

## 2023-07-11 ENCOUNTER — Encounter: Payer: Self-pay | Admitting: Gastroenterology

## 2023-07-15 ENCOUNTER — Telehealth: Payer: Self-pay | Admitting: Occupational Therapy

## 2023-07-15 ENCOUNTER — Ambulatory Visit: Payer: MEDICAID | Attending: Orthopedic Surgery | Admitting: Occupational Therapy

## 2023-07-15 ENCOUNTER — Other Ambulatory Visit (HOSPITAL_COMMUNITY): Payer: Self-pay

## 2023-07-15 NOTE — Telephone Encounter (Signed)
 This is to document my attempt to call patient after no-show for OT appt this AM.  This is patient's 1st missed appt s/p evaluation 06/18/23.  She was out for a couple of weeks s/p surgery.   Which phone type was used to call the patient/alternate contact? Mobile [phone]  Was voice mail left reminding the patient to call the clinic back at [phone] to confirm appt? Yes - OTR left message with option for a later appt today at 10:15 AM or her next appt on Thursday at 8 AM.

## 2023-07-18 ENCOUNTER — Ambulatory Visit: Admitting: Allergy & Immunology

## 2023-07-18 ENCOUNTER — Telehealth: Payer: Self-pay | Admitting: Nurse Practitioner

## 2023-07-18 ENCOUNTER — Ambulatory Visit: Payer: MEDICAID | Admitting: Occupational Therapy

## 2023-07-18 DIAGNOSIS — R21 Rash and other nonspecific skin eruption: Secondary | ICD-10-CM

## 2023-07-18 NOTE — Telephone Encounter (Signed)
 Copied from CRM [id number]. Topic: Referral - Question >> Jul 18, 2023  8:43 AM Martin[place] E wrote: Reason for CRM: Patient called in stating that she called the facility of where her dermatology referral got sent to, and their soonest available appointment is not until December or January. Patient stated she cannot wait that long to get seen, so questioning if this referral can get sent to a different provider that can see her sooner. Callback number for patient is [phone].

## 2023-07-18 NOTE — Telephone Encounter (Signed)
 Left detail message for pt to call back about referral

## 2023-07-19 ENCOUNTER — Other Ambulatory Visit: Payer: Self-pay

## 2023-07-19 ENCOUNTER — Other Ambulatory Visit: Payer: Self-pay | Admitting: Internal Medicine

## 2023-07-19 ENCOUNTER — Other Ambulatory Visit (HOSPITAL_COMMUNITY): Payer: Self-pay

## 2023-07-19 DIAGNOSIS — I10 Essential (primary) hypertension: Secondary | ICD-10-CM

## 2023-07-19 NOTE — Addendum Note (Signed)
 Addended by: Arch Beans, Chelsea Cordia on: 07/19/2023 04:36 PM   Modules accepted: Orders

## 2023-07-19 NOTE — Telephone Encounter (Signed)
 Left detail message for pt to call back about referral

## 2023-07-22 ENCOUNTER — Other Ambulatory Visit (HOSPITAL_COMMUNITY): Payer: Self-pay

## 2023-07-22 ENCOUNTER — Telehealth: Payer: Self-pay | Admitting: Occupational Therapy

## 2023-07-22 ENCOUNTER — Ambulatory Visit: Payer: MEDICAID | Admitting: Occupational Therapy

## 2023-07-22 NOTE — Telephone Encounter (Signed)
 This is to document my attempt to call patient after no-show for OT appt this AM.  This is patient's # 3rd missed appt.   Primary phone number(s) was used in efforts to contact the patient.   Voice mail was left requesting the patient call the clinic back at [phone] to confirm upcoming appointment and reminding them of the clinic's attendance policy.    I have cancelled all future appts except the 2nd appt this week and informed pt that we would need to schedule week by week after that to ensure attendance.

## 2023-07-25 ENCOUNTER — Ambulatory Visit: Payer: MEDICAID | Admitting: Occupational Therapy

## 2023-07-26 ENCOUNTER — Encounter: Payer: Self-pay | Admitting: Nurse Practitioner

## 2023-07-26 ENCOUNTER — Telehealth: Payer: Self-pay | Admitting: Nurse Practitioner

## 2023-07-26 NOTE — Telephone Encounter (Signed)
 Order for urinary incontinence supplies signed and left on my desk. Please collect these and also provide office visit note from 06/19/23 which discusses her incontinence to the supplier as requested on paperwork.

## 2023-07-30 ENCOUNTER — Encounter: Payer: MEDICAID | Admitting: Occupational Therapy

## 2023-07-31 NOTE — Telephone Encounter (Signed)
 Forms faxed (result ok)

## 2023-08-01 ENCOUNTER — Encounter: Payer: MEDICAID | Admitting: Occupational Therapy

## 2023-08-05 ENCOUNTER — Encounter: Payer: MEDICAID | Admitting: Occupational Therapy

## 2023-08-07 ENCOUNTER — Other Ambulatory Visit (HOSPITAL_COMMUNITY): Payer: Self-pay

## 2023-08-07 ENCOUNTER — Encounter: Payer: Self-pay | Admitting: Gastroenterology

## 2023-08-07 ENCOUNTER — Other Ambulatory Visit: Payer: Self-pay

## 2023-08-07 ENCOUNTER — Ambulatory Visit (INDEPENDENT_AMBULATORY_CARE_PROVIDER_SITE_OTHER): Admitting: Gastroenterology

## 2023-08-07 ENCOUNTER — Other Ambulatory Visit (INDEPENDENT_AMBULATORY_CARE_PROVIDER_SITE_OTHER)

## 2023-08-07 VITALS — BP 138/82 | HR 73 | Ht 68.0 in | Wt 222.5 lb

## 2023-08-07 DIAGNOSIS — R195 Other fecal abnormalities: Secondary | ICD-10-CM

## 2023-08-07 DIAGNOSIS — R1319 Other dysphagia: Secondary | ICD-10-CM | POA: Diagnosis not present

## 2023-08-07 DIAGNOSIS — K760 Fatty (change of) liver, not elsewhere classified: Secondary | ICD-10-CM

## 2023-08-07 DIAGNOSIS — K861 Other chronic pancreatitis: Secondary | ICD-10-CM

## 2023-08-07 DIAGNOSIS — R1013 Epigastric pain: Secondary | ICD-10-CM

## 2023-08-07 DIAGNOSIS — R1310 Dysphagia, unspecified: Secondary | ICD-10-CM | POA: Diagnosis not present

## 2023-08-07 DIAGNOSIS — K219 Gastro-esophageal reflux disease without esophagitis: Secondary | ICD-10-CM | POA: Diagnosis not present

## 2023-08-07 DIAGNOSIS — G8929 Other chronic pain: Secondary | ICD-10-CM

## 2023-08-07 DIAGNOSIS — Z8719 Personal history of other diseases of the digestive system: Secondary | ICD-10-CM

## 2023-08-07 DIAGNOSIS — R933 Abnormal findings on diagnostic imaging of other parts of digestive tract: Secondary | ICD-10-CM

## 2023-08-07 LAB — COMPREHENSIVE METABOLIC PANEL WITH GFR
ALT: 9 U/L (ref 0–35)
AST: 17 U/L (ref 0–37)
Albumin: 4.1 g/dL (ref 3.5–5.2)
Alkaline Phosphatase: 76 U/L (ref 39–117)
BUN: 10 mg/dL (ref 6–23)
CO2: 24 meq/L (ref 19–32)
Calcium: 8.6 mg/dL (ref 8.4–10.5)
Chloride: 104 meq/L (ref 96–112)
Creatinine, Ser: 0.66 mg/dL (ref 0.40–1.20)
GFR: 100.17 mL/min (ref >60.00)
Glucose, Bld: 83 mg/dL (ref 70–99)
Potassium: 3.1 meq/L — ABNORMAL LOW (ref 3.5–5.1)
Sodium: 139 meq/L (ref 135–145)
Total Bilirubin: 0.4 mg/dL (ref 0.2–1.2)
Total Protein: 7.1 g/dL (ref 6.0–8.3)

## 2023-08-07 LAB — CBC WITH DIFFERENTIAL/PLATELET
Basophils Absolute: 0.1 10*3/uL (ref 0.0–0.1)
Basophils Relative: 1.4 % (ref 0.0–3.0)
Eosinophils Absolute: 0 10*3/uL (ref 0.0–0.7)
Eosinophils Relative: 0.5 % (ref 0.0–5.0)
HCT: 38.3 % (ref 36.0–46.0)
Hemoglobin: 12.5 g/dL (ref 12.0–15.0)
Lymphocytes Relative: 51.1 % — ABNORMAL HIGH (ref 12.0–46.0)
Lymphs Abs: 4.3 10*3/uL — ABNORMAL HIGH (ref 0.7–4.0)
MCHC: 32.5 g/dL (ref 30.0–36.0)
MCV: 85.3 fl (ref 78.0–100.0)
Monocytes Absolute: 0.4 10*3/uL (ref 0.1–1.0)
Monocytes Relative: 5.1 % (ref 3.0–12.0)
Neutro Abs: 3.6 10*3/uL (ref 1.4–7.7)
Neutrophils Relative %: 41.9 % — ABNORMAL LOW (ref 43.0–77.0)
Platelets: 351 10*3/uL (ref 150.0–400.0)
RBC: 4.49 Mil/uL (ref 3.87–5.11)
RDW: 15.4 % (ref 11.5–15.5)
WBC: 8.5 10*3/uL (ref 4.0–10.5)

## 2023-08-07 LAB — LIPASE: Lipase: 2 U/L — ABNORMAL LOW (ref 11.0–59.0)

## 2023-08-07 MED ORDER — ESOMEPRAZOLE MAGNESIUM 20 MG PO CPDR
20.0000 mg | DELAYED_RELEASE_CAPSULE | Freq: Every day | ORAL | 3 refills | Status: DC
  Filled 2023-08-07 (×2): qty 60, 60d supply, fill #0

## 2023-08-07 NOTE — Patient Instructions (Addendum)
 Stop pantoprazole .  We have sent the following medications to your pharmacy for you to pick up at your convenience: Esomeprazole , 20 mg twice a day.  Your provider has requested that you go to the basement level for lab work before leaving today. Press "B" on the elevator. The lab is located at the first door on the left as you exit the elevator.  Due to recent changes in healthcare laws, you may see the results of your imaging and laboratory studies on MyChart before your provider has had a chance to review them.  We understand that in some cases there may be results that are confusing or concerning to you. Not all laboratory results come back in the same time frame and the provider may be waiting for multiple results in order to interpret others.  Please give us  48 hours in order for your provider to thoroughly review all the results before contacting the office for clarification of your results.   You have been scheduled for a CT scan of the abdomen and pelvis at [facility], 1st floor Radiology. You are scheduled on 08/16/23 at 3:30 pm. You should arrive 2 hours prior to your appointment time for registration and to start drinking the oral contrast for the imaging.   You may take any medications as prescribed with a small amount of water, if necessary. If you take any of the following medications: METFORMIN, GLUCOPHAGE, GLUCOVANCE, AVANDAMET, RIOMET, FORTAMET, ACTOPLUS MET, JANUMET, GLUMETZA or METAGLIP, you MAY be asked to HOLD this medication 48 hours AFTER the exam.   The purpose of you drinking the oral contrast is to aid in the visualization of your intestinal tract. The contrast solution may cause some diarrhea. Depending on your individual set of symptoms, you may also receive an intravenous injection of x-ray contrast/dye. Plan on being at [facility] for 45 minutes or longer, depending on the type of exam you are having performed.   If you have any questions regarding your exam or  if you need to reschedule, you may call [facility] at [phone] between the hours of 8:00 am and 5:00 pm, Monday-Friday.    You have been scheduled for an endoscopy. Please follow written instructions given to you at your visit today.  If you use inhalers (even only as needed), please bring them with you on the day of your procedure.  If you take any of the following medications, they will need to be adjusted prior to your procedure:   DO NOT TAKE 7 DAYS PRIOR TO TEST- Trulicity (dulaglutide) Ozempic, Wegovy (semaglutide) Mounjaro (tirzepatide) Bydureon Bcise (exanatide extended release)  DO NOT TAKE 1 DAY PRIOR TO YOUR TEST Rybelsus (semaglutide) Adlyxin (lixisenatide) Victoza (liraglutide) Byetta (exanatide) ___________________________________________________________________________  Follow up in 2 months (see schedule).   Thank you for trusting me with your gastrointestinal care!   Valiant Gaul, PA-C   _______________________________________________________  If your blood pressure at your visit was 140/90 or greater, please contact your primary care physician to follow up on this.  _______________________________________________________  If you are age 53 or older, your body mass index should be between 23-30. Your Body mass index is 33.83 kg/m. If this is out of the aforementioned range listed, please consider follow up with your Primary Care Provider.  If you are age 53 or younger, your body mass index should be between 19-25. Your Body mass index is 33.83 kg/m. If this is out of the aformentioned range listed, please consider follow up with your Primary  Care Provider.   ________________________________________________________  The [facility] providers would like to encourage you to use MYCHART to communicate with providers for non-urgent requests or questions.  Due to long hold times on the telephone, sending your provider a message by [facility] may be a  faster and more efficient way to get a response.  Please allow 48 business hours for a response.  Please remember that this is for non-urgent requests.  _______________________________________________________

## 2023-08-07 NOTE — Progress Notes (Signed)
 [name] [id number] 01-08-1971   Chief Complaint: Dysphagia, epigastric pain, loose/oily stools  Referring Provider: Zorita Hiss, NP Primary GI MD: Para Bold (previous Dr. Savannah Curlin)  HPI: [name] is a 53 y.o. female with past medical history of CKD, GERD, HTN, sleep apnea on CPAP, substance abuse, acute alcoholic pancreatitis 2019, depression/chronic bipolar disorder who presents today for a complaint of dysphagia, chronic epigastric pain, and loose/oily stools.    Patient last seen in the office 03/25/2020 by Dr. Savannah Curlin for ER follow-up.  She had presented to the ED 02/10/2020 with acute on chronic upper epigastric abdominal pain with associated nausea that is worsened by eating.  Pain similar to intermittent episodes she has been having for years associated with multiple ED visits.  Had been found to have abnormal liver enzymes with total bilirubin 1.3, AST 81, ALT 52, alk phos 85, lipase 52.  No imaging was performed.   Plan at last office visit was to obtain pancreatic protocol CT, pancreatic elastase, repeat labs, trial amitriptyline  25 mg nightly, proceed with EUS if symptoms persist or no source identified on evaluation.  Patient stopped taking Elavil  because it made her feel weak and dizzy.  She did not keep her CT appointment, did have CT A/P as part of evaluation for suspected gastritis/pyelonephritis 10/2021 which showed some new coarse calcifications in the pancreatic head likely related to chronic pancreatitis with no acute inflammation and no ductal dilation.  Labs 05/22/2023: Normal CBC, CMP, lipid panel, TSH. Hemoglobin A1c 5.5.   Patient states she has had trouble swallowing for the last 3 years which has progressively worsened.  She is now having difficulty swallowing food, pills, and sometimes water.  She feels like food gets stuck in her throat and requires a lot of effort to get it to go down.  Sometimes she has to regurgitate food.  There are no  particular foods that are more difficult to swallow than others.  She also reports worsening acid reflux.  Having symptoms of acid reflux and heartburn daily, which can be present when she wakes up in the morning and is typically worse at night.  She endorses a lot of coughing at night, despite waiting 2 to 3 hours between dinner and bedtime, elevating the head of her bed, and taking PPI on an empty stomach in the morning and sometimes also again at night.  She has been taking Protonix  for a long time, on and off.  States that it is not helping at all with her reflux.  She has also tried OTC medications for reflux, Tums, Rolaids.  Patient also states she has been having intermittent episodes of frequent loose stools with color varying from black to dark green to yellow, and has noticed stools are oily.  Can notice oil floating on top of the toilet water.  Sometimes notes that stool smells "iron -y."  Last time she noticed black stools was a week or 2 ago.  Fecal pancreatic elastase was ordered at last visit with Dr. Savannah Curlin in 2022 but was never completed.  She has never taken Creon. Occasionally she may have some constipation.  She has had rectal bleeding in the past but has not noticed any blood in a couple years.  She does have known hemorrhoids.  Last colonoscopy 2021 with 7-year recall recommended.  She reports chronic epigastric pain that occurs a couple weeks out of the month every month.  Pain can feel like a gnawing discomfort in her epigastrium/central abdomen.  Sometimes can  radiate to her back.  She has had this pain intermittently for years.   Patient denies unintentional weight loss.  She has history of iron  deficiency for which she has previously taken iron  supplements.  Last time iron  was checked (April) it was normal and she was advised to stop her supplement.  States she had 5 months of menstrual bleeding, was found to have fibroids and is planning to have a hysterectomy hopefully next  month.  She has cut back on cigarette smoking from 15 cigarettes a day to 4.   Previous GI Procedures/Imaging   CT A/P 10/29/2021 1. No acute process in the abdomen or pelvis. 2. Fatty infiltration of the liver. 3. Uterine fibroids.  Colonoscopy 09/18/2019 - Hemorrhoids found on perianal exam.  - Non- bleeding internal hemorrhoids. The likely source of bleeding.  - Two 2 to 3 mm polyps in the rectum and in the transverse colon, removed with a cold snare. Resected and retrieved.  - One less than 1 mm polyp in the sigmoid colon, removed with a cold biopsy forceps. Resected and retrieved.  - The entire examined colon is normal. Biopsied for microscopic colitis.  - The examination was otherwise normal on direct and retroflexion views. - Recall 7 years Path: 1. Surgical [P], colon, rectum, sigmoid and transverse, polyp (3) - TUBULAR ADENOMA. - HYPERPLASTIC POLYP. - NO HIGH GRADE DYSPLASIA OR MALIGNANCY. 2. Surgical [P], colon, random sites - BENIGN COLONIC MUCOSA. - NO ACTIVE INFLAMMATION OR EVIDENCE OF MICROSCOPIC COLITIS. - NO DYSPLASIA OR MALIGNANCY.  MRI abdomen MRCP 09/11/2019 1. Hepatic steatosis. 2. Normal gallbladder and biliary tree. 3. No evidence acute or chronic pancreatitis. No variant ductal anatomy.  EGD 01/20/2018 - Normal esophagus except for a widely patent Schatzki' s ring.  - Gastritis. Biopsied.  - Small hiatal hernia.  - Erythematous duodenopathy. Biopsied.  - The examination was otherwise normal. Path: 1. Surgical [P], duodenal BX - BENIGN SMALL BOWEL MUCOSA. - NO ACTIVE INFLAMMATION OR VILLOUS ATROPHY IDENTIFIED. 2. Surgical [P], gastric BX random sites - CHRONIC INACTIVE GASTRITIS. - THERE IS NO EVIDENCE HELICOBACTER PYLORI, DYSPLASIA OR MALIGNANCY.  Past Medical History:  Diagnosis Date   Alcoholism (HCC)    Allergy    seasonal   Anxiety    Arthritis    Asthma    Bed wetting    Blood transfusion    1989 at [place]   Bronchitis     Chronic bipolar disorder (HCC)    Chronic kidney disease    Depression    bipolar   GERD (gastroesophageal reflux disease)    Headache(784.0)    occasional   Hypertension    Mental disorder    bipolar   MRSA infection 2011   left lower leg   Pancreatitis    Pyelonephritis 10/2010   Recurrent upper respiratory infection (URI)    states she has not been to MD and feels like she has  bronchitis now- greenish sputum   Shortness of breath    sometimes with exertion   Sickle cell trait (HCC)    Sleep apnea    CPAP   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   UTI (lower urinary tract infection) 10/2010    Past Surgical History:  Procedure Laterality Date   CESAREAN SECTION     KNEE ARTHROSCOPY  06/15/2011   Procedure: ARTHROSCOPY KNEE;  Surgeon: Arlette Lagos, MD;  Location: [facility] OR;  Service: Orthopedics;  Laterality: Left;  LEFT KNEE SCOPE WITH LYSIS  OF ADHESIONS AND MANIPULATION   KNEE ARTHROSCOPY WITH MEDIAL MENISECTOMY Left 03/27/2018   Procedure: LEFT KNEE ARTHROSCOPY WITH PARTIAL MEDIAL MENISCECTOMY;  Surgeon: Arnie Lao, MD;  Location: [facility];  Service: Orthopedics;  Laterality: Left;   ORIF TIBIA PLATEAU  03/08/2011   Procedure: OPEN REDUCTION INTERNAL FIXATION (ORIF) TIBIAL PLATEAU;  Surgeon: Arlette Lagos;  Location: [facility] OR;  Service: Orthopedics;  Laterality: Left;   TUBAL LIGATION      Current Outpatient Medications  Medication Sig Dispense Refill   atorvastatin  (LIPITOR) 20 MG tablet Take 1 tablet (20 mg total) by mouth daily. 90 tablet 1   fluticasone  (FLONASE ) 50 MCG/ACT nasal spray Place 2 sprays into both nostrils daily. 16 g 6   Incontinence Supplies MISC 2 each by Does not apply route at bedtime. 60 each 6   Iron , Ferrous Sulfate , 325 (65 Fe) MG TABS Take 1 tablet (325 mg) by mouth daily. 60 tablet 1   levocetirizine (XYZAL ) 5 MG tablet Take 1 tablet (5 mg total) by mouth every evening. 90 tablet 1   megestrol  (MEGACE ) 20 MG  tablet Take 1 tablet (20 mg total) by mouth daily. 30 tablet 11   nicotine  (NICODERM CQ  - DOSED IN MG/24 HOURS) 14 mg/24hr patch RX #1 Weeks 1-6: 14 mg x 1 patch daily. Wear for 24 hours. If you have sleep disturbances, remove at bedtime. 14 patch 0   nicotine  (NICODERM CQ  - DOSED IN MG/24 HR) 7 mg/24hr patch RX #2 Weeks 7-8: 7 mg x 1 patch dailyWear for 24 hours. If you have sleep disturbances, remove at bedtime.. 14 patch 0   pantoprazole  (PROTONIX ) 40 MG tablet Take 1 tablet (40 mg total) by mouth daily. 30 tablet 3   propranolol  (INDERAL ) 10 MG tablet Take 1 tablet (10 mg total) by mouth 2 (two) times daily as needed. 60 tablet 1   traZODone  (DESYREL ) 100 MG tablet Take 1 tablet (100 mg total) by mouth at bedtime. 30 tablet 1   triamcinolone  cream (KENALOG ) 0.1 % Apply 1 Application topically 2 (two) times daily. 80 g 1   valsartan -hydrochlorothiazide  (DIOVAN -HCT) 80-12.5 MG tablet Take 0.5 tablets by mouth daily. 45 tablet 0   venlafaxine  XR (EFFEXOR -XR) 37.5 MG 24 hr capsule Take 1 capsule (37.5 mg total) by mouth daily with breakfast. 30 capsule 1   No current facility-administered medications for this visit.    Allergies as of 08/07/2023 - Review Complete 06/26/2023  Allergen Reaction Noted   Aspirin  Other (See Comments) 01/19/2011   Banana Hives 03/08/2011   Latex Hives 03/08/2011   Penicillins Swelling 01/19/2011   Septra [bactrim] Itching and Swelling 01/19/2011   Shellfish allergy Other (See Comments) 03/08/2011   Strawberry extract Hives 03/08/2011   Tape Other (See Comments) 04/19/2013    Family History  Problem Relation Age of Onset   Thyroid disease Father    Colon cancer Father    Cystic fibrosis Sister    Heart disease Brother    Esophageal cancer Paternal Grandmother    Anesthesia problems Neg Hx    Breast cancer Neg Hx    Colon polyps Neg Hx    Rectal cancer Neg Hx    Stomach cancer Neg Hx     Social History   Tobacco Use   Smoking status: Every Day     Current packs/day: 0.50    Average packs/day: 0.5 packs/day for 24.0 years (12.0 ttl pk-yrs)    Types: Cigarettes   Smokeless tobacco: Never  Vaping  Use   Vaping status: Never Used  Substance Use Topics   Alcohol use: Not Currently    Comment: was drink 2 5th daily up unti 11/13/20   Drug use: Not Currently    Types: "Crack" cocaine    Comment: none since 2000     Review of Systems:    Constitutional: No weight loss, fever, chills, weakness or fatigue Ears, Nose, Throat:  No change in hearing or congestion Skin: No rash or itching Cardiovascular: No chest pain, chest pressure  Respiratory: No SOB or cough Gastrointestinal: See HPI and otherwise negative Genitourinary: No dysuria or change in urinary frequency Neurological: No headache, dizziness or syncope Musculoskeletal: No new muscle or joint pain Hematologic: No bruising    Physical Exam:  Vital signs: BP 138/82   Pulse 73   Ht 5\' 8"  (1.727 m)   Wt 222 lb 8 oz (100.9 kg)   SpO2 97%   BMI 33.83 kg/m    Constitutional: NAD, Well developed, Well nourished, alert and cooperative Head:  Normocephalic and atraumatic.  Eyes: No scleral icterus. Conjunctiva pink. Respiratory: Respirations even and unlabored. Lungs clear to auscultation bilaterally.  No wheezes, crackles, or rhonchi.  Cardiovascular:  Regular rate and rhythm. No murmurs. No peripheral edema. Gastrointestinal:  Soft, nondistended, diffusely tender to palpation with increased tenderness to palpation of RUQ and epigastrium. No rebound or guarding. Normal bowel sounds. No appreciable masses or hepatomegaly. Rectal:  Not performed.  Neurologic:  Alert and oriented x4;  grossly normal neurologically.  Skin:   Dry and intact without significant lesions or rashes. Psychiatric: Oriented to person, place and time. Demonstrates good judgement and reason without abnormal affect or behaviors.   RELEVANT LABS AND IMAGING: CBC    Component Value Date/Time   WBC 8.0  05/22/2023 1036   RBC 4.40 05/22/2023 1036   HGB 12.8 05/22/2023 1036   HGB 11.7 04/10/2023 0912   HCT 38.6 05/22/2023 1036   HCT 37.1 04/10/2023 0912   PLT 308.0 05/22/2023 1036   PLT 429 04/10/2023 0912   MCV 87.8 05/22/2023 1036   MCV 87 04/10/2023 0912   MCH 27.5 04/10/2023 0912   MCH 27.1 03/26/2023 1258   MCHC 33.1 05/22/2023 1036   RDW 17.6 (H) 05/22/2023 1036   RDW 17.0 (H) 04/10/2023 0912   LYMPHSABS 2.8 05/22/2023 1036   LYMPHSABS 2.5 04/10/2023 0912   MONOABS 0.6 05/22/2023 1036   EOSABS 0.1 05/22/2023 1036   EOSABS 0.1 04/10/2023 0912   BASOSABS 0.1 05/22/2023 1036   BASOSABS 0.0 04/10/2023 0912    CMP     Component Value Date/Time   NA 139 05/22/2023 1036   NA 138 04/04/2022 1520   K 3.5 05/22/2023 1036   CL 104 05/22/2023 1036   CO2 27 05/22/2023 1036   GLUCOSE 108 (H) 05/22/2023 1036   BUN 8 05/22/2023 1036   BUN 9 04/04/2022 1520   CREATININE 0.65 05/22/2023 1036   CREATININE 0.74 04/18/2020 1259   CALCIUM  9.8 05/22/2023 1036   PROT 8.0 05/22/2023 1036   PROT 7.5 04/04/2022 1520   ALBUMIN 4.5 05/22/2023 1036   ALBUMIN 4.5 04/04/2022 1520   AST 16 05/22/2023 1036   AST 71 (H) 04/18/2020 1259   ALT 11 05/22/2023 1036   ALT 46 (H) 04/18/2020 1259   ALKPHOS 104 05/22/2023 1036   BILITOT 0.4 05/22/2023 1036   BILITOT 0.3 04/04/2022 1520   BILITOT 1.3 (H) 04/18/2020 1259   GFRNONAA >60 03/26/2023 1258   GFRNONAA >60 04/18/2020  1259   GFRAA >60 07/09/2019 2010     Assessment/Plan:   Dysphagia  GERD Patient with longstanding history of GERD which has been unresponsive to PPI therapy and lifestyle modifications.  Having acid reflux and heartburn daily.  Also reports worsening dysphagia, now having a sensation of food getting stuck every time she eats, and occasionally having to regurgitate food.  Problems with pills as well, occasionally water.  She did have Schatzki's ring identified on EGD in 2019 which was widely patent at that time.  She has had  some dark/black stools intermittently, no anemia on recent labs.  - Schedule EGD w/ dilation. I thoroughly discussed the procedure with the patient to include nature of the procedure, alternatives, benefits, and risks (including but not limited to bleeding, infection, perforation, anesthesia/cardiac/pulmonary complications). Patient verbalized understanding and gave verbal consent to proceed with procedure.  - Stop pantoprazole  - Start Nexium  20mg  BID - Continue lifestyle modifications for GERD  Suspected chronic pancreatitis based on history of acute pancreatitis with recurrent epigastric pain, loose/oily stools, and finding of pancreatic head calcifications on CT 2023 Hepatic steatosis Patient with history of acute alcoholic pancreatitis in 2019, since then with chronic intermittent epigastric pain.  She is tender to palpation of the epigastrium and right upper quadrant today.  Has tried Elavil  25 mg in the past which was discontinued due to side effect of weakness and dizziness.  Last CT A/P in 2023 showed coarse calcifications in the pancreatic head likely due to chronic pancreatitis.  Patient did not complete fecal pancreatic elastase test when last ordered in 2022.  Has never taken Creon.  Having intermittent loose and oily stools.  Had a normal CMP in March. At gynecology visit in April, patient reported that she had not had alcohol in 3 years, however did have a drink before that appointment.  Has been motivated to stop drinking and smoking.  Recently cut back to 4 cigarettes a day from 15.  - Order CT A/P with pancreas protocol to follow up on hepatic steatosis and suspected chronic pancreatitis, epigastric pain.  - Repeat labs: CBC, CMP, lipase - Check fecal pancreatic elastase and will plan to start Creon if EPI identified - Consider trial of gabapentin  - Alcohol and smoking cessation - If pain persists without cause identified on EGD or CT, can consider EUS    Valiant Gaul,  PA-C [facility] 08/07/2023, 7:59 AM  Patient Care Team: Zorita Hiss, NP as PCP - General (Nurse Practitioner)

## 2023-08-08 ENCOUNTER — Other Ambulatory Visit: Payer: Self-pay

## 2023-08-08 ENCOUNTER — Encounter: Payer: MEDICAID | Admitting: Occupational Therapy

## 2023-08-08 ENCOUNTER — Ambulatory Visit: Payer: Self-pay | Admitting: Gastroenterology

## 2023-08-08 ENCOUNTER — Other Ambulatory Visit (HOSPITAL_COMMUNITY): Payer: Self-pay

## 2023-08-08 DIAGNOSIS — K861 Other chronic pancreatitis: Secondary | ICD-10-CM

## 2023-08-08 MED ORDER — POTASSIUM CHLORIDE CRYS ER 20 MEQ PO TBCR
20.0000 meq | EXTENDED_RELEASE_TABLET | Freq: Two times a day (BID) | ORAL | 0 refills | Status: DC
  Filled 2023-08-08 (×2): qty 4, 2d supply, fill #0

## 2023-08-09 ENCOUNTER — Encounter: Payer: Self-pay | Admitting: Gastroenterology

## 2023-08-09 ENCOUNTER — Ambulatory Visit (AMBULATORY_SURGERY_CENTER): Payer: MEDICAID | Admitting: Gastroenterology

## 2023-08-09 ENCOUNTER — Other Ambulatory Visit

## 2023-08-09 VITALS — BP 176/95 | HR 94 | Temp 98.3°F | Resp 16 | Ht 68.0 in | Wt 222.0 lb

## 2023-08-09 DIAGNOSIS — K319 Disease of stomach and duodenum, unspecified: Secondary | ICD-10-CM | POA: Diagnosis not present

## 2023-08-09 DIAGNOSIS — R1319 Other dysphagia: Secondary | ICD-10-CM

## 2023-08-09 DIAGNOSIS — K2950 Unspecified chronic gastritis without bleeding: Secondary | ICD-10-CM

## 2023-08-09 DIAGNOSIS — K219 Gastro-esophageal reflux disease without esophagitis: Secondary | ICD-10-CM

## 2023-08-09 DIAGNOSIS — K222 Esophageal obstruction: Secondary | ICD-10-CM | POA: Diagnosis not present

## 2023-08-09 DIAGNOSIS — K861 Other chronic pancreatitis: Secondary | ICD-10-CM

## 2023-08-09 DIAGNOSIS — G8929 Other chronic pain: Secondary | ICD-10-CM

## 2023-08-09 DIAGNOSIS — R195 Other fecal abnormalities: Secondary | ICD-10-CM

## 2023-08-09 DIAGNOSIS — K760 Fatty (change of) liver, not elsewhere classified: Secondary | ICD-10-CM

## 2023-08-09 DIAGNOSIS — K449 Diaphragmatic hernia without obstruction or gangrene: Secondary | ICD-10-CM

## 2023-08-09 DIAGNOSIS — K2289 Other specified disease of esophagus: Secondary | ICD-10-CM | POA: Diagnosis not present

## 2023-08-09 MED ORDER — SODIUM CHLORIDE 0.9 % IV SOLN: 500.0000 mL | Freq: Once | INTRAVENOUS | Status: DC

## 2023-08-09 MED ORDER — ESOMEPRAZOLE MAGNESIUM 20 MG PO CPDR
20.0000 mg | DELAYED_RELEASE_CAPSULE | Freq: Two times a day (BID) | ORAL | 6 refills | Status: AC
  Filled 2023-08-09 – 2023-10-03 (×3): qty 60, 30d supply, fill #0
  Filled 2023-11-06: qty 60, 30d supply, fill #1
  Filled 2023-12-12: qty 60, 30d supply, fill #2

## 2023-08-09 NOTE — Progress Notes (Signed)
 Attending Physician's Attestation   I have reviewed the chart.   I agree with the Advanced Practitioner's note, impression, and recommendations with any updates as below.    Corliss Parish, MD [facility] Advanced Endoscopy Office # [phone]

## 2023-08-09 NOTE — Progress Notes (Signed)
 Called to room to assist during endoscopic procedure.  Patient ID and intended procedure confirmed with present staff. Received instructions for my participation in the procedure from the performing physician.

## 2023-08-09 NOTE — Progress Notes (Signed)
 Report to PACU, RN, vss, BBS= Clear.
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
## 2023-08-09 NOTE — Patient Instructions (Addendum)
 -Handout on hiatal hernia provided. -await pathology results. -Continue present medications -Work note provided  YOU HAD AN ENDOSCOPIC PROCEDURE TODAY AT THE [facility]:   Refer to the procedure report that was given to you for any specific questions about what was found during the examination.  If the procedure report does not answer your questions, please call your gastroenterologist to clarify.  If you requested that your care partner not be given the details of your procedure findings, then the procedure report has been included in a sealed envelope for you to review at your convenience later.  YOU SHOULD EXPECT: Some feelings of bloating in the abdomen. Passage of more gas than usual.  Walking can help get rid of the air that was put into your GI tract during the procedure and reduce the bloating. If you had a lower endoscopy (such as a colonoscopy or flexible sigmoidoscopy) you may notice spotting of blood in your stool or on the toilet paper. If you underwent a bowel prep for your procedure, you may not have a normal bowel movement for a few days.  Please Note:  You might notice some irritation and congestion in your nose or some drainage.  This is from the oxygen used during your procedure.  There is no need for concern and it should clear up in a day or so.  SYMPTOMS TO REPORT IMMEDIATELY:  Following upper endoscopy (EGD)  Vomiting of blood or coffee ground material  New chest pain or pain under the shoulder blades  Painful or persistently difficult swallowing  New shortness of breath  Fever of 100F or higher  Black, tarry-looking stools  For urgent or emergent issues, a gastroenterologist can be reached at any hour by calling [phone]. Do not use MyChart messaging for urgent concerns.    DIET:  You will have a dilation diet today (see handout). Drink plenty of fluids but you should avoid alcoholic beverages for 24 hours.  ACTIVITY:  You should plan to take it  easy for the rest of today and you should NOT DRIVE or use heavy machinery until tomorrow (because of the sedation medicines used during the test).    FOLLOW UP: Our staff will call the number listed on your records the next business day following your procedure.  We will call around 7:15- 8:00 am to check on you and address any questions or concerns that you may have regarding the information given to you following your procedure. If we do not reach you, we will leave a message.     If any biopsies were taken you will be contacted by phone or by letter within the next 1-3 weeks.  Please call us  at [phone] if you have not heard about the biopsies in 3 weeks.    SIGNATURES/CONFIDENTIALITY: You and/or your care partner have signed paperwork which will be entered into your electronic medical record.  These signatures attest to the fact that that the information above on your After Visit Summary has been reviewed and is understood.  Full responsibility of the confidentiality of this discharge information lies with you and/or your care-partner.   Hiatal Hernia  A hiatal hernia occurs when part of the stomach slides above the muscle that separates the abdomen from the chest (diaphragm). A person can be born with a hiatal hernia (congenital), or it may develop over time. In almost all cases of hiatal hernia, only the top part of the stomach pushes through the diaphragm. Many people have a  hiatal hernia with no symptoms. The larger the hernia, the more likely it is that you will have symptoms. In some cases, a hiatal hernia allows stomach acid to flow back into the tube that carries food from your mouth to your stomach (esophagus). This may cause heartburn symptoms. The development of heartburn symptoms may mean that you have a condition called gastroesophageal reflux disease (GERD). What are the causes? This condition is caused by a weakness in the opening (hiatus) where the esophagus passes through  the diaphragm to attach to the upper part of the stomach. A person may be born with a weakness in the hiatus, or a weakness can develop over time. What increases the risk? This condition is more likely to develop in: Older people. Age is a major risk factor for a hiatal hernia, especially if you are over the age of 61. Pregnant women. People who are overweight. People who have frequent constipation. What are the signs or symptoms? Symptoms of this condition usually develop in the form of GERD symptoms. Symptoms include: Heartburn. Upset stomach (indigestion). Trouble swallowing. Coughing or wheezing. Wheezing is making high-pitched whistling sounds when you breathe. Sore throat. Chest pain. Nausea and vomiting. How is this diagnosed? This condition may be diagnosed during testing for GERD. Tests that may be done include: X-rays of your stomach or chest. An upper gastrointestinal (GI) series. This is an X-ray exam of your GI tract that is taken after you swallow a chalky liquid that shows up clearly on the X-ray. Endoscopy. This is a procedure to look into your stomach using a thin, flexible tube that has a tiny camera and light on the end of it. How is this treated? This condition may be treated by: Dietary and lifestyle changes to help reduce GERD symptoms. Medicines. These may include: Over-the-counter antacids. Medicines that make your stomach empty more quickly. Medicines that block the production of stomach acid (H2 blockers). Stronger medicines to reduce stomach acid (proton pump inhibitors). Surgery to repair the hernia, if other treatments are not helping. If you have no symptoms, you may not need treatment. Follow these instructions at home: Lifestyle and activity Do not use any products that contain nicotine  or tobacco. These products include cigarettes, chewing tobacco, and vaping devices, such as e-cigarettes. If you need help quitting, ask your health care provider. Try  to achieve and maintain a healthy body weight. Avoid putting pressure on your abdomen. Anything that puts pressure on your abdomen increases the amount of acid that may be pushed up into your esophagus. Avoid bending over, especially after eating. Raise the head of your bed by putting blocks under the legs. This keeps your head and esophagus higher than your stomach. Do not wear tight clothing around your chest or stomach. Try not to strain when having a bowel movement, when urinating, or when lifting heavy objects. Eating and drinking Avoid foods that can worsen GERD symptoms. These may include: Fatty foods, like fried foods. Citrus fruits, like oranges or lemon. Other foods and drinks that contain acid, like orange juice or tomatoes. Spicy food. Chocolate. Eat frequent small meals instead of three large meals a day. This helps prevent your stomach from getting too full. Eat slowly. Do not lie down right after eating. Do not eat 1-2 hours before bed. Do not drink beverages with caffeine. These include cola, coffee, cocoa, and tea. Do not drink alcohol. General instructions Take over-the-counter and prescription medicines only as told by your health care provider. Keep all follow-up  visits. Your health care provider will want to check that any new prescribed medicines are helping your symptoms. Contact a health care provider if: Your symptoms are not controlled with medicines or lifestyle changes. You are having trouble swallowing. You have coughing or wheezing that will not go away. Your pain is getting worse. Your pain spreads to your arms, neck, jaw, teeth, or back. You feel nauseous or you vomit. Get help right away if: You have shortness of breath. You vomit blood. You have bright red blood in your stools. You have black, tarry stools. These symptoms may be an emergency. Get help right away. Call 911. Do not wait to see if the symptoms will go away. Do not drive yourself to  the hospital. Summary A hiatal hernia occurs when part of the stomach slides above the muscle that separates the abdomen from the chest. A person may be born with a weakness in the hiatus, or a weakness can develop over time. Symptoms of a hiatal hernia may include heartburn, trouble swallowing, or sore throat. Management of a hiatal hernia includes eating frequent small meals instead of three large meals a day. Get help right away if you vomit blood, have bright red blood in your stools, or have black, tarry stools. This information is not intended to replace advice given to you by your health care provider. Make sure you discuss any questions you have with your health care provider. Document Revised: 04/18/2021 Document Reviewed: 04/18/2021 Elsevier Patient Education  2024 [employer].

## 2023-08-09 NOTE — Op Note (Signed)
 [facility] Patient Name: [name] Procedure Date: 08/09/2023 3:32 PM MRN: [id number] Endoscopist: Yong Henle , MD, [id number] Age: 53 Referring MD:  Date of Birth: 07-11-1970 Gender: Female Account #: [id number] Procedure:                Upper GI endoscopy Indications:              Epigastric abdominal pain, Dysphagia, Heartburn Medicines:                Monitored Anesthesia Care Procedure:                Pre-Anesthesia Assessment:                           - Prior to the procedure, a History and Physical                            was performed, and patient medications and                            allergies were reviewed. The patient's tolerance of                            previous anesthesia was also reviewed. The risks                            and benefits of the procedure and the sedation                            options and risks were discussed with the patient.                            All questions were answered, and informed consent                            was obtained. Prior Anticoagulants: The patient has                            taken no anticoagulant or antiplatelet agents. ASA                            Grade Assessment: II - A patient with mild systemic                            disease. After reviewing the risks and benefits,                            the patient was deemed in satisfactory condition to                            undergo the procedure.                           After obtaining informed consent, the endoscope was  passed under direct vision. Throughout the                            procedure, the patient's blood pressure, pulse, and                            oxygen saturations were monitored continuously. The                            Olympus Scope [id number] was introduced through the                            mouth, and advanced to the second part of duodenum.                             The upper GI endoscopy was accomplished without                            difficulty. The patient tolerated the procedure. Scope In: Scope Out: Findings:                 No gross lesions were noted in the entire                            esophagus. Biopsies were taken with a cold forceps                            for histology to rule out EOE/LOE. After the rest                            of the EGD was completed, a guidewire was placed                            and the scope was withdrawn. Dilation was performed                            with a Savary dilator with mild resistance at 18                            mm. The dilation site was examined following                            endoscope reinsertion and showed in the distal                            esophagus at the noted below Schatzki ring, a mild                            mucosal disruption and mild improvement in luminal                            narrowing.  A non-obstructing Schatzki ring was found at the                            gastroesophageal junction. Disruption were taken                            with a cold forceps for histology were taken                            circumferentially before the dilation noted above.                           A 4 cm hiatal hernia was present.                           Patchy mildly erythematous mucosa without bleeding                            was found in the entire examined stomach. Biopsies                            were taken with a cold forceps for histology and                            Helicobacter pylori testing.                           No gross lesions were noted in the duodenal bulb,                            in the first portion of the duodenum and in the                            second portion of the duodenum. Biopsies were taken                            with a cold forceps for histology. Complications:            No immediate  complications. Estimated Blood Loss:     Estimated blood loss was minimal. Impression:               - No gross lesions in the entire esophagus.                            Biopsied. Dilated to 18 mm.                           - Non-obstructing Schatzki ring. Disrupted.                           - 4 cm hiatal hernia.                           - Erythematous mucosa in the stomach. Biopsied.                           -  No gross lesions in the duodenal bulb, in the                            first portion of the duodenum and in the second                            portion of the duodenum. Biopsied. Recommendation:           - The patient will be observed post-procedure,                            until all discharge criteria are met.                           - Discharge patient to home.                           - Patient has a contact number available for                            emergencies. The signs and symptoms of potential                            delayed complications were discussed with the                            patient. Return to normal activities tomorrow.                            Written discharge instructions were provided to the                            patient.                           - Dilation diet as per protocol.                           - Increase Nexium  to 20 mg twice daily. Recommend                            patient take 30 minutes before breakfast and 30                            minutes before dinner. Take this for 2 months then                            decrease back to once daily (remembering to take                            before meal).                           - Observe patient's clinical course.                           -  Await pathology results.                           - If patient continues to have dysphagia symptoms,                            we will consider SLP with modified barium swallow                            as well as  esophageal manometry.                           - The findings and recommendations were discussed                            with the patient.                           - The findings and recommendations were discussed                            with the patient's family. Yong Henle, MD 08/09/2023 4:10:26 PM

## 2023-08-12 ENCOUNTER — Other Ambulatory Visit: Payer: Self-pay

## 2023-08-12 ENCOUNTER — Telehealth: Payer: Self-pay

## 2023-08-12 NOTE — Telephone Encounter (Signed)
 Left message

## 2023-08-14 ENCOUNTER — Telehealth: Payer: Self-pay | Admitting: Gastroenterology

## 2023-08-14 ENCOUNTER — Other Ambulatory Visit (HOSPITAL_COMMUNITY): Payer: Self-pay

## 2023-08-14 LAB — SURGICAL PATHOLOGY

## 2023-08-14 NOTE — Telephone Encounter (Signed)
 Patient called and stated that she received a message from the nurse stated that they are needing her to turn in her stool sample. Patient stated that she already turned her stool sample in On June the 6 th. Please advise.

## 2023-08-14 NOTE — Telephone Encounter (Signed)
 Noted I did confirm that sample was turned in as stated.

## 2023-08-15 ENCOUNTER — Telehealth: Payer: Self-pay | Admitting: Gastroenterology

## 2023-08-15 LAB — PANCREATIC ELASTASE, FECAL: Pancreatic Elastase-1, Stool: >800 ug/g (ref >200)

## 2023-08-15 NOTE — Telephone Encounter (Signed)
 Inbound call from patient stating she was advised by [facility] radiology that her CT scheduled for tomorrow would have to be rescheduled due to insurance not authorizing. Patient stated that they have Amb[employer] listed for patient. Patient tried to advised scheduler that she no longer has Ambetter. Stated she was not given a chance to provide new insurance information. Patient is requesting a call back to discuss further. Please advise, thank you.

## 2023-08-16 ENCOUNTER — Ambulatory Visit: Payer: Self-pay | Admitting: Gastroenterology

## 2023-08-16 ENCOUNTER — Ambulatory Visit (HOSPITAL_COMMUNITY): Payer: MEDICAID

## 2023-08-16 ENCOUNTER — Other Ambulatory Visit (HOSPITAL_COMMUNITY): Payer: Self-pay

## 2023-08-16 ENCOUNTER — Other Ambulatory Visit: Payer: Self-pay

## 2023-08-16 MED ORDER — VALSARTAN-HYDROCHLOROTHIAZIDE 80-12.5 MG PO TABS
1.0000 | ORAL_TABLET | Freq: Every day | ORAL | 0 refills | Status: AC
  Filled 2023-08-16 (×2): qty 90, 90d supply, fill #0

## 2023-08-16 MED ORDER — CLONIDINE HCL 0.1 MG PO TABS
0.1000 mg | ORAL_TABLET | Freq: Every day | ORAL | 0 refills | Status: DC
  Filled 2023-08-16 (×2): qty 30, 30d supply, fill #0

## 2023-08-16 MED ORDER — LITHIUM CARBONATE 300 MG PO CAPS
300.0000 mg | ORAL_CAPSULE | Freq: Every day | ORAL | 0 refills | Status: DC
  Filled 2023-08-16 (×2): qty 30, 30d supply, fill #0

## 2023-08-16 NOTE — Telephone Encounter (Signed)
 I sent message to April McPeak asking if she can verify why it was canceled.

## 2023-08-16 NOTE — Telephone Encounter (Signed)
 I spoke to [place] and she advised that she was told that her procedure had to be canceled because her insurance denied it.  I confirmed that we have [employer] on file.  She thought that the office visit covered the CT Scan also but I explained that is separate and sometimes the insurance companies have certain requirements to be me tbefore they approve the CT Scan.  She said the man that called didn't give her any information and canceled her procedure.

## 2023-08-17 ENCOUNTER — Other Ambulatory Visit (HOSPITAL_COMMUNITY): Payer: Self-pay

## 2023-08-19 ENCOUNTER — Ambulatory Visit: Admitting: Orthopedic Surgery

## 2023-08-21 NOTE — Telephone Encounter (Signed)
 I spoke to [place] and advised her that I never received a call back about why her procedure was canceled.  She was adamant that it was because they are trying to bill the wrong insurance.  She said that they had Amb[employer] and she has [employer].  I tried to explain that we are all in the same Epic system and we only have [employer].  I told her that she can try giving them a al to reschedule the appointment and have them use the [employer].

## 2023-08-22 ENCOUNTER — Ambulatory Visit: Payer: MEDICAID | Admitting: Allergy & Immunology

## 2023-08-26 ENCOUNTER — Ambulatory Visit: Admitting: Orthopaedic Surgery

## 2023-08-26 ENCOUNTER — Other Ambulatory Visit: Payer: Self-pay

## 2023-08-29 ENCOUNTER — Ambulatory Visit: Payer: MEDICAID | Admitting: Allergy & Immunology

## 2023-09-09 ENCOUNTER — Other Ambulatory Visit: Payer: Self-pay

## 2023-09-09 DIAGNOSIS — R110 Nausea: Secondary | ICD-10-CM

## 2023-09-09 NOTE — Telephone Encounter (Signed)
 Patient left a message on the triage line stating that her bleeding has started again. She states that she is in a lot of pain, but she is still smoking 4 cigarettes a day even with taking the nicotine  patches. She is wanting to know what else she can do for the bleeding and the pain. I placed a call back to the patient  to access her bleeding, but had to leave a message for call back at number on DRP.

## 2023-09-09 NOTE — Telephone Encounter (Signed)
 Patient states that she is soaking a depends diaper 7-8 times a day and her cramping pain level is at a 10. She is taking Megace  20 mg daily with no relief. She is feeling light headed and dizzy.   I spoke with Dr. Glennon about the patient bleeding and Cramping and she recommended her going to the ER. Patient has been advise to call 911 or have some one take her to the ER. Patient was advised to not drive.

## 2023-09-13 NOTE — Telephone Encounter (Signed)
 Spoke with patient, advised per Dr. Dallie. Confirmed patient has enough of current RX. Patient verbalizes understanding and is agreeable.

## 2023-09-17 ENCOUNTER — Other Ambulatory Visit (HOSPITAL_COMMUNITY): Payer: Self-pay

## 2023-09-17 ENCOUNTER — Other Ambulatory Visit: Payer: Self-pay

## 2023-09-17 MED ORDER — MEGESTROL ACETATE 40 MG PO TABS
40.0000 mg | ORAL_TABLET | Freq: Two times a day (BID) | ORAL | 0 refills | Status: DC
  Filled 2023-09-17: qty 20, 10d supply, fill #0

## 2023-09-17 MED ORDER — ONDANSETRON 4 MG PO TBDP
4.0000 mg | ORAL_TABLET | Freq: Three times a day (TID) | ORAL | 0 refills | Status: DC | PRN
  Filled 2023-09-17 (×2): qty 30, 10d supply, fill #0

## 2023-09-17 NOTE — Addendum Note (Signed)
 Addended by: BRUTUS KATE SAILOR on: 09/17/2023 01:46 PM   Modules accepted: Orders

## 2023-09-17 NOTE — Telephone Encounter (Signed)
 Call returned to patient. Patient increased Megace  to 40 mg daily on 09/13/23. States bleeding slowed on 7/14,so she did not call. Bleeding has increased again today. Had Changed 3 saturated pads and a diaper this morning. Menses cramps 10 out of 10 on pain scale, not relieved by OTC Tylenol  and Ibuprofen . Reports fatigue and nausea with dry heaves. Denies any other symptoms.   Currently scheduled for OV on 7/21  Advised I will provide update to Dr. Dallie and f/u with recommendations. Patient agreeable.   Dr. Dallie -please review.

## 2023-09-17 NOTE — Telephone Encounter (Signed)
 Hgb normal 08/07/22 Recommend that she increase megace  to 40g BID. Continue OTC meds for cramping. Zofran  sent for nausea. Ensure good oral fluids. She can be added on for 12p tomorrow if she would like to be seen prior to scheduled appt.  Provided HVB precautions.

## 2023-09-17 NOTE — Addendum Note (Signed)
 Addended by: DALLIE BOLLARD V on: 09/17/2023 12:37 PM   Modules accepted: Orders

## 2023-09-17 NOTE — Telephone Encounter (Signed)
 Spoke with patient, advised per Dr. Dallie.  Patient will keep OV as scheduled for 7/21, will call if any changes in symptoms. Bleeding precautions reviewed. Patient will need new Rx for Megace , pharmacy confirmed. Patient verbalizes understanding and is agreeable.   Dr. Dallie -Rx pended for Megace  40 mg BID

## 2023-09-18 ENCOUNTER — Other Ambulatory Visit: Payer: Self-pay

## 2023-09-18 ENCOUNTER — Other Ambulatory Visit (HOSPITAL_COMMUNITY): Payer: Self-pay

## 2023-09-23 ENCOUNTER — Other Ambulatory Visit: Payer: Self-pay

## 2023-09-23 ENCOUNTER — Other Ambulatory Visit (HOSPITAL_COMMUNITY): Payer: Self-pay

## 2023-09-23 ENCOUNTER — Ambulatory Visit (INDEPENDENT_AMBULATORY_CARE_PROVIDER_SITE_OTHER): Payer: MEDICAID | Admitting: Obstetrics and Gynecology

## 2023-09-23 VITALS — BP 140/80 | HR 75 | Temp 98.1°F | Wt 213.8 lb

## 2023-09-23 DIAGNOSIS — F411 Generalized anxiety disorder: Secondary | ICD-10-CM

## 2023-09-23 DIAGNOSIS — N939 Abnormal uterine and vaginal bleeding, unspecified: Secondary | ICD-10-CM | POA: Diagnosis not present

## 2023-09-23 MED ORDER — HYDROXYZINE PAMOATE 25 MG PO CAPS
25.0000 mg | ORAL_CAPSULE | Freq: Every day | ORAL | 11 refills | Status: AC | PRN
Start: 2023-09-23 — End: ?
  Filled 2023-09-23 (×2): qty 30, 30d supply, fill #0
  Filled 2023-10-03 – 2023-11-06 (×2): qty 30, 30d supply, fill #1
  Filled 2023-12-12: qty 30, 30d supply, fill #2

## 2023-09-23 NOTE — Patient Instructions (Signed)
 Preoperative instructions: Nothing to eat or drink after midnight, unless instructed differently regarding clear liquids by the anesthesia team at [facility] health. Do not take any medications on the day of surgery, except those listed below: Diovan , nexium  Please follow all other instructions as provided by our surgical scheduler at [facility] and the anesthesia team at [facility].  Postoperative instructions: Take ibuprofen  as prescribed and over-the-counter Tylenol  as needed.

## 2023-09-23 NOTE — Progress Notes (Signed)
 53 y.o. [id number] female with prior CDx1+BTL, AUB on megace , uterine fibroids here for surgical consult. Significant Other x 5 years.  Recent job change. Her her past medical history is complicated by hypertension, GERD, tobacco use, COPD, history of substance abuse, bipolar disorder, anxiety (now seeing Dr. Collene).  She was referred from PCP for uterine fibroids, heavy periods, passing clots.   No LMP recorded. Patient is perimenopausal.  At 04/15/23 appt, she reported: longstanding history of heavy periods.  However her cycles have become more irregular.  She only had 2 cycles in 2024- April and December. Her last cycle lasted almost 2 months.  She also experienced severe cramping, back pain, lightheadedness. She stopped the Megace  2 weeks ago and never started Lysteda .  She did not feel that the Megace  was helping.  At 04/23/2023 appointment, she reported has been clean from substance abuse for over 30 years.  Has not drink alcohol in 3 years however she had 1 drink last night in preparation for her appointment today (EMB).  She is tearful about this during her appointment. Her significant other is supportive of her desires to remain clean.  He is also willing to quit smoking cigarettes while she is also preparing for surgery.  She is motivated to stop drinking and stop smoking.  She is currently smoking half a pack per day.  At 05/20/2023 appointment, she reports spotting started again yesterday on megace . Using pantyliner as needed. Desires hysterectomy per 2/11 phone note.  Reviewed with patient today.  Nicoderm patches cause her to have nightmares. She wore it for one week but stopped taking it due to nightmares. She has reduced the amount of cigarettes to 3 per day from 10/day. Discuss surgery to remove fibroids.  EtOH use: none since last appt Appt/ref- issue with scheduling due to insurance  Today, she was seen for vaginal bleeding for about 2 months. Bleeding has decreased to spotting  with Megace  40 mg twice a day.  She wants to talk about having an ablation done as she recently started new job and will not have time to recover from hysterectomy. Patient is still smoking 4 cigarettes/day.   No CP and SOB.  She has been able to get in with psychiatry, however she has stopped the lithium  and clonidine  and is still reporting difficulty with anxiety and panic attacks.  She is still taking the trazodone .  Work-up to date: 03/11/23 FSH 7, TSH wnl 04/10/23 Hgb 11.7, Plt 429k  03/26/23 TVUS:   Uterus  Measurements: 10.4 x 9.3 x 6.1 cm = volume: 307.5 mL. Lobular myometrium with mass lesions consistent with fibroids. Example left fundal measures 5.5 x 5.3 x 7.6 cm. Central left 4.6 x 3.5 x 4.4 cm and right 2.7 x 2.7 x 2.5 cm. These are appear larger than the prior CT scan of 2023.   Endometrium  Thickness: 13 mm.  Borderline thickened   Right and left ovary  Measurements: Obscured by bowel gas and soft tissue. Pulsed Doppler evaluation was not performed as the ovaries themselves are not well seen   Other findings  No abnormal free fluid.   IMPRESSION: Multiple uterine fibroids. The largest measures up to 7.6 cm. These are slightly larger than the prior CT scan. Please correlate with clinical history and if needed additional workup with MRI to further delineate enhancement and signal characteristics.   Borderline thickened endometrium at 13 mm.   Poor visualization of the ovaries.     Component Value Date/Time   DIAGPAP  04/15/2023 1523    - Negative for Intraepithelial Lesions or Malignancy (NILM)   DIAGPAP - Benign reactive/reparative changes 04/15/2023 1523   DIAGPAP  06/10/2019 1042    - Negative for intraepithelial lesion or malignancy (NILM)   HPVHIGH Negative 04/15/2023 1523   HPVHIGH Negative 06/10/2019 1042   ADEQPAP  04/15/2023 1523    Satisfactory for evaluation; transformation zone component PRESENT.   ADEQPAP  06/10/2019 1042    Satisfactory for  evaluation; transformation zone component PRESENT.   ADEQPAP  12/31/2016 0000    Satisfactory for evaluation  endocervical/transformation zone component PRESENT.  04/23/23: EMB benign  Birth control: BTL Sexually active: yes    GYN HISTORY: No significant history  OB History  Gravida Para Term Preterm AB Living  8 4   4 4   SAB IAB Ectopic Multiple Live Births  2 2       # Outcome Date GA Lbr Len/2nd Weight Sex Type Anes PTL Lv  8 Para           7 Para           6 Para           5 Para           4 SAB           3 SAB           2 IAB           1 IAB             Past Medical History:  Diagnosis Date   Alcoholism (HCC)    Allergy    seasonal   Anxiety    Arthritis    Asthma    Bed wetting    Blood transfusion    1989 at [place]   Bronchitis    Chronic bipolar disorder (HCC)    Chronic kidney disease    Depression    bipolar   GERD (gastroesophageal reflux disease)    Headache(784.0)    occasional   Hypertension    Mental disorder    bipolar   MRSA infection 2011   left lower leg   Pancreatitis    Pyelonephritis 10/2010   Recurrent upper respiratory infection (URI)    states she has not been to MD and feels like she has  bronchitis now- greenish sputum   Shortness of breath    sometimes with exertion   Sickle cell trait (HCC)    Sickle cell trait (HCC)    Sleep apnea    CPAP   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   UTI (lower urinary tract infection) 10/2010    Past Surgical History:  Procedure Laterality Date   CESAREAN SECTION     COLONOSCOPY     KNEE ARTHROSCOPY  06/15/2011   Procedure: ARTHROSCOPY KNEE;  Surgeon: Ozell VEAR Bruch, MD;  Location: [facility] OR;  Service: Orthopedics;  Laterality: Left;  LEFT KNEE SCOPE WITH LYSIS OF ADHESIONS AND MANIPULATION   KNEE ARTHROSCOPY WITH MEDIAL MENISECTOMY Left 03/27/2018   Procedure: LEFT KNEE ARTHROSCOPY WITH PARTIAL MEDIAL MENISCECTOMY;  Surgeon: Vernetta Lonni GRADE, MD;  Location: [facility]  [place];  Service: Orthopedics;  Laterality: Left;   ORIF TIBIA PLATEAU  03/08/2011   Procedure: OPEN REDUCTION INTERNAL FIXATION (ORIF) TIBIAL PLATEAU;  Surgeon: Ozell VEAR Bruch;  Location: [facility] OR;  Service: Orthopedics;  Laterality: Left;   TUBAL LIGATION     UPPER GASTROINTESTINAL ENDOSCOPY  Current Outpatient Medications on File Prior to Visit  Medication Sig Dispense Refill   atorvastatin  (LIPITOR) 20 MG tablet Take 1 tablet (20 mg total) by mouth daily. 90 tablet 1   esomeprazole  (NEXIUM ) 20 MG capsule Take 1 capsule (20 mg total) by mouth 2 (two) times daily before a meal. 60 capsule 6   fluticasone  (FLONASE ) 50 MCG/ACT nasal spray Place 2 sprays into both nostrils daily. 16 g 6   levocetirizine (XYZAL ) 5 MG tablet Take 1 tablet (5 mg total) by mouth every evening. 90 tablet 1   megestrol  (MEGACE ) 40 MG tablet Take 1 tablet (40 mg total) by mouth 2 (two) times daily. 20 tablet 0   ondansetron  (ZOFRAN -ODT) 4 MG disintegrating tablet Take 1 tablet (4 mg total) by mouth every 8 (eight) hours as needed for nausea or vomiting. 30 tablet 0   pantoprazole  (PROTONIX ) 40 MG tablet Take 1 tablet (40 mg total) by mouth daily. 30 tablet 3   traZODone  (DESYREL ) 100 MG tablet Take 1 tablet (100 mg total) by mouth at bedtime. 30 tablet 1   triamcinolone  cream (KENALOG ) 0.1 % Apply 1 Application topically 2 (two) times daily. 80 g 1   valsartan -hydrochlorothiazide  (DIOVAN -HCT) 80-12.5 MG tablet Take 1 tablet by mouth daily. 90 tablet 0   Incontinence Supplies MISC 2 each by Does not apply route at bedtime. 60 each 6   No current facility-administered medications on file prior to visit.    Allergies  Allergen Reactions   Penicillins Swelling    Has patient had a PCN reaction causing immediate rash, facial/tongue/throat swelling, SOB or lightheadedness with hypotension: unknown Has patient had a PCN reaction causing severe rash involving mucus membranes or skin necrosis: unknown Has  patient had a PCN reaction that required hospitalization unknown Has patient had a PCN reaction occurring within the last 10 years: no If all of the above answers are NO, then may proceed with Cephalosporin use.    Septra [Bactrim] Itching and Swelling   Aspirin  Other (See Comments)    chilhood allergy   Banana Hives   Latex Hives   Shellfish Allergy Other (See Comments)    No reaction tested positive   Strawberry Extract Hives   Tape Other (See Comments)    Skin peels away if paper tape is left on for long period of time      PE Today's Vitals   09/23/23 1146  BP: (!) 140/80  Pulse: 75  Temp: 98.1 F (36.7 C)  TempSrc: Oral  SpO2: 99%  Weight: 213 lb 12.8 oz (97 kg)   Body mass index is 32.51 kg/m.  Physical Exam Vitals reviewed.  Constitutional:      General: She is not in acute distress.    Appearance: Normal appearance.  HENT:     Head: Normocephalic and atraumatic.     Nose: Nose normal.  Eyes:     Extraocular Movements: Extraocular movements intact.     Conjunctiva/sclera: Conjunctivae normal.  Cardiovascular:     Rate and Rhythm: Normal rate and regular rhythm.     Heart sounds: No murmur heard.    No friction rub. No gallop.  Pulmonary:     Effort: Pulmonary effort is normal. No respiratory distress.     Breath sounds: Normal breath sounds. No stridor. No wheezing, rhonchi or rales.  Musculoskeletal:        General: Normal range of motion.     Cervical back: Normal range of motion.  Neurological:     General:  No focal deficit present.     Mental Status: She is alert.  Psychiatric:        Mood and Affect: Mood normal.        Behavior: Behavior normal.     Assessment and Plan:        Abnormal uterine bleeding (AUB) Assessment & Plan: Initial plan for RA TLH however with recent job change she no longer has sick leave for prolonged recovery. Medical clearance provided by primary.  Moderate risk for surgery (06/19/23). She is now requesting  endometrial ablation.  She is aware that this will not address uterine fibroid bulk, however it is a treatment for AUB, which is her primary concern.  Plan for hysteroscopy, dilation and curettage, NovaSure endometrial ablation.  Prior BTL.  Discussed outpatient procedure that provides management of abnormal uterine bleeding for approximately 5 years. Reviewed that recovery is usually 2 days. Risks including infections, bleeding, and damage to surrounding organs reviewed. Recommend NPO prior to midnight and reviewed medication to take on day of surgery. Dicussed use of NSAIDS as needed for pain postoperatively.  Preop checklist: Antibiotics: none DVT ppx: SCDs Postop visit: 1 week Additional clearance: none, s/p medical clearance 06/19/23   Orders: -     Ambulatory Referral For Surgery Scheduling  Generalized anxiety disorder -     hydrOXYzine  Pamoate; Take 1 capsule (25 mg total) by mouth daily as needed for anxiety.  Dispense: 30 capsule; Refill: 11  Start for anxiety PRN Continue to f/u psych  Vera LULLA Pa, MD

## 2023-09-23 NOTE — Assessment & Plan Note (Addendum)
 Initial plan for RA TLH however with recent job change she no longer has sick leave for prolonged recovery. Medical clearance provided by primary.  Moderate risk for surgery (06/19/23). She is now requesting endometrial ablation.  She is aware that this will not address uterine fibroid bulk, however it is a treatment for AUB, which is her primary concern.  Plan for hysteroscopy, dilation and curettage, NovaSure endometrial ablation.  Prior BTL.  Discussed outpatient procedure that provides management of abnormal uterine bleeding for approximately 5 years. Reviewed that recovery is usually 2 days. Risks including infections, bleeding, and damage to surrounding organs reviewed. Recommend NPO prior to midnight and reviewed medication to take on day of surgery. Dicussed use of NSAIDS as needed for pain postoperatively.  Preop checklist: Antibiotics: none DVT ppx: SCDs Postop visit: 1 week Additional clearance: none, s/p medical clearance 06/19/23

## 2023-10-02 ENCOUNTER — Other Ambulatory Visit: Payer: Self-pay

## 2023-10-02 ENCOUNTER — Encounter: Payer: Self-pay | Admitting: Obstetrics and Gynecology

## 2023-10-02 MED ORDER — MEGESTROL ACETATE 40 MG PO TABS
40.0000 mg | ORAL_TABLET | Freq: Two times a day (BID) | ORAL | 0 refills | Status: DC
  Filled 2023-10-02 – 2023-10-05 (×2): qty 60, 30d supply, fill #0

## 2023-10-02 NOTE — Telephone Encounter (Signed)
 Spoke with patient. Scheduled for endometrial ablation on 10/29/23. Took last dose of Megace  last night, requesting refill. BTL for contraceptive.   Taking Megace  40 mg BID, confirmed pharmacy on file.   Bleeding started 09/30/23, changing pad 5-6 times per day. Reports feeling lightheaded and dizzy at times when getting up to ambulate.   Denies pain, N/V, fever/chills.   Patient also asking if she needs to restart iron , states she was advised previously to stop.   Advised I will review with Dr. Dallie and f/u. Patient agreeable.   Dr. Dallie -please review and advise

## 2023-10-02 NOTE — Telephone Encounter (Signed)
 Rx refilled. Yes, she should restart daily iron .

## 2023-10-03 ENCOUNTER — Other Ambulatory Visit (HOSPITAL_COMMUNITY): Payer: Self-pay

## 2023-10-03 ENCOUNTER — Other Ambulatory Visit: Payer: Self-pay

## 2023-10-05 ENCOUNTER — Other Ambulatory Visit (HOSPITAL_COMMUNITY): Payer: Self-pay | Admitting: Physician Assistant

## 2023-10-05 DIAGNOSIS — F5105 Insomnia due to other mental disorder: Secondary | ICD-10-CM

## 2023-10-07 ENCOUNTER — Other Ambulatory Visit (HOSPITAL_COMMUNITY): Payer: Self-pay

## 2023-10-07 ENCOUNTER — Other Ambulatory Visit: Payer: Self-pay

## 2023-10-08 ENCOUNTER — Other Ambulatory Visit: Payer: Self-pay

## 2023-10-08 ENCOUNTER — Ambulatory Visit (INDEPENDENT_AMBULATORY_CARE_PROVIDER_SITE_OTHER): Payer: MEDICAID | Admitting: Allergy & Immunology

## 2023-10-08 ENCOUNTER — Encounter: Payer: Self-pay | Admitting: Allergy & Immunology

## 2023-10-08 VITALS — BP 120/80 | HR 82 | Temp 98.2°F | Resp 18 | Ht 67.0 in | Wt 214.5 lb

## 2023-10-08 DIAGNOSIS — L299 Pruritus, unspecified: Secondary | ICD-10-CM

## 2023-10-08 DIAGNOSIS — T7804XD Anaphylactic reaction due to fruits and vegetables, subsequent encounter: Secondary | ICD-10-CM

## 2023-10-08 DIAGNOSIS — J4520 Mild intermittent asthma, uncomplicated: Secondary | ICD-10-CM | POA: Diagnosis not present

## 2023-10-08 DIAGNOSIS — Z91013 Allergy to seafood: Secondary | ICD-10-CM

## 2023-10-08 DIAGNOSIS — Z91018 Allergy to other foods: Secondary | ICD-10-CM | POA: Diagnosis not present

## 2023-10-08 DIAGNOSIS — J310 Chronic rhinitis: Secondary | ICD-10-CM | POA: Diagnosis not present

## 2023-10-08 DIAGNOSIS — T7804XA Anaphylactic reaction due to fruits and vegetables, initial encounter: Secondary | ICD-10-CM

## 2023-10-08 MED ORDER — CETIRIZINE HCL 10 MG PO TABS
10.0000 mg | ORAL_TABLET | Freq: Every day | ORAL | 1 refills | Status: DC
Start: 2023-10-08 — End: 2023-10-08

## 2023-10-08 MED ORDER — AZELASTINE-FLUTICASONE 137-50 MCG/ACT NA SUSP
2.0000 | NASAL | 5 refills | Status: DC
Start: 2023-10-08 — End: 2023-10-08

## 2023-10-08 NOTE — Progress Notes (Signed)
 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 Chronic rhinitis - Because of insurance stipulations, we cannot do skin testing on the same day as your first visit. - We are all working to fight this, but for now we need to do two separate visits.  - We will know more after we do testing at the next visit.  - The skin testing visit can be squeezed in at your convenience.  - Then we can make a more full plan to address all of your symptoms. - Be sure to stop your antihistamines for 3 days before this appointment (including Xyzal ).  2. Pruritus - We are going to get environmental testing at the next visit.  - We are also going to get shellfish, banana, and strawberry allergy testing.  3. Allergy to foods - We will do testing to banana, seafood, and strawberry at the next visit. - We will defer on an EpiPen at this point until we do the testing.   4. Mild intermittent asthma, uncomplicated - Lung testing looked excellent today.  - I think we can remove this from your diagnosis completely.   5. Return in about 1 week (around 10/15/2023) for ALLERGY TESTING (1-55) + STRAWBERRY + BANANA + SEAFOOD. You can have the follow up appointment with Dr. Iva or a Nurse Practicioner (our Nurse Practitioners are excellent and always have Physician oversight!).   This note in its entirety was forwarded to the Provider who requested this consultation.  Subjective:   YSABELLE GOODROE is a 53 y.o. female presenting today for evaluation of  Chief Complaint  Patient presents with   Nasal Congestion    Would like environmental testing.    Rash    When  she goes outside, breaks out into heat bumps.Lots of itching.    Food Intolerance    Avoiding strawberries, shellfish, and bananas. Caused itching and swelling.    Establish Care    SARENA JEZEK has a history of the following: Patient Active Problem List   Diagnosis Date Noted   Intramural leiomyoma of uterus 07/01/2023   Rash 06/19/2023   Urinary incontinence 06/19/2023   Preop cardiovascular exam 06/19/2023   Iron  deficiency anemia due to chronic blood loss 06/18/2023   Moderate episode of recurrent major depressive disorder (HCC) 05/29/2023   Obstructive sleep apnea 05/22/2023   Nasal congestion 05/22/2023   Encounter for screening for malignant neoplasm of breast 05/22/2023   Dysphagia 05/22/2023   Abnormal uterine bleeding (AUB) 04/15/2023   Generalized anxiety disorder 11/24/2020   Insomnia due to other mental disorder 11/24/2020   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   PTSD (post-traumatic stress disorder) 11/22/2020   Bipolar I disorder, most recent episode depressed (HCC) 11/22/2020   Essential hypertension, benign 08/28/2018   Seasonal allergies 08/28/2018   Herpes simplex infection 04/07/2018   Other tear of medial meniscus, current injury, left knee, subsequent encounter 03/27/2018   Fracture of tibial plateau, closed 03/08/2011   GERD (gastroesophageal reflux disease) 03/08/2011   Bipolar disorder (HCC) 03/08/2011   OSA (obstructive sleep apnea) 03/08/2011   Obesity 03/08/2011   Nicotine  dependence 03/08/2011    History obtained from: chart review  and patient.  Discussed the use of AI scribe software for clinical note transcription with the patient and/or guardian, who gave verbal consent to proceed.  Talmage JONETTA Hint was referred by Elnor Lauraine BRAVO, NP.     Cariah is a 53 y.o. female presenting for an evaluation of asthma and allergies.   Asthma/Respiratory Symptom History: She has a history of asthma but does not use an inhaler. She smokes  approximately six cigarettes a day, reduced from half a pack previously.  She has never done a lung test.  Allergic Rhinitis Symptom History: She experiences upper respiratory symptoms every summer and takes Sudafed and Xyzal  (levocetirizine) as needed, though not daily. No frequent sinus infections or pneumonias, but she reports a runny nose and occasional summer colds.  She has never undergone environmental allergy testing.  Food Allergy Symptom History: She has food allergies to shellfish and strawberries, which cause swelling. She avoids strawberries and experienced a severe reaction to shellfish years ago, characterized by heaving and hives within minutes of ingestion.  Skin Symptom History: She experiences itching and aching in her ears, with pain radiating to the back of her neck. These symptoms have persisted for the past three years since she moved into her current apartment. She saw an ear, nose, and throat specialist, who told her it was not a 'rhombus.' She develops hives on her neck after exposure to freshly cut grass, describing the reaction as sudden with bumps appearing on her neck. She has not tried specific treatments for environmental allergies but uses trigeminal cortisone cream for skin irritation.  GERD Symptom History: She reports difficulty swallowing, with food and water sometimes getting stuck in her throat. Despite undergoing a procedure to open her esophagus, symptoms persist. She experiences throat itching at night and a sensation of something stuck in her throat, which she attempts to cough up.  She has undergone an endoscopy and she had her esophagus stretched.  It helped for a few days, but she continues to have the symptoms.  Her endoscopy was performed in June 2025.  Endoscopy results shown below.  FINAL DIAGNOSIS       1. Surgical [P], random gastric :      REACTIVE GASTROPATHY WITH MILD CHRONIC GASTRITIS      NEGATIVE FOR H. PYLORI, INTESTINAL METAPLASIA, DYSPLASIA  AND CARCINOMA       2. Surgical [P], duodenal :      BENIGN DUODENAL MUCOSA WITH NO DIAGNOSTIC ABNORMALITY       3. Surgical [P], random sites -  esophageal :      REACTIVE SQUAMOUS MUCOSA      NEGATIVE FOR GLANDULAR EPITHELIUM, EOSINOPHILS, DYSPLASIA AND CARCINOMA       4. Surgical [P], Schatzki's ring disruption :      REACTIVE SQUAMOUS MUCOSA SHOWING EPITHELIAL HYPERPLASIA      NEGATIVE FOR GLANDULAR EPITHELIUM, EOSINOPHILS, DYSPLASIA AND CARCINOMA    Otherwise, there is no history of other atopic diseases, including drug allergies, stinging insect allergies, or contact dermatitis. There is no significant infectious history. Vaccinations are up to date.    Past Medical History: Patient Active Problem List   Diagnosis Date Noted   Intramural leiomyoma of uterus 07/01/2023   Rash 06/19/2023   Urinary incontinence 06/19/2023   Preop cardiovascular exam 06/19/2023   Iron  deficiency anemia due to chronic blood loss 06/18/2023   Moderate episode of recurrent major depressive disorder (HCC) 05/29/2023   Obstructive sleep apnea 05/22/2023   Nasal congestion 05/22/2023   Encounter  for screening for malignant neoplasm of breast 05/22/2023   Dysphagia 05/22/2023   Abnormal uterine bleeding (AUB) 04/15/2023   Generalized anxiety disorder 11/24/2020   Insomnia due to other mental disorder 11/24/2020   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   PTSD (post-traumatic stress disorder) 11/22/2020   Bipolar I disorder, most recent episode depressed (HCC) 11/22/2020   Essential hypertension, benign 08/28/2018   Seasonal allergies 08/28/2018   Herpes simplex infection 04/07/2018   Other tear of medial meniscus, current injury, left knee, subsequent encounter 03/27/2018   Fracture of tibial plateau, closed 03/08/2011   GERD (gastroesophageal reflux disease) 03/08/2011   Bipolar disorder (HCC) 03/08/2011   OSA (obstructive sleep apnea) 03/08/2011   Obesity 03/08/2011   Nicotine   dependence 03/08/2011    Medication List:  Allergies as of 10/08/2023       Reactions   Penicillins Swelling   Has patient had a PCN reaction causing immediate rash, facial/tongue/throat swelling, SOB or lightheadedness with hypotension: unknown Has patient had a PCN reaction causing severe rash involving mucus membranes or skin necrosis: unknown Has patient had a PCN reaction that required hospitalization unknown Has patient had a PCN reaction occurring within the last 10 years: no If all of the above answers are NO, then may proceed with Cephalosporin use.   Septra [bactrim] Itching, Swelling   Aspirin  Other (See Comments)   chilhood allergy   Banana Hives   Latex Hives   Shellfish Allergy Other (See Comments)   No reaction tested positive   Strawberry Extract Hives   Tape Other (See Comments)   Skin peels away if paper tape is left on for long period of time        Medication List        Accurate as of October 08, 2023  3:22 PM. If you have any questions, ask your nurse or doctor.          atorvastatin  20 MG tablet Commonly known as: LIPITOR Take 1 tablet (20 mg total) by mouth daily.   esomeprazole  20 MG capsule Commonly known as: NexIUM  Take 1 capsule (20 mg total) by mouth 2 (two) times daily before a meal.   fluticasone  50 MCG/ACT nasal spray Commonly known as: FLONASE  Place 2 sprays into both nostrils daily.   hydrOXYzine  25 MG capsule Commonly known as: VISTARIL  Take 1 capsule (25 mg total) by mouth daily as needed for anxiety.   Incontinence Supplies Misc 2 each by Does not apply route at bedtime.   levocetirizine 5 MG tablet Commonly known as: XYZAL  Take 1 tablet (5 mg total) by mouth every evening.   megestrol  40 MG tablet Commonly known as: MEGACE  Take 1 tablet (40 mg total) by mouth 2 (two) times daily.   ondansetron  4 MG disintegrating tablet Commonly known as: ZOFRAN -ODT Take 1 tablet (4 mg total) by mouth every 8 (eight) hours as  needed for nausea or vomiting.   pantoprazole  40 MG tablet Commonly known as: PROTONIX  Take 1 tablet (40 mg total) by mouth daily.   traZODone  100 MG tablet Commonly known as: DESYREL  Take 1 tablet (100 mg total) by mouth at bedtime.   triamcinolone  cream 0.1 % Commonly known as: KENALOG  Apply 1 Application topically 2 (two) times daily.   valsartan -hydrochlorothiazide  80-12.5 MG tablet Commonly known as: DIOVAN -HCT Take 1 tablet by mouth daily.        Birth History: non-contributory  Developmental History: non-contributory  Past Surgical History: Past Surgical History:  Procedure Laterality Date   CESAREAN SECTION  COLONOSCOPY     KNEE ARTHROSCOPY  06/15/2011   Procedure: ARTHROSCOPY KNEE;  Surgeon: Ozell VEAR Bruch, MD;  Location: [facility] OR;  Service: Orthopedics;  Laterality: Left;  LEFT KNEE SCOPE WITH LYSIS OF ADHESIONS AND MANIPULATION   KNEE ARTHROSCOPY WITH MEDIAL MENISECTOMY Left 03/27/2018   Procedure: LEFT KNEE ARTHROSCOPY WITH PARTIAL MEDIAL MENISCECTOMY;  Surgeon: Vernetta Lonni GRADE, MD;  Location: [facility];  Service: Orthopedics;  Laterality: Left;   ORIF TIBIA PLATEAU  03/08/2011   Procedure: OPEN REDUCTION INTERNAL FIXATION (ORIF) TIBIAL PLATEAU;  Surgeon: Ozell VEAR Bruch;  Location: [facility] OR;  Service: Orthopedics;  Laterality: Left;   TUBAL LIGATION     UPPER GASTROINTESTINAL ENDOSCOPY       Family History: Family History  Problem Relation Age of Onset   Urticaria Mother    Eczema Mother    Asthma Mother    Angioedema Mother    Allergic rhinitis Mother    Cystic fibrosis Mother    Irritable bowel syndrome Mother    Allergic rhinitis Father    Thyroid disease Father    Colon cancer Father    Graves' disease Father    Cystic fibrosis Sister    Heart disease Brother    Heart failure Brother    Esophageal cancer Paternal Grandmother    Anesthesia problems Neg Hx    Breast cancer Neg Hx    Colon polyps Neg Hx    Rectal  cancer Neg Hx    Stomach cancer Neg Hx    Diabetes Neg Hx      Social History: Pattiann lives at home with her family.  She lives in an apartment that is around 53 years old.  There is no pony throughout the home.  They have electric heating and window units for cooling.  There are no animals outside of the home.  There are no dust mite covers on the bedding.  There is tobacco exposure in the house.  She smokes around 6 cigarettes a day.  She currently works as a Conservation officer, nature.  There is no fume, chemical, and dust exposure otherwise.   Her social history includes a past drug addiction and a history of incarceration. She has four children, two of whom are currently incarcerated, and fifteen grandchildren.   Review of systems otherwise negative other than that mentioned in the HPI.    Objective:   Blood pressure 120/80, pulse 82, temperature 98.2 F (36.8 C), resp. rate 18, height 5' 7 (1.702 m), weight 214 lb 8 oz (97.3 kg), SpO2 98%. Body mass index is 33.6 kg/m.     Physical Exam Vitals reviewed.  Constitutional:      Appearance: She is well-developed.  HENT:     Head: Normocephalic and atraumatic.     Right Ear: Tympanic membrane, ear canal and external ear normal. No drainage, swelling or tenderness. Tympanic membrane is not injected, scarred, erythematous, retracted or bulging.     Left Ear: Tympanic membrane, ear canal and external ear normal. No drainage, swelling or tenderness. Tympanic membrane is not injected, scarred, erythematous, retracted or bulging.     Nose: No nasal deformity, septal deviation, mucosal edema or rhinorrhea.     Right Turbinates: Enlarged, swollen and pale.     Left Turbinates: Enlarged, swollen and pale.     Right Sinus: No maxillary sinus tenderness or frontal sinus tenderness.     Left Sinus: No maxillary sinus tenderness or frontal sinus tenderness.     Mouth/Throat:  Mouth: Mucous membranes are not pale and not dry.     Pharynx: Uvula  midline.  Eyes:     General: Lids are normal. Allergic shiner present.        Right eye: No discharge.        Left eye: No discharge.     Conjunctiva/sclera: Conjunctivae normal.     Right eye: Right conjunctiva is not injected. No chemosis.    Left eye: Left conjunctiva is not injected. No chemosis.    Pupils: Pupils are equal, round, and reactive to light.  Cardiovascular:     Rate and Rhythm: Normal rate and regular rhythm.     Heart sounds: Normal heart sounds.  Pulmonary:     Effort: Pulmonary effort is normal. No tachypnea, accessory muscle usage or respiratory distress.     Breath sounds: Normal breath sounds. No wheezing, rhonchi or rales.  Chest:     Chest wall: No tenderness.  Abdominal:     Tenderness: There is no abdominal tenderness. There is no guarding or rebound.  Lymphadenopathy:     Head:     Right side of head: No submandibular, tonsillar or occipital adenopathy.     Left side of head: No submandibular, tonsillar or occipital adenopathy.     Cervical: No cervical adenopathy.  Skin:    Coloration: Skin is not pale.     Findings: No abrasion, erythema, petechiae or rash. Rash is not papular, urticarial or vesicular.  Neurological:     Mental Status: She is alert.  Psychiatric:        Behavior: Behavior is cooperative.      Diagnostic studies:    Spirometry: results normal (FEV1: 2.02/80%, FVC: 3.09/97%, FEV1/FVC: 65%).    Spirometry consistent with normal pattern.   Allergy Studies: deferred due to insurance stipulations that require a separate visit for testing          Marty Shaggy, MD [facility] 

## 2023-10-08 NOTE — Patient Instructions (Addendum)
 1. Chronic rhinitis - Because of insurance stipulations, we cannot do skin testing on the same day as your first visit. - We are all working to fight this, but for now we need to do two separate visits.  - We will know more after we do testing at the next visit.  - The skin testing visit can be squeezed in at your convenience.  - Then we can make a more full plan to address all of your symptoms. - Be sure to stop your antihistamines for 3 days before this appointment (including Xyzal ).  2. Pruritus - We are going to get environmental testing at the next visit.  - We are also going to get shellfish, banana, and strawberry allergy testing.  3. Allergy to foods - We will do testing to banana, seafood, and strawberry at the next visit. - We will defer on an EpiPen at this point until we do the testing.   4. Mild intermittent asthma, uncomplicated - Lung testing looked excellent today.  - I think we can remove this from your diagnosis completely.   5. Return in about 1 week (around 10/15/2023) for ALLERGY TESTING (1-55) + STRAWBERRY + BANANA + SEAFOOD. You can have the follow up appointment with Dr. Iva or a Nurse Practicioner (our Nurse Practitioners are excellent and always have Physician oversight!).    Please inform us  of any Emergency Department visits, hospitalizations, or changes in symptoms. Call us  before going to the ED for breathing or allergy symptoms since we might be able to fit you in for a sick visit. Feel free to contact us  anytime with any questions, problems, or concerns.  It was a pleasure to meet you today!  Websites that have reliable patient information: 1. American Academy of Asthma, Allergy, and Immunology: www.aaaai.org 2. Food Allergy Research and Education (FARE): foodallergy.org 3. Mothers of Asthmatics: http://www.asthmacommunitynetwork.org 4. American College of Allergy, Asthma, and Immunology: www.acaai.org      "Like" us  on Facebook and Instagram  for our latest updates!      A healthy democracy works best when [employer] participate! Make sure you are registered to vote! If you have moved or changed any of your contact information, you will need to get this updated before voting! Scan the QR codes below to learn more!

## 2023-10-09 ENCOUNTER — Other Ambulatory Visit (HOSPITAL_COMMUNITY): Payer: Self-pay

## 2023-10-13 ENCOUNTER — Other Ambulatory Visit: Payer: Self-pay | Admitting: Family Medicine

## 2023-10-14 ENCOUNTER — Other Ambulatory Visit (HOSPITAL_COMMUNITY): Payer: Self-pay

## 2023-10-14 ENCOUNTER — Other Ambulatory Visit: Payer: Self-pay

## 2023-10-15 ENCOUNTER — Encounter: Payer: Self-pay | Admitting: Pharmacist

## 2023-10-15 ENCOUNTER — Other Ambulatory Visit: Payer: Self-pay

## 2023-10-17 ENCOUNTER — Ambulatory Visit: Payer: MEDICAID | Admitting: Allergy & Immunology

## 2023-10-17 ENCOUNTER — Other Ambulatory Visit (HOSPITAL_COMMUNITY): Payer: Self-pay

## 2023-10-18 ENCOUNTER — Encounter: Payer: Self-pay | Admitting: *Deleted

## 2023-10-18 ENCOUNTER — Ambulatory Visit: Admitting: Gastroenterology

## 2023-10-18 ENCOUNTER — Other Ambulatory Visit: Payer: Self-pay

## 2023-10-18 ENCOUNTER — Other Ambulatory Visit (HOSPITAL_COMMUNITY): Payer: Self-pay

## 2023-10-19 ENCOUNTER — Other Ambulatory Visit (HOSPITAL_COMMUNITY): Payer: Self-pay

## 2023-10-21 ENCOUNTER — Encounter: Payer: Self-pay | Admitting: Obstetrics and Gynecology

## 2023-10-21 ENCOUNTER — Ambulatory Visit (INDEPENDENT_AMBULATORY_CARE_PROVIDER_SITE_OTHER): Payer: MEDICAID | Admitting: Obstetrics and Gynecology

## 2023-10-21 VITALS — BP 136/84 | HR 80 | Temp 98.3°F | Wt 213.0 lb

## 2023-10-21 DIAGNOSIS — D251 Intramural leiomyoma of uterus: Secondary | ICD-10-CM

## 2023-10-21 DIAGNOSIS — N939 Abnormal uterine and vaginal bleeding, unspecified: Secondary | ICD-10-CM | POA: Diagnosis not present

## 2023-10-21 MED ORDER — OXYCODONE HCL 5 MG PO TABS: 5.0000 mg | ORAL_TABLET | ORAL | 0 refills | Status: AC | PRN

## 2023-10-21 MED ORDER — IBUPROFEN 800 MG PO TABS: 800.0000 mg | ORAL_TABLET | Freq: Three times a day (TID) | ORAL | 0 refills | Status: AC | PRN

## 2023-10-21 NOTE — Assessment & Plan Note (Addendum)
 Plan for hysteroscopy, dilation and curettage, NovaSure endometrial ablation, possible Mirena IUD insertion. Prior BTL. Plan for Mirena insertion if unable to perform endometrial ablation. Patient in agreement.  Discussed outpatient procedure that provides management of abnormal uterine bleeding for approximately 5-7 years. Reviewed that recovery is usually 2 days. Risks including infections, bleeding, and damage to surrounding organs reviewed. Recommend NPO prior to midnight and reviewed medication to take on day of surgery. Dicussed use of NSAIDS as needed for pain postoperatively.  Preop checklist: Antibiotics: none DVT ppx: SCDs Postop visit: 1 week Additional clearance: none, s/p medical clearance 06/19/23

## 2023-10-21 NOTE — Progress Notes (Signed)
 53 y.o. [id number] female with prior CDx1+BTL, AUB on megace , uterine fibroids here for surgical consult. Significant Other x 5 years.  Recent job change. Her her past medical history is complicated by hypertension, GERD, tobacco use, COPD, history of substance abuse, bipolar disorder, anxiety (now seeing Dr. Collene).  She was referred from PCP for uterine fibroids, heavy periods, passing clots.   No LMP recorded (lmp unknown). Patient is perimenopausal.  At 04/15/23 appt, she reported: longstanding history of heavy periods.  However her cycles have become more irregular.  She only had 2 cycles in 2024- April and December. Her last cycle lasted almost 2 months.  She also experienced severe cramping, back pain, lightheadedness. She stopped the Megace  2 weeks ago and never started Lysteda .  She did not feel that the Megace  was helping.  Had nightmares when trying to use nicotine  patches. She has been able to get in with psychiatry, however she has stopped the lithium  and clonidine  and is still reporting difficulty with anxiety and panic attacks.  She is still taking the trazodone . Started hydroxyzine  at last appt. Doing well today. No CP or SOB.  Work-up to date: 03/11/23 FSH 7, TSH wnl 04/10/23 Hgb 11.7, Plt 429k  03/26/23 TVUS:   Uterus  Measurements: 10.4 x 9.3 x 6.1 cm = volume: 307.5 mL. Lobular myometrium with mass lesions consistent with fibroids. Example left fundal measures 5.5 x 5.3 x 7.6 cm. Central left 4.6 x 3.5 x 4.4 cm and right 2.7 x 2.7 x 2.5 cm. These are appear larger than the prior CT scan of 2023.   Endometrium  Thickness: 13 mm.  Borderline thickened   Right and left ovary  Measurements: Obscured by bowel gas and soft tissue. Pulsed Doppler evaluation was not performed as the ovaries themselves are not well seen   Other findings  No abnormal free fluid.   IMPRESSION: Multiple uterine fibroids. The largest measures up to 7.6 cm. These are slightly larger than the prior  CT scan. Please correlate with clinical history and if needed additional workup with MRI to further delineate enhancement and signal characteristics.   Borderline thickened endometrium at 13 mm.   Poor visualization of the ovaries.     Component Value Date/Time   DIAGPAP  04/15/2023 1523    - Negative for Intraepithelial Lesions or Malignancy (NILM)   DIAGPAP - Benign reactive/reparative changes 04/15/2023 1523   DIAGPAP  06/10/2019 1042    - Negative for intraepithelial lesion or malignancy (NILM)   HPVHIGH Negative 04/15/2023 1523   HPVHIGH Negative 06/10/2019 1042   ADEQPAP  04/15/2023 1523    Satisfactory for evaluation; transformation zone component PRESENT.   ADEQPAP  06/10/2019 1042    Satisfactory for evaluation; transformation zone component PRESENT.   ADEQPAP  12/31/2016 0000    Satisfactory for evaluation  endocervical/transformation zone component PRESENT.  04/23/23: EMB benign  Birth control: BTL Sexually active: yes    GYN HISTORY: No significant history  OB History  Gravida Para Term Preterm AB Living  8 4   4 4   SAB IAB Ectopic Multiple Live Births  2 2       # Outcome Date GA Lbr Len/2nd Weight Sex Type Anes PTL Lv  8 Para           7 Para           6 Para           5 Para  4 SAB           3 SAB           2 IAB           1 IAB             Past Medical History:  Diagnosis Date   Alcoholism (HCC)    Allergy    seasonal   Anxiety    Arthritis    Asthma    Bed wetting    Blood transfusion    1989 at [facility]   Bronchitis    Chronic bipolar disorder (HCC)    Chronic kidney disease    Depression    bipolar   Eczema    GERD (gastroesophageal reflux disease)    Headache(784.0)    occasional   Hypertension    Mental disorder    bipolar   MRSA infection 2011   left lower leg   Pancreatitis    Pyelonephritis 10/2010   Recurrent upper respiratory infection (URI)    states she has not been to MD and feels like she has   bronchitis now- greenish sputum   Shortness of breath    sometimes with exertion   Sickle cell trait (HCC)    Sickle cell trait (HCC)    Sleep apnea    CPAP   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   Urticaria    UTI (lower urinary tract infection) 10/2010    Past Surgical History:  Procedure Laterality Date   CESAREAN SECTION     COLONOSCOPY     KNEE ARTHROSCOPY  06/15/2011   Procedure: ARTHROSCOPY KNEE;  Surgeon: Ozell VEAR Bruch, MD;  Location: [facility];  Service: Orthopedics;  Laterality: Left;  LEFT KNEE SCOPE WITH LYSIS OF ADHESIONS AND MANIPULATION   KNEE ARTHROSCOPY WITH MEDIAL MENISECTOMY Left 03/27/2018   Procedure: LEFT KNEE ARTHROSCOPY WITH PARTIAL MEDIAL MENISCECTOMY;  Surgeon: Vernetta Lonni GRADE, MD;  Location: [facility];  Service: Orthopedics;  Laterality: Left;   ORIF TIBIA PLATEAU  03/08/2011   Procedure: OPEN REDUCTION INTERNAL FIXATION (ORIF) TIBIAL PLATEAU;  Surgeon: Ozell VEAR Bruch;  Location: [facility];  Service: Orthopedics;  Laterality: Left;   TUBAL LIGATION     UPPER GASTROINTESTINAL ENDOSCOPY      Current Outpatient Medications on File Prior to Visit  Medication Sig Dispense Refill   atorvastatin  (LIPITOR) 20 MG tablet Take 1 tablet (20 mg total) by mouth daily. 90 tablet 1   esomeprazole  (NEXIUM ) 20 MG capsule Take 1 capsule (20 mg total) by mouth 2 (two) times daily before a meal. 60 capsule 6   fluticasone  (FLONASE ) 50 MCG/ACT nasal spray Place 2 sprays into both nostrils daily. 16 g 6   hydrOXYzine  (VISTARIL ) 25 MG capsule Take 1 capsule (25 mg total) by mouth daily as needed for anxiety. 30 capsule 11   Incontinence Supplies MISC 2 each by Does not apply route at bedtime. 60 each 6   levocetirizine (XYZAL ) 5 MG tablet Take 1 tablet (5 mg total) by mouth every evening. 90 tablet 1   megestrol  (MEGACE ) 40 MG tablet Take 1 tablet (40 mg total) by mouth 2 (two) times daily. 60 tablet 0   ondansetron  (ZOFRAN -ODT) 4 MG disintegrating  tablet Take 1 tablet (4 mg total) by mouth every 8 (eight) hours as needed for nausea or vomiting. 30 tablet 0   pantoprazole  (PROTONIX ) 40 MG tablet Take 1 tablet (40 mg total) by mouth daily. 30  tablet 3   traZODone  (DESYREL ) 100 MG tablet Take 1 tablet (100 mg total) by mouth at bedtime. 30 tablet 1   triamcinolone  cream (KENALOG ) 0.1 % Apply 1 Application topically 2 (two) times daily. 80 g 1   valsartan -hydrochlorothiazide  (DIOVAN -HCT) 80-12.5 MG tablet Take 1 tablet by mouth daily. 90 tablet 0   No current facility-administered medications on file prior to visit.    Allergies  Allergen Reactions   Penicillins Swelling    Has patient had a PCN reaction causing immediate rash, facial/tongue/throat swelling, SOB or lightheadedness with hypotension: unknown Has patient had a PCN reaction causing severe rash involving mucus membranes or skin necrosis: unknown Has patient had a PCN reaction that required hospitalization unknown Has patient had a PCN reaction occurring within the last 10 years: no If all of the above answers are NO, then may proceed with Cephalosporin use.    Septra [Bactrim] Itching and Swelling   Aspirin  Other (See Comments)    chilhood allergy   Banana Hives   Latex Hives   Shellfish Allergy Other (See Comments)    No reaction tested positive   Strawberry Extract Hives   Tape Other (See Comments)    Skin peels away if paper tape is left on for long period of time      PE Today's Vitals   10/21/23 1415  BP: 136/84  Pulse: 80  Temp: 98.3 F (36.8 C)  TempSrc: Oral  SpO2: 98%  Weight: 213 lb (96.6 kg)   Body mass index is 33.36 kg/m.  Physical Exam Vitals reviewed.  Constitutional:      General: She is not in acute distress.    Appearance: Normal appearance.  HENT:     Head: Normocephalic and atraumatic.     Nose: Nose normal.  Eyes:     Extraocular Movements: Extraocular movements intact.     Conjunctiva/sclera: Conjunctivae normal.   Cardiovascular:     Rate and Rhythm: Normal rate and regular rhythm.     Heart sounds: No murmur heard.    No friction rub. No gallop.  Pulmonary:     Effort: Pulmonary effort is normal. No respiratory distress.     Breath sounds: Normal breath sounds. No stridor. No wheezing, rhonchi or rales.  Musculoskeletal:        General: Normal range of motion.     Cervical back: Normal range of motion.  Neurological:     General: No focal deficit present.     Mental Status: She is alert.  Psychiatric:        Mood and Affect: Mood normal.        Behavior: Behavior normal.     Assessment and Plan:        Abnormal uterine bleeding (AUB) Assessment & Plan: Plan for hysteroscopy, dilation and curettage, NovaSure endometrial ablation, possible Mirena IUD insertion. Prior BTL. Plan for Mirena insertion if unable to perform endometrial ablation. Patient in agreement.  Discussed outpatient procedure that provides management of abnormal uterine bleeding for approximately 5-7 years. Reviewed that recovery is usually 2 days. Risks including infections, bleeding, and damage to surrounding organs reviewed. Recommend NPO prior to midnight and reviewed medication to take on day of surgery. Dicussed use of NSAIDS as needed for pain postoperatively.  Preop checklist: Antibiotics: none DVT ppx: SCDs Postop visit: 1 week Additional clearance: none, s/p medical clearance 06/19/23    Intramural leiomyoma of uterus  As above  Vera LULLA Pa, MD

## 2023-10-21 NOTE — H&P (View-Only) (Signed)
 53 y.o. [id number] female with prior CDx1+BTL, AUB on megace , uterine fibroids here for surgical consult. Significant Other x 5 years.  Recent job change. Her her past medical history is complicated by hypertension, GERD, tobacco use, COPD, history of substance abuse, bipolar disorder, anxiety (now seeing Dr. Collene).  She was referred from PCP for uterine fibroids, heavy periods, passing clots.   No LMP recorded (lmp unknown). Patient is perimenopausal.  At 04/15/23 appt, she reported: longstanding history of heavy periods.  However her cycles have become more irregular.  She only had 2 cycles in 2024- April and December. Her last cycle lasted almost 2 months.  She also experienced severe cramping, back pain, lightheadedness. She stopped the Megace  2 weeks ago and never started Lysteda .  She did not feel that the Megace  was helping.  Had nightmares when trying to use nicotine  patches. She has been able to get in with psychiatry, however she has stopped the lithium  and clonidine  and is still reporting difficulty with anxiety and panic attacks.  She is still taking the trazodone . Started hydroxyzine  at last appt. Doing well today. No CP or SOB.  Work-up to date: 03/11/23 FSH 7, TSH wnl 04/10/23 Hgb 11.7, Plt 429k  03/26/23 TVUS:   Uterus  Measurements: 10.4 x 9.3 x 6.1 cm = volume: 307.5 mL. Lobular myometrium with mass lesions consistent with fibroids. Example left fundal measures 5.5 x 5.3 x 7.6 cm. Central left 4.6 x 3.5 x 4.4 cm and right 2.7 x 2.7 x 2.5 cm. These are appear larger than the prior CT scan of 2023.   Endometrium  Thickness: 13 mm.  Borderline thickened   Right and left ovary  Measurements: Obscured by bowel gas and soft tissue. Pulsed Doppler evaluation was not performed as the ovaries themselves are not well seen   Other findings  No abnormal free fluid.   IMPRESSION: Multiple uterine fibroids. The largest measures up to 7.6 cm. These are slightly larger than the prior  CT scan. Please correlate with clinical history and if needed additional workup with MRI to further delineate enhancement and signal characteristics.   Borderline thickened endometrium at 13 mm.   Poor visualization of the ovaries.     Component Value Date/Time   DIAGPAP  04/15/2023 1523    - Negative for Intraepithelial Lesions or Malignancy (NILM)   DIAGPAP - Benign reactive/reparative changes 04/15/2023 1523   DIAGPAP  06/10/2019 1042    - Negative for intraepithelial lesion or malignancy (NILM)   HPVHIGH Negative 04/15/2023 1523   HPVHIGH Negative 06/10/2019 1042   ADEQPAP  04/15/2023 1523    Satisfactory for evaluation; transformation zone component PRESENT.   ADEQPAP  06/10/2019 1042    Satisfactory for evaluation; transformation zone component PRESENT.   ADEQPAP  12/31/2016 0000    Satisfactory for evaluation  endocervical/transformation zone component PRESENT.  04/23/23: EMB benign  Birth control: BTL Sexually active: yes    GYN HISTORY: No significant history  OB History  Gravida Para Term Preterm AB Living  8 4   4 4   SAB IAB Ectopic Multiple Live Births  2 2       # Outcome Date GA Lbr Len/2nd Weight Sex Type Anes PTL Lv  8 Para           7 Para           6 Para           5 Para  4 SAB           3 SAB           2 IAB           1 IAB             Past Medical History:  Diagnosis Date   Alcoholism (HCC)    Allergy    seasonal   Anxiety    Arthritis    Asthma    Bed wetting    Blood transfusion    1989 at [facility]   Bronchitis    Chronic bipolar disorder (HCC)    Chronic kidney disease    Depression    bipolar   Eczema    GERD (gastroesophageal reflux disease)    Headache(784.0)    occasional   Hypertension    Mental disorder    bipolar   MRSA infection 2011   left lower leg   Pancreatitis    Pyelonephritis 10/2010   Recurrent upper respiratory infection (URI)    states she has not been to MD and feels like she has   bronchitis now- greenish sputum   Shortness of breath    sometimes with exertion   Sickle cell trait (HCC)    Sickle cell trait (HCC)    Sleep apnea    CPAP   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   Urticaria    UTI (lower urinary tract infection) 10/2010    Past Surgical History:  Procedure Laterality Date   CESAREAN SECTION     COLONOSCOPY     KNEE ARTHROSCOPY  06/15/2011   Procedure: ARTHROSCOPY KNEE;  Surgeon: Ozell VEAR Bruch, MD;  Location: [facility];  Service: Orthopedics;  Laterality: Left;  LEFT KNEE SCOPE WITH LYSIS OF ADHESIONS AND MANIPULATION   KNEE ARTHROSCOPY WITH MEDIAL MENISECTOMY Left 03/27/2018   Procedure: LEFT KNEE ARTHROSCOPY WITH PARTIAL MEDIAL MENISCECTOMY;  Surgeon: Vernetta Lonni GRADE, MD;  Location: [facility];  Service: Orthopedics;  Laterality: Left;   ORIF TIBIA PLATEAU  03/08/2011   Procedure: OPEN REDUCTION INTERNAL FIXATION (ORIF) TIBIAL PLATEAU;  Surgeon: Ozell VEAR Bruch;  Location: [facility];  Service: Orthopedics;  Laterality: Left;   TUBAL LIGATION     UPPER GASTROINTESTINAL ENDOSCOPY      Current Outpatient Medications on File Prior to Visit  Medication Sig Dispense Refill   atorvastatin  (LIPITOR) 20 MG tablet Take 1 tablet (20 mg total) by mouth daily. 90 tablet 1   esomeprazole  (NEXIUM ) 20 MG capsule Take 1 capsule (20 mg total) by mouth 2 (two) times daily before a meal. 60 capsule 6   fluticasone  (FLONASE ) 50 MCG/ACT nasal spray Place 2 sprays into both nostrils daily. 16 g 6   hydrOXYzine  (VISTARIL ) 25 MG capsule Take 1 capsule (25 mg total) by mouth daily as needed for anxiety. 30 capsule 11   Incontinence Supplies MISC 2 each by Does not apply route at bedtime. 60 each 6   levocetirizine (XYZAL ) 5 MG tablet Take 1 tablet (5 mg total) by mouth every evening. 90 tablet 1   megestrol  (MEGACE ) 40 MG tablet Take 1 tablet (40 mg total) by mouth 2 (two) times daily. 60 tablet 0   ondansetron  (ZOFRAN -ODT) 4 MG disintegrating  tablet Take 1 tablet (4 mg total) by mouth every 8 (eight) hours as needed for nausea or vomiting. 30 tablet 0   pantoprazole  (PROTONIX ) 40 MG tablet Take 1 tablet (40 mg total) by mouth daily. 30  tablet 3   traZODone  (DESYREL ) 100 MG tablet Take 1 tablet (100 mg total) by mouth at bedtime. 30 tablet 1   triamcinolone  cream (KENALOG ) 0.1 % Apply 1 Application topically 2 (two) times daily. 80 g 1   valsartan -hydrochlorothiazide  (DIOVAN -HCT) 80-12.5 MG tablet Take 1 tablet by mouth daily. 90 tablet 0   No current facility-administered medications on file prior to visit.    Allergies  Allergen Reactions   Penicillins Swelling    Has patient had a PCN reaction causing immediate rash, facial/tongue/throat swelling, SOB or lightheadedness with hypotension: unknown Has patient had a PCN reaction causing severe rash involving mucus membranes or skin necrosis: unknown Has patient had a PCN reaction that required hospitalization unknown Has patient had a PCN reaction occurring within the last 10 years: no If all of the above answers are NO, then may proceed with Cephalosporin use.    Septra [Bactrim] Itching and Swelling   Aspirin  Other (See Comments)    chilhood allergy   Banana Hives   Latex Hives   Shellfish Allergy Other (See Comments)    No reaction tested positive   Strawberry Extract Hives   Tape Other (See Comments)    Skin peels away if paper tape is left on for long period of time      PE Today's Vitals   10/21/23 1415  BP: 136/84  Pulse: 80  Temp: 98.3 F (36.8 C)  TempSrc: Oral  SpO2: 98%  Weight: 213 lb (96.6 kg)   Body mass index is 33.36 kg/m.  Physical Exam Vitals reviewed.  Constitutional:      General: She is not in acute distress.    Appearance: Normal appearance.  HENT:     Head: Normocephalic and atraumatic.     Nose: Nose normal.  Eyes:     Extraocular Movements: Extraocular movements intact.     Conjunctiva/sclera: Conjunctivae normal.   Cardiovascular:     Rate and Rhythm: Normal rate and regular rhythm.     Heart sounds: No murmur heard.    No friction rub. No gallop.  Pulmonary:     Effort: Pulmonary effort is normal. No respiratory distress.     Breath sounds: Normal breath sounds. No stridor. No wheezing, rhonchi or rales.  Musculoskeletal:        General: Normal range of motion.     Cervical back: Normal range of motion.  Neurological:     General: No focal deficit present.     Mental Status: She is alert.  Psychiatric:        Mood and Affect: Mood normal.        Behavior: Behavior normal.     Assessment and Plan:        Abnormal uterine bleeding (AUB) Assessment & Plan: Plan for hysteroscopy, dilation and curettage, NovaSure endometrial ablation, possible Mirena IUD insertion. Prior BTL. Plan for Mirena insertion if unable to perform endometrial ablation. Patient in agreement.  Discussed outpatient procedure that provides management of abnormal uterine bleeding for approximately 5-7 years. Reviewed that recovery is usually 2 days. Risks including infections, bleeding, and damage to surrounding organs reviewed. Recommend NPO prior to midnight and reviewed medication to take on day of surgery. Dicussed use of NSAIDS as needed for pain postoperatively.  Preop checklist: Antibiotics: none DVT ppx: SCDs Postop visit: 1 week Additional clearance: none, s/p medical clearance 06/19/23    Intramural leiomyoma of uterus  As above  Vera LULLA Pa, MD

## 2023-10-21 NOTE — Patient Instructions (Addendum)
 Preoperative instructions: Nothing to eat or drink after midnight, unless instructed differently regarding clear liquids by the anesthesia team at [facility]. Do not take any medications on the day of surgery, except those listed below: Diovan , nexium  Please follow all other instructions as provided by our surgical scheduler at [facility] and the anesthesia team at [facility].  Postoperative instructions: Take ibuprofen  as prescribed and over-the-counter Tylenol  as needed.

## 2023-10-22 ENCOUNTER — Encounter (HOSPITAL_COMMUNITY): Payer: Self-pay | Admitting: Obstetrics and Gynecology

## 2023-10-22 NOTE — Progress Notes (Signed)
 Spoke w/ via phone for pre-op interview--- [facility] needs dos---- BMP per anesthesia.        Lab results------Current EKG in Epic dated 06/20/23. COVID test -----patient states asymptomatic no test needed Arrive at -------0845 NPO after MN NO Solid Food.   Pre-Surgery Ensure or G2:  Med rec completed Medications to take morning of surgery ----- Protonix , Nexium  and Megace . Diabetic medication -----  GLP1 agonist last dose: GLP1 instructions:  Patient instructed no nail polish to be worn day of surgery Patient instructed to bring photo id and insurance card day of surgery Patient aware to have Driver (ride ) / caregiver    for 24 hours after surgery - Fiance Ilah Essex Patient Special Instructions ----- Pre-Op special Instructions -----  Patient verbalized understanding of instructions that were given at this phone interview. Patient denies chest pain, sob, fever, cough at the interview.

## 2023-10-24 ENCOUNTER — Ambulatory Visit: Payer: MEDICAID | Admitting: Allergy & Immunology

## 2023-10-29 ENCOUNTER — Encounter (HOSPITAL_COMMUNITY): Payer: Self-pay | Admitting: Obstetrics and Gynecology

## 2023-10-29 ENCOUNTER — Ambulatory Visit (HOSPITAL_COMMUNITY)
Admission: RE | Admit: 2023-10-29 | Discharge: 2023-10-29 | Disposition: A | Discharge status: Home / Self Care | Payer: MEDICAID | Attending: Obstetrics and Gynecology | Admitting: Obstetrics and Gynecology

## 2023-10-29 ENCOUNTER — Other Ambulatory Visit (HOSPITAL_COMMUNITY): Payer: Self-pay

## 2023-10-29 ENCOUNTER — Other Ambulatory Visit (HOSPITAL_COMMUNITY): Payer: Self-pay | Admitting: Physician Assistant

## 2023-10-29 ENCOUNTER — Ambulatory Visit (HOSPITAL_COMMUNITY): Payer: MEDICAID

## 2023-10-29 ENCOUNTER — Other Ambulatory Visit: Payer: Self-pay

## 2023-10-29 ENCOUNTER — Encounter (HOSPITAL_COMMUNITY)
Admission: RE | Disposition: A | Discharge status: Home / Self Care | Payer: Self-pay | Source: Home / Self Care | Attending: Obstetrics and Gynecology

## 2023-10-29 DIAGNOSIS — I10 Essential (primary) hypertension: Secondary | ICD-10-CM | POA: Diagnosis not present

## 2023-10-29 DIAGNOSIS — Z79899 Other long term (current) drug therapy: Secondary | ICD-10-CM | POA: Insufficient documentation

## 2023-10-29 DIAGNOSIS — N189 Chronic kidney disease, unspecified: Secondary | ICD-10-CM | POA: Diagnosis not present

## 2023-10-29 DIAGNOSIS — K219 Gastro-esophageal reflux disease without esophagitis: Secondary | ICD-10-CM | POA: Insufficient documentation

## 2023-10-29 DIAGNOSIS — F17210 Nicotine dependence, cigarettes, uncomplicated: Secondary | ICD-10-CM

## 2023-10-29 DIAGNOSIS — F5105 Insomnia due to other mental disorder: Secondary | ICD-10-CM

## 2023-10-29 DIAGNOSIS — D219 Benign neoplasm of connective and other soft tissue, unspecified: Secondary | ICD-10-CM

## 2023-10-29 DIAGNOSIS — F419 Anxiety disorder, unspecified: Secondary | ICD-10-CM | POA: Diagnosis not present

## 2023-10-29 DIAGNOSIS — J45909 Unspecified asthma, uncomplicated: Secondary | ICD-10-CM

## 2023-10-29 DIAGNOSIS — Z30430 Encounter for insertion of intrauterine contraceptive device: Secondary | ICD-10-CM | POA: Diagnosis not present

## 2023-10-29 DIAGNOSIS — D251 Intramural leiomyoma of uterus: Secondary | ICD-10-CM | POA: Diagnosis present

## 2023-10-29 DIAGNOSIS — J4489 Other specified chronic obstructive pulmonary disease: Secondary | ICD-10-CM | POA: Diagnosis not present

## 2023-10-29 DIAGNOSIS — F319 Bipolar disorder, unspecified: Secondary | ICD-10-CM | POA: Diagnosis not present

## 2023-10-29 DIAGNOSIS — D250 Submucous leiomyoma of uterus: Secondary | ICD-10-CM | POA: Diagnosis not present

## 2023-10-29 DIAGNOSIS — I129 Hypertensive chronic kidney disease with stage 1 through stage 4 chronic kidney disease, or unspecified chronic kidney disease: Secondary | ICD-10-CM | POA: Insufficient documentation

## 2023-10-29 DIAGNOSIS — N939 Abnormal uterine and vaginal bleeding, unspecified: Secondary | ICD-10-CM | POA: Diagnosis not present

## 2023-10-29 HISTORY — PX: INTRAUTERINE DEVICE (IUD) INSERTION: SHX5877

## 2023-10-29 HISTORY — PX: DILATATION & CURRETTAGE/HYSTEROSCOPY WITH RESECTOCOPE: SHX5572

## 2023-10-29 HISTORY — PX: HYSTEROSCOPY: SHX211

## 2023-10-29 LAB — POCT PREGNANCY, URINE: Preg Test, Ur: NEGATIVE

## 2023-10-29 LAB — BASIC METABOLIC PANEL WITH GFR
Anion gap: 11 (ref 5–15)
BUN: 8 mg/dL (ref 6–20)
CO2: 20 mmol/L — ABNORMAL LOW (ref 22–32)
Calcium: 8.7 mg/dL — ABNORMAL LOW (ref 8.9–10.3)
Chloride: 114 mmol/L — ABNORMAL HIGH (ref 98–111)
Creatinine, Ser: 0.78 mg/dL (ref 0.44–1.00)
GFR, Estimated: >60 mL/min (ref >60)
Glucose, Bld: 79 mg/dL (ref 70–99)
Potassium: 3.4 mmol/L — ABNORMAL LOW (ref 3.5–5.1)
Sodium: 145 mmol/L (ref 135–145)

## 2023-10-29 SURGERY — ABLATION, ENDOMETRIUM, HYSTEROSCOPIC
Anesthesia: General | Site: Vagina

## 2023-10-29 MED ORDER — FENTANYL CITRATE (PF) 100 MCG/2ML IJ SOLN
INTRAMUSCULAR | Status: AC
  Filled 2023-10-29: qty 2

## 2023-10-29 MED ORDER — ONDANSETRON HCL 4 MG/2ML IJ SOLN
INTRAMUSCULAR | Status: AC
  Filled 2023-10-29: qty 2

## 2023-10-29 MED ORDER — ONDANSETRON HCL 4 MG/2ML IJ SOLN: 4.0000 mg | Freq: Once | INTRAMUSCULAR | Status: DC | PRN

## 2023-10-29 MED ORDER — DEXAMETHASONE SODIUM PHOSPHATE 10 MG/ML IJ SOLN
INTRAMUSCULAR | Status: DC | PRN
  Administered 2023-10-29: 10 mg via INTRAVENOUS

## 2023-10-29 MED ORDER — LEVONORGESTREL 20 MCG/DAY IU IUD
1.0000 | INTRAUTERINE_SYSTEM | INTRAUTERINE | Status: AC
  Administered 2023-10-29: 1 via INTRAUTERINE

## 2023-10-29 MED ORDER — ACETAMINOPHEN 500 MG PO TABS
1000.0000 mg | ORAL_TABLET | ORAL | Status: AC
  Administered 2023-10-29: 1000 mg via ORAL

## 2023-10-29 MED ORDER — SODIUM CHLORIDE 0.9 % IR SOLN
Status: DC | PRN
  Administered 2023-10-29: 3000 mL

## 2023-10-29 MED ORDER — HYDROMORPHONE HCL 1 MG/ML IJ SOLN: 0.2500 mg | INTRAMUSCULAR | Status: DC | PRN

## 2023-10-29 MED ORDER — ORAL CARE MOUTH RINSE: 15.0000 mL | Freq: Once | OROMUCOSAL | Status: AC

## 2023-10-29 MED ORDER — LIDOCAINE HCL (PF) 1 % IJ SOLN
INTRAMUSCULAR | Status: DC | PRN
  Administered 2023-10-29: 10 mL

## 2023-10-29 MED ORDER — ACETAMINOPHEN 500 MG PO TABS
ORAL_TABLET | ORAL | Status: AC
  Filled 2023-10-29: qty 2

## 2023-10-29 MED ORDER — ONDANSETRON HCL 4 MG/2ML IJ SOLN
INTRAMUSCULAR | Status: DC | PRN
  Administered 2023-10-29: 4 mg via INTRAVENOUS

## 2023-10-29 MED ORDER — LIDOCAINE 2% (20 MG/ML) 5 ML SYRINGE
INTRAMUSCULAR | Status: AC
  Filled 2023-10-29: qty 5

## 2023-10-29 MED ORDER — ACETAMINOPHEN 500 MG PO TABS: 1000.0000 mg | ORAL_TABLET | Freq: Once | ORAL | Status: DC

## 2023-10-29 MED ORDER — AMISULPRIDE (ANTIEMETIC) 5 MG/2ML IV SOLN: 10.0000 mg | Freq: Once | INTRAVENOUS | Status: DC | PRN

## 2023-10-29 MED ORDER — KETOROLAC TROMETHAMINE 15 MG/ML IJ SOLN
INTRAMUSCULAR | Status: DC | PRN
  Administered 2023-10-29: 15 mg via INTRAVENOUS

## 2023-10-29 MED ORDER — FENTANYL CITRATE (PF) 250 MCG/5ML IJ SOLN
INTRAMUSCULAR | Status: DC | PRN
  Administered 2023-10-29: 25 ug via INTRAVENOUS
  Administered 2023-10-29: 50 ug via INTRAVENOUS
  Administered 2023-10-29: 25 ug via INTRAVENOUS

## 2023-10-29 MED ORDER — OXYCODONE HCL 5 MG/5ML PO SOLN: 5.0000 mg | Freq: Once | ORAL | Status: DC | PRN

## 2023-10-29 MED ORDER — PROPOFOL 10 MG/ML IV BOLUS
INTRAVENOUS | Status: DC | PRN
  Administered 2023-10-29: 20 mg via INTRAVENOUS
  Administered 2023-10-29: 200 mg via INTRAVENOUS

## 2023-10-29 MED ORDER — LEVONORGESTREL 20 MCG/DAY IU IUD
INTRAUTERINE_SYSTEM | INTRAUTERINE | Status: AC
  Filled 2023-10-29: qty 1

## 2023-10-29 MED ORDER — LIDOCAINE 2% (20 MG/ML) 5 ML SYRINGE
INTRAMUSCULAR | Status: DC | PRN
  Administered 2023-10-29: 10 mg via INTRAVENOUS
  Administered 2023-10-29: 60 mg via INTRAVENOUS

## 2023-10-29 MED ORDER — DEXAMETHASONE SODIUM PHOSPHATE 10 MG/ML IJ SOLN
INTRAMUSCULAR | Status: AC
  Filled 2023-10-29: qty 1

## 2023-10-29 MED ORDER — PHENYLEPHRINE 80 MCG/ML (10ML) SYRINGE FOR IV PUSH (FOR BLOOD PRESSURE SUPPORT)
PREFILLED_SYRINGE | INTRAVENOUS | Status: DC | PRN
  Administered 2023-10-29: 80 ug via INTRAVENOUS
  Administered 2023-10-29: 160 ug via INTRAVENOUS
  Administered 2023-10-29: 80 ug via INTRAVENOUS

## 2023-10-29 MED ORDER — OXYCODONE HCL 5 MG PO TABS: 5.0000 mg | ORAL_TABLET | Freq: Once | ORAL | Status: DC | PRN

## 2023-10-29 MED ORDER — CHLORHEXIDINE GLUCONATE 0.12 % MT SOLN
OROMUCOSAL | Status: AC
  Filled 2023-10-29: qty 15

## 2023-10-29 MED ORDER — CHLORHEXIDINE GLUCONATE 0.12 % MT SOLN
15.0000 mL | Freq: Once | OROMUCOSAL | Status: AC
  Administered 2023-10-29: 15 mL via OROMUCOSAL

## 2023-10-29 MED ORDER — SODIUM CHLORIDE 0.9 % IV SOLN: INTRAVENOUS | Status: DC

## 2023-10-29 SURGICAL SUPPLY — 16 items
ABLATOR SURESOUND NOVASURE (ABLATOR) ×3 IMPLANT
COVER MAYO STAND STRL (DRAPES) ×3 IMPLANT
DEVICE MYOSURE REACH (MISCELLANEOUS) IMPLANT
DILATOR CANAL MILEX (MISCELLANEOUS) IMPLANT
GAUZE 4X4 16PLY ~~LOC~~+RFID DBL (SPONGE) IMPLANT
GLOVE BIO SURGEON STRL SZ7 (GLOVE) ×3 IMPLANT
GLOVE BIOGEL PI IND STRL 7.0 (GLOVE) ×3 IMPLANT
GOWN STRL REUS W/ TWL XL LVL3 (GOWN DISPOSABLE) ×3 IMPLANT
IV NS 1000ML BAXH (IV SOLUTION) IMPLANT
KIT PROCEDURE FLUENT (KITS) ×3 IMPLANT
MIRENA IUD IMPLANT
PACK VAGINAL MINOR WOMEN LF (CUSTOM PROCEDURE TRAY) ×3 IMPLANT
PAD OB MATERNITY 11 LF (PERSONAL CARE ITEMS) ×3 IMPLANT
SEAL ROD LENS SCOPE MYOSURE (ABLATOR) ×3 IMPLANT
SOL .9 NS 3000ML IRR UROMATIC (IV SOLUTION) IMPLANT
TOWEL GREEN STERILE FF (TOWEL DISPOSABLE) ×3 IMPLANT

## 2023-10-29 NOTE — Op Note (Signed)
 10/29/23  [name] Hint [id number]  OPERATIVE REPORT  Preop Diagnosis: AUB, uterine fibroids  Post operative diagnosis: same  Procedure: Diagnostic hysteroscopy, dilation and curettage, myomectomy via Myosure, attempted Novasure endometrial ablation, Mirena  IUD insertion  Surgeon: Vera LULLA Pa, MD Assistant: none  Anesthesia: MAC Fluids: please see anesthesia report Fluid deficit: 570cc  Complications: None  Findings: Normal cervix. Uterine cavity measuring 13cm with 2 anterior submucosal fibroids noted. Both ostia seen. Cervical length was estimated to be 6 cm.   Estimated blood loss: Minimal  Specimens:  ID Type Source Tests Collected by Time Destination  1 : Endometrial fibroid Tissue PATH Gyn benign resection SURGICAL PATHOLOGY Pa Vera LULLA, MD 10/29/2023 1154     Disposition of specimen: Pathology    Procedure: Patient was taken to the OR where she was placed in dorsal lithotomy in stirrups. She voided prior to transport. SCDs were in place.  The patient was prepped and draped in the usual sterile fashion. An adequate timeout was obtained and everyone agreed. An exam under anesthesia was performed.  A bivalve speculum was placed inside the vagina and the cervix visualized. The cervix was grasped anteriorly with a single-tooth tenaculum. Paracervical block was performed with 1% lidocaine .  The uterus sounded to 13 cm. Sequential cervical dilation was done to 54fr, and the myosure hysteroscope was introduced into the uterine cavity under the continuous infusion of normal saline solution. Findings as noted. Myosure was inserted and submucosal fibroids were morcellated. The mysoure was also used to performed random endometrial biopsies.  The Novasure uterine sound device was used to check the measurement of the uterine cavity, and it was then removed.  The Novsure device was set with a fundal length of 6.5 cm, and it was then introduced into the endometrial cavity.  It  was set in position and the uterine width was determined to be 4.8cm.   A CO2 test was performed, and the patient passed the test.  The Novasure was then deployed with a power of 172 watts, however an error message was noted and the procedure was discontinued. The device was removed and was normal and intact on inspection. Novasure rep was present to confirm that the error occurs when 50 ohms is measured in the tissue prior to 30 seconds. No final time was noted on the screen.  The hysteroscope was reinserted and ablation of the lower uterine segment was noted. Given failed Novasure ablation, the hysteroscope was removed and a Mirena  IUD was inserted according to the manufacturer's instructions. Placement was confirmed on final hysteroscopy.  The single-tooth tenaculum which had been placed on the anterior cervical lip was removed. Hemostasis was good, and all of the vaginal instruments were removed.   All instrument, sponge and lap counts were correct x2. The patient was awakened taken to recovery room in stable condition.  Vera LULLA Pa, MD 10/29/23  12:22 PM

## 2023-10-29 NOTE — Interval H&P Note (Signed)
 Date of Initial H&P: 10/21/23  History reviewed, patient examined, asymptomatic hypertension, reviewed with anesthesia team, stable for surgery.

## 2023-10-29 NOTE — Anesthesia Postprocedure Evaluation (Signed)
 Anesthesia Post Note  Patient: [name]  Procedure(s) Performed: ATTEMPTED ABLATION, ENDOMETRIUM, (Vagina ) INSERTION, INTRAUTERINE DEVICE (Uterus) DILATATION & CURETTAGE/HYSTEROSCOPY WITH MYOSURE (Vagina )     Patient location during evaluation: PACU Anesthesia Type: General Level of consciousness: awake and alert, oriented and patient cooperative Pain management: pain level controlled Vital Signs Assessment: post-procedure vital signs reviewed and stable Respiratory status: spontaneous breathing, nonlabored ventilation and respiratory function stable Cardiovascular status: blood pressure returned to baseline and stable Postop Assessment: no apparent nausea or vomiting Anesthetic complications: no   No notable events documented.  Last Vitals:  Vitals:   10/29/23 1245 10/29/23 1300  BP: (!) 186/91 (!) 172/84  Pulse: 76 83  Resp: 17 20  Temp:  36.8 C  SpO2: 100% 100%    Last Pain:  Vitals:   10/29/23 1300  TempSrc:   PainSc: 0-No pain                 Almarie CHRISTELLA Marchi

## 2023-10-29 NOTE — Discharge Instructions (Addendum)
 May take Tylenol  beginning at 3 PM for cramping/soreness. May take Ibuprofen  beginning at 6 Pm as needed for soreness/cramping.   Post Anesthesia Home Care Instructions  Activity: Get plenty of rest for the remainder of the day. A responsible individual must stay with you for 24 hours following the procedure.  For the next 24 hours, DO NOT: -Drive a car -Advertising copywriter -Drink alcoholic beverages -Take any medication unless instructed by your physician -Make any legal decisions or sign important papers.  Meals: Start with liquid foods such as gelatin or soup. Progress to regular foods as tolerated. Avoid greasy, spicy, heavy foods. If nausea and/or vomiting occur, drink only clear liquids until the nausea and/or vomiting subsides. Call your physician if vomiting continues.  Special Instructions/Symptoms: Your throat may feel dry or sore from the anesthesia or the breathing tube placed in your throat during surgery. If this causes discomfort, gargle with warm salt water. The discomfort should disappear within 24 hours.  Call your surgeon if you experience:   1.  May shower starting 10/30/2023. 2.  Inability to urinate. 3.  Nausea and/or vomiting. 4.  Extreme swelling or bruising at the surgical site. 5.  Continued bleeding from the incision. 6.  Increased pain, redness or drainage from the incision. 7.  Problems related to your pain medication. 8.  Any problems and/or concerns 9. Pads only for any vaginal drainage, nothing in the vagina such as tampons, douching, or intercourse.

## 2023-10-29 NOTE — Anesthesia Preprocedure Evaluation (Addendum)
 Anesthesia Evaluation  Patient identified by MRN, date of birth, ID band Patient awake    Reviewed: Allergy & Precautions, NPO status , Patient's Chart, lab work & pertinent test results  Airway Mallampati: III  TM Distance: >3 FB Neck ROM: Full    Dental  (+) Teeth Intact, Dental Advisory Given   Pulmonary asthma , sleep apnea , Current Smoker Stopped using cpap  1/2ppd, smoked this AM   Pulmonary exam normal breath sounds clear to auscultation       Cardiovascular hypertension (198/90 preop, per pt normally 140-160 SBP), Pt. on medications Normal cardiovascular exam Rhythm:Regular Rate:Normal     Neuro/Psych  Headaches PSYCHIATRIC DISORDERS Anxiety Depression Bipolar Disorder      GI/Hepatic ,GERD  Controlled and Medicated,,(+)     substance abuse (remote hx substance abuse)    Endo/Other  BMI 33  Renal/GU negative Renal ROS  negative genitourinary   Musculoskeletal  (+) Arthritis , Osteoarthritis,    Abdominal  (+) + obese  Peds  Hematology  (+) Blood dyscrasia, Sickle cell trait   Anesthesia Other Findings   Reproductive/Obstetrics AUB                              Anesthesia Physical Anesthesia Plan  ASA: 3  Anesthesia Plan: General   Post-op Pain Management: Tylenol  PO (pre-op)* and Toradol  IV (intra-op)*   Induction: Intravenous  PONV Risk Score and Plan: 2 and Ondansetron , Dexamethasone , Midazolam  and Treatment may vary due to age or medical condition  Airway Management Planned: LMA  Additional Equipment: None  Intra-op Plan:   Post-operative Plan: Extubation in OR  Informed Consent: I have reviewed the patients History and Physical, chart, labs and discussed the procedure including the risks, benefits and alternatives for the proposed anesthesia with the patient or authorized representative who has indicated his/her understanding and acceptance.     Dental  advisory given  Plan Discussed with: CRNA  Anesthesia Plan Comments: (D/w patient the relationship between untreated OSA and HTN)         Anesthesia Quick Evaluation

## 2023-10-29 NOTE — Transfer of Care (Signed)
 Immediate Anesthesia Transfer of Care Note  Patient: [name]  Procedure(s) Performed: ATTEMPTED ABLATION, ENDOMETRIUM, HYSTEROSCOPIC/ DILATION AND CURETTAGE (Vagina ) INSERTION, INTRAUTERINE DEVICE (Uterus) DILATATION & CURETTAGE/HYSTEROSCOPY WITH RESECTOCOPE  Patient Location: PACU  Anesthesia Type:General  Level of Consciousness: awake, alert , and patient cooperative  Airway & Oxygen Therapy: Patient Spontanous Breathing and Patient connected to face mask oxygen  Post-op Assessment: Report given to RN and Post -op Vital signs reviewed and stable  Post vital signs: Reviewed and stable  Last Vitals:  Vitals Value Taken Time  BP 186/94 10/29/23 12:30  Temp    Pulse 85 10/29/23 12:31  Resp 18 10/29/23 12:31  SpO2 100 % 10/29/23 12:31  Vitals shown include unfiled device data.  Last Pain:  Vitals:   10/29/23 0834  TempSrc: Oral  PainSc: 7       Patients Stated Pain Goal: 6 (10/29/23 0834)  Complications: No notable events documented.

## 2023-10-29 NOTE — Anesthesia Procedure Notes (Addendum)
 Procedure Name: LMA Insertion Date/Time: 10/29/2023 11:22 AM  Performed by: Kearney Rosina SAILOR, RNPre-anesthesia Checklist: Patient identified, Patient being monitored, Timeout performed, Emergency Drugs available and Suction available Patient Re-evaluated:Patient Re-evaluated prior to induction Oxygen Delivery Method: Circle system utilized Preoxygenation: Pre-oxygenation with 100% oxygen Induction Type: IV induction Ventilation: Mask ventilation without difficulty LMA: LMA inserted LMA Size: 4.0 Tube type: Oral Number of attempts: 1 Placement Confirmation: positive ETCO2 and breath sounds checked- equal and bilateral Tube secured with: Tape Dental Injury: Teeth and Oropharynx as per pre-operative assessment  Comments: Atraumatic

## 2023-10-30 ENCOUNTER — Encounter (HOSPITAL_COMMUNITY): Payer: Self-pay | Admitting: Obstetrics and Gynecology

## 2023-10-31 ENCOUNTER — Ambulatory Visit: Payer: Self-pay | Admitting: Obstetrics and Gynecology

## 2023-10-31 LAB — SURGICAL PATHOLOGY

## 2023-11-04 NOTE — Patient Instructions (Incomplete)
 1. Chronic rhinitis/Pruritus - Skin testing today is - Copy of skin testing given - Start avoidance measures as below  - Skin testing to   2. Allergy to foods - Skin testing to banana, seafood, and strawberry were - Copy of skin test given  3. Mild intermittent asthma, uncomplicated - Lung testing looked excellent at your last office visit.  - I think we can remove this from your diagnosis completely.  -continue to decrease smoking  4. Follow up in weeks with Dr. Iva or sooner if needed   Please inform us  of any Emergency Department visits, hospitalizations, or changes in symptoms. Call us  before going to the ED for breathing or allergy symptoms since we might be able to fit you in for a sick visit. Feel free to contact us  anytime with any questions, problems, or concerns.  It was a pleasure to meet you today!  Websites that have reliable patient information: 1. American Academy of Asthma, Allergy, and Immunology: www.aaaai.org 2. Food Allergy Research and Education (FARE): foodallergy.org 3. Mothers of Asthmatics: http://www.asthmacommunitynetwork.org 4. American College of Allergy, Asthma, and Immunology: www.acaai.org   Reducing Pollen Exposure The American Academy of Allergy, Asthma and Immunology suggests the following steps to reduce your exposure to pollen during allergy seasons. Do not hang sheets or clothing out to dry; pollen may collect on these items. Do not mow lawns or spend time around freshly cut grass; mowing stirs up pollen. Keep windows closed at night.  Keep car windows closed while driving. Minimize morning activities outdoors, a time when pollen counts are usually at their highest. Stay indoors as much as possible when pollen counts or humidity is high and on windy days when pollen tends to remain in the air longer. Use air conditioning when possible.  Many air conditioners have filters that trap the pollen spores. Use a HEPA room air filter to remove  pollen form the indoor air you breathe.  Control of Mold Allergen Mold and fungi can grow on a variety of surfaces provided certain temperature and moisture conditions exist.  Outdoor molds grow on plants, decaying vegetation and soil.  The major outdoor mold, Alternaria and Cladosporium, are found in very high numbers during hot and dry conditions.  Generally, a late Summer - Fall peak is seen for common outdoor fungal spores.  Rain will temporarily lower outdoor mold spore count, but counts rise rapidly when the rainy period ends.  The most important indoor molds are Aspergillus and Penicillium.  Dark, humid and poorly ventilated basements are ideal sites for mold growth.  The next most common sites of mold growth are the bathroom and the kitchen.  Outdoor [employer] Use air conditioning and keep windows closed Avoid exposure to decaying vegetation. Avoid leaf raking. Avoid grain handling. Consider wearing a face mask if working in moldy areas.  Indoor Mold Control Maintain humidity below 50%. Clean washable surfaces with 5% bleach solution. Remove sources e.g. Contaminated carpets.  Control of Dust Mite Allergen Dust mites play a major role in allergic asthma and rhinitis. They occur in environments with high humidity wherever human skin is found. Dust mites absorb humidity from the atmosphere (ie, they do not drink) and feed on organic matter (including shed human and animal skin). Dust mites are a microscopic type of insect that you cannot see with the naked eye. High levels of dust mites have been detected from mattresses, pillows, carpets, upholstered furniture, bed covers, clothes, soft toys and any woven material. The principal allergen of  the dust mite is found in its feces. A gram of dust may contain 1,000 mites and 250,000 fecal particles. Mite antigen is easily measured in the air during house cleaning activities. Dust mites do not bite and do not cause harm to humans, other than by  triggering allergies/asthma.  Ways to decrease your exposure to dust mites in your home:  1. Encase mattresses, box springs and pillows with a mite-impermeable barrier or cover  2. Wash sheets, blankets and drapes weekly in hot water (130 F) with detergent and dry them in a dryer on the hot setting.  3. Have the room cleaned frequently with a vacuum cleaner and a damp dust-mop. For carpeting or rugs, vacuuming with a vacuum cleaner equipped with a high-efficiency particulate air (HEPA) filter. The dust mite allergic individual should not be in a room which is being cleaned and should wait 1 hour after cleaning before going into the room.  4. Do not sleep on upholstered furniture (eg, couches).  5. If possible removing carpeting, upholstered furniture and drapery from the home is ideal. Horizontal blinds should be eliminated in the rooms where the person spends the most time (bedroom, study, television room). Washable vinyl, roller-type shades are optimal.  6. Remove all non-washable stuffed toys from the bedroom. Wash stuffed toys weekly like sheets and blankets above.  7. Reduce indoor humidity to less than 50%. Inexpensive humidity monitors can be purchased at most hardware stores. Do not use a humidifier as can make the problem worse and are not recommended.  Control of Dog or Cat Allergen Avoidance is the best way to manage a dog or cat allergy. If you have a dog or cat and are allergic to dog or cats, consider removing the dog or cat from the home. If you have a dog or cat but don't want to find it a new home, or if your family wants a pet even though someone in the household is allergic, here are some strategies that may help keep symptoms at bay:  Keep the pet out of your bedroom and restrict it to only a few rooms. Be advised that keeping the dog or cat in only one room will not limit the allergens to that room. Don't pet, hug or kiss the dog or cat; if you do, wash your hands with  soap and water. High-efficiency particulate air (HEPA) cleaners run continuously in a bedroom or living room can reduce allergen levels over time. Regular use of a high-efficiency vacuum cleaner or a central vacuum can reduce allergen levels. Giving your dog or cat a bath at least once a week can reduce airborne allergen.

## 2023-11-05 ENCOUNTER — Other Ambulatory Visit (HOSPITAL_COMMUNITY): Payer: Self-pay

## 2023-11-05 ENCOUNTER — Encounter: Payer: Self-pay | Admitting: Obstetrics and Gynecology

## 2023-11-05 ENCOUNTER — Ambulatory Visit: Payer: MEDICAID | Admitting: Family

## 2023-11-06 ENCOUNTER — Other Ambulatory Visit (HOSPITAL_COMMUNITY): Payer: Self-pay

## 2023-11-08 ENCOUNTER — Other Ambulatory Visit (HOSPITAL_COMMUNITY): Payer: Self-pay | Admitting: Physician Assistant

## 2023-11-08 ENCOUNTER — Other Ambulatory Visit: Payer: Self-pay | Admitting: Nurse Practitioner

## 2023-11-08 ENCOUNTER — Other Ambulatory Visit: Payer: Self-pay | Admitting: Medical Genetics

## 2023-11-08 ENCOUNTER — Other Ambulatory Visit (HOSPITAL_COMMUNITY): Payer: Self-pay

## 2023-11-08 DIAGNOSIS — F5105 Insomnia due to other mental disorder: Secondary | ICD-10-CM

## 2023-11-11 NOTE — Patient Instructions (Incomplete)
 1. Chronic rhinitis/Pruritus - Skin testing today is - Copy of skin testing given - Start avoidance measures as below  2. Allergy  to foods - Skin testing to banana, seafood, and strawberry were - Copy of skin test given  3. Mild intermittent asthma, uncomplicated - Lung testing looked excellent at your last office visit.  - I think we can remove this from your diagnosis completely.  -continue to decrease smoking  4. Follow up in weeks with Dr. Iva or sooner if needed   Please inform us  of any Emergency Department visits, hospitalizations, or changes in symptoms. Call us  before going to the ED for breathing or allergy  symptoms since we might be able to fit you in for a sick visit. Feel free to contact us  anytime with any questions, problems, or concerns.  It was a pleasure to meet you today!  Websites that have reliable patient information: 1. American Academy of Asthma, Allergy , and Immunology: www.aaaai.org 2. Food Allergy  Research and Education (FARE): foodallergy.org 3. Mothers of Asthmatics: http://www.asthmacommunitynetwork.org 4. American College of Allergy , Asthma, and Immunology: www.acaai.org   Reducing Pollen Exposure The American Academy of Allergy , Asthma and Immunology suggests the following steps to reduce your exposure to pollen during allergy  seasons. Do not hang sheets or clothing out to dry; pollen may collect on these items. Do not mow lawns or spend time around freshly cut grass; mowing stirs up pollen. Keep windows closed at night.  Keep car windows closed while driving. Minimize morning activities outdoors, a time when pollen counts are usually at their highest. Stay indoors as much as possible when pollen counts or humidity is high and on windy days when pollen tends to remain in the air longer. Use air conditioning when possible.  Many air conditioners have filters that trap the pollen spores. Use a HEPA room air filter to remove pollen form the  indoor air you breathe.  Control of Mold Allergen Mold and fungi can grow on a variety of surfaces provided certain temperature and moisture conditions exist.  Outdoor molds grow on plants, decaying vegetation and soil.  The major outdoor mold, Alternaria and Cladosporium, are found in very high numbers during hot and dry conditions.  Generally, a late Summer - Fall peak is seen for common outdoor fungal spores.  Rain will temporarily lower outdoor mold spore count, but counts rise rapidly when the rainy period ends.  The most important indoor molds are Aspergillus and Penicillium.  Dark, humid and poorly ventilated basements are ideal sites for mold growth.  The next most common sites of mold growth are the bathroom and the kitchen.  Outdoor [employer] Use air conditioning and keep windows closed Avoid exposure to decaying vegetation. Avoid leaf raking. Avoid grain handling. Consider wearing a face mask if working in moldy areas.  Indoor Mold Control Maintain humidity below 50%. Clean washable surfaces with 5% bleach solution. Remove sources e.g. Contaminated carpets.  Control of Dust Mite Allergen Dust mites play a major role in allergic asthma and rhinitis. They occur in environments with high humidity wherever human skin is found. Dust mites absorb humidity from the atmosphere (ie, they do not drink) and feed on organic matter (including shed human and animal skin). Dust mites are a microscopic type of insect that you cannot see with the naked eye. High levels of dust mites have been detected from mattresses, pillows, carpets, upholstered furniture, bed covers, clothes, soft toys and any woven material. The principal allergen of the dust mite is found in  its feces. A gram of dust may contain 1,000 mites and 250,000 fecal particles. Mite antigen is easily measured in the air during house cleaning activities. Dust mites do not bite and do not cause harm to humans, other than by triggering  allergies/asthma.  Ways to decrease your exposure to dust mites in your home:  1. Encase mattresses, box springs and pillows with a mite-impermeable barrier or cover  2. Wash sheets, blankets and drapes weekly in hot water (130 F) with detergent and dry them in a dryer on the hot setting.  3. Have the room cleaned frequently with a vacuum cleaner and a damp dust-mop. For carpeting or rugs, vacuuming with a vacuum cleaner equipped with a high-efficiency particulate air (HEPA) filter. The dust mite allergic individual should not be in a room which is being cleaned and should wait 1 hour after cleaning before going into the room.  4. Do not sleep on upholstered furniture (eg, couches).  5. If possible removing carpeting, upholstered furniture and drapery from the home is ideal. Horizontal blinds should be eliminated in the rooms where the person spends the most time (bedroom, study, television room). Washable vinyl, roller-type shades are optimal.  6. Remove all non-washable stuffed toys from the bedroom. Wash stuffed toys weekly like sheets and blankets above.  7. Reduce indoor humidity to less than 50%. Inexpensive humidity monitors can be purchased at most hardware stores. Do not use a humidifier as can make the problem worse and are not recommended.  Control of Dog or Cat Allergen Avoidance is the best way to manage a dog or cat allergy . If you have a dog or cat and are allergic to dog or cats, consider removing the dog or cat from the home. If you have a dog or cat but don't want to find it a new home, or if your family wants a pet even though someone in the household is allergic, here are some strategies that may help keep symptoms at bay:  Keep the pet out of your bedroom and restrict it to only a few rooms. Be advised that keeping the dog or cat in only one room will not limit the allergens to that room. Don't pet, hug or kiss the dog or cat; if you do, wash your hands with soap and  water. High-efficiency particulate air (HEPA) cleaners run continuously in a bedroom or living room can reduce allergen levels over time. Regular use of a high-efficiency vacuum cleaner or a central vacuum can reduce allergen levels. Giving your dog or cat a bath at least once a week can reduce airborne allergen.

## 2023-11-12 ENCOUNTER — Other Ambulatory Visit (HOSPITAL_COMMUNITY): Payer: Self-pay

## 2023-11-12 ENCOUNTER — Ambulatory Visit (INDEPENDENT_AMBULATORY_CARE_PROVIDER_SITE_OTHER): Payer: MEDICAID | Admitting: Obstetrics and Gynecology

## 2023-11-12 ENCOUNTER — Encounter: Payer: Self-pay | Admitting: Family

## 2023-11-12 ENCOUNTER — Encounter: Payer: Self-pay | Admitting: Obstetrics and Gynecology

## 2023-11-12 ENCOUNTER — Ambulatory Visit (INDEPENDENT_AMBULATORY_CARE_PROVIDER_SITE_OTHER): Payer: MEDICAID | Admitting: Family

## 2023-11-12 VITALS — BP 132/78 | HR 97 | Temp 97.8°F | Wt 219.0 lb

## 2023-11-12 DIAGNOSIS — Z91018 Allergy to other foods: Secondary | ICD-10-CM | POA: Diagnosis not present

## 2023-11-12 DIAGNOSIS — N939 Abnormal uterine and vaginal bleeding, unspecified: Secondary | ICD-10-CM

## 2023-11-12 DIAGNOSIS — Z91013 Allergy to seafood: Secondary | ICD-10-CM | POA: Diagnosis not present

## 2023-11-12 DIAGNOSIS — Z975 Presence of (intrauterine) contraceptive device: Secondary | ICD-10-CM | POA: Insufficient documentation

## 2023-11-12 DIAGNOSIS — J302 Other seasonal allergic rhinitis: Secondary | ICD-10-CM

## 2023-11-12 DIAGNOSIS — T7804XA Anaphylactic reaction due to fruits and vegetables, initial encounter: Secondary | ICD-10-CM

## 2023-11-12 DIAGNOSIS — J3089 Other allergic rhinitis: Secondary | ICD-10-CM

## 2023-11-12 DIAGNOSIS — Z09 Encounter for follow-up examination after completed treatment for conditions other than malignant neoplasm: Secondary | ICD-10-CM

## 2023-11-12 DIAGNOSIS — L509 Urticaria, unspecified: Secondary | ICD-10-CM

## 2023-11-12 DIAGNOSIS — D251 Intramural leiomyoma of uterus: Secondary | ICD-10-CM

## 2023-11-12 MED ORDER — EPINEPHRINE 0.3 MG/0.3ML IJ SOAJ: 0.3000 mg | INTRAMUSCULAR | 1 refills | Status: DC | PRN

## 2023-11-12 NOTE — Progress Notes (Signed)
 Date of Service/Encounter:  11/12/23  Allergy  testing appointment   Initial visit on 10/08/23, seen for chronic rhinitis, pruritus multiple triggers, allergy  to banana,shellfish, strawberry and mild intermittent asthma.  Please see that note for additional details.  Today reports for allergy  diagnostic testing:    DIAGNOSTICS:  Skin Testing: Environmental allergy  panel and select foods. Adequate positive and negative controls Results discussed with patient/family.  Airborne Adult Perc - 11/12/23 1500     Time Antigen Placed 1442    Allergen Manufacturer Jestine    Location Back    Number of Test 55    1. Control-Buffer 50% Glycerol Negative    2. Control-Histamine 3+    3. Bahia Negative    4. French Southern [place] Negative    5. Johnson Negative    6. [place]  Blue Negative    7. Meadow Fescue 3+    8. Perennial Rye Negative    9. Timothy 4+    10. Ragweed Mix 2+    11. Cocklebur Negative    12. Plantain,  English 2+    13. Baccharis Negative    14. Dog Fennel Negative    15. Russian Thistle Negative    16. Lamb's Quarters 2+    17. Sheep Sorrell Negative    18. Rough Pigweed Negative    19. Marsh Elder, Rough Negative    20. Mugwort, Common Negative    21. Box, Elder Negative    22. Cedar, red Negative    23. Sweet Gum Negative    24. Pecan Pollen 2+    25. Pine Mix Negative    26. Walnut, Black Pollen 2+    27. Red Mulberry Negative    28. Ash Mix Negative    29. Birch Mix Negative    30. Beech American Negative    31. Cottonwood, [place] Negative    32. Hickory, White 3+    33. Maple Mix Negative    34. Oak, [place] Mix 2+    35. Sycamore Eastern Negative    36. Alternaria Alternata Negative    37. Cladosporium Herbarum Negative    38. Aspergillus Mix Negative    39. Penicillium Mix Negative    40. Bipolaris Sorokiniana (Helminthosporium) Negative    41. Drechslera Spicifera (Curvularia) Negative    42. Mucor Plumbeus Negative    43. Fusarium Moniliforme Negative     44. Aureobasidium Pullulans (pullulara) Negative    45. Rhizopus Oryzae Negative    46. Botrytis Cinera Negative    47. Epicoccum Nigrum Negative    48. Phoma Betae Negative    49. Dust Mite Mix 2+    50. Cat Hair 10,000 BAU/ml Negative    51.  Dog Epithelia Negative    52. Mixed Feathers Negative    53. Horse Epithelia Negative    54. Cockroach, German 2+    55. Tobacco Leaf Negative          Intradermal - 11/12/23 1500     Time Antigen Placed 1528    Allergen Manufacturer Jestine    Location Arm    Number of Test 7    Control Negative    Johnson 3+    Mold 1 3+    Mold 2 3+    Mold 3 3+    Mold 4 3+    Cat Negative    Dog Negative          Food Adult Perc - 11/12/23 1500     Time Antigen Placed 1500  Allergen Manufacturer Greer    Location Arm    Number of allergen test 14    8. Shellfish Mix --   7x5   9. Fish Mix Negative    18. Trout Negative    19. Tuna Negative    20. Salmon Negative    21. Flounder Negative    22. Codfish Negative    23. Shrimp --   7x5   24. Crab 2+    25. Lobster --   6x7   26. Oyster Negative    27. Scallops Negative    57. Banana Negative    60. Strawberry Negative             Allergy  testing results were read and interpreted by myself, documented by clinical staff.  Patient provided with copy of allergy  testing along with avoidance measures when indicated.   1. Allergic rhinitis/Pruritus - Skin testing today is positive to grass pollen, weed pollen, tree pollen, dust mite mix, and [place] cockroach with adequate controls. Intradermal skin testing was positive to Johnson grass, major mold mix 1, major mold mix 2, major mold mix 3, and major mold mix 4 - Copy of skin testing given - Start avoidance measures as below - Zyrtec  10 mg given at 3:17 PM per patient's request  2. Allergy  to foods - Skin testing today was positive to shellfish mix, shrimp, and lobster with adequate controls -Continue to avoid banana,  seafood, and strawberry.  Will get lab work to fish, banana and strawberry.  We will call you with results once they are back - Copy of skin test given - Emergency Action Plan given and reviewed -EpiPen  demonstration given  3. Mild intermittent asthma, uncomplicated - Lung testing looked excellent at your last office visit.  - I think we can remove this from your diagnosis completely.  -continue to decrease smoking  4. Follow up in 4-6 weeks with Dr. Iva or sooner if needed   Please inform us  of any Emergency Department visits, hospitalizations, or changes in symptoms. Call us  before going to the ED for breathing or allergy  symptoms since we might be able to fit you in for a sick visit. Feel free to contact us  anytime with any questions, problems, or concerns.  It was a pleasure to meet you today!  Websites that have reliable patient information: 1. American Academy of Asthma, Allergy , and Immunology: www.aaaai.org 2. Food Allergy  Research and Education (FARE): foodallergy.org 3. Mothers of Asthmatics: http://www.asthmacommunitynetwork.org 4. [employer] of Allergy , Asthma, and Immunology: www.acaai.org   Reducing Pollen Exposure The American Academy of Allergy , Asthma and Immunology suggests the following steps to reduce your exposure to pollen during allergy  seasons. Do not hang sheets or clothing out to dry; pollen may collect on these items. Do not mow lawns or spend time around freshly cut grass; mowing stirs up pollen. Keep windows closed at night.  Keep car windows closed while driving. Minimize morning activities outdoors, a time when pollen counts are usually at their highest. Stay indoors as much as possible when pollen counts or humidity is high and on windy days when pollen tends to remain in the air longer. Use air conditioning when possible.  Many air conditioners have filters that trap the pollen spores. Use a HEPA room air filter to remove pollen form the  indoor air you breathe.  Control of Mold Allergen Mold and fungi can grow on a variety of surfaces provided certain temperature and moisture conditions exist.  Outdoor molds grow on  plants, decaying vegetation and soil.  The major outdoor mold, Alternaria and Cladosporium, are found in very high numbers during hot and dry conditions.  Generally, a late Summer - Fall peak is seen for common outdoor fungal spores.  Rain will temporarily lower outdoor mold spore count, but counts rise rapidly when the rainy period ends.  The most important indoor molds are Aspergillus and Penicillium.  Dark, humid and poorly ventilated basements are ideal sites for mold growth.  The next most common sites of mold growth are the bathroom and the kitchen.  Outdoor [employer] Use air conditioning and keep windows closed Avoid exposure to decaying vegetation. Avoid leaf raking. Avoid grain handling. Consider wearing a face mask if working in moldy areas.  Indoor Mold Control Maintain humidity below 50%. Clean washable surfaces with 5% bleach solution. Remove sources e.g. Contaminated carpets.  Control of Dust Mite Allergen Dust mites play a major role in allergic asthma and rhinitis. They occur in environments with high humidity wherever human skin is found. Dust mites absorb humidity from the atmosphere (ie, they do not drink) and feed on organic matter (including shed human and animal skin). Dust mites are a microscopic type of insect that you cannot see with the naked eye. High levels of dust mites have been detected from mattresses, pillows, carpets, upholstered furniture, bed covers, clothes, soft toys and any woven material. The principal allergen of the dust mite is found in its feces. A gram of dust may contain 1,000 mites and 250,000 fecal particles. Mite antigen is easily measured in the air during house cleaning activities. Dust mites do not bite and do not cause harm to humans, other than by triggering  allergies/asthma.  Ways to decrease your exposure to dust mites in your home:  1. Encase mattresses, box springs and pillows with a mite-impermeable barrier or cover  2. Wash sheets, blankets and drapes weekly in hot water (130 F) with detergent and dry them in a dryer on the hot setting.  3. Have the room cleaned frequently with a vacuum cleaner and a damp dust-mop. For carpeting or rugs, vacuuming with a vacuum cleaner equipped with a high-efficiency particulate air (HEPA) filter. The dust mite allergic individual should not be in a room which is being cleaned and should wait 1 hour after cleaning before going into the room.  4. Do not sleep on upholstered furniture (eg, couches).  5. If possible removing carpeting, upholstered furniture and drapery from the home is ideal. Horizontal blinds should be eliminated in the rooms where the person spends the most time (bedroom, study, television room). Washable vinyl, roller-type shades are optimal.  6. Remove all non-washable stuffed toys from the bedroom. Wash stuffed toys weekly like sheets and blankets above.  7. Reduce indoor humidity to less than 50%. Inexpensive humidity monitors can be purchased at most hardware stores. Do not use a humidifier as can make the problem worse and are not recommended.   Control of Cockroach Allergen  Cockroach allergen has been identified as an important cause of acute attacks of asthma, especially in urban settings.  There are fifty-five species of cockroach that exist in the [place] , however only three, the [place], [place] and [place] species produce allergen that can affect patients with Asthma.  Allergens can be obtained from fecal particles, egg casings and secretions from cockroaches.    Remove food sources. Reduce access to water. Seal access and entry points. Spray runways with 0.5-1% Diazinon or Chlorpyrifos Blow boric acid power  under stoves and refrigerator. Place bait stations  (hydramethylnon) at feeding sites.   Wanda Craze, FNP [facility] 

## 2023-11-12 NOTE — Addendum Note (Signed)
 Addended by: NANCEE JON SAILOR on: 11/12/2023 06:13 PM   Modules accepted: Orders

## 2023-11-12 NOTE — Progress Notes (Signed)
 53 y.o. [id number] female with prior CDx1+BTL, AUB on megace , uterine fibroids here for postop exam: dx hyst, myomectomy, failed Novasure, Mirena  IUD insertion. Significant Other x 5 years.  Recent job change. Her her past medical history is complicated by hypertension, GERD, tobacco use, COPD, history of substance abuse, bipolar disorder, anxiety (now seeing Dr. Collene).  She was referred from PCP for uterine fibroids, heavy periods, passing clots.   No LMP recorded (lmp unknown). Patient is perimenopausal.  At 04/15/23 appt, she reported: longstanding history of heavy periods.  However her cycles have become more irregular.  She only had 2 cycles in 2024- April and December. Her last cycle lasted almost 2 months.  She also experienced severe cramping, back pain, lightheadedness. She stopped the Megace  2 weeks ago and never started Lysteda .  She did not feel that the Megace  was helping.  Today, she reports some mild cramping and spotting. Still using pain medication as needed. Denies N/V, abdominal pain, HVB, dysuria. Normal BM and voids.   10/29/23 path: A. ENDOMETRIAL FIBROID:  Multiple fragments of smooth muscle consistent with leiomyoma.  Scanty inactive endometrium.   Birth control: BTL Sexually active: yes    GYN HISTORY: No significant history  OB History  Gravida Para Term Preterm AB Living  8 4   4 4   SAB IAB Ectopic Multiple Live Births  2 2       # Outcome Date GA Lbr Len/2nd Weight Sex Type Anes PTL Lv  8 Para           7 Para           6 Para           5 Para           4 SAB           3 SAB           2 IAB           1 IAB             Past Medical History:  Diagnosis Date   Alcoholism (HCC)    Allergy     seasonal   Anxiety    Arthritis    Asthma    Bed wetting    Blood transfusion    1989 at [place]   Bronchitis    Chronic bipolar disorder (HCC)    Chronic kidney disease    Depression    bipolar   Eczema    GERD (gastroesophageal reflux  disease)    Headache(784.0)    occasional   Hypertension    Mental disorder    bipolar   MRSA infection 2011   left lower leg   Pancreatitis    Pyelonephritis 10/2010   Recurrent upper respiratory infection (URI)    states she has not been to MD and feels like she has  bronchitis now- greenish sputum   Shortness of breath    sometimes with exertion   Sickle cell trait (HCC)    Sickle cell trait (HCC)    Sleep apnea    CPAP   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   Urticaria    UTI (lower urinary tract infection) 10/2010    Past Surgical History:  Procedure Laterality Date   CESAREAN SECTION     COLONOSCOPY     DILATATION & CURRETTAGE/HYSTEROSCOPY WITH RESECTOCOPE  10/29/2023   Procedure: DILATATION & CURETTAGE/HYSTEROSCOPY WITH MYOSURE;  Surgeon: Dallie Vera GAILS, MD;  Location: [facility] OR;  Service: Gynecology;;   HYSTEROSCOPY N/A 10/29/2023   Procedure: ATTEMPTED ABLATION, ENDOMETRIUM,;  Surgeon: Dallie Vera GAILS, MD;  Location: [facility] OR;  Service: Gynecology;  Laterality: N/A;  NOVASURE   INTRAUTERINE DEVICE (IUD) INSERTION N/A 10/29/2023   Procedure: INSERTION, INTRAUTERINE DEVICE;  Surgeon: Dallie Vera GAILS, MD;  Location: [facility] OR;  Service: Gynecology;  Laterality: N/A;   KNEE ARTHROSCOPY  06/15/2011   Procedure: ARTHROSCOPY KNEE;  Surgeon: Ozell VEAR Bruch, MD;  [facility];  Service: Orthopedics;  Laterality: Left;  LEFT KNEE SCOPE WITH LYSIS OF ADHESIONS AND MANIPULATION   KNEE ARTHROSCOPY WITH MEDIAL MENISECTOMY Left 03/27/2018   Procedure: LEFT KNEE ARTHROSCOPY WITH PARTIAL MEDIAL MENISCECTOMY;  Surgeon: Vernetta Lonni GRADE, MD;  Location: [facility];  Service: Orthopedics;  Laterality: Left;   ORIF TIBIA PLATEAU  03/08/2011   Procedure: OPEN REDUCTION INTERNAL FIXATION (ORIF) TIBIAL PLATEAU;  Surgeon: Ozell VEAR Bruch;  Location: [facility] OR;  Service: Orthopedics;  Laterality: Left;   TUBAL LIGATION     UPPER GASTROINTESTINAL ENDOSCOPY      Current  Outpatient Medications on File Prior to Visit  Medication Sig Dispense Refill   atorvastatin  (LIPITOR) 20 MG tablet Take 1 tablet (20 mg total) by mouth daily. 90 tablet 1   esomeprazole  (NEXIUM ) 20 MG capsule Take 1 capsule (20 mg total) by mouth 2 (two) times daily before a meal. 60 capsule 6   fluticasone  (FLONASE ) 50 MCG/ACT nasal spray Place 2 sprays into both nostrils daily. 16 g 6   hydrOXYzine  (VISTARIL ) 25 MG capsule Take 1 capsule (25 mg total) by mouth daily as needed for anxiety. 30 capsule 11   Incontinence Supplies MISC 2 each by Does not apply route at bedtime. 60 each 6   levocetirizine (XYZAL ) 5 MG tablet Take 1 tablet (5 mg total) by mouth every evening. 90 tablet 1   ondansetron  (ZOFRAN -ODT) 4 MG disintegrating tablet Take 1 tablet (4 mg total) by mouth every 8 (eight) hours as needed for nausea or vomiting. 30 tablet 0   pantoprazole  (PROTONIX ) 40 MG tablet Take 1 tablet (40 mg total) by mouth daily. 30 tablet 3   traZODone  (DESYREL ) 100 MG tablet Take 1 tablet (100 mg total) by mouth at bedtime. 30 tablet 1   triamcinolone  cream (KENALOG ) 0.1 % Apply 1 Application topically 2 (two) times daily. 80 g 1   valsartan -hydrochlorothiazide  (DIOVAN -HCT) 80-12.5 MG tablet Take 1 tablet by mouth daily. 90 tablet 0   No current facility-administered medications on file prior to visit.    Allergies  Allergen Reactions   Penicillins Swelling    Has patient had a PCN reaction causing immediate rash, facial/tongue/throat swelling, SOB or lightheadedness with hypotension: unknown Has patient had a PCN reaction causing severe rash involving mucus membranes or skin necrosis: unknown Has patient had a PCN reaction that required hospitalization unknown Has patient had a PCN reaction occurring within the last 10 years: no If all of the above answers are NO, then may proceed with Cephalosporin use.    Septra [Bactrim] Itching and Swelling   Aspirin  Other (See Comments)    chilhood  allergy    Banana Hives   Latex Hives   Shellfish Allergy  Other (See Comments)    No reaction tested positive   Strawberry Extract Hives   Tape Other (See Comments)    Skin peels away if paper tape is left on for long period of time      PE Today's Vitals  11/12/23 0849  BP: 132/78  Pulse: 97  Temp: 97.8 F (36.6 C)  TempSrc: Oral  SpO2: 99%  Weight: 219 lb (99.3 kg)   Body mass index is 34.3 kg/m.  Physical Exam Vitals reviewed.  Constitutional:      General: She is not in acute distress.    Appearance: Normal appearance.  HENT:     Head: Normocephalic and atraumatic.     Nose: Nose normal.  Eyes:     Extraocular Movements: Extraocular movements intact.     Conjunctiva/sclera: Conjunctivae normal.  Pulmonary:     Effort: Pulmonary effort is normal.  Abdominal:     General: There is no distension.     Palpations: Abdomen is soft.     Tenderness: There is no abdominal tenderness.  Musculoskeletal:        General: Normal range of motion.     Cervical back: Normal range of motion.  Neurological:     General: No focal deficit present.     Mental Status: She is alert.  Psychiatric:        Mood and Affect: Mood normal.        Behavior: Behavior normal.   Declined string check today   Assessment and Plan:        Postop check Abnormal uterine bleeding (AUB) Intramural leiomyoma of uterus Uses hormone releasing intrauterine device (IUD) for contraception Normal postop exam.  Pathology reviewed with patient. Cleared to return to work. All questions answered.  RTO for string check in 4 weeks  Pranish Akhavan LULLA Pa, MD

## 2023-11-13 ENCOUNTER — Other Ambulatory Visit (HOSPITAL_COMMUNITY): Payer: Self-pay

## 2023-11-13 ENCOUNTER — Other Ambulatory Visit (HOSPITAL_BASED_OUTPATIENT_CLINIC_OR_DEPARTMENT_OTHER): Payer: Self-pay

## 2023-11-13 ENCOUNTER — Telehealth: Payer: Self-pay | Admitting: Family

## 2023-11-13 MED ORDER — EPINEPHRINE 0.3 MG/0.3ML IJ SOAJ
0.3000 mg | INTRAMUSCULAR | 1 refills | Status: DC | PRN
  Filled 2023-11-13: qty 2, 30d supply, fill #0

## 2023-11-13 MED ORDER — EPINEPHRINE 0.3 MG/0.3ML IJ SOAJ
0.3000 mg | INTRAMUSCULAR | 1 refills | Status: AC | PRN
  Filled 2023-11-13: qty 2, 7d supply, fill #0

## 2023-11-13 NOTE — Telephone Encounter (Signed)
 Called patient - DOB/DPR/Pharmacy verified - advised Epi Pen Rx sent to [facility].  Patient stated Rx needed to go to [facility] due to having no transportation - pharmacy will mail to her.  Updated pharmacy and and resent Rx.

## 2023-11-13 NOTE — Addendum Note (Signed)
 Addended by: DELPHIA BROWNING B on: 11/13/2023 04:48 PM   Modules accepted: Orders

## 2023-11-13 NOTE — Telephone Encounter (Signed)
 Pt called and stated that she does not have transportation to pick up her epi pen script at [facility]. She needs it to be sent to [facility] so it can be delivered to her. She states that this is the pharmacy she needs everything sent into going forward.

## 2023-11-14 ENCOUNTER — Telehealth: Payer: Self-pay | Admitting: Orthopedic Surgery

## 2023-11-14 ENCOUNTER — Ambulatory Visit (INDEPENDENT_AMBULATORY_CARE_PROVIDER_SITE_OTHER): Payer: MEDICAID | Admitting: Podiatry

## 2023-11-14 ENCOUNTER — Other Ambulatory Visit: Payer: Self-pay

## 2023-11-14 ENCOUNTER — Other Ambulatory Visit (HOSPITAL_COMMUNITY): Payer: Self-pay

## 2023-11-14 ENCOUNTER — Ambulatory Visit (INDEPENDENT_AMBULATORY_CARE_PROVIDER_SITE_OTHER): Payer: MEDICAID

## 2023-11-14 DIAGNOSIS — M216X2 Other acquired deformities of left foot: Secondary | ICD-10-CM

## 2023-11-14 DIAGNOSIS — M722 Plantar fascial fibromatosis: Secondary | ICD-10-CM | POA: Diagnosis not present

## 2023-11-14 DIAGNOSIS — M7741 Metatarsalgia, right foot: Secondary | ICD-10-CM | POA: Diagnosis not present

## 2023-11-14 DIAGNOSIS — M7742 Metatarsalgia, left foot: Secondary | ICD-10-CM | POA: Diagnosis not present

## 2023-11-14 DIAGNOSIS — M7740 Metatarsalgia, unspecified foot: Secondary | ICD-10-CM

## 2023-11-14 DIAGNOSIS — M216X1 Other acquired deformities of right foot: Secondary | ICD-10-CM | POA: Diagnosis not present

## 2023-11-14 MED ORDER — MELOXICAM 15 MG PO TABS
15.0000 mg | ORAL_TABLET | Freq: Every day | ORAL | 3 refills | Status: AC
  Filled 2023-11-14 (×2): qty 30, 30d supply, fill #0
  Filled 2023-12-12: qty 30, 30d supply, fill #1
  Filled 2024-01-21: qty 30, 30d supply, fill #2

## 2023-11-14 MED ORDER — METHYLPREDNISOLONE 4 MG PO TBPK
ORAL_TABLET | ORAL | 0 refills | Status: AC
  Filled 2023-11-14 (×2): qty 21, 6d supply, fill #0

## 2023-11-14 MED ORDER — TRIAMCINOLONE ACETONIDE 40 MG/ML IJ SUSP
20.0000 mg | Freq: Once | INTRAMUSCULAR | Status: AC
  Administered 2023-11-14: 20 mg

## 2023-11-14 NOTE — Telephone Encounter (Signed)
 This patient scheduled online for her back and neck.  I have lvm for her for details, but she was seen by Dr. Vernetta 02/06/21 for her back and looks like she's no showed and cancelled a lot of appointments w/[facility].  Please advise, need to stay w/[facility]?

## 2023-11-14 NOTE — Progress Notes (Signed)
 Subjective:  Patient ID: [name], female    DOB: 02-05-1971,  MRN: [id number] HPI Chief Complaint  Patient presents with   Foot Pain    Left foot: pain in bottom of foot in arch area into toes.  Right foot: pain in bottom of foot in arch and into toes as well as a  stretching sensation.     53 y.o. female presents with the above complaint.   ROS: Denies fever chills nausea muscle aches pains calf pain back pain chest pain shortness of breath.  Past Medical History:  Diagnosis Date   Alcoholism (HCC)    Allergy     seasonal   Anxiety    Arthritis    Asthma    Bed wetting    Blood transfusion    1989 at [place]   Bronchitis    Chronic bipolar disorder (HCC)    Chronic kidney disease    Depression    bipolar   Eczema    GERD (gastroesophageal reflux disease)    Headache(784.0)    occasional   Hypertension    Mental disorder    bipolar   MRSA infection 2011   left lower leg   Pancreatitis    Pyelonephritis 10/2010   Recurrent upper respiratory infection (URI)    states she has not been to MD and feels like she has  bronchitis now- greenish sputum   Shortness of breath    sometimes with exertion   Sickle cell trait (HCC)    Sickle cell trait (HCC)    Sleep apnea    CPAP   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   Urticaria    UTI (lower urinary tract infection) 10/2010   Past Surgical History:  Procedure Laterality Date   CESAREAN SECTION     COLONOSCOPY     DILATATION & CURRETTAGE/HYSTEROSCOPY WITH RESECTOCOPE  10/29/2023   Procedure: DILATATION & CURETTAGE/HYSTEROSCOPY WITH MYOSURE;  Surgeon: Dallie Vera GAILS, MD;  Location: [facility] OR;  Service: Gynecology;;   HYSTEROSCOPY N/A 10/29/2023   Procedure: ATTEMPTED ABLATION, ENDOMETRIUM,;  Surgeon: Dallie Vera GAILS, MD;  Location: [facility] OR;  Service: Gynecology;  Laterality: N/A;  NOVASURE   INTRAUTERINE DEVICE (IUD) INSERTION N/A 10/29/2023   Procedure: INSERTION, INTRAUTERINE DEVICE;  Surgeon: Dallie Vera GAILS, MD;  Location: [facility] OR;  Service: Gynecology;  Laterality: N/A;   KNEE ARTHROSCOPY  06/15/2011   Procedure: ARTHROSCOPY KNEE;  Surgeon: Ozell VEAR Bruch, MD;  Location: [facility] OR;  Service: Orthopedics;  Laterality: Left;  LEFT KNEE SCOPE WITH LYSIS OF ADHESIONS AND MANIPULATION   KNEE ARTHROSCOPY WITH MEDIAL MENISECTOMY Left 03/27/2018   Procedure: LEFT KNEE ARTHROSCOPY WITH PARTIAL MEDIAL MENISCECTOMY;  Surgeon: Vernetta Lonni GRADE, MD;  Location: [facility];  Service: Orthopedics;  Laterality: Left;   ORIF TIBIA PLATEAU  03/08/2011   Procedure: OPEN REDUCTION INTERNAL FIXATION (ORIF) TIBIAL PLATEAU;  Surgeon: Ozell VEAR Bruch;  Location: [facility] OR;  Service: Orthopedics;  Laterality: Left;   TUBAL LIGATION     UPPER GASTROINTESTINAL ENDOSCOPY      Current Outpatient Medications:    atorvastatin  (LIPITOR) 20 MG tablet, Take 1 tablet (20 mg total) by mouth daily., Disp: 90 tablet, Rfl: 1   EPINEPHrine  0.3 mg/0.3 mL IJ SOAJ injection, Inject 0.3 mg into the muscle as needed for anaphylaxis., Disp: 2 each, Rfl: 1   esomeprazole  (NEXIUM ) 20 MG capsule, Take 1 capsule (20 mg total) by mouth 2 (two) times daily before a meal., Disp:  60 capsule, Rfl: 6   fluticasone  (FLONASE ) 50 MCG/ACT nasal spray, Place 2 sprays into both nostrils daily., Disp: 16 g, Rfl: 6   hydrOXYzine  (VISTARIL ) 25 MG capsule, Take 1 capsule (25 mg total) by mouth daily as needed for anxiety., Disp: 30 capsule, Rfl: 11   Incontinence Supplies MISC, 2 each by Does not apply route at bedtime., Disp: 60 each, Rfl: 6   levocetirizine (XYZAL ) 5 MG tablet, Take 1 tablet (5 mg total) by mouth every evening., Disp: 90 tablet, Rfl: 1   meloxicam  (MOBIC ) 15 MG tablet, Take 1 tablet (15 mg total) by mouth daily., Disp: 30 tablet, Rfl: 3   methylPREDNISolone  (MEDROL  DOSEPAK) 4 MG TBPK tablet, Take as directed, Disp: 21 tablet, Rfl: 0   ondansetron  (ZOFRAN -ODT) 4 MG disintegrating tablet, Take 1 tablet (4 mg total) by mouth  every 8 (eight) hours as needed for nausea or vomiting., Disp: 30 tablet, Rfl: 0   pantoprazole  (PROTONIX ) 40 MG tablet, Take 1 tablet (40 mg total) by mouth daily., Disp: 30 tablet, Rfl: 3   traZODone  (DESYREL ) 100 MG tablet, Take 1 tablet (100 mg total) by mouth at bedtime., Disp: 30 tablet, Rfl: 1   triamcinolone  cream (KENALOG ) 0.1 %, Apply 1 Application topically 2 (two) times daily., Disp: 80 g, Rfl: 1   valsartan -hydrochlorothiazide  (DIOVAN -HCT) 80-12.5 MG tablet, Take 1 tablet by mouth daily., Disp: 90 tablet, Rfl: 0  Allergies  Allergen Reactions   Penicillins Swelling    Has patient had a PCN reaction causing immediate rash, facial/tongue/throat swelling, SOB or lightheadedness with hypotension: unknown Has patient had a PCN reaction causing severe rash involving mucus membranes or skin necrosis: unknown Has patient had a PCN reaction that required hospitalization unknown Has patient had a PCN reaction occurring within the last 10 years: no If all of the above answers are NO, then may proceed with Cephalosporin use.    Septra [Bactrim] Itching and Swelling   Aspirin  Other (See Comments)    chilhood allergy    Banana Hives   Latex Hives   Shellfish Allergy  Other (See Comments)    No reaction tested positive   Strawberry Extract Hives   Tape Other (See Comments)    Skin peels away if paper tape is left on for long period of time   Review of Systems Objective:  There were no vitals filed for this visit.  General: Well developed, nourished, in no acute distress, alert and oriented x3   Dermatological: Skin is warm, dry and supple bilateral. Nails x 10 are well maintained; remaining integument appears unremarkable at this time. There are no open sores, no preulcerative lesions, no rash or signs of infection present.  Vascular: Dorsalis Pedis artery and Posterior Tibial artery pedal pulses are 2/4 bilateral with immedate capillary fill time. Pedal hair growth present. No  varicosities and no lower extremity edema present bilateral.   Neruologic: Grossly intact via light touch bilateral. Vibratory intact via tuning fork bilateral. Protective threshold with Semmes Wienstein monofilament intact to all pedal sites bilateral. Patellar and Achilles deep tendon reflexes 2+ bilateral. No Babinski or clonus noted bilateral.   Musculoskeletal: No gross boney pedal deformities bilateral. No pain, crepitus, or limitation noted with foot and ankle range of motion bilateral. Muscular strength 5/5 in all groups tested bilateral.  She has pain on palpation medial calcaneal tubercle bilateral.  Pes planovalgus bilateral.  Gait: Unassisted, Nonantalgic.    Radiographs:  Radiographs taken today demonstrate osseously mature individual soft tissue increase in density plantar fascial  canny insertion site with pes planovalgus bilateral.  No acute findings are identified.  No fractures are identified.  Assessment & Plan:   Assessment: Plantar fasciitis with pes planovalgus bilateral.  Plan: Injected the bilateral heels today started her on methylprednisolone  to be followed by meloxicam .  Discussed appropriate shoe gear stretching exercise ice therapy and shoe gear modifications.  Will follow-up with her in 6 weeks     Sidhant Helderman T. Sunshine, [place]

## 2023-11-14 NOTE — Telephone Encounter (Signed)
 It was for 11/27/23, looks like she cancelled it since I messaged you, reason was transportation

## 2023-11-15 ENCOUNTER — Ambulatory Visit: Payer: MEDICAID | Admitting: Nurse Practitioner

## 2023-11-15 ENCOUNTER — Ambulatory Visit: Payer: Self-pay | Admitting: Family

## 2023-11-15 LAB — ALLERGEN PROFILE, FOOD-FISH
Allergen Mackerel IgE: <0.1 kU/L
Allergen Salmon IgE: 0.15 kU/L — AB
Allergen Trout IgE: <0.1 kU/L
Allergen Walley Pike IgE: <0.1 kU/L
Codfish IgE: 0.11 kU/L — AB
Halibut IgE: 0.1 kU/L — AB
Tuna: 0.24 kU/L — AB

## 2023-11-15 LAB — ALLERGEN, STRAWBERRY, F44: Allergen Strawberry IgE: <0.1 kU/L

## 2023-11-15 LAB — ALLERGEN BANANA: Allergen Banana IgE: 0.25 kU/L — AB

## 2023-11-15 NOTE — Progress Notes (Signed)
 Please let [name] know that her lab work came back.   Banana is low: Continue to avoid for now and have access to your epinephrine  auto injector device at all times  Fish is low: Continue to avoid fish for now and have access to your epinephrine  auto injector device. What was her reaction with fish in the past?  Strawberry is negative. Continue to avoid for now. Skin testing to strawberry was negative also. We can consider an in office oral food challenge to strawberries.  She would need to be strawberries with her the day of the challenge.  This appointment will last approximately 2 to 4 hours and she will need to be off all antihistamines 3 days prior to this appointment.  She will also need to be in good health the day of the challenge (no recent vaccines or antibiotics in the past 7 days).  If she is not interested in doing an in office oral food challenge to strawberries she needs to continue to avoid strawberries.  Also continue to avoid shellfish as it was positive on skin testing.

## 2023-11-18 ENCOUNTER — Telehealth: Payer: Self-pay | Admitting: Radiology

## 2023-11-18 NOTE — Telephone Encounter (Signed)
 Copied from CRM [id number]. Topic: Clinical - Medical Advice >> Nov 18, 2023 12:20 PM Corin V wrote: Reason for CRM: Patient is requesting that Lauraine Ferrari order another sleep study to be done in an office as she is still experiencing waking up with headaches, coughing throughout night, snoring is worsening and she is still having apnea episodes. Please follow up with patient at [phone].

## 2023-11-19 ENCOUNTER — Encounter: Payer: Self-pay | Admitting: Obstetrics and Gynecology

## 2023-11-19 DIAGNOSIS — G8918 Other acute postprocedural pain: Secondary | ICD-10-CM

## 2023-11-19 DIAGNOSIS — N898 Other specified noninflammatory disorders of vagina: Secondary | ICD-10-CM

## 2023-11-20 ENCOUNTER — Other Ambulatory Visit: Payer: Self-pay

## 2023-11-20 ENCOUNTER — Other Ambulatory Visit (HOSPITAL_COMMUNITY): Payer: Self-pay

## 2023-11-20 MED ORDER — IBUPROFEN 800 MG PO TABS
800.0000 mg | ORAL_TABLET | Freq: Three times a day (TID) | ORAL | 0 refills | Status: AC | PRN
  Filled 2023-11-20: qty 42, 14d supply, fill #0

## 2023-11-20 MED ORDER — FLUCONAZOLE 150 MG PO TABS
150.0000 mg | ORAL_TABLET | ORAL | 0 refills | Status: AC
  Filled 2023-11-20: qty 2, 4d supply, fill #0

## 2023-11-22 ENCOUNTER — Ambulatory Visit: Payer: MEDICAID | Admitting: Nurse Practitioner

## 2023-11-25 ENCOUNTER — Other Ambulatory Visit (HOSPITAL_COMMUNITY): Payer: Self-pay

## 2023-11-25 ENCOUNTER — Other Ambulatory Visit: Payer: Self-pay

## 2023-11-25 MED ORDER — PROPRANOLOL HCL 10 MG PO TABS
10.0000 mg | ORAL_TABLET | Freq: Every day | ORAL | 0 refills | Status: AC
  Filled 2023-11-25 (×2): qty 30, 30d supply, fill #0

## 2023-11-25 MED ORDER — LORAZEPAM 0.5 MG PO TABS
0.2500 mg | ORAL_TABLET | Freq: Every day | ORAL | 0 refills | Status: AC
  Filled 2023-11-26: qty 10, 20d supply, fill #0

## 2023-11-26 ENCOUNTER — Other Ambulatory Visit: Payer: Self-pay | Admitting: Nurse Practitioner

## 2023-11-26 ENCOUNTER — Other Ambulatory Visit: Payer: Self-pay

## 2023-11-26 ENCOUNTER — Other Ambulatory Visit (HOSPITAL_COMMUNITY): Payer: Self-pay

## 2023-11-26 DIAGNOSIS — G4733 Obstructive sleep apnea (adult) (pediatric): Secondary | ICD-10-CM

## 2023-11-27 ENCOUNTER — Ambulatory Visit: Payer: MEDICAID | Admitting: Orthopedic Surgery

## 2023-12-02 ENCOUNTER — Ambulatory Visit: Payer: MEDICAID | Admitting: Orthopedic Surgery

## 2023-12-03 ENCOUNTER — Other Ambulatory Visit: Payer: Self-pay

## 2023-12-04 ENCOUNTER — Other Ambulatory Visit (INDEPENDENT_AMBULATORY_CARE_PROVIDER_SITE_OTHER): Payer: MEDICAID

## 2023-12-04 ENCOUNTER — Encounter: Payer: Self-pay | Admitting: Orthopedic Surgery

## 2023-12-04 ENCOUNTER — Other Ambulatory Visit: Payer: MEDICAID

## 2023-12-04 ENCOUNTER — Ambulatory Visit: Payer: MEDICAID | Admitting: Orthopedic Surgery

## 2023-12-04 DIAGNOSIS — M79605 Pain in left leg: Secondary | ICD-10-CM

## 2023-12-05 ENCOUNTER — Ambulatory Visit: Payer: MEDICAID | Admitting: Orthopaedic Surgery

## 2023-12-05 NOTE — Telephone Encounter (Unsigned)
 Copied from CRM [id number]. Topic: General - Other >> Dec 05, 2023  1:03 PM Paige D wrote: Reason for CRM:  Alya calling from [facility] in regards to referral they received. Miss Gertha would like to clarify some things as pt was just seen there a couple months ago and had no further steps to take sleep study showed very mild sleep apnea based off that needed to avoid sleeping in supine and try to achieve weight loss with symptoms. Needs no cpap machine or anything like that. Miss Alya from [facility] would like a call back best contact # [phone] or can message through epic or teams
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
## 2023-12-06 ENCOUNTER — Ambulatory Visit: Payer: MEDICAID | Admitting: Nurse Practitioner

## 2023-12-12 ENCOUNTER — Other Ambulatory Visit (HOSPITAL_COMMUNITY): Payer: Self-pay

## 2023-12-12 ENCOUNTER — Other Ambulatory Visit: Payer: Self-pay

## 2023-12-12 ENCOUNTER — Ambulatory Visit: Payer: MEDICAID | Admitting: Allergy & Immunology

## 2023-12-12 ENCOUNTER — Ambulatory Visit (INDEPENDENT_AMBULATORY_CARE_PROVIDER_SITE_OTHER): Payer: MEDICAID | Admitting: Obstetrics and Gynecology

## 2023-12-12 ENCOUNTER — Encounter: Payer: Self-pay | Admitting: Obstetrics and Gynecology

## 2023-12-12 ENCOUNTER — Other Ambulatory Visit (HOSPITAL_COMMUNITY): Payer: Self-pay | Admitting: Physician Assistant

## 2023-12-12 ENCOUNTER — Ambulatory Visit: Payer: Self-pay | Admitting: Obstetrics and Gynecology

## 2023-12-12 VITALS — BP 160/84 | HR 75 | Temp 97.7°F | Wt 223.0 lb

## 2023-12-12 DIAGNOSIS — B009 Herpesviral infection, unspecified: Secondary | ICD-10-CM | POA: Diagnosis not present

## 2023-12-12 DIAGNOSIS — B9689 Other specified bacterial agents as the cause of diseases classified elsewhere: Secondary | ICD-10-CM

## 2023-12-12 DIAGNOSIS — D251 Intramural leiomyoma of uterus: Secondary | ICD-10-CM | POA: Diagnosis not present

## 2023-12-12 DIAGNOSIS — F5105 Insomnia due to other mental disorder: Secondary | ICD-10-CM

## 2023-12-12 DIAGNOSIS — N760 Acute vaginitis: Secondary | ICD-10-CM

## 2023-12-12 DIAGNOSIS — N939 Abnormal uterine and vaginal bleeding, unspecified: Secondary | ICD-10-CM | POA: Diagnosis not present

## 2023-12-12 DIAGNOSIS — Z975 Presence of (intrauterine) contraceptive device: Secondary | ICD-10-CM | POA: Diagnosis not present

## 2023-12-12 DIAGNOSIS — N898 Other specified noninflammatory disorders of vagina: Secondary | ICD-10-CM

## 2023-12-12 LAB — WET PREP FOR TRICH, YEAST, CLUE

## 2023-12-12 MED ORDER — METRONIDAZOLE 500 MG PO TABS
500.0000 mg | ORAL_TABLET | Freq: Two times a day (BID) | ORAL | 0 refills | Status: AC
  Filled 2023-12-12: qty 14, 7d supply, fill #0

## 2023-12-12 MED ORDER — VALACYCLOVIR HCL 500 MG PO TABS
500.0000 mg | ORAL_TABLET | Freq: Two times a day (BID) | ORAL | 3 refills | Status: AC
  Filled 2023-12-12: qty 30, 15d supply, fill #0

## 2023-12-12 NOTE — Progress Notes (Signed)
 53 y.o. [id number] female with prior CDx1+BTL, AUB, uterine fibroids, Mirena  IUD (placed 10/29/23, dx hyst, myomectomy, failed Novasure) here for string check. Significant Other x 5 years.  Recent job change.  She was referred from PCP for uterine fibroids, heavy periods, passing clots.   Patient's last menstrual period was 12/01/2023 (exact date).  At 04/15/23 appt, she reported: longstanding history of heavy periods.  However her cycles have become more irregular.  She only had 2 cycles in 2024- April and December. Her last cycle lasted almost 2 months.  She also experienced severe cramping, back pain, lightheadedness. She stopped the Megace  2 weeks ago and never started Lysteda .  She did not feel that the Megace  was helping.  Today, she reports some mild cramping and spotting. Periods have just been light spotting. 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 She has had ongoing vaginal itching on the left labia since. Diflucan  helped but did not resolve symptoms.  Birth control: BTL Sexually active: yes   GYN HISTORY: No significant history  OB History  Gravida Para Term Preterm AB Living  8 4   4 4   SAB IAB Ectopic Multiple Live Births  2 2       # Outcome Date GA Lbr Len/2nd Weight Sex Type Anes PTL Lv  8 Para           7 Para           6 Para           5 Para           4 SAB           3 SAB           2 IAB           1 IAB             Past Medical History:  Diagnosis Date   Alcoholism (HCC)    Allergy     seasonal   Anxiety    Arthritis    Asthma    Bed wetting    Blood transfusion    1989 at [place]   Bronchitis    Chronic bipolar disorder (HCC)    Chronic kidney disease    Depression    bipolar   Eczema    GERD (gastroesophageal reflux disease)    Headache(784.0)    occasional   Hypertension    Mental disorder    bipolar   MRSA infection 2011   left lower leg   Pancreatitis    Pyelonephritis 10/2010   Recurrent upper respiratory  infection (URI)    states she has not been to MD and feels like she has  bronchitis now- greenish sputum   Shortness of breath    sometimes with exertion   Sickle cell trait    Sickle cell trait    Sleep apnea    CPAP   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   Urticaria    UTI (lower urinary tract infection) 10/2010    Past Surgical History:  Procedure Laterality Date   CESAREAN SECTION     COLONOSCOPY     DILATATION & CURRETTAGE/HYSTEROSCOPY WITH RESECTOCOPE  10/29/2023   Procedure: DILATATION & CURETTAGE/HYSTEROSCOPY WITH MYOSURE;  Surgeon: Dallie Vera GAILS, MD;  Location: [facility] OR;  Service: Gynecology;;   HYSTEROSCOPY N/A 10/29/2023   Procedure: ATTEMPTED ABLATION, ENDOMETRIUM,;  Surgeon: Dallie Vera GAILS, MD;  Location: [facility] OR;  Service: Gynecology;  Laterality:  N/A;  NOVASURE   INTRAUTERINE DEVICE (IUD) INSERTION N/A 10/29/2023   Procedure: INSERTION, INTRAUTERINE DEVICE;  Surgeon: Dallie Vera GAILS, MD;  Location: [facility] OR;  Service: Gynecology;  Laterality: N/A;   KNEE ARTHROSCOPY  06/15/2011   Procedure: ARTHROSCOPY KNEE;  Surgeon: Ozell VEAR Bruch, MD;  Location: [facility] OR;  Service: Orthopedics;  Laterality: Left;  LEFT KNEE SCOPE WITH LYSIS OF ADHESIONS AND MANIPULATION   KNEE ARTHROSCOPY WITH MEDIAL MENISECTOMY Left 03/27/2018   Procedure: LEFT KNEE ARTHROSCOPY WITH PARTIAL MEDIAL MENISCECTOMY;  Surgeon: Vernetta Lonni GRADE, MD;  Location: [facility];  Service: Orthopedics;  Laterality: Left;   ORIF TIBIA PLATEAU  03/08/2011   Procedure: OPEN REDUCTION INTERNAL FIXATION (ORIF) TIBIAL PLATEAU;  Surgeon: Ozell VEAR Bruch;  Location: [facility] OR;  Service: Orthopedics;  Laterality: Left;   TUBAL LIGATION     UPPER GASTROINTESTINAL ENDOSCOPY      Current Outpatient Medications on File Prior to Visit  Medication Sig Dispense Refill   atorvastatin  (LIPITOR) 20 MG tablet Take 1 tablet (20 mg total) by mouth daily. 90 tablet 1   EPINEPHrine  0.3 mg/0.3 mL IJ SOAJ injection  Inject 0.3 mg into the muscle as needed for anaphylaxis. 2 each 1   esomeprazole  (NEXIUM ) 20 MG capsule Take 1 capsule (20 mg total) by mouth 2 (two) times daily before a meal. 60 capsule 6   fluticasone  (FLONASE ) 50 MCG/ACT nasal spray Place 2 sprays into both nostrils daily. 16 g 6   hydrOXYzine  (VISTARIL ) 25 MG capsule Take 1 capsule (25 mg total) by mouth daily as needed for anxiety. 30 capsule 11   ibuprofen  (ADVIL ) 800 MG tablet Take 1 tablet (800 mg total) by mouth every 8 (eight) hours as needed for pain.  Max 3 tablets per day. 42 tablet 0   Incontinence Supplies MISC 2 each by Does not apply route at bedtime. 60 each 6   levocetirizine (XYZAL ) 5 MG tablet Take 1 tablet (5 mg total) by mouth every evening. 90 tablet 1   LORazepam  (ATIVAN ) 0.5 MG tablet Take 0.5 tablets (0.25 mg total) by mouth daily as needed for agitation and anxiety. 10 tablet 0   meloxicam  (MOBIC ) 15 MG tablet Take 1 tablet (15 mg total) by mouth daily. 30 tablet 3   ondansetron  (ZOFRAN -ODT) 4 MG disintegrating tablet Take 1 tablet (4 mg total) by mouth every 8 (eight) hours as needed for nausea or vomiting. 30 tablet 0   pantoprazole  (PROTONIX ) 40 MG tablet Take 1 tablet (40 mg total) by mouth daily. 30 tablet 3   propranolol  (INDERAL ) 10 MG tablet Take 1 tablet (10 mg total) by mouth daily. 30 tablet 0   traZODone  (DESYREL ) 100 MG tablet Take 1 tablet (100 mg total) by mouth at bedtime. 30 tablet 1   triamcinolone  cream (KENALOG ) 0.1 % Apply 1 Application topically 2 (two) times daily. 80 g 1   valsartan -hydrochlorothiazide  (DIOVAN -HCT) 80-12.5 MG tablet Take 1 tablet by mouth daily. 90 tablet 0   methylPREDNISolone  (MEDROL  DOSEPAK) 4 MG TBPK tablet Take as directed (Patient not taking: Reported on 12/12/2023) 21 tablet 0   No current facility-administered medications on file prior to visit.    Allergies  Allergen Reactions   Penicillins Swelling    Has patient had a PCN reaction causing immediate rash,  facial/tongue/throat swelling, SOB or lightheadedness with hypotension: unknown Has patient had a PCN reaction causing severe rash involving mucus membranes or skin necrosis: unknown Has patient had a PCN reaction that required hospitalization unknown  Has patient had a PCN reaction occurring within the last 10 years: no If all of the above answers are NO, then may proceed with Cephalosporin use.    Septra [Bactrim] Itching and Swelling   Aspirin  Other (See Comments)    chilhood allergy    Banana Hives   Latex Hives   Shellfish Allergy  Other (See Comments)    No reaction tested positive   Strawberry Extract Hives   Tape Other (See Comments)    Skin peels away if paper tape is left on for long period of time      PE Today's Vitals   12/12/23 0949  BP: (!) 160/84  Pulse: 75  Temp: 97.7 F (36.5 C)  TempSrc: Oral  SpO2: 99%  Weight: 223 lb (101.2 kg)   Body mass index is 34.93 kg/m.  Physical Exam Vitals reviewed. Exam conducted with a chaperone present.  Constitutional:      General: She is not in acute distress.    Appearance: Normal appearance.  HENT:     Head: Normocephalic and atraumatic.     Nose: Nose normal.  Eyes:     Extraocular Movements: Extraocular movements intact.     Conjunctiva/sclera: Conjunctivae normal.  Pulmonary:     Effort: Pulmonary effort is normal.  Abdominal:     General: There is no distension.     Palpations: Abdomen is soft.     Tenderness: There is no abdominal tenderness.  Genitourinary:    General: Normal vulva.     Exam position: Lithotomy position.     Vagina: Normal. No vaginal discharge.     Cervix: Normal. No cervical motion tenderness, discharge or lesion.     Uterus: Normal. Not enlarged and not tender.      Adnexa: Right adnexa normal and left adnexa normal.     Comments: IUD strings present. Musculoskeletal:        General: Normal range of motion.     Cervical back: Normal range of motion.  Neurological:      General: No focal deficit present.     Mental Status: She is alert.  Psychiatric:        Mood and Affect: Mood normal.        Behavior: Behavior normal.   Declined string check today   Assessment and Plan:        Abnormal uterine bleeding (AUB) Intramural leiomyoma of uterus Uses hormone releasing intrauterine device (IUD) for contraception Normal string check  Vaginal itching -     WET PREP FOR TRICH, YEAST, CLUE Recommend topical hydrocortisone for 2 weeks  Herpes simplex infection -     valACYclovir  HCl; Take 1 tablet (500 mg total) by mouth 2 (two) times daily for 3 days. With outbreaks  Dispense: 30 tablet; Refill: 3  BV (bacterial vaginosis) 2nd infection this year -     metroNIDAZOLE ; Take 1 tablet (500 mg total) by mouth 2 (two) times daily for 7 days.  Dispense: 14 tablet; Refill: 0   RTO for annual exam  Vera LULLA Pa, MD

## 2023-12-13 ENCOUNTER — Other Ambulatory Visit (HOSPITAL_COMMUNITY): Payer: Self-pay

## 2023-12-13 ENCOUNTER — Encounter: Payer: Self-pay | Admitting: Orthopedic Surgery

## 2023-12-13 ENCOUNTER — Other Ambulatory Visit: Payer: Self-pay

## 2023-12-16 ENCOUNTER — Encounter: Payer: Self-pay | Admitting: Pulmonary Disease

## 2023-12-16 ENCOUNTER — Ambulatory Visit (INDEPENDENT_AMBULATORY_CARE_PROVIDER_SITE_OTHER): Payer: MEDICAID | Admitting: Pulmonary Disease

## 2023-12-16 VITALS — BP 145/86 | HR 73 | Ht 67.5 in | Wt 224.6 lb

## 2023-12-16 DIAGNOSIS — G4730 Sleep apnea, unspecified: Secondary | ICD-10-CM | POA: Diagnosis not present

## 2023-12-16 NOTE — Telephone Encounter (Signed)
Called and left voicemail for them to call back.

## 2023-12-16 NOTE — Patient Instructions (Signed)
 With your past history of sleep apnea, we will order an in-lab study to assess severity of sleep apnea  We will update you with results as soon as reviewed  Continue weight loss efforts  Continue to work on quitting smoking  Behavioral measures to help sleep quality should be put in place  Continue medications to help you get more hours of sleep  Follow-up in about 3 months  Myofunctional therapy-tongue exercises does help
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
## 2023-12-17 ENCOUNTER — Ambulatory Visit: Payer: MEDICAID | Admitting: Allergy & Immunology

## 2023-12-24 ENCOUNTER — Ambulatory Visit (INDEPENDENT_AMBULATORY_CARE_PROVIDER_SITE_OTHER): Payer: MEDICAID | Admitting: Orthopedic Surgery

## 2023-12-24 DIAGNOSIS — G5603 Carpal tunnel syndrome, bilateral upper limbs: Secondary | ICD-10-CM | POA: Diagnosis not present
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
## 2023-12-25 ENCOUNTER — Telehealth: Payer: Self-pay | Admitting: Orthopedic Surgery

## 2023-12-25 NOTE — Telephone Encounter (Signed)
 Kristie from [facility] called wanting to do a peer to peer to get the MRI approved by insurance. Insurance company number is [phone]. Tracking Number is [id number]. The insurance company wants to know if she's had 6 weeks of PT

## 2023-12-25 NOTE — Telephone Encounter (Signed)
 Please send her to therapy 1 time a week for 6 weeks and then have her follow-up or just call so we can document that it has failed and then we will get the MRI scan approved.  Thanks and when you talk to her tell her that this is insurance decision and not my decision.  Thanks

## 2023-12-27 ENCOUNTER — Other Ambulatory Visit: Payer: Self-pay

## 2023-12-27 DIAGNOSIS — M79605 Pain in left leg: Secondary | ICD-10-CM

## 2023-12-28 ENCOUNTER — Other Ambulatory Visit: Payer: MEDICAID

## 2023-12-30 ENCOUNTER — Ambulatory Visit: Payer: MEDICAID | Admitting: Orthopedic Surgery

## 2023-12-30 ENCOUNTER — Ambulatory Visit: Payer: MEDICAID | Admitting: Gastroenterology

## 2023-12-31 ENCOUNTER — Ambulatory Visit: Payer: MEDICAID | Admitting: Podiatry

## 2024-01-01 NOTE — Telephone Encounter (Signed)
 Called number on file x3, call could not be completed.   MyChart message sent.

## 2024-01-02 ENCOUNTER — Other Ambulatory Visit: Payer: Self-pay

## 2024-01-02 ENCOUNTER — Other Ambulatory Visit (HOSPITAL_COMMUNITY): Payer: Self-pay

## 2024-01-02 DIAGNOSIS — G5603 Carpal tunnel syndrome, bilateral upper limbs: Secondary | ICD-10-CM

## 2024-01-03 ENCOUNTER — Other Ambulatory Visit: Payer: Self-pay

## 2024-01-03 ENCOUNTER — Encounter (HOSPITAL_BASED_OUTPATIENT_CLINIC_OR_DEPARTMENT_OTHER): Payer: Self-pay | Admitting: Orthopedic Surgery

## 2024-01-06 ENCOUNTER — Encounter: Payer: Self-pay | Admitting: Radiology

## 2024-01-08 ENCOUNTER — Other Ambulatory Visit: Payer: Self-pay

## 2024-01-08 ENCOUNTER — Other Ambulatory Visit (HOSPITAL_COMMUNITY): Payer: Self-pay | Admitting: Physician Assistant

## 2024-01-08 ENCOUNTER — Other Ambulatory Visit (HOSPITAL_COMMUNITY): Payer: Self-pay

## 2024-01-08 ENCOUNTER — Encounter (HOSPITAL_BASED_OUTPATIENT_CLINIC_OR_DEPARTMENT_OTHER)
Admission: RE | Admit: 2024-01-08 | Discharge: 2024-01-08 | Disposition: A | Discharge status: Home / Self Care | Payer: MEDICAID | Source: Ambulatory Visit | Attending: Orthopedic Surgery | Admitting: Orthopedic Surgery

## 2024-01-08 ENCOUNTER — Other Ambulatory Visit: Payer: Self-pay | Admitting: Nurse Practitioner

## 2024-01-08 ENCOUNTER — Other Ambulatory Visit: Payer: Self-pay | Admitting: Family Medicine

## 2024-01-08 DIAGNOSIS — G4730 Sleep apnea, unspecified: Secondary | ICD-10-CM | POA: Diagnosis not present

## 2024-01-08 DIAGNOSIS — Z79899 Other long term (current) drug therapy: Secondary | ICD-10-CM | POA: Diagnosis not present

## 2024-01-08 DIAGNOSIS — F5105 Insomnia due to other mental disorder: Secondary | ICD-10-CM

## 2024-01-08 DIAGNOSIS — R0981 Nasal congestion: Secondary | ICD-10-CM

## 2024-01-08 DIAGNOSIS — F419 Anxiety disorder, unspecified: Secondary | ICD-10-CM | POA: Insufficient documentation

## 2024-01-08 DIAGNOSIS — I10 Essential (primary) hypertension: Secondary | ICD-10-CM | POA: Diagnosis not present

## 2024-01-08 DIAGNOSIS — D573 Sickle-cell trait: Secondary | ICD-10-CM | POA: Diagnosis not present

## 2024-01-08 DIAGNOSIS — F319 Bipolar disorder, unspecified: Secondary | ICD-10-CM | POA: Insufficient documentation

## 2024-01-08 DIAGNOSIS — Z01812 Encounter for preprocedural laboratory examination: Secondary | ICD-10-CM | POA: Insufficient documentation

## 2024-01-08 DIAGNOSIS — K219 Gastro-esophageal reflux disease without esophagitis: Secondary | ICD-10-CM

## 2024-01-08 DIAGNOSIS — G5602 Carpal tunnel syndrome, left upper limb: Secondary | ICD-10-CM | POA: Insufficient documentation

## 2024-01-08 LAB — BASIC METABOLIC PANEL WITH GFR
Anion gap: 12 (ref 5–15)
BUN: 14 mg/dL (ref 6–20)
CO2: 22 mmol/L (ref 22–32)
Calcium: 9.2 mg/dL (ref 8.9–10.3)
Chloride: 107 mmol/L (ref 98–111)
Creatinine, Ser: 0.88 mg/dL (ref 0.44–1.00)
GFR, Estimated: >60 mL/min (ref >60)
Glucose, Bld: 113 mg/dL — ABNORMAL HIGH (ref 70–99)
Potassium: 4 mmol/L (ref 3.5–5.1)
Sodium: 141 mmol/L (ref 135–145)

## 2024-01-08 MED ORDER — ATORVASTATIN CALCIUM 20 MG PO TABS
20.0000 mg | ORAL_TABLET | Freq: Every day | ORAL | 0 refills | Status: AC
  Filled 2024-01-08: qty 30, 30d supply, fill #0

## 2024-01-08 NOTE — Progress Notes (Signed)
 Ensure presurgery drink with written/verbal instruction to drink by 0700 DOS. Pt verbalized understanding.        Enhanced Recovery after Surgery for Orthopedics Enhanced Recovery after Surgery is a protocol used to improve the stress on your body and your recovery after surgery.  Patient Instructions  The night before surgery:  No food after midnight. ONLY clear liquids after midnight  The day of surgery (if you do NOT have diabetes):  Drink ONE (1) Pre-Surgery Clear Ensure as directed.   This drink was given to you during your hospital  pre-op appointment visit. The pre-op nurse will instruct you on the time to drink the  Pre-Surgery Ensure depending on your surgery time. Finish the drink at the designated time by the pre-op nurse.  Nothing else to drink after completing the  Pre-Surgery Clear Ensure.  The day of surgery (if you have diabetes): Drink ONE (1) Gatorade 2 (G2) as directed. This drink was given to you during your hospital  pre-op appointment visit.  The pre-op nurse will instruct you on the time to drink the   Gatorade 2 (G2) depending on your surgery time. Color of the Gatorade may vary. Red is not allowed. Nothing else to drink after completing the  Gatorade 2 (G2).         If you have questions, please contact your surgeon's office.

## 2024-01-09 ENCOUNTER — Ambulatory Visit: Payer: MEDICAID | Admitting: Physical Therapy

## 2024-01-09 ENCOUNTER — Other Ambulatory Visit (HOSPITAL_COMMUNITY): Payer: Self-pay

## 2024-01-09 ENCOUNTER — Other Ambulatory Visit: Payer: Self-pay

## 2024-01-09 MED ORDER — LEVOCETIRIZINE DIHYDROCHLORIDE 5 MG PO TABS
5.0000 mg | ORAL_TABLET | Freq: Every evening | ORAL | 1 refills | Status: AC
  Filled 2024-01-09: qty 30, 30d supply, fill #0

## 2024-01-09 MED ORDER — PANTOPRAZOLE SODIUM 40 MG PO TBEC
40.0000 mg | DELAYED_RELEASE_TABLET | Freq: Every day | ORAL | 3 refills | Status: AC
  Filled 2024-01-09: qty 30, 30d supply, fill #0

## 2024-01-09 NOTE — Discharge Instructions (Signed)
    Hand Surgery Postop Instructions    Dressings: Maintain postoperative dressing for 5 days.   After 5 days, it is okay to unwrap postoperative dressing and apply Band-Aid or rewrap.   Keep operative site clean and dry until orthopedic follow-up.  Wound Care: Keep your hand elevated above the level of your heart.  Do not allow it to dangle by your side. Moving your fingers is advised to stimulate circulation but will depend on the site of your surgery.  If you have a splint applied, your doctor will advise you regarding movement.  Activity: Do not drive or operate machinery until clearance given from physician. No heavy lifting with operative extremity.  Diet:  Drink liquids today or eat a light diet.  You may resume a regular diet tomorrow.    General expectations: Pain for two to three days. Take prescribed medication if given, transition to over-the-counter medication as quickly as possible. Fingers may become slightly swollen.  Call your doctor if any of the following occur: Severe pain not relieved by pain medication. Elevated temperature. Dressing soaked with blood. Inability to move fingers. White or bluish color to fingers.   Per [facility] clinic policy, our goal is ensure optimal postoperative pain control with a multimodal pain management strategy. For all [facility] patients, our goal is to wean post-operative narcotic medications by 6 weeks post-operatively. If this is not possible due to utilization of pain medication prior to surgery, your [facility] doctor will support your acute post-operative pain control for the first 6 weeks postoperatively, with a plan to transition you back to your primary pain team following that. Maralee will work to ensure a therapist, occupational.  Anshul Afton Alderton, M.D. Hand Surgery [facility]    Post Anesthesia Home Care Instructions  Activity: Get plenty of rest for the remainder of the day. A responsible individual  must stay with you for 24 hours following the procedure.  For the next 24 hours, DO NOT: -Drive a car -Advertising copywriter -Drink alcoholic beverages -Take any medication unless instructed by your physician -Make any legal decisions or sign important papers.  Meals: Start with liquid foods such as gelatin or soup. Progress to regular foods as tolerated. Avoid greasy, spicy, heavy foods. If nausea and/or vomiting occur, drink only clear liquids until the nausea and/or vomiting subsides. Call your physician if vomiting continues.  Special Instructions/Symptoms: Your throat may feel dry or sore from the anesthesia or the breathing tube placed in your throat during surgery. If this causes discomfort, gargle with warm salt water. The discomfort should disappear within 24 hours.  If you had a scopolamine  patch placed behind your ear for the management of post- operative nausea and/or vomiting:  1. The medication in the patch is effective for 72 hours, after which it should be removed.  Wrap patch in a tissue and discard in the trash. Wash hands thoroughly with soap and water. 2. You may remove the patch earlier than 72 hours if you experience unpleasant side effects which may include dry mouth, dizziness or visual disturbances. 3. Avoid touching the patch. Wash your hands with soap and water after contact with the patch.     May have Tylenol  today at 3:15pm

## 2024-01-09 NOTE — Therapy (Incomplete)
 OUTPATIENT OCCUPATIONAL THERAPY ORTHO EVALUATION  Patient Name: [name] MRN: [id number] DOB:1970-05-02, 53 y.o., female Today's Date: 01/09/2024  PCP: PIERRETTE REFERRING PROVIDER: Arlinda Buster, MD  END OF SESSION:   Past Medical History:  Diagnosis Date   Alcoholism (HCC)    Allergy     seasonal   Anxiety    Arthritis    Asthma    Bed wetting    Blood transfusion    1989 at [facility]   Bronchitis    Chronic bipolar disorder (HCC)    Chronic kidney disease    Depression    bipolar   Eczema    GERD (gastroesophageal reflux disease)    Headache(784.0)    occasional   Hypertension    Mental disorder    bipolar   MRSA infection 2011   left lower leg   Pancreatitis    Pyelonephritis 10/2010   Recurrent upper respiratory infection (URI)    states she has not been to MD and feels like she has  bronchitis now- greenish sputum   Shortness of breath    sometimes with exertion   Sickle cell trait    Sleep apnea    CPAP   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   Urticaria    UTI (lower urinary tract infection) 10/2010   Past Surgical History:  Procedure Laterality Date   CESAREAN SECTION     COLONOSCOPY     DILATATION & CURRETTAGE/HYSTEROSCOPY WITH RESECTOCOPE  10/29/2023   Procedure: DILATATION & CURETTAGE/HYSTEROSCOPY WITH MYOSURE;  Surgeon: Dallie Vera GAILS, MD;  Location: [facility] OR;  Service: Gynecology;;   HYSTEROSCOPY N/A 10/29/2023   Procedure: ATTEMPTED ABLATION, ENDOMETRIUM,;  Surgeon: Dallie Vera GAILS, MD;  Location: [facility] OR;  Service: Gynecology;  Laterality: N/A;  NOVASURE   INTRAUTERINE DEVICE (IUD) INSERTION N/A 10/29/2023   Procedure: INSERTION, INTRAUTERINE DEVICE;  Surgeon: Dallie Vera GAILS, MD;  Location: [facility] OR;  Service: Gynecology;  Laterality: N/A;   KNEE ARTHROSCOPY  06/15/2011   Procedure: ARTHROSCOPY KNEE;  Surgeon: Ozell VEAR Bruch, MD;  Location: [facility] OR;  Service: Orthopedics;  Laterality: Left;  LEFT KNEE SCOPE WITH LYSIS OF ADHESIONS  AND MANIPULATION   KNEE ARTHROSCOPY WITH MEDIAL MENISECTOMY Left 03/27/2018   Procedure: LEFT KNEE ARTHROSCOPY WITH PARTIAL MEDIAL MENISCECTOMY;  Surgeon: Vernetta Lonni GRADE, MD;  Location: [facility];  Service: Orthopedics;  Laterality: Left;   ORIF TIBIA PLATEAU  03/08/2011   Procedure: OPEN REDUCTION INTERNAL FIXATION (ORIF) TIBIAL PLATEAU;  Surgeon: Ozell VEAR Bruch;  Location: [facility] OR;  Service: Orthopedics;  Laterality: Left;   TUBAL LIGATION     UPPER GASTROINTESTINAL ENDOSCOPY     Patient Active Problem List   Diagnosis Date Noted   Uses hormone releasing intrauterine device (IUD) for contraception 11/12/2023   Intramural leiomyoma of uterus 07/01/2023   Rash 06/19/2023   Urinary incontinence 06/19/2023   Preop cardiovascular exam 06/19/2023   Iron  deficiency anemia due to chronic blood loss 06/18/2023   Moderate episode of recurrent major depressive disorder (HCC) 05/29/2023   Obstructive sleep apnea 05/22/2023   Nasal congestion 05/22/2023   Encounter for screening for malignant neoplasm of breast 05/22/2023   Dysphagia 05/22/2023   Abnormal uterine bleeding (AUB) 04/15/2023   Generalized anxiety disorder 11/24/2020   Insomnia due to other mental disorder 11/24/2020   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   PTSD (post-traumatic stress disorder) 11/22/2020   Bipolar I disorder, most recent episode depressed (HCC) 11/22/2020   Essential hypertension,  benign 08/28/2018   Seasonal allergies 08/28/2018   Herpes simplex infection 04/07/2018   Other tear of medial meniscus, current injury, left knee, subsequent encounter 03/27/2018   Fracture of tibial plateau, closed 03/08/2011   GERD (gastroesophageal reflux disease) 03/08/2011   Bipolar disorder (HCC) 03/08/2011   OSA (obstructive sleep apnea) 03/08/2011   Obesity 03/08/2011   Nicotine  dependence 03/08/2011    ONSET DATE: 01/02/2024 (referral date)   REFERRING DIAG: G56.03 (ICD-10-CM) -  Bilateral carpal tunnel syndrome  Note:  NO SPLINT  Needs OT 5-6 days s/p L endoscopic CTR 01/10/24  THERAPY DIAG:  No diagnosis found.  Rationale for Evaluation and Treatment: Rehabilitation  SUBJECTIVE:   SUBJECTIVE STATEMENT: *** Pt accompanied by: {accompnied:27141}  PERTINENT HISTORY: ***  PRECAUTIONS: {Therapy precautions:24002}  RED FLAGS: {PT Red Flags:29287}   WEIGHT BEARING RESTRICTIONS: {Yes ***/No:24003}  PAIN:  Are you having pain? {OPRCPAIN:27236}  FALLS: Has patient fallen in last 6 months? {fallsyesno:27318}  LIVING ENVIRONMENT: Lives with: {OPRC lives with:25569::lives with their family} Lives in: {Lives in:25570} Stairs: {opstairs:27293} Has following equipment at home: {Assistive devices:23999}  PLOF: {PLOF:24004}  PATIENT GOALS: ***  NEXT MD VISIT: ***  OBJECTIVE:  Note: Objective measures were completed at Evaluation unless otherwise noted.  HAND DOMINANCE: {MISC; OT HAND [phone]}  ADLs: {ADLs OT:31716}  FUNCTIONAL OUTCOME MEASURES: {OTFUNCTIONALMEASURES:27238}  UPPER EXTREMITY ROM:     {AROM/PROM:27142} ROM Right eval Left eval  Shoulder flexion    Shoulder abduction    Shoulder adduction    Shoulder extension    Shoulder internal rotation    Shoulder external rotation    Elbow flexion    Elbow extension    Wrist flexion    Wrist extension    Wrist ulnar deviation    Wrist radial deviation    Wrist pronation    Wrist supination    (Blank rows = not tested)  {AROM/PROM:27142} ROM Right eval Left eval  Thumb MCP (0-60)    Thumb IP (0-80)    Thumb Radial abd/add (0-55)     Thumb Palmar abd/add (0-45)     Thumb Opposition to Small Finger     Index MCP (0-90)     Index PIP (0-100)     Index DIP (0-70)      Long MCP (0-90)      Long PIP (0-100)      Long DIP (0-70)      Ring MCP (0-90)      Ring PIP (0-100)      Ring DIP (0-70)      Little MCP (0-90)      Little PIP (0-100)      Little DIP  (0-70)      (Blank rows = not tested)   UPPER EXTREMITY MMT:     MMT Right eval Left eval  Shoulder flexion    Shoulder abduction    Shoulder adduction    Shoulder extension    Shoulder internal rotation    Shoulder external rotation    Middle trapezius    Lower trapezius    Elbow flexion    Elbow extension    Wrist flexion    Wrist extension    Wrist ulnar deviation    Wrist radial deviation    Wrist pronation    Wrist supination    (Blank rows = not tested)  HAND FUNCTION: {handfunction:27230}  COORDINATION: {otcoordination:27237}  SENSATION: {sensation:27233}  EDEMA: ***  COGNITION: Overall cognitive status: {cognition:24006} Areas of impairment: {impairedcognition:27234}  OBSERVATIONS: ***   TREATMENT  DATE: ***                                                                                                                            Modalities: {OPRCMODALITIES:31717}     PATIENT EDUCATION: Education details: *** Person educated: {Person educated:25204} Education method: {Education Method:25205} Education comprehension: {Education Comprehension:25206}  HOME EXERCISE PROGRAM: ***  GOALS: Goals reviewed with patient? {yes/no:20286}  SHORT TERM GOALS: Target date: ***  *** Baseline: Goal status: INITIAL  2.  *** Baseline:  Goal status: INITIAL  3.  *** Baseline:  Goal status: INITIAL  4.  *** Baseline:  Goal status: INITIAL  5.  *** Baseline:  Goal status: INITIAL  6.  *** Baseline:  Goal status: INITIAL  LONG TERM GOALS: Target date: ***  *** Baseline:  Goal status: INITIAL  2.  *** Baseline:  Goal status: INITIAL  3.  *** Baseline:  Goal status: INITIAL  4.  *** Baseline:  Goal status: INITIAL  5.  *** Baseline:  Goal status: INITIAL  6.  *** Baseline:  Goal status: INITIAL  ASSESSMENT:  CLINICAL IMPRESSION: Patient is a *** y.o. *** who was seen today for occupational therapy evaluation for ***.    PERFORMANCE DEFICITS: in functional skills including {OT physical skills:25468}, cognitive skills including {OT cognitive skills:25469}, and psychosocial skills including {OT psychosocial skills:25470}.   IMPAIRMENTS: are limiting patient from {OT performance deficits:25471}.   COMORBIDITIES: {Comorbidities:25485} that affects occupational performance. Patient will benefit from skilled OT to address above impairments and improve overall function.  MODIFICATION OR ASSISTANCE TO COMPLETE EVALUATION: {OT modification:25474}  OT OCCUPATIONAL PROFILE AND HISTORY: {OT PROFILE AND HISTORY:25484}  CLINICAL DECISION MAKING: {OT CDM:25475}  REHAB POTENTIAL: {rehabpotential:25112}  EVALUATION COMPLEXITY: {Evaluation complexity:25115}      PLAN:  OT FREQUENCY: {rehab frequency:25116}  OT DURATION: {rehab duration:25117}  PLANNED INTERVENTIONS: {OT Interventions:25467}  RECOMMENDED OTHER SERVICES: ***  CONSULTED AND AGREED WITH PLAN OF CARE: {ENR:74513}  PLAN FOR NEXT SESSION: ***   Burnard JINNY Roads, OT 01/09/2024, 9:01 AM

## 2024-01-10 ENCOUNTER — Ambulatory Visit (HOSPITAL_BASED_OUTPATIENT_CLINIC_OR_DEPARTMENT_OTHER): Payer: MEDICAID | Admitting: Anesthesiology

## 2024-01-10 ENCOUNTER — Other Ambulatory Visit (HOSPITAL_BASED_OUTPATIENT_CLINIC_OR_DEPARTMENT_OTHER): Payer: Self-pay

## 2024-01-10 ENCOUNTER — Encounter (HOSPITAL_BASED_OUTPATIENT_CLINIC_OR_DEPARTMENT_OTHER): Payer: Self-pay | Admitting: Orthopedic Surgery

## 2024-01-10 ENCOUNTER — Other Ambulatory Visit (HOSPITAL_COMMUNITY): Payer: Self-pay

## 2024-01-10 ENCOUNTER — Ambulatory Visit (HOSPITAL_BASED_OUTPATIENT_CLINIC_OR_DEPARTMENT_OTHER)
Admission: RE | Admit: 2024-01-10 | Discharge: 2024-01-10 | Disposition: A | Discharge status: Home / Self Care | Payer: MEDICAID | Attending: Orthopedic Surgery | Admitting: Orthopedic Surgery

## 2024-01-10 ENCOUNTER — Encounter: Payer: Self-pay | Admitting: Pharmacist

## 2024-01-10 ENCOUNTER — Encounter (HOSPITAL_BASED_OUTPATIENT_CLINIC_OR_DEPARTMENT_OTHER): Admission: RE | Disposition: A | Payer: Self-pay | Source: Home / Self Care | Attending: Orthopedic Surgery

## 2024-01-10 ENCOUNTER — Other Ambulatory Visit: Payer: Self-pay

## 2024-01-10 ENCOUNTER — Encounter: Payer: Self-pay | Admitting: Orthopedic Surgery

## 2024-01-10 DIAGNOSIS — G4733 Obstructive sleep apnea (adult) (pediatric): Secondary | ICD-10-CM | POA: Diagnosis not present

## 2024-01-10 DIAGNOSIS — I10 Essential (primary) hypertension: Secondary | ICD-10-CM | POA: Diagnosis not present

## 2024-01-10 DIAGNOSIS — G5602 Carpal tunnel syndrome, left upper limb: Secondary | ICD-10-CM | POA: Diagnosis not present

## 2024-01-10 DIAGNOSIS — F17210 Nicotine dependence, cigarettes, uncomplicated: Secondary | ICD-10-CM

## 2024-01-10 DIAGNOSIS — Z01818 Encounter for other preprocedural examination: Secondary | ICD-10-CM

## 2024-01-10 DIAGNOSIS — Z79899 Other long term (current) drug therapy: Secondary | ICD-10-CM

## 2024-01-10 HISTORY — PX: CARPAL TUNNEL RELEASE: SHX101

## 2024-01-10 LAB — POCT PREGNANCY, URINE: Preg Test, Ur: NEGATIVE

## 2024-01-10 SURGERY — RELEASE, CARPAL TUNNEL, ENDOSCOPIC
Anesthesia: General | Site: Wrist | Laterality: Left

## 2024-01-10 MED ORDER — LIDOCAINE HCL (CARDIAC) PF 100 MG/5ML IV SOSY
PREFILLED_SYRINGE | INTRAVENOUS | Status: DC | PRN
  Administered 2024-01-10: 50 mg via INTRAVENOUS
  Administered 2024-01-10: 20 mg via INTRAVENOUS

## 2024-01-10 MED ORDER — MIDAZOLAM HCL 5 MG/5ML IJ SOLN
INTRAMUSCULAR | Status: DC | PRN
  Administered 2024-01-10 (×2): 1 mg via INTRAVENOUS

## 2024-01-10 MED ORDER — DEXAMETHASONE SOD PHOSPHATE PF 10 MG/ML IJ SOLN
INTRAMUSCULAR | Status: DC | PRN
  Administered 2024-01-10: 8 mg via INTRAVENOUS

## 2024-01-10 MED ORDER — OXYCODONE HCL 5 MG/5ML PO SOLN: 5.0000 mg | Freq: Once | ORAL | Status: AC | PRN

## 2024-01-10 MED ORDER — ONDANSETRON HCL 4 MG/2ML IJ SOLN
INTRAMUSCULAR | Status: DC | PRN
  Administered 2024-01-10: 4 mg via INTRAVENOUS

## 2024-01-10 MED ORDER — LIDOCAINE-EPINEPHRINE (PF) 1 %-1:200000 IJ SOLN
INTRAMUSCULAR | Status: DC | PRN
  Administered 2024-01-10: 10 mL

## 2024-01-10 MED ORDER — OXYCODONE HCL 5 MG PO TABS
5.0000 mg | ORAL_TABLET | Freq: Four times a day (QID) | ORAL | 0 refills | Status: AC | PRN
  Filled 2024-01-10 – 2024-01-13 (×3): qty 20, 5d supply, fill #0

## 2024-01-10 MED ORDER — FENTANYL CITRATE (PF) 100 MCG/2ML IJ SOLN: 25.0000 ug | INTRAMUSCULAR | Status: DC | PRN

## 2024-01-10 MED ORDER — FENTANYL CITRATE (PF) 100 MCG/2ML IJ SOLN
INTRAMUSCULAR | Status: DC | PRN
  Administered 2024-01-10 (×3): 50 ug via INTRAVENOUS

## 2024-01-10 MED ORDER — LACTATED RINGERS IV SOLN
INTRAVENOUS | Status: DC
Start: 2024-01-10 — End: 2024-01-10

## 2024-01-10 MED ORDER — MEPERIDINE HCL 25 MG/ML IJ SOLN: 6.2500 mg | INTRAMUSCULAR | Status: DC | PRN

## 2024-01-10 MED ORDER — ACETAMINOPHEN 500 MG PO TABS
ORAL_TABLET | ORAL | Status: AC
  Filled 2024-01-10: qty 2

## 2024-01-10 MED ORDER — DEXMEDETOMIDINE HCL IN NACL 200 MCG/50ML IV SOLN
INTRAVENOUS | Status: DC | PRN
  Administered 2024-01-10: 8 ug via INTRAVENOUS

## 2024-01-10 MED ORDER — PROPRANOLOL HCL 10 MG PO TABS
10.0000 mg | ORAL_TABLET | Freq: Once | ORAL | Status: AC
  Administered 2024-01-10: 10 mg via ORAL
  Filled 2024-01-10 (×2): qty 1

## 2024-01-10 MED ORDER — MIDAZOLAM HCL (PF) 2 MG/2ML IJ SOLN: 0.5000 mg | Freq: Once | INTRAMUSCULAR | Status: DC | PRN

## 2024-01-10 MED ORDER — PROPOFOL 10 MG/ML IV BOLUS
INTRAVENOUS | Status: DC | PRN
  Administered 2024-01-10: 200 mg via INTRAVENOUS

## 2024-01-10 MED ORDER — ACETAMINOPHEN 500 MG PO TABS
1000.0000 mg | ORAL_TABLET | Freq: Once | ORAL | Status: AC
  Administered 2024-01-10: 1000 mg via ORAL

## 2024-01-10 MED ORDER — OXYCODONE HCL 5 MG PO TABS
5.0000 mg | ORAL_TABLET | Freq: Once | ORAL | Status: AC | PRN
  Administered 2024-01-10: 5 mg via ORAL

## 2024-01-10 MED ORDER — CEFAZOLIN SODIUM-DEXTROSE 2-4 GM/100ML-% IV SOLN
2.0000 g | INTRAVENOUS | Status: AC
  Administered 2024-01-10: 2 g via INTRAVENOUS

## 2024-01-10 MED ORDER — CEFAZOLIN SODIUM-DEXTROSE 2-4 GM/100ML-% IV SOLN
INTRAVENOUS | Status: AC
  Filled 2024-01-10: qty 100

## 2024-01-10 MED ORDER — 0.9 % SODIUM CHLORIDE (POUR BTL) OPTIME
TOPICAL | Status: DC | PRN
  Administered 2024-01-10: 60 mL

## 2024-01-10 SURGICAL SUPPLY — 31 items
APPLICATOR COTTON TIP 6 STRL (MISCELLANEOUS) ×1 IMPLANT
BLADE SURG 15 STRL LF DISP TIS (BLADE) ×2 IMPLANT
BNDG COHESIVE 4X5 TAN STRL LF (GAUZE/BANDAGES/DRESSINGS) ×1 IMPLANT
BNDG COMPR ESMARK 4X3 LF (GAUZE/BANDAGES/DRESSINGS) ×1 IMPLANT
CHLORAPREP W/TINT 26 (MISCELLANEOUS) ×1 IMPLANT
CORD BIPOLAR FORCEPS 12FT (ELECTRODE) ×1 IMPLANT
COVER BACK TABLE 60X90IN (DRAPES) ×1 IMPLANT
CUFF TOURN SGL QUICK 18X4 (TOURNIQUET CUFF) IMPLANT
DRAPE HAND 77X146 (DRAPES) ×1 IMPLANT
DRAPE SURG 17X23 STRL (DRAPES) ×1 IMPLANT
DRSG TEGADERM 2-3/8X2-3/4 SM (GAUZE/BANDAGES/DRESSINGS) ×1 IMPLANT
GAUZE SPONGE 4X4 12PLY STRL LF (GAUZE/BANDAGES/DRESSINGS) ×1 IMPLANT
GAUZE XEROFORM 1X8 LF (GAUZE/BANDAGES/DRESSINGS) ×1 IMPLANT
GLOVE BIO SURGEON STRL SZ7.5 (GLOVE) ×1 IMPLANT
GLOVE BIOGEL PI IND STRL 7.5 (GLOVE) ×1 IMPLANT
GOWN STRL REUS W/ TWL LRG LVL3 (GOWN DISPOSABLE) ×2 IMPLANT
GOWN STRL SURGICAL XL XLNG (GOWN DISPOSABLE) ×2 IMPLANT
KIT ENDO FORWARD CUT SAFEVIEW (SYSTAGENIX WOUND MANAGEMENT) ×1 IMPLANT
NDL HYPO 25X5/8 SAFETYGLIDE (NEEDLE) IMPLANT
NEEDLE HYPO 25X5/8 SAFETYGLIDE (NEEDLE) ×1 IMPLANT
PACK BASIN DAY SURGERY FS (CUSTOM PROCEDURE TRAY) ×1 IMPLANT
SHEET MEDIUM DRAPE 40X70 STRL (DRAPES) ×1 IMPLANT
SOLN 0.9% NACL POUR BTL 1000ML (IV SOLUTION) IMPLANT
SOLUTION ANTFG W/FOAM PAD STRL (MISCELLANEOUS) ×1 IMPLANT
SPIKE FLUID TRANSFER (MISCELLANEOUS) IMPLANT
STOCKINETTE IMPERVIOUS 9X36 MD (GAUZE/BANDAGES/DRESSINGS) ×1 IMPLANT
SUT ETHILON 4 0 PS 2 18 (SUTURE) ×1 IMPLANT
SYR BULB EAR ULCER 3OZ GRN STR (SYRINGE) ×2 IMPLANT
SYR CONTROL 10ML LL (SYRINGE) ×1 IMPLANT
TOWEL GREEN STERILE FF (TOWEL DISPOSABLE) ×2 IMPLANT
UNDERPAD 30X36 HEAVY ABSORB (UNDERPADS AND DIAPERS) ×1 IMPLANT

## 2024-01-10 NOTE — Anesthesia Postprocedure Evaluation (Signed)
 Anesthesia Post Note  Patient: [name]  Procedure(s) Performed: RELEASE, CARPAL TUNNEL, ENDOSCOPIC (Left: Wrist)     Patient location during evaluation: PACU Anesthesia Type: General Level of consciousness: awake and alert, oriented and patient cooperative Pain management: pain level controlled Vital Signs Assessment: post-procedure vital signs reviewed and stable Respiratory status: spontaneous breathing, nonlabored ventilation and respiratory function stable Cardiovascular status: blood pressure returned to baseline and stable Postop Assessment: no apparent nausea or vomiting and able to ambulate Anesthetic complications: no   No notable events documented.  Last Vitals:  Vitals:   01/10/24 1135 01/10/24 1145  BP: (!) 163/90 (!) 157/70  Pulse: (!) 59 63  Resp: 15 13  Temp:  36.6 C  SpO2: 98% 98%    Last Pain:  Vitals:   01/10/24 1145  TempSrc:   PainSc: 4                  Zena Vitelli,E. Shaniquia Brafford

## 2024-01-10 NOTE — Transfer of Care (Deleted)
 Immediate Anesthesia Transfer of Care Note  Patient: [name]  Procedure(s) Performed: RELEASE, CARPAL TUNNEL, ENDOSCOPIC (Left: Wrist)  Patient Location: PACU  Anesthesia Type:General  Level of Consciousness: awake, alert , oriented, and patient cooperative  Airway & Oxygen Therapy: Patient Spontanous Breathing and Patient connected to face mask oxygen  Post-op Assessment: Report given to RN and Post -op Vital signs reviewed and stable  Post vital signs: Reviewed and stable  Last Vitals:  Vitals Value Taken Time  BP 170/93 01/10/24 10:57  Temp    Pulse 72 01/10/24 10:58  Resp 21 01/10/24 10:58  SpO2 100 % 01/10/24 10:58  Vitals shown include unfiled device data.  Last Pain:  Vitals:   01/10/24 0915  TempSrc: Temporal  PainSc: 0-No pain         Complications: No notable events documented.

## 2024-01-10 NOTE — Interval H&P Note (Signed)
 History and Physical Interval Note:  01/10/2024 9:08 AM  [name]  has presented today for surgery, with the diagnosis of LEFT CARPAL TUNNEL SYNDROME.  The various methods of treatment have been discussed with the patient and family. After consideration of risks, benefits and other options for treatment, the patient has consented to  Procedure(s): RELEASE, CARPAL TUNNEL, ENDOSCOPIC (Left) as a surgical intervention.  The patient's history has been reviewed, patient examined, no change in status, stable for surgery.  I have reviewed the patient's chart and labs.  Questions were answered to the patient's satisfaction.     [name]

## 2024-01-10 NOTE — Anesthesia Preprocedure Evaluation (Addendum)
 Anesthesia Evaluation  Patient identified by MRN, date of birth, ID band Patient awake    Reviewed: Allergy  & Precautions, NPO status , Patient's Chart, lab work & pertinent test results, reviewed documented beta blocker date and time   History of Anesthesia Complications Negative for: history of anesthetic complications  Airway Mallampati: II  TM Distance: >3 FB Neck ROM: Full    Dental  (+) Dental Advisory Given   Pulmonary sleep apnea (does not use CPAP) , Current SmokerPatient did not abstain from smoking.   breath sounds clear to auscultation       Cardiovascular hypertension, Pt. on medications and Pt. on home beta blockers (-) angina  Rhythm:Regular Rate:Normal     Neuro/Psych   Anxiety Depression Bipolar Disorder      GI/Hepatic ,GERD  Medicated and Controlled,,(+)     substance abuse (h/o ETOH)    Endo/Other  BMI 35  Renal/GU Renal InsufficiencyRenal disease     Musculoskeletal  (+) Arthritis ,    Abdominal   Peds  Hematology  (+) Blood dyscrasia, Sickle cell trait   Anesthesia Other Findings   Reproductive/Obstetrics                              Anesthesia Physical Anesthesia Plan  ASA: 3  Anesthesia Plan: General   Post-op Pain Management: Tylenol  PO (pre-op)*   Induction: Intravenous  PONV Risk Score and Plan: 2 and Ondansetron  and Dexamethasone   Airway Management Planned: LMA  Additional Equipment: None  Intra-op Plan:   Post-operative Plan:   Informed Consent: I have reviewed the patients History and Physical, chart, labs and discussed the procedure including the risks, benefits and alternatives for the proposed anesthesia with the patient or authorized representative who has indicated his/her understanding and acceptance.     Dental advisory given  Plan Discussed with: CRNA and Surgeon  Anesthesia Plan Comments:          Anesthesia Quick  Evaluation

## 2024-01-10 NOTE — Transfer of Care (Signed)
 Immediate Anesthesia Transfer of Care Note  Patient: [name]  Procedure(s) Performed: RELEASE, CARPAL TUNNEL, ENDOSCOPIC (Left: Wrist)  Patient Location: PACU  Anesthesia Type:General  Level of Consciousness: awake, alert , oriented, and patient cooperative  Airway & Oxygen Therapy: Patient Spontanous Breathing and Patient connected to face mask oxygen  Post-op Assessment: Report given to RN and Post -op Vital signs reviewed and stable  Post vital signs: Reviewed and stable  Last Vitals:  Vitals Value Taken Time  BP 150/85 01/10/24 11:00  Temp 36.6 C 01/10/24 10:57  Pulse 71 01/10/24 11:02  Resp 15 01/10/24 11:02  SpO2 100 % 01/10/24 11:02  Vitals shown include unfiled device data.  Last Pain:  Vitals:   01/10/24 1057  TempSrc:   PainSc: 0-No pain         Complications: No notable events documented.

## 2024-01-10 NOTE — Anesthesia Procedure Notes (Signed)
 Procedure Name: LMA Insertion Date/Time: 01/10/2024 10:20 AM  Performed by: Kathern Rollene LABOR, CRNAPre-anesthesia Checklist: Patient identified, Emergency Drugs available, Suction available and Patient being monitored Patient Re-evaluated:Patient Re-evaluated prior to induction Oxygen Delivery Method: Circle system utilized Preoxygenation: Pre-oxygenation with 100% oxygen Induction Type: IV induction Ventilation: Mask ventilation without difficulty LMA: LMA inserted LMA Size: 4.0 Tube type: Oral Number of attempts: 1 Placement Confirmation: positive ETCO2 and breath sounds checked- equal and bilateral Tube secured with: Tape Dental Injury: Teeth and Oropharynx as per pre-operative assessment

## 2024-01-10 NOTE — Op Note (Signed)
 NAME: [name] MEDICAL RECORD NO: [id number] DATE OF BIRTH: 04-29-1970 FACILITY: [facility] LOCATION: [facility] PHYSICIAN: GILDARDO ALDERTON, MD   OPERATIVE REPORT   DATE OF PROCEDURE: 01/10/24    PREOPERATIVE DIAGNOSIS: Left carpal tunnel syndrome   POSTOPERATIVE DIAGNOSIS: Left carpal tunnel syndrome   PROCEDURE: Left endoscopic carpal tunnel release   SURGEON:  Gildardo Alderton, M.D.   ASSISTANT: Joesph Hooks, OPA   ANESTHESIA:  General   INTRAVENOUS FLUIDS:  Per anesthesia flow sheet.   ESTIMATED BLOOD LOSS:  Minimal.   COMPLICATIONS:  None.   SPECIMENS:  none   TOURNIQUET TIME:    Total Tourniquet Time Documented: Upper Arm (Left) - 10 minutes Total: Upper Arm (Left) - 10 minutes    DISPOSITION:  Stable to PACU.   INDICATIONS: 53 year old female who was seen in the outpatient setting and found to have clinical and electrodiagnostic evidence of left-sided carpal tunnel syndrome refractory to conservative care.  Patient was indicated for left open versus endoscopic carpal tunnel release.  After discussion, patient elected to have surgery in the form of left endoscopic carpal tunnel release.  Risks and benefits of surgery were discussed including the risks of infection, bleeding, scarring, stiffness, nerve injury, vascular injury, tendon injury, need for subsequent operation, persistent symptoms, recurrence.  She voiced understanding of these risks and elected to proceed.  OPERATIVE COURSE: Patient was seen and identified in the preoperative area and marked appropriately.  Surgical consent had been signed. Preoperative IV antibiotic prophylaxis was given. She was transferred to the operating room and placed in supine position with the Left upper extremity on an arm board.  General anesthesia was induced by the anesthesiologist.  Left upper extremity was prepped and draped in normal sterile orthopedic fashion.  A surgical pause was performed between the  surgeons, anesthesia, and operating room staff and all were in agreement as to the patient, procedure, and site of procedure.  Tourniquet was placed and padded appropriately to the left upper arm.  The arm was exsanguinated the tourniquet was inflated to 250 mmHg.  A 1.5 cm skin incision was designed transversely proximal to the wrist flexion crease.  This incision was carried down through the subcutaneous tissues and through the forearm fascia.  A synovial elevator was introduced to identify the carpal tunnel space.  Sequential dilators were then used to open the carpal tunnel.  The cannula from the SafeView system was introduced in antegrade fashion into the carpal tunnel, and a standard wrist endoscope was used to visualize the undersurface of the transverse carpal ligament.  A probe and a rasp were used to delineate the distal edge of the transverse carpal ligament and to clear the underlying synovial tissues.  At this juncture, a forward cutting blade was introduced in an antegrade fashion was used to divide the transverse carpal ligament in its entirety.  Care was taken to ensure complete division of the structure by probing the resultant defect.  The endoscopic instruments were then removed.  At this point in the procedure, the median nerve was identified at the wrist flexion crease.  The distal end of the forearm fascia was released using tenotomy scissors with particular care taken to avoid injury to the palmar cutaneous branch of the median nerve.  The median nerve appeared completely decompressed in both the palm and distal forearm.  The wound was copiously irrigated, the tourniquet was deflated and hemostasis was achieved with bipolar electrocautery.  Tourniquet time was 10 minutes.  The wound was  closed with 4-0 nylon suture in vertical mattress fashion.  Sterile dressings were applied.  Patient was subsequently awoken from anesthesia and transported to the postoperative unit in stable  condition.  Post-operative plan: The patient will recover in the post-anesthesia care unit and then be discharged home.  The patient will be non weight bearing on the left upper extremity in a soft hand dressing.   I will see the patient back in the office in 2 weeks for postoperative followup.  Discharge instructions were provided for appropriate dressing maintenance, wound care and pain control.  Jauan Wohl, MD Electronically signed, 01/10/24

## 2024-01-11 ENCOUNTER — Encounter (HOSPITAL_BASED_OUTPATIENT_CLINIC_OR_DEPARTMENT_OTHER): Payer: Self-pay | Admitting: Orthopedic Surgery

## 2024-01-13 ENCOUNTER — Other Ambulatory Visit: Payer: Self-pay

## 2024-01-13 ENCOUNTER — Other Ambulatory Visit (HOSPITAL_COMMUNITY): Payer: Self-pay

## 2024-01-14 ENCOUNTER — Other Ambulatory Visit (HOSPITAL_COMMUNITY): Payer: Self-pay

## 2024-01-14 ENCOUNTER — Other Ambulatory Visit: Payer: Self-pay | Admitting: Orthopedic Surgery

## 2024-01-14 MED ORDER — ONDANSETRON 4 MG PO TBDP
4.0000 mg | ORAL_TABLET | Freq: Three times a day (TID) | ORAL | 0 refills | Status: AC | PRN
  Filled 2024-01-14: qty 20, 7d supply, fill #0

## 2024-01-15 ENCOUNTER — Ambulatory Visit: Payer: MEDICAID | Admitting: Occupational Therapy

## 2024-01-15 ENCOUNTER — Other Ambulatory Visit (HOSPITAL_COMMUNITY): Payer: Self-pay

## 2024-01-15 ENCOUNTER — Ambulatory Visit: Payer: MEDICAID | Admitting: Pulmonary Disease

## 2024-01-15 ENCOUNTER — Other Ambulatory Visit (HOSPITAL_COMMUNITY): Payer: MEDICAID

## 2024-01-16 ENCOUNTER — Ambulatory Visit: Payer: MEDICAID | Admitting: Occupational Therapy

## 2024-01-20 ENCOUNTER — Other Ambulatory Visit (HOSPITAL_COMMUNITY): Payer: Self-pay

## 2024-01-20 ENCOUNTER — Encounter (HOSPITAL_COMMUNITY): Payer: Self-pay

## 2024-01-21 ENCOUNTER — Other Ambulatory Visit (HOSPITAL_COMMUNITY): Payer: Self-pay

## 2024-01-21 ENCOUNTER — Ambulatory Visit: Payer: MEDICAID | Admitting: Occupational Therapy

## 2024-01-22 NOTE — Progress Notes (Deleted)
   JAHAYRA MAZO - 53 y.o. female MRN [id number]  Date of birth: Jul 23, 1970  Office Visit Note: Visit Date: 01/23/2024 PCP: Elnor Lauraine BRAVO, NP Referred by: Elnor Lauraine BRAVO, NP  Subjective:  HPI: BAILEA BEED is a 53 y.o. female who presents today for follow up 2 weeks status post left wrist endoscopic carpal tunnel release.  Pertinent ROS were reviewed with the patient and found to be negative unless otherwise specified above in HPI.   Assessment & Plan: Visit Diagnoses: No diagnosis found.  Plan: ***  Follow-up: No follow-ups on file.   Meds & Orders: No orders of the defined types were placed in this encounter.  No orders of the defined types were placed in this encounter.    Procedures: No procedures performed       Objective:   Vital Signs: LMP 01/10/2024   Ortho Exam ***  Imaging: No results found.   Anshul Afton Alderton, M.D. [facility], Hand Surgery

## 2024-01-23 ENCOUNTER — Ambulatory Visit (HOSPITAL_BASED_OUTPATIENT_CLINIC_OR_DEPARTMENT_OTHER): Payer: MEDICAID | Attending: Pulmonary Disease | Admitting: Pulmonary Disease

## 2024-01-23 ENCOUNTER — Encounter: Payer: MEDICAID | Admitting: Orthopedic Surgery

## 2024-01-23 DIAGNOSIS — R0683 Snoring: Secondary | ICD-10-CM | POA: Insufficient documentation

## 2024-01-23 DIAGNOSIS — G4730 Sleep apnea, unspecified: Secondary | ICD-10-CM

## 2024-01-24 ENCOUNTER — Telehealth: Payer: Self-pay

## 2024-01-24 NOTE — Telephone Encounter (Signed)
 Patient would like to know if she can get her appt. R/S from yesterday.  Stated that she thought her appt.was for today, 01/24/24.  CB# [phone].  Please advise. Thank you.

## 2024-01-24 NOTE — Telephone Encounter (Signed)
Scheduled for 11/25.

## 2024-01-25 ENCOUNTER — Telehealth: Payer: Self-pay | Admitting: Pulmonary Disease

## 2024-01-25 DIAGNOSIS — R0683 Snoring: Secondary | ICD-10-CM

## 2024-01-25 NOTE — Telephone Encounter (Signed)
 Call patient  Sleep study result  Date of study: 01/23/2024  Impression: Negative study for significant sleep disordered breathing with an AHI of 4.1 No significant oxygen desaturations Moderate to severe snoring Snoring was associated with arousals contributing to fragmented sleep  Recommendation: Encourage weight loss measures  Behavioral modifications that may help reduce snoring may include elevation of the head of the bed, ensuring lateral position sleep may help  May consider an oral device for the management of snoring -This will entail referral to a dentist -Oral device for snoring not usually covered by insurance  Follow-up as previously scheduled

## 2024-01-25 NOTE — Procedures (Addendum)
  Indications for Polysomnography The patient is a 52 year-old Female who is 5' 7 and weighs 224.0 lbs. Her BMI equals 35.2.  A full night polysomnogram was performed to evaluate for -.  MedicationXYZAL Polysomnogram Data A full night polysomnogram recorded the standard physiologic parameters including EEG, EOG, EMG, EKG, nasal and oral airflow.  Respiratory parameters of chest and abdominal movements were recorded with Respiratory Inductance Plethysmography belts.   Oxygen saturation was recorded by pulse oximetry.  Sleep Architecture The total recording time of the polysomnogram was 366.9 minutes.  The total sleep time was 219.5 minutes.  The patient spent 4.8% of total sleep time in Stage N1, 87.0% in Stage N2, 0.0% in Stages N3, and 8.2% in REM.  Sleep latency was 35.1 minutes.   REM latency was 106.5 minutes.  Sleep Efficiency was 59.8%.  Wake after Sleep Onset time was 112.0 minutes.  Respiratory Events The polysomnogram revealed a presence of - obstructive, 1 central, and - mixed apneas resulting in an Apnea index of 0.3 events per hour.  There were 15 hypopneas (GreaterEqual to3% desaturation and/or arousal) resulting in an Apnea\Hypopnea Index (AHI  GreaterEqual to3% desaturation and/or arousal) of 4.4 events per hour.  There were 5 hypopneas (GreaterEqual to4% desaturation) resulting in an Apnea\Hypopnea Index (AHI GreaterEqual to4% desaturation) of 1.6 events per hour.  There were 65 Respiratory  Effort Related Arousals resulting in a RERA index of 17.8 events per hour. The Respiratory Disturbance Index is 22.1 events per hour.  The snore index was 542.3 events per hour.  Mean oxygen saturation was 97.0%.  The lowest oxygen saturation during sleep was 89.0%.  Time spent LessEqual to88% oxygen saturation was  minutes ().  Limb Activity There were 77 total limb movements recorded, of this total, 31 were classified as PLMs.  PLM index was 8.5 per hour and PLM associated with Arousals  index was 2.5 per hour.  Cardiac Summary The average pulse rate was 77.4 bpm.  The minimum pulse rate was 62.0 bpm while the maximum pulse rate was 104.0 bpm.  Cardiac rhythm was normal/abnormal.  Comments: Less than 4 hours of sleep recorded Could not go back to sleep when she woke up  Diagnosis: Negative study for significant sleep disordered breathing with an AHI of 4.4.  No significant oxygen desaturations, O2 nadir of 89%.  Saturations did not dip below 88% Mild periodic limb movement Cardiac rhythm was sinus Moderate to severe snoring noted Increased number of arousals related to snoring contributing to fragmented sleep  Recommendations: Clinical follow-up of symptoms Encourage weight loss measures Behavioral measures that may reduce snoring may include sleeping with the head of the bed elevated, ensuring side sleeping may help May consider an oral device for treatment of snoring  Follow-up as scheduled  This study was personally reviewed and electronically signed by: Jennet Epley, MD Accredited Board Certified in Sleep Medicine Date/Time:  01/25/24

## 2024-01-25 NOTE — Procedures (Signed)
 [facility] [address] [place], [place] [address] Tel: [phone]   Fax: [phone]  Polysomnography Interpretation  Patient Name:  NAZIAH, PORTEE Date:  01/23/2024 Referring Physician:  JENNET EPLEY [phone]) %%startinterp%% Indications for Polysomnography The patient is a 53 year-old Female who is 5' 7 and weighs 224.0 lbs. Her BMI equals 35.2.  A full night polysomnogram was performed to evaluate for -.  Medication  XYZAL    Polysomnogram Data A full night polysomnogram recorded the standard physiologic parameters including EEG, EOG, EMG, EKG, nasal and oral airflow.  Respiratory parameters of chest and abdominal movements were recorded with Respiratory Inductance Plethysmography belts.  Oxygen saturation was recorded by pulse oximetry.   Sleep Architecture The total recording time of the polysomnogram was 366.9 minutes.  The total sleep time was 219.5 minutes.  The patient spent 4.8% of total sleep time in Stage N1, 87.0% in Stage N2, 0.0% in Stages N3, and 8.2% in REM.  Sleep latency was 35.1 minutes.  REM latency was 106.5 minutes.  Sleep Efficiency was 59.8%.  Wake after Sleep Onset time was 112.0 minutes.  Respiratory Events The polysomnogram revealed a presence of - obstructive, 1 central, and - mixed apneas resulting in an Apnea index of 0.3 events per hour.  There were 15 hypopneas (>=3% desaturation and/or arousal) resulting in an Apnea\Hypopnea Index (AHI >=3% desaturation and/or arousal) of 4.4 events per hour.  There were 5 hypopneas (>=4% desaturation) resulting in an Apnea\Hypopnea Index (AHI >=4% desaturation) of 1.6 events per hour.  There were 65 Respiratory Effort Related Arousals resulting in a RERA index of 17.8 events per hour. The Respiratory Disturbance Index is 22.1 events per hour.  The snore index was 542.3 events per hour.  Mean oxygen saturation was 97.0%.  The lowest oxygen saturation during sleep was  89.0%.  Time spent <=88% oxygen saturation was - minutes (-).  Limb Activity There were 77 total limb movements recorded, of this total, 31 were classified as PLMs.  PLM index was 8.5 per hour and PLM associated with Arousals index was 2.5 per hour.  Cardiac Summary The average pulse rate was 77.4 bpm.  The minimum pulse rate was 62.0 bpm while the maximum pulse rate was 104.0 bpm.  Cardiac rhythm was normal/abnormal.  Comments:  Less than 4 hours of sleep recorded Could not go back to sleep when she woke up  Diagnosis:  Negative study for significant sleep disordered breathing with an AHI of 4.4.  No significant oxygen desaturations, O2 nadir of 89%.  Saturations did not dip below 88% Mild periodic limb movement Cardiac rhythm was sinus Moderate to severe snoring noted Increased number of arousals related to snoring contributing to fragmented sleep  Recommendations: Clinical follow-up of symptoms Encourage weight loss measures Behavioral measures that may reduce snoring may include sleeping with the head of the bed elevated, ensuring side sleeping may help  May consider an oral device for treatment of snoring  Follow-up as scheduled  This study was personally reviewed and electronically signed by: Jennet Epley, MD Accredited Board Certified in Sleep Medicine Date/Time:   01/25/24  %%endinterp%%   Diagnostic PSG Report  Patient Name: GREG, ECKRICH Date: 01/23/2024  Date of Birth: [birth year] Study Type: Diagnostic  Age: 75 year MRN #: [id number]  Sex: Female Interpreting Physician: EPLEY JENNET, [id number]  Height: 5' 7 Referring Physician: JENNET EPLEY [phone])  Weight: 224.0 lbs Recording Tech: Silvano Petite RPSGT RST  BMI: 35.2 Scoring Tech: [employer]  RPSGT RST  ESS: 12 Neck Size: 15   Study Overview  Lights Off: 09:54:57 PM  Count Index  Lights On: 04:01:53 AM Awakenings: 8 2.2  Time in Bed: 366.9 min. Arousals: 120 32.8  Total Sleep  Time: 219.5 min. AHI (>=3% Desat and/or Ar.): 16 4.4   Sleep Efficiency: 59.8% AHI (>=4% Desat): 6 1.6   Sleep Latency: 35.1 min. Limb Movements: 77 21.0  Wake After Sleep Onset: 112.0 min. Snore: 1984 542.3  REM Latency from Sleep Onset: 106.5 min. Desaturations: 19 5.2     Minimum SpO2 TST: 89.0%    Sleep Architecture  % of Time in Bed Stages Time (mins) % Sleep Time  Wake 148.0   Stage N1 10.5 4.8%  Stage N2 191.0 87.0%  Stage N3 0.0 0.0%  REM 18.0 8.2%   Arousal Summary   NREM REM Sleep Index  Respiratory Arousals 59 7 66 18.0  PLM Arousals 9 - 9 2.5  Isolated Limb Movement Arousals 12 1 13  3.6  Snore Arousals 26 2 28  7.7  Spontaneous Arousals 21 - 21 5.7  Total 111 9 120 32.8   Limb Movement Summary   Count Index  Isolated Limb Movements 46 12.6  Periodic Limb Movements (PLMs) 31 8.5  Total Limb Movements 77 21.0    Respiratory Summary   By Sleep Stage By Body Position Total   NREM REM Supine Non-Supine   Time (min) 201.5 18.0 2.5 217.0 219.5         Obstructive Apnea - - - - -  Mixed Apnea - - - - -  Central Apnea 1 - - 1 1  Total Apneas 1 - - 1 1  Total Apnea Index 0.3 - - 0.3 0.3         Hypopneas (>=3% Desat and/or Ar.) 6 9 - 15 15  AHI (>=3% Desat and/or Ar.) 2.1 30.0 - 4.4 4.4         Hypopneas (>=4% Desat) 1 4 - 5 5  AHI (>=4% Desat) 0.6 13.3 - 1.7 1.6          RERAs 58 7 - 65 65  RERA Index 17.3 23.3 - 18.0 17.8         RDI 19.4 53.3 - 22.4 22.1    Respiratory Event Type Index  Central Apneas 0.3  Obstructive Apneas -  Mixed Apneas -  Central Hypopneas -  Obstructive Hypopneas 4.1  Central Apnea + Hypopnea (CAHI) 0.3  Obstructive Apnea + Hypopnea (OAHI) 4.1   Respiratory Event Durations   Apnea Hypopnea   NREM REM NREM REM  Average (seconds) 13.0 - 16.6 16.5  Maximum (seconds) 13.0 - 21.2 27.0    Oxygen Saturation Summary   Wake NREM REM TST TIB  Average SpO2 (%) 97.1% 97.0% 95.9% 96.9% 97.0%  Minimum SpO2 (%) 93.0% 94.0%  89.0% 89.0% 89.0%  Maximum SpO2 (%) 99.0% 100.0% 98.0% 100.0% 100.0%   Oxygen Saturation Distribution  Range (%) Time in range (min) Time in range (%)  90.0 - 100.0 362.9 99.9%  80.0 - 90.0 0.4 0.1%  70.0 - 80.0 - -  60.0 - 70.0 - -  50.0 - 60.0 - -  0.0 - 50.0 - -  Time Spent <=88% SpO2  Range (%) Time in range (min) Time in range (%)  0.0 - 88.0 - -      Count Index  Desaturations 19 5.2    Cardiac Summary   Wake NREM REM Sleep Total  Average Pulse  Rate (BPM) 74.3 78.7 86.7 79.4 77.4  Minimum Pulse Rate (BPM) 62.0 62.0 64.0 62.0 62.0  Maximum Pulse Rate (BPM) 104.0 102.0 103.0 103.0 104.0   Pulse Rate Distribution:  Range (bpm) Time in range (min) Time in range (%)  0.0 - 40.0 - -  40.0 - 60.0 - -  60.0 - 80.0 227.8 62.7%  80.0 - 100.0 135.1 37.2%  100.0 - 120.0 0.3 0.1%  120.0 - 140.0 - -  140.0 - 200.0 - -      Hypnograms                      Technologist Comments  The patient is a 53 y/o female in the [facility] for a Diagnostic NPSG. The patient's associated dx is sleep apnea. The study was performed in sleep room #1.   The patient arrived without complaint. The study was explained and wires applied. The patient self administered 1 Xyzal  5 MG tablet at 2130.   The patient was observed sleeping lateral and supine. The patient demonstrated loud snoring, continuous and intermittent. Patient slept lateral more than supine. The patient demonstrated hot flashes throughout the night. The room was kept very cool. The patient reports dealing with this at home as well. Events were majority RERAs in NREM sleep. During REM sleep, the patient demonstrated hypopneas. The patient woke before 0300 and was awake for majority of end of the study. She states she wakes and cannot go back to sleep at home typically after 0300. She requested to get up when enough study was recorded.   Restroom visits: 1 ECG: Appeared to be Sinus Rhythm PLMs/PLMAs: occasional with  RERAs Snore: Loud- continuous and intermittent for majority of sleep. Moderate at times. No parasomnias were observed.  The patient c/o dry mouth and throat at the end of the study.

## 2024-01-27 ENCOUNTER — Ambulatory Visit: Payer: MEDICAID | Attending: Orthopedic Surgery

## 2024-01-27 NOTE — Telephone Encounter (Signed)
 Called and spoke with the pt and advised of Dr. Neda note.  Pt would like referral to orthodontist for oral device.  Order has been placed.

## 2024-01-28 ENCOUNTER — Ambulatory Visit (INDEPENDENT_AMBULATORY_CARE_PROVIDER_SITE_OTHER): Payer: MEDICAID | Admitting: Orthopedic Surgery

## 2024-01-28 DIAGNOSIS — Z9889 Other specified postprocedural states: Secondary | ICD-10-CM

## 2024-01-28 NOTE — Progress Notes (Signed)
   [name] - 53 y.o. female MRN [id number]  Date of birth: 1970/05/08  Office Visit Note: Visit Date: 01/28/2024 PCP: Elnor Lauraine BRAVO, NP Referred by: Elnor Lauraine BRAVO, NP  Subjective:  HPI: [name] is a 53 y.o. female who presents today for follow up 2 weeks status post left wrist endoscopic carpal tunnel release.  Doing well overall, does have some soreness throughout the hand and forearm region.  Has been unable to attend therapy thus far, says she has rescheduled.  Pertinent ROS were reviewed with the patient and found to be negative unless otherwise specified above in HPI.   Assessment & Plan: Visit Diagnoses:  1. S/P endoscopic carpal tunnel release     Plan: She is doing well overall.  Sutures removed today.  I did emphasize the importance of occupational therapy in the initial period to help move along with range of motion and strengthening.  She expressed understanding.  I will plan on seeing her back in approxi-1 month.  Follow-up: No follow-ups on file.   Meds & Orders: No orders of the defined types were placed in this encounter.  No orders of the defined types were placed in this encounter.    Procedures: No procedures performed       Objective:   Vital Signs: LMP 01/10/2024   Ortho Exam Left wrist with well-healed incision, sutures removed, skin well-approximated without erythema or drainage, sensation intact light touch in median nerve distribution, no significant thenar atrophy, thumb opposition is well-preserved, APB 5/5  Imaging: No results found.   Xavius Spadafore Afton Alderton, M.D. [facility], Hand Surgery

## 2024-01-31 ENCOUNTER — Encounter (HOSPITAL_COMMUNITY): Payer: Self-pay

## 2024-01-31 ENCOUNTER — Other Ambulatory Visit (HOSPITAL_COMMUNITY): Payer: Self-pay

## 2024-02-06 ENCOUNTER — Ambulatory Visit: Payer: MEDICAID | Admitting: Podiatry

## 2024-02-12 ENCOUNTER — Ambulatory Visit (HOSPITAL_BASED_OUTPATIENT_CLINIC_OR_DEPARTMENT_OTHER): Payer: MEDICAID | Admitting: Pulmonary Disease

## 2024-02-25 ENCOUNTER — Ambulatory Visit: Payer: MEDICAID | Admitting: Podiatry

## 2024-03-03 ENCOUNTER — Ambulatory Visit: Payer: MEDICAID

## 2024-03-23 ENCOUNTER — Encounter: Payer: Self-pay | Admitting: Nurse Practitioner

## 2024-03-24 ENCOUNTER — Other Ambulatory Visit (HOSPITAL_COMMUNITY): Payer: MEDICAID

## 2024-04-02 ENCOUNTER — Other Ambulatory Visit (HOSPITAL_COMMUNITY): Payer: Self-pay
